# Patient Record
Sex: Female | Born: 1940
Health system: Southern US, Community
[De-identification: ages and names within clinical notes are randomized; demographics above are authoritative.]

## PROBLEM LIST (undated history)

## (undated) DIAGNOSIS — Z9114 Patient's other noncompliance with medication regimen: Secondary | ICD-10-CM

## (undated) DIAGNOSIS — I10 Essential (primary) hypertension: Secondary | ICD-10-CM

## (undated) DIAGNOSIS — F419 Anxiety disorder, unspecified: Secondary | ICD-10-CM

## (undated) DIAGNOSIS — R110 Nausea: Secondary | ICD-10-CM

## (undated) DIAGNOSIS — J189 Pneumonia, unspecified organism: Secondary | ICD-10-CM

## (undated) DIAGNOSIS — Z789 Other specified health status: Secondary | ICD-10-CM

## (undated) DIAGNOSIS — F32A Depression, unspecified: Secondary | ICD-10-CM

## (undated) DIAGNOSIS — M1990 Unspecified osteoarthritis, unspecified site: Secondary | ICD-10-CM

## (undated) DIAGNOSIS — Z8701 Personal history of pneumonia (recurrent): Secondary | ICD-10-CM

## (undated) DIAGNOSIS — K5909 Other constipation: Secondary | ICD-10-CM

## (undated) DIAGNOSIS — R0602 Shortness of breath: Secondary | ICD-10-CM

## (undated) DIAGNOSIS — G8929 Other chronic pain: Secondary | ICD-10-CM

## (undated) DIAGNOSIS — K219 Gastro-esophageal reflux disease without esophagitis: Secondary | ICD-10-CM

## (undated) DIAGNOSIS — W3400XA Accidental discharge from unspecified firearms or gun, initial encounter: Secondary | ICD-10-CM

## (undated) DIAGNOSIS — R109 Unspecified abdominal pain: Secondary | ICD-10-CM

## (undated) DIAGNOSIS — E785 Hyperlipidemia, unspecified: Secondary | ICD-10-CM

## (undated) DIAGNOSIS — Z91148 Patient's other noncompliance with medication regimen for other reason: Secondary | ICD-10-CM

## (undated) DIAGNOSIS — E119 Type 2 diabetes mellitus without complications: Secondary | ICD-10-CM

## (undated) DIAGNOSIS — R7303 Prediabetes: Secondary | ICD-10-CM

## (undated) DIAGNOSIS — F329 Major depressive disorder, single episode, unspecified: Secondary | ICD-10-CM

## (undated) DIAGNOSIS — M549 Dorsalgia, unspecified: Secondary | ICD-10-CM

## (undated) DIAGNOSIS — E7800 Pure hypercholesterolemia, unspecified: Secondary | ICD-10-CM

## (undated) HISTORY — PX: BREAST SURGERY: SHX581

## (undated) HISTORY — DX: Essential (primary) hypertension: I10

## (undated) HISTORY — PX: TOE SURGERY: SHX1073

## (undated) HISTORY — DX: Personal history of pneumonia (recurrent): Z87.01

## (undated) HISTORY — PX: CARDIAC CATHETERIZATION: SHX172

## (undated) HISTORY — DX: Hyperlipidemia, unspecified: E78.5

## (undated) HISTORY — DX: Prediabetes: R73.03

## (undated) HISTORY — DX: Type 2 diabetes mellitus without complications: E11.9

## (undated) HISTORY — PX: ABDOMINAL HYSTERECTOMY: SHX81

## (undated) HISTORY — PX: DILATION AND CURETTAGE OF UTERUS: SHX78

---

## 2000-10-29 ENCOUNTER — Ambulatory Visit (HOSPITAL_COMMUNITY): Admission: RE | Admit: 2000-10-29 | Discharge: 2000-10-29 | Payer: Self-pay | Admitting: Family Medicine

## 2000-10-29 ENCOUNTER — Encounter: Payer: Self-pay | Admitting: Family Medicine

## 2001-02-09 ENCOUNTER — Emergency Department (HOSPITAL_COMMUNITY): Admission: EM | Admit: 2001-02-09 | Discharge: 2001-02-09 | Payer: Self-pay | Admitting: *Deleted

## 2001-06-06 ENCOUNTER — Encounter: Payer: Self-pay | Admitting: Family Medicine

## 2001-06-06 ENCOUNTER — Ambulatory Visit (HOSPITAL_COMMUNITY): Admission: RE | Admit: 2001-06-06 | Discharge: 2001-06-06 | Payer: Self-pay | Admitting: Family Medicine

## 2001-08-11 ENCOUNTER — Ambulatory Visit (HOSPITAL_COMMUNITY): Admission: RE | Admit: 2001-08-11 | Discharge: 2001-08-11 | Payer: Self-pay | Admitting: Family Medicine

## 2001-08-11 ENCOUNTER — Encounter: Payer: Self-pay | Admitting: Family Medicine

## 2002-08-13 ENCOUNTER — Ambulatory Visit (HOSPITAL_COMMUNITY): Admission: RE | Admit: 2002-08-13 | Discharge: 2002-08-13 | Payer: Self-pay | Admitting: Family Medicine

## 2002-08-13 ENCOUNTER — Encounter: Payer: Self-pay | Admitting: Family Medicine

## 2003-08-16 ENCOUNTER — Ambulatory Visit (HOSPITAL_COMMUNITY): Admission: RE | Admit: 2003-08-16 | Discharge: 2003-08-16 | Payer: Self-pay | Admitting: Family Medicine

## 2004-04-28 ENCOUNTER — Emergency Department (HOSPITAL_COMMUNITY): Admission: EM | Admit: 2004-04-28 | Discharge: 2004-04-28 | Payer: Self-pay | Admitting: Emergency Medicine

## 2004-05-21 ENCOUNTER — Emergency Department (HOSPITAL_COMMUNITY): Admission: EM | Admit: 2004-05-21 | Discharge: 2004-05-22 | Payer: Self-pay | Admitting: Emergency Medicine

## 2004-05-22 ENCOUNTER — Emergency Department (HOSPITAL_COMMUNITY): Admission: EM | Admit: 2004-05-22 | Discharge: 2004-05-22 | Payer: Self-pay | Admitting: Emergency Medicine

## 2004-10-16 ENCOUNTER — Ambulatory Visit (HOSPITAL_COMMUNITY): Admission: RE | Admit: 2004-10-16 | Discharge: 2004-10-16 | Payer: Self-pay | Admitting: Family Medicine

## 2005-03-27 ENCOUNTER — Emergency Department (HOSPITAL_COMMUNITY): Admission: EM | Admit: 2005-03-27 | Discharge: 2005-03-27 | Payer: Self-pay | Admitting: Emergency Medicine

## 2005-06-05 ENCOUNTER — Emergency Department (HOSPITAL_COMMUNITY): Admission: EM | Admit: 2005-06-05 | Discharge: 2005-06-05 | Payer: Self-pay | Admitting: Emergency Medicine

## 2005-06-21 ENCOUNTER — Encounter (HOSPITAL_COMMUNITY): Admission: RE | Admit: 2005-06-21 | Discharge: 2005-06-29 | Payer: Self-pay | Admitting: Family Medicine

## 2005-07-03 ENCOUNTER — Encounter (HOSPITAL_COMMUNITY): Admission: RE | Admit: 2005-07-03 | Discharge: 2005-07-23 | Payer: Self-pay | Admitting: Family Medicine

## 2005-10-22 ENCOUNTER — Ambulatory Visit (HOSPITAL_COMMUNITY): Admission: RE | Admit: 2005-10-22 | Discharge: 2005-10-22 | Payer: Self-pay | Admitting: Family Medicine

## 2006-02-24 ENCOUNTER — Emergency Department (HOSPITAL_COMMUNITY): Admission: EM | Admit: 2006-02-24 | Discharge: 2006-02-24 | Payer: Self-pay | Admitting: Emergency Medicine

## 2006-09-11 ENCOUNTER — Emergency Department (HOSPITAL_COMMUNITY): Admission: EM | Admit: 2006-09-11 | Discharge: 2006-09-11 | Payer: Self-pay | Admitting: Emergency Medicine

## 2006-10-25 ENCOUNTER — Ambulatory Visit (HOSPITAL_COMMUNITY): Admission: RE | Admit: 2006-10-25 | Discharge: 2006-10-25 | Payer: Self-pay | Admitting: Family Medicine

## 2007-04-16 ENCOUNTER — Ambulatory Visit (HOSPITAL_COMMUNITY): Admission: RE | Admit: 2007-04-16 | Discharge: 2007-04-16 | Payer: Self-pay | Admitting: Internal Medicine

## 2007-04-16 ENCOUNTER — Ambulatory Visit: Payer: Self-pay | Admitting: Internal Medicine

## 2007-04-16 HISTORY — PX: COLONOSCOPY: SHX174

## 2007-05-10 ENCOUNTER — Ambulatory Visit (HOSPITAL_COMMUNITY): Admission: RE | Admit: 2007-05-10 | Discharge: 2007-05-10 | Payer: Self-pay | Admitting: Family Medicine

## 2007-05-16 ENCOUNTER — Emergency Department (HOSPITAL_COMMUNITY): Admission: EM | Admit: 2007-05-16 | Discharge: 2007-05-16 | Payer: Self-pay | Admitting: Emergency Medicine

## 2007-05-30 ENCOUNTER — Emergency Department (HOSPITAL_COMMUNITY): Admission: EM | Admit: 2007-05-30 | Discharge: 2007-05-30 | Payer: Self-pay | Admitting: Emergency Medicine

## 2007-07-26 ENCOUNTER — Emergency Department (HOSPITAL_COMMUNITY): Admission: EM | Admit: 2007-07-26 | Discharge: 2007-07-26 | Payer: Self-pay | Admitting: Emergency Medicine

## 2007-11-11 ENCOUNTER — Ambulatory Visit (HOSPITAL_COMMUNITY): Admission: RE | Admit: 2007-11-11 | Discharge: 2007-11-11 | Payer: Self-pay | Admitting: Family Medicine

## 2008-04-02 ENCOUNTER — Emergency Department (HOSPITAL_COMMUNITY): Admission: EM | Admit: 2008-04-02 | Discharge: 2008-04-02 | Payer: Self-pay | Admitting: Emergency Medicine

## 2008-04-10 ENCOUNTER — Emergency Department (HOSPITAL_COMMUNITY): Admission: EM | Admit: 2008-04-10 | Discharge: 2008-04-10 | Payer: Self-pay | Admitting: Emergency Medicine

## 2008-10-14 ENCOUNTER — Emergency Department (HOSPITAL_COMMUNITY): Admission: EM | Admit: 2008-10-14 | Discharge: 2008-10-14 | Payer: Self-pay | Admitting: Emergency Medicine

## 2008-12-08 ENCOUNTER — Ambulatory Visit (HOSPITAL_COMMUNITY): Admission: RE | Admit: 2008-12-08 | Discharge: 2008-12-08 | Payer: Self-pay | Admitting: Family Medicine

## 2009-06-09 ENCOUNTER — Emergency Department (HOSPITAL_COMMUNITY): Admission: EM | Admit: 2009-06-09 | Discharge: 2009-06-09 | Payer: Self-pay | Admitting: Emergency Medicine

## 2009-10-25 ENCOUNTER — Emergency Department (HOSPITAL_COMMUNITY): Admission: EM | Admit: 2009-10-25 | Discharge: 2009-10-25 | Payer: Self-pay | Admitting: Emergency Medicine

## 2009-12-13 ENCOUNTER — Ambulatory Visit (HOSPITAL_COMMUNITY): Admission: RE | Admit: 2009-12-13 | Discharge: 2009-12-13 | Payer: Self-pay | Admitting: Family Medicine

## 2010-04-20 ENCOUNTER — Ambulatory Visit (HOSPITAL_COMMUNITY): Admission: RE | Admit: 2010-04-20 | Discharge: 2010-04-20 | Payer: Self-pay | Admitting: Podiatry

## 2010-07-16 ENCOUNTER — Inpatient Hospital Stay (HOSPITAL_COMMUNITY): Admission: EM | Admit: 2010-07-16 | Discharge: 2010-07-18 | Payer: Self-pay | Source: Home / Self Care

## 2010-07-17 ENCOUNTER — Encounter (INDEPENDENT_AMBULATORY_CARE_PROVIDER_SITE_OTHER): Payer: Self-pay | Admitting: Internal Medicine

## 2010-07-17 LAB — DIFFERENTIAL
Basophils Absolute: 0 10*3/uL (ref 0.0–0.1)
Basophils Relative: 1 % (ref 0–1)
Eosinophils Absolute: 0.1 10*3/uL (ref 0.0–0.7)
Eosinophils Relative: 2 % (ref 0–5)
Lymphocytes Relative: 28 % (ref 12–46)
Lymphs Abs: 1.3 10*3/uL (ref 0.7–4.0)
Monocytes Absolute: 0.3 10*3/uL (ref 0.1–1.0)
Monocytes Relative: 6 % (ref 3–12)
Neutro Abs: 2.9 10*3/uL (ref 1.7–7.7)
Neutrophils Relative %: 63 % (ref 43–77)

## 2010-07-17 LAB — CBC
HCT: 35.4 % — ABNORMAL LOW (ref 36.0–46.0)
Hemoglobin: 12.1 g/dL (ref 12.0–15.0)
MCH: 30.5 pg (ref 26.0–34.0)
MCHC: 34.2 g/dL (ref 30.0–36.0)
MCV: 89.2 fL (ref 78.0–100.0)
Platelets: 477 10*3/uL — ABNORMAL HIGH (ref 150–400)
RBC: 3.97 MIL/uL (ref 3.87–5.11)
RDW: 13.1 % (ref 11.5–15.5)
WBC: 4.6 10*3/uL (ref 4.0–10.5)

## 2010-07-17 LAB — URINALYSIS, ROUTINE W REFLEX MICROSCOPIC
Bilirubin Urine: NEGATIVE
Ketones, ur: NEGATIVE mg/dL
Leukocytes, UA: NEGATIVE
Nitrite: NEGATIVE
Protein, ur: NEGATIVE mg/dL
Specific Gravity, Urine: 1.015 (ref 1.005–1.030)
Urine Glucose, Fasting: NEGATIVE mg/dL
Urobilinogen, UA: 0.2 mg/dL (ref 0.0–1.0)
pH: 7 (ref 5.0–8.0)

## 2010-07-17 LAB — BASIC METABOLIC PANEL
BUN: 16 mg/dL (ref 6–23)
CO2: 20 mEq/L (ref 19–32)
Calcium: 9 mg/dL (ref 8.4–10.5)
Chloride: 105 mEq/L (ref 96–112)
Creatinine, Ser: 1.12 mg/dL (ref 0.4–1.2)
GFR calc Af Amer: 59 mL/min — ABNORMAL LOW (ref >60)
GFR calc non Af Amer: 49 mL/min — ABNORMAL LOW (ref >60)
Glucose, Bld: 134 mg/dL — ABNORMAL HIGH (ref 70–99)
Potassium: 3.1 mEq/L — ABNORMAL LOW (ref 3.5–5.1)
Sodium: 139 mEq/L (ref 135–145)

## 2010-07-17 LAB — URINE MICROSCOPIC-ADD ON

## 2010-07-17 LAB — POCT CARDIAC MARKERS
CKMB, poc: <1 ng/mL — ABNORMAL LOW (ref 1.0–8.0)
Myoglobin, poc: 120 ng/mL (ref 12–200)
Troponin i, poc: <0.05 ng/mL (ref 0.00–0.09)

## 2010-07-17 LAB — D-DIMER, QUANTITATIVE: D-Dimer, Quant: 1.31 ug/mL-FEU — ABNORMAL HIGH (ref 0.00–0.48)

## 2010-07-19 LAB — VITAMIN B12: Vitamin B-12: 791 pg/mL (ref 211–911)

## 2010-07-19 LAB — MRSA PCR SCREENING: MRSA by PCR: POSITIVE — AB

## 2010-07-19 LAB — IRON AND TIBC
Iron: 72 ug/dL (ref 42–135)
Saturation Ratios: 27 % (ref 20–55)
TIBC: 271 ug/dL (ref 250–470)
UIBC: 199 ug/dL

## 2010-07-19 LAB — DIFFERENTIAL
Basophils Absolute: 0.1 10*3/uL (ref 0.0–0.1)
Basophils Relative: 1 % (ref 0–1)
Eosinophils Absolute: 0.2 10*3/uL (ref 0.0–0.7)
Eosinophils Relative: 3 % (ref 0–5)
Lymphocytes Relative: 45 % (ref 12–46)
Lymphs Abs: 2.5 10*3/uL (ref 0.7–4.0)
Monocytes Absolute: 0.6 10*3/uL (ref 0.1–1.0)
Monocytes Relative: 11 % (ref 3–12)
Neutro Abs: 2.2 10*3/uL (ref 1.7–7.7)
Neutrophils Relative %: 40 % — ABNORMAL LOW (ref 43–77)

## 2010-07-19 LAB — URINE CULTURE
Colony Count: NO GROWTH
Culture  Setup Time: 201201151949
Culture: NO GROWTH

## 2010-07-19 LAB — CBC
HCT: 32.2 % — ABNORMAL LOW (ref 36.0–46.0)
Hemoglobin: 11 g/dL — ABNORMAL LOW (ref 12.0–15.0)
MCH: 30.4 pg (ref 26.0–34.0)
MCHC: 34.2 g/dL (ref 30.0–36.0)
MCV: 89 fL (ref 78.0–100.0)
Platelets: 449 10*3/uL — ABNORMAL HIGH (ref 150–400)
RBC: 3.62 MIL/uL — ABNORMAL LOW (ref 3.87–5.11)
RDW: 13 % (ref 11.5–15.5)
WBC: 5.6 10*3/uL (ref 4.0–10.5)

## 2010-07-19 LAB — FERRITIN: Ferritin: 146 ng/mL (ref 10–291)

## 2010-07-19 LAB — FOLATE: Folate: 17.2 ng/mL

## 2010-07-19 LAB — TSH: TSH: 2.821 u[IU]/mL (ref 0.350–4.500)

## 2010-07-24 ENCOUNTER — Encounter: Payer: Self-pay | Admitting: Family Medicine

## 2010-09-13 LAB — BASIC METABOLIC PANEL
BUN: 13 mg/dL (ref 6–23)
CO2: 24 mEq/L (ref 19–32)
Calcium: 9.7 mg/dL (ref 8.4–10.5)
Chloride: 109 mEq/L (ref 96–112)
Creatinine, Ser: 1.42 mg/dL — ABNORMAL HIGH (ref 0.4–1.2)
GFR calc Af Amer: 45 mL/min — ABNORMAL LOW (ref >60)
GFR calc non Af Amer: 37 mL/min — ABNORMAL LOW (ref >60)
Glucose, Bld: 136 mg/dL — ABNORMAL HIGH (ref 70–99)
Potassium: 3.8 mEq/L (ref 3.5–5.1)
Sodium: 140 mEq/L (ref 135–145)

## 2010-09-13 LAB — HEMOGLOBIN AND HEMATOCRIT, BLOOD
HCT: 36.9 % (ref 36.0–46.0)
Hemoglobin: 12.7 g/dL (ref 12.0–15.0)

## 2010-09-13 LAB — SURGICAL PCR SCREEN: Staphylococcus aureus: POSITIVE — AB

## 2010-09-13 LAB — GLUCOSE, CAPILLARY: Glucose-Capillary: 93 mg/dL (ref 70–99)

## 2010-09-19 LAB — DIFFERENTIAL
Basophils Absolute: 0 10*3/uL (ref 0.0–0.1)
Basophils Relative: 1 % (ref 0–1)
Eosinophils Absolute: 0.1 10*3/uL (ref 0.0–0.7)
Eosinophils Relative: 1 % (ref 0–5)
Lymphocytes Relative: 34 % (ref 12–46)
Lymphs Abs: 2 10*3/uL (ref 0.7–4.0)
Monocytes Absolute: 0.6 10*3/uL (ref 0.1–1.0)
Monocytes Relative: 10 % (ref 3–12)
Neutro Abs: 3.2 10*3/uL (ref 1.7–7.7)
Neutrophils Relative %: 54 % (ref 43–77)

## 2010-09-19 LAB — POCT CARDIAC MARKERS
CKMB, poc: <1 ng/mL — ABNORMAL LOW (ref 1.0–8.0)
CKMB, poc: <1 ng/mL — ABNORMAL LOW (ref 1.0–8.0)
Myoglobin, poc: 62.9 ng/mL (ref 12–200)
Myoglobin, poc: 68.2 ng/mL (ref 12–200)
Troponin i, poc: <0.05 ng/mL (ref 0.00–0.09)
Troponin i, poc: <0.05 ng/mL (ref 0.00–0.09)

## 2010-09-19 LAB — CBC
HCT: 36.7 % (ref 36.0–46.0)
Hemoglobin: 13 g/dL (ref 12.0–15.0)
MCHC: 35.5 g/dL (ref 30.0–36.0)
MCV: 89.7 fL (ref 78.0–100.0)
Platelets: 414 10*3/uL — ABNORMAL HIGH (ref 150–400)
RBC: 4.09 MIL/uL (ref 3.87–5.11)
RDW: 13.1 % (ref 11.5–15.5)
WBC: 5.9 10*3/uL (ref 4.0–10.5)

## 2010-09-19 LAB — URINALYSIS, ROUTINE W REFLEX MICROSCOPIC
Bilirubin Urine: NEGATIVE
Glucose, UA: NEGATIVE mg/dL
Hgb urine dipstick: NEGATIVE
Ketones, ur: NEGATIVE mg/dL
Nitrite: NEGATIVE
Protein, ur: NEGATIVE mg/dL
Specific Gravity, Urine: 1.02 (ref 1.005–1.030)
Urobilinogen, UA: 1 mg/dL (ref 0.0–1.0)
pH: 7.5 (ref 5.0–8.0)

## 2010-09-19 LAB — BASIC METABOLIC PANEL
BUN: 14 mg/dL (ref 6–23)
CO2: 25 mEq/L (ref 19–32)
Calcium: 10.3 mg/dL (ref 8.4–10.5)
Chloride: 109 mEq/L (ref 96–112)
Creatinine, Ser: 1.32 mg/dL — ABNORMAL HIGH (ref 0.4–1.2)
GFR calc Af Amer: 49 mL/min — ABNORMAL LOW (ref >60)
GFR calc non Af Amer: 40 mL/min — ABNORMAL LOW (ref >60)
Glucose, Bld: 91 mg/dL (ref 70–99)
Potassium: 4.3 mEq/L (ref 3.5–5.1)
Sodium: 141 mEq/L (ref 135–145)

## 2010-09-19 LAB — D-DIMER, QUANTITATIVE (NOT AT ARMC): D-Dimer, Quant: 0.68 ug/mL-FEU — ABNORMAL HIGH (ref 0.00–0.48)

## 2010-10-03 LAB — URINALYSIS, ROUTINE W REFLEX MICROSCOPIC
Glucose, UA: NEGATIVE mg/dL
Hgb urine dipstick: NEGATIVE
Leukocytes, UA: NEGATIVE
Nitrite: NEGATIVE
Specific Gravity, Urine: 1.025 (ref 1.005–1.030)
Urobilinogen, UA: 1 mg/dL (ref 0.0–1.0)
pH: 5.5 (ref 5.0–8.0)

## 2010-10-03 LAB — URINE MICROSCOPIC-ADD ON

## 2010-10-11 LAB — COMPREHENSIVE METABOLIC PANEL
ALT: 25 U/L (ref 0–35)
AST: 28 U/L (ref 0–37)
Albumin: 3.6 g/dL (ref 3.5–5.2)
Alkaline Phosphatase: 73 U/L (ref 39–117)
BUN: 16 mg/dL (ref 6–23)
CO2: 26 mEq/L (ref 19–32)
Calcium: 10 mg/dL (ref 8.4–10.5)
Chloride: 107 mEq/L (ref 96–112)
Creatinine, Ser: 1.27 mg/dL — ABNORMAL HIGH (ref 0.4–1.2)
GFR calc Af Amer: 51 mL/min — ABNORMAL LOW (ref >60)
GFR calc non Af Amer: 42 mL/min — ABNORMAL LOW (ref >60)
Glucose, Bld: 99 mg/dL (ref 70–99)
Potassium: 4.4 mEq/L (ref 3.5–5.1)
Sodium: 143 mEq/L (ref 135–145)
Total Bilirubin: 0.7 mg/dL (ref 0.3–1.2)
Total Protein: 6.7 g/dL (ref 6.0–8.3)

## 2010-10-11 LAB — DIFFERENTIAL
Basophils Absolute: 0.2 10*3/uL — ABNORMAL HIGH (ref 0.0–0.1)
Basophils Relative: 3 % — ABNORMAL HIGH (ref 0–1)
Eosinophils Absolute: 0.1 10*3/uL (ref 0.0–0.7)
Eosinophils Relative: 2 % (ref 0–5)
Lymphocytes Relative: 35 % (ref 12–46)
Lymphs Abs: 1.8 10*3/uL (ref 0.7–4.0)
Monocytes Absolute: 0.5 10*3/uL (ref 0.1–1.0)
Monocytes Relative: 10 % (ref 3–12)
Neutro Abs: 2.6 10*3/uL (ref 1.7–7.7)
Neutrophils Relative %: 50 % (ref 43–77)

## 2010-10-11 LAB — CBC
HCT: 36.3 % (ref 36.0–46.0)
Hemoglobin: 12.7 g/dL (ref 12.0–15.0)
MCHC: 35 g/dL (ref 30.0–36.0)
MCV: 89.7 fL (ref 78.0–100.0)
Platelets: 373 10*3/uL (ref 150–400)
RBC: 4.05 MIL/uL (ref 3.87–5.11)
RDW: 13.6 % (ref 11.5–15.5)
WBC: 5.3 10*3/uL (ref 4.0–10.5)

## 2010-10-11 LAB — D-DIMER, QUANTITATIVE: D-Dimer, Quant: 0.5 ug/mL-FEU — ABNORMAL HIGH (ref 0.00–0.48)

## 2010-10-11 LAB — POCT CARDIAC MARKERS
CKMB, poc: <1 ng/mL — ABNORMAL LOW (ref 1.0–8.0)
Myoglobin, poc: 62.8 ng/mL (ref 12–200)
Troponin i, poc: <0.05 ng/mL (ref 0.00–0.09)

## 2010-11-14 NOTE — Op Note (Signed)
NAMEYETZALI, WELD           ACCOUNT NO.:  [id number]   MEDICAL RECORD NO.:  [id number]          PATIENT TYPE:  AMB   LOCATION:  DAY                           FACILITY:  [facility]   PHYSICIAN:  R. Roetta Sessions, M.D. DATE OF BIRTH:  Feb 16, 1941   DATE OF PROCEDURE:  04/16/2007  DATE OF DISCHARGE:                               OPERATIVE REPORT   PROCEDURE:  Screening colonoscopy.   INDICATIONS FOR PROCEDURE:  A 70 year old African-American female with  no lower GI tract symptoms who comes to see me for screening colonoscopy  at the insistence of her granddaughter.  She, again, has no lower GI  tract symptoms.  There is no family history of colorectal hyperplasia  and she has never had her lower GI tract imaged.  Colonoscopy is now  being done as standard screening maneuver.  This approach has been  discussed the patient at length.  Potential risks, benefits and  alternatives have been reviewed.  The patient's questions were answered.  She is agreeable.   PROCEDURE NOTE:  O2 saturation, blood pressure, pulse and respirations  were monitored throughout the entire procedure.  Conscious sedation:  Versed 3 mg IV, Demerol 75 mg IV in divided doses.  Instrument:  Pentax  video chip system.   FINDINGS:  Digital rectal exam revealed no abnormalities.  The prep was  good.  Colon:  Colonic mucosa was surveyed from rectosigmoid junction  through the left transverse right colon to the appendiceal orifice,  ileocecal valve and cecum.  These structures were well seen,  photographed for the record.  From this level, scope was slowly and  cautiously withdrawn.  All previously mentioned mucosal surfaces were  again seen.  Utilizing tip deflection and fold flattening, all mucosal  surfaces of the colon were seen and appeared normal.  Scope was pulled  down into the rectum where thorough examination of the rectal mucosa,  retroflexed view of the anal verge demonstrated no abnormalities.  The  patient tolerated the procedure well and was reactive after endoscopy.   IMPRESSION:  1. Normal rectum.  2. Normal colon.   RECOMMENDATIONS:  Repeat screening colonoscopy 10 years.      Jonathon Bellows, M.D.  Electronically Signed     RMR/MEDQ  D:  04/16/2007  T:  04/16/2007  Job:  [id number]   cc:   Melvyn Novas, MD  Fax: [phone]

## 2010-12-11 ENCOUNTER — Other Ambulatory Visit (HOSPITAL_COMMUNITY): Payer: Self-pay | Admitting: Family Medicine

## 2010-12-11 DIAGNOSIS — Z139 Encounter for screening, unspecified: Secondary | ICD-10-CM

## 2011-01-29 ENCOUNTER — Ambulatory Visit (HOSPITAL_COMMUNITY)
Admission: RE | Admit: 2011-01-29 | Discharge: 2011-01-29 | Disposition: A | Discharge status: Home / Self Care | Payer: Medicare Other | Source: Ambulatory Visit | Attending: Family Medicine | Admitting: Family Medicine

## 2011-01-29 DIAGNOSIS — Z1231 Encounter for screening mammogram for malignant neoplasm of breast: Secondary | ICD-10-CM | POA: Insufficient documentation

## 2011-01-29 DIAGNOSIS — Z139 Encounter for screening, unspecified: Secondary | ICD-10-CM

## 2011-03-09 ENCOUNTER — Emergency Department (HOSPITAL_COMMUNITY)
Admission: EM | Admit: 2011-03-09 | Discharge: 2011-03-09 | Disposition: A | Discharge status: Home / Self Care | Payer: Medicare Other | Attending: Emergency Medicine | Admitting: Emergency Medicine

## 2011-03-09 ENCOUNTER — Encounter: Payer: Self-pay | Admitting: *Deleted

## 2011-03-09 ENCOUNTER — Emergency Department (HOSPITAL_COMMUNITY): Payer: Medicare Other

## 2011-03-09 DIAGNOSIS — I10 Essential (primary) hypertension: Secondary | ICD-10-CM | POA: Insufficient documentation

## 2011-03-09 DIAGNOSIS — M545 Low back pain, unspecified: Secondary | ICD-10-CM | POA: Insufficient documentation

## 2011-03-09 DIAGNOSIS — E7800 Pure hypercholesterolemia, unspecified: Secondary | ICD-10-CM | POA: Insufficient documentation

## 2011-03-09 DIAGNOSIS — F411 Generalized anxiety disorder: Secondary | ICD-10-CM | POA: Insufficient documentation

## 2011-03-09 HISTORY — DX: Other chronic pain: G89.29

## 2011-03-09 HISTORY — DX: Anxiety disorder, unspecified: F41.9

## 2011-03-09 HISTORY — DX: Essential (primary) hypertension: I10

## 2011-03-09 HISTORY — DX: Pure hypercholesterolemia, unspecified: E78.00

## 2011-03-09 HISTORY — DX: Dorsalgia, unspecified: M54.9

## 2011-03-09 MED ORDER — HYDROCODONE-ACETAMINOPHEN 5-325 MG PO TABS: ORAL_TABLET | ORAL | Status: AC

## 2011-03-09 MED ORDER — METHOCARBAMOL 500 MG PO TABS: 1000.0000 mg | ORAL_TABLET | Freq: Four times a day (QID) | ORAL | Status: AC | PRN

## 2011-03-09 NOTE — ED Provider Notes (Signed)
Scribed for Laray Anger, DO, the patient was seen in room APA17. . This chart was scribed by Ellie Lunch. This patient's care was started at 8:47 AM.   CSN: [id number] Arrival date & time: 03/09/2011  8:45 AM  Chief Complaint  Patient presents with  . Back Pain   HPI Patient was seen at 8:50. Audrey Johnson is a 70 y.o. female with a history of "back problems" presents to the Emergency Department complaining of gradual onset and persistence of constant left sided lower back "pain" starting 2 weeks ago. Pain described as a constant, aching pain that is aggravated by movement. Pt denies falling or any recent injury to her back. Pt went to her PCP Dr. Loleta Chance one week ago and reports he gave her a prescription  that has not helped. She is not sure what the prescribed medicine is. Pt has a follow up appointment in 4 weeks. There are no other associated symptoms and no other alleviating or aggravating factors.  Denies fevers, no abd pain, no N/V/D, no rash, no CP/SOB, no focal motor weakness, no tingling/numbness in extremities, no flank pain, no saddle anesthesia, no incont/retention of bowel or bladder, no dysuria/hematuria.     Past Medical History  Diagnosis Date  . Hypertension   . High cholesterol   . Chronic back pain   . Anxiety     Past Surgical History  Procedure Date  . Abdominal hysterectomy   . Toe surgery    MEDICATIONS:  Previous Medications   OLMESARTAN-HYDROCHLOROTHIAZIDE (BENICAR HCT) 40-25 MG PER TABLET    Take 1 tablet by mouth daily.     PRAVASTATIN (PRAVACHOL) 20 MG TABLET    Take 20 mg by mouth daily.       ALLERGIES:  Allergies as of 03/09/2011  . (No Known Allergies)    History  Substance Use Topics  . Smoking status: Never Smoker   . Smokeless tobacco: Not on file  . Alcohol Use: No    Review of Systems ROS: Statement: All systems negative except as marked or noted in the HPI; Constitutional: Negative for fever and chills. ; ; Eyes: Negative  for eye pain, redness and discharge. ; ; ENMT: Negative for ear pain, hoarseness, nasal congestion, sinus pressure and sore throat. ; ; Cardiovascular: Negative for chest pain, palpitations, diaphoresis, dyspnea and peripheral edema. ; ; Respiratory: Negative for cough, wheezing and stridor. ; ; Gastrointestinal: Negative for nausea, vomiting, diarrhea and abdominal pain, blood in stool, hematemesis, jaundice and rectal bleeding. . ; ; Genitourinary: Negative for dysuria, flank pain and hematuria. ; ; Musculoskeletal: +LBP.  Negative for neck pain. Negative for swelling and trauma.; ; Skin: Negative for pruritus, rash, abrasions, blisters, bruising and skin lesion.; ; Neuro: Negative for headache, lightheadedness and neck stiffness. Negative for weakness, altered level of consciousness , altered mental status, extremity weakness, paresthesias, involuntary movement, seizure and syncope.    Physical Exam  BP 119/61  Pulse 83  Temp(Src) 98.6 F (37 C) (Oral)  Resp 20  Ht 5\' 5"  (1.651 m)  Wt 160 lb (72.576 kg)  BMI 26.63 kg/m2  SpO2 99%  Physical Exam 0855: Physical examination:  Nursing notes reviewed; Vital signs and O2 SAT reviewed;  Constitutional: Well developed, Well nourished, Well hydrated, In no acute distress; Head:  Normocephalic, atraumatic; Eyes: EOMI, PERRL, No scleral icterus; ENMT: Mouth and pharynx normal, Mucous membranes moist; Neck: Supple, Full range of motion, No lymphadenopathy; Cardiovascular: Regular rate and rhythm, No murmur, rub, or gallop;  Respiratory: Breath sounds clear & equal bilaterally, No rales, rhonchi, wheezes, or rub, Normal respiratory effort/excursion; Chest: Nontender, Movement normal; Abdomen: Soft, Nontender, Nondistended, Normal bowel sounds; Genitourinary: No CVA tenderness; Extremities: Pulses normal, No tenderness, No edema, No calf edema or asymmetry.; Neuro: AA&Ox3, Major CN grossly intact.  Strength 5/5 equal bilat UE's and LE's, including great toe  dorsiflexion.  DTR 2/4 equal bilat UE's and LE's.  No gross sensory deficits.  Neg straight leg raises bilat.  Gait steady.  Skin: Color normal, Warm, Dry.  Spine:  No midline CS, TS, LS tenderness.  +TTP left lower lumbar paraspinal muscles.    ED Course  Procedures  MDM MDM Reviewed: nursing note and vitals Interpretation: x-ray   Dg Lumbar Spine Complete  03/09/2011  *RADIOLOGY REPORT*  Clinical Data: 70 year old female with low back pain times 2 weeks.  LUMBAR SPINE - COMPLETE 4+ VIEW  Comparison: Lumbar MRI 05/10/2007.  Lumbar radiographs 06/05/2005.  Findings: Normal lumbar segmentation.  Stable vertebral height and alignment.  Chronic right gonadal vein phlebolith is prominent and projects over the right L5 transverse process.  No pars fracture. Severe disc degeneration at L1-L2, now with vacuum disc phenomena. Stable and relatively preserved disc spaces elsewhere.  Moderate L5- S1 facet hypertrophy greater on the right.  Sacrum and SI joints within normal limits.  IMPRESSION: 1. No acute osseous abnormality in the lumbar spine. 2.  Advanced L1-L2 disc degeneration, progressed since 2008. 3.  Incidental chronic large right gonadal vein phlebolith at the L5 level.  Original Report Authenticated By: Harley Hallmark, M.D.    10:40 AM:  Dx testing d/w pt.  Questions answered.  Verb understanding, agreeable to d/c home with outpt f/u.   [facility]   I personally performed the services described in this documentation, which was scribed in my presence. The recorded information has been reviewed and considered. Agueda Houpt Allison Quarry, DO 03/10/11 1530

## 2011-03-09 NOTE — ED Notes (Signed)
Reports hx of "back problems"; c/o increased pain x 2 weeks to lower back; was seen by PCP 1 week ago and Rx medicine, which pt states is not helping

## 2011-04-02 LAB — URINALYSIS, ROUTINE W REFLEX MICROSCOPIC
Glucose, UA: NEGATIVE
Ketones, ur: NEGATIVE
Leukocytes, UA: NEGATIVE
Nitrite: NEGATIVE
Protein, ur: NEGATIVE
Specific Gravity, Urine: 1.02
Urobilinogen, UA: 0.2
pH: 6

## 2011-04-02 LAB — BASIC METABOLIC PANEL
BUN: 19
CO2: 22
Calcium: 9.7
Chloride: 108
Creatinine, Ser: 1.29 — ABNORMAL HIGH
GFR calc Af Amer: 50 — ABNORMAL LOW
GFR calc non Af Amer: 41 — ABNORMAL LOW
Glucose, Bld: 108 — ABNORMAL HIGH
Potassium: 3.3 — ABNORMAL LOW
Sodium: 137

## 2011-04-02 LAB — CBC
HCT: 37.1
Hemoglobin: 12.8
MCHC: 34.4
MCV: 90.3
Platelets: 384
RBC: 4.11
RDW: 13.8
WBC: 5

## 2011-04-02 LAB — DIFFERENTIAL
Basophils Absolute: 0.1
Basophils Relative: 1
Eosinophils Absolute: 0.1
Eosinophils Relative: 1
Lymphocytes Relative: 37
Lymphs Abs: 1.8
Monocytes Absolute: 0.7
Monocytes Relative: 13 — ABNORMAL HIGH
Neutro Abs: 2.4
Neutrophils Relative %: 48

## 2011-04-02 LAB — URINE MICROSCOPIC-ADD ON

## 2011-07-26 DIAGNOSIS — N390 Urinary tract infection, site not specified: Secondary | ICD-10-CM | POA: Diagnosis not present

## 2011-07-26 DIAGNOSIS — R319 Hematuria, unspecified: Secondary | ICD-10-CM | POA: Diagnosis not present

## 2011-07-28 ENCOUNTER — Ambulatory Visit (HOSPITAL_COMMUNITY)
Admission: RE | Admit: 2011-07-28 | Discharge: 2011-07-28 | Disposition: A | Discharge status: Home / Self Care | Payer: Medicare Other | Source: Ambulatory Visit | Attending: Family Medicine | Admitting: Family Medicine

## 2011-07-28 ENCOUNTER — Other Ambulatory Visit (HOSPITAL_COMMUNITY): Payer: Self-pay | Admitting: Family Medicine

## 2011-07-28 ENCOUNTER — Other Ambulatory Visit: Payer: Self-pay | Admitting: Family Medicine

## 2011-07-28 DIAGNOSIS — M545 Low back pain, unspecified: Secondary | ICD-10-CM | POA: Diagnosis not present

## 2011-07-28 DIAGNOSIS — R1032 Left lower quadrant pain: Secondary | ICD-10-CM | POA: Insufficient documentation

## 2011-07-28 DIAGNOSIS — R109 Unspecified abdominal pain: Secondary | ICD-10-CM

## 2011-10-26 DIAGNOSIS — R799 Abnormal finding of blood chemistry, unspecified: Secondary | ICD-10-CM | POA: Diagnosis not present

## 2011-10-26 DIAGNOSIS — Z0000 Encounter for general adult medical examination without abnormal findings: Secondary | ICD-10-CM | POA: Diagnosis not present

## 2011-10-26 DIAGNOSIS — R5381 Other malaise: Secondary | ICD-10-CM | POA: Diagnosis not present

## 2011-10-26 DIAGNOSIS — E7800 Pure hypercholesterolemia, unspecified: Secondary | ICD-10-CM | POA: Diagnosis not present

## 2011-10-26 DIAGNOSIS — I10 Essential (primary) hypertension: Secondary | ICD-10-CM | POA: Diagnosis not present

## 2011-10-26 DIAGNOSIS — R5383 Other fatigue: Secondary | ICD-10-CM | POA: Diagnosis not present

## 2011-10-26 DIAGNOSIS — R7309 Other abnormal glucose: Secondary | ICD-10-CM | POA: Diagnosis not present

## 2011-11-02 DIAGNOSIS — I10 Essential (primary) hypertension: Secondary | ICD-10-CM | POA: Diagnosis not present

## 2011-11-02 DIAGNOSIS — E7800 Pure hypercholesterolemia, unspecified: Secondary | ICD-10-CM | POA: Diagnosis not present

## 2012-02-08 DIAGNOSIS — I10 Essential (primary) hypertension: Secondary | ICD-10-CM | POA: Diagnosis not present

## 2012-02-08 DIAGNOSIS — M549 Dorsalgia, unspecified: Secondary | ICD-10-CM | POA: Diagnosis not present

## 2012-02-08 DIAGNOSIS — E7800 Pure hypercholesterolemia, unspecified: Secondary | ICD-10-CM | POA: Diagnosis not present

## 2012-02-13 ENCOUNTER — Other Ambulatory Visit (HOSPITAL_COMMUNITY): Payer: Self-pay | Admitting: Internal Medicine

## 2012-02-13 DIAGNOSIS — Z139 Encounter for screening, unspecified: Secondary | ICD-10-CM

## 2012-02-18 ENCOUNTER — Ambulatory Visit (HOSPITAL_COMMUNITY)
Admission: RE | Admit: 2012-02-18 | Discharge: 2012-02-18 | Disposition: A | Discharge status: Home / Self Care | Payer: Medicare Other | Source: Ambulatory Visit | Attending: Internal Medicine | Admitting: Internal Medicine

## 2012-02-18 DIAGNOSIS — Z1231 Encounter for screening mammogram for malignant neoplasm of breast: Secondary | ICD-10-CM | POA: Diagnosis not present

## 2012-02-18 DIAGNOSIS — Z139 Encounter for screening, unspecified: Secondary | ICD-10-CM

## 2012-03-18 DIAGNOSIS — H04129 Dry eye syndrome of unspecified lacrimal gland: Secondary | ICD-10-CM | POA: Diagnosis not present

## 2012-03-18 DIAGNOSIS — H2510 Age-related nuclear cataract, unspecified eye: Secondary | ICD-10-CM | POA: Diagnosis not present

## 2012-03-18 DIAGNOSIS — H5200 Hypermetropia, unspecified eye: Secondary | ICD-10-CM | POA: Diagnosis not present

## 2012-03-18 DIAGNOSIS — H40019 Open angle with borderline findings, low risk, unspecified eye: Secondary | ICD-10-CM | POA: Diagnosis not present

## 2012-03-21 ENCOUNTER — Emergency Department (HOSPITAL_COMMUNITY)
Admission: EM | Admit: 2012-03-21 | Discharge: 2012-03-21 | Disposition: A | Discharge status: Home / Self Care | Payer: Medicare Other | Attending: Emergency Medicine | Admitting: Emergency Medicine

## 2012-03-21 ENCOUNTER — Encounter (HOSPITAL_COMMUNITY): Payer: Self-pay | Admitting: *Deleted

## 2012-03-21 ENCOUNTER — Emergency Department (HOSPITAL_COMMUNITY): Payer: Medicare Other

## 2012-03-21 DIAGNOSIS — G8929 Other chronic pain: Secondary | ICD-10-CM | POA: Insufficient documentation

## 2012-03-21 DIAGNOSIS — I10 Essential (primary) hypertension: Secondary | ICD-10-CM | POA: Insufficient documentation

## 2012-03-21 DIAGNOSIS — M47817 Spondylosis without myelopathy or radiculopathy, lumbosacral region: Secondary | ICD-10-CM | POA: Diagnosis not present

## 2012-03-21 DIAGNOSIS — M5137 Other intervertebral disc degeneration, lumbosacral region: Secondary | ICD-10-CM | POA: Diagnosis not present

## 2012-03-21 DIAGNOSIS — M48061 Spinal stenosis, lumbar region without neurogenic claudication: Secondary | ICD-10-CM | POA: Diagnosis not present

## 2012-03-21 DIAGNOSIS — E7800 Pure hypercholesterolemia, unspecified: Secondary | ICD-10-CM | POA: Insufficient documentation

## 2012-03-21 DIAGNOSIS — M545 Low back pain, unspecified: Secondary | ICD-10-CM | POA: Diagnosis not present

## 2012-03-21 DIAGNOSIS — F411 Generalized anxiety disorder: Secondary | ICD-10-CM | POA: Diagnosis not present

## 2012-03-21 DIAGNOSIS — M51379 Other intervertebral disc degeneration, lumbosacral region without mention of lumbar back pain or lower extremity pain: Secondary | ICD-10-CM | POA: Insufficient documentation

## 2012-03-21 DIAGNOSIS — M5410 Radiculopathy, site unspecified: Secondary | ICD-10-CM

## 2012-03-21 MED ORDER — KETOROLAC TROMETHAMINE 60 MG/2ML IM SOLN
60.0000 mg | Freq: Once | INTRAMUSCULAR | Status: AC
Start: 2012-03-21 — End: 2012-03-21
  Administered 2012-03-21: 60 mg via INTRAMUSCULAR
  Filled 2012-03-21: qty 2

## 2012-03-21 MED ORDER — PREDNISONE 10 MG PO TABS: 20.0000 mg | ORAL_TABLET | Freq: Two times a day (BID) | ORAL | Status: DC

## 2012-03-21 MED ORDER — HYDROCODONE-ACETAMINOPHEN 5-500 MG PO TABS: 1.0000 | ORAL_TABLET | Freq: Four times a day (QID) | ORAL | Status: DC | PRN

## 2012-03-21 MED ORDER — HYDROCODONE-ACETAMINOPHEN 5-325 MG PO TABS
2.0000 | ORAL_TABLET | Freq: Once | ORAL | Status: AC
  Administered 2012-03-21: 2 via ORAL
  Filled 2012-03-21: qty 2

## 2012-03-21 MED ORDER — CYCLOBENZAPRINE HCL 10 MG PO TABS: 10.0000 mg | ORAL_TABLET | Freq: Three times a day (TID) | ORAL | Status: DC | PRN

## 2012-03-21 NOTE — ED Notes (Signed)
Pt states has chronic back pain. Pt states pain radiates into rt leg. Pt states when back hurts "I get a shot here and it goes away". Pt denies injury and past surgery of back. Denies bladder bowel incontinence.

## 2012-03-21 NOTE — Discharge Instructions (Signed)

## 2012-03-21 NOTE — ED Notes (Signed)
Pain rt lower back and down rt leg, onset yesterday , No injury.

## 2012-03-21 NOTE — ED Notes (Signed)
MD at bedside. Dr Judd Lien examined pt and discussed plan of care.

## 2012-03-21 NOTE — ED Provider Notes (Signed)
History   This chart was scribed for Audrey Lyons, MD by Gerlean Ren. This patient was seen in room APA08/[facility]08 and the patient's care was started at 3:20PM.   CSN: [id number]  Arrival date & time 03/21/12  1434   First MD Initiated Contact with Patient 03/21/12 1510      Chief Complaint  Patient presents with  . Back Pain    (Consider location/radiation/quality/duration/timing/severity/associated sxs/prior treatment) Patient is a 71 y.o. female presenting with back pain. The history is provided by the patient. No language interpreter was used.  Back Pain    Audrey Johnson is a 71 y.o. female with a h/o chronic back pain who presents to the Emergency Department complaining of sudden onset, constant, gradually worsening lower right back pain radiating to right knee that pt denies was cause by any fall or trauma.  Pt has no h/o back surgery.  Pt reports normal strength in legs, but that pain prevents normal ambulation.  Pt denies any associated urinary or bowel symptoms.  Pt has h/o HTN and anxiety.  Pt denies tobacco and alcohol use.   Past Medical History  Diagnosis Date  . Hypertension   . High cholesterol   . Chronic back pain   . Anxiety     Past Surgical History  Procedure Date  . Abdominal hysterectomy   . Toe surgery     History reviewed. No pertinent family history.  History  Substance Use Topics  . Smoking status: Never Smoker   . Smokeless tobacco: Not on file  . Alcohol Use: No    No OB history provided.  Review of Systems  Musculoskeletal: Positive for back pain.  All other systems reviewed and are negative.    Allergies  Review of patient's allergies indicates no known allergies.  Home Medications   Current Outpatient Rx  Name Route Sig Dispense Refill  . IBUPROFEN 200 MG PO TABS Oral Take 400 mg by mouth every 8 (eight) hours as needed. Back pain     . OLMESARTAN MEDOXOMIL-HCTZ 40-25 MG PO TABS Oral Take 1 tablet by mouth daily.      Marland Kitchen  PRAVASTATIN SODIUM 20 MG PO TABS Oral Take 20 mg by mouth at bedtime.       BP 108/53  Pulse 76  Temp 99.1 F (37.3 C) (Oral)  Resp 18  Ht 5\' 4"  (1.626 m)  Wt 165 lb (74.844 kg)  BMI 28.32 kg/m2  SpO2 100%  Physical Exam  Nursing note and vitals reviewed. Constitutional: She is oriented to person, place, and time. She appears well-developed and well-nourished.  HENT:  Head: Normocephalic and atraumatic.  Eyes: Conjunctivae normal and EOM are normal.  Neck: Normal range of motion. No tracheal deviation present.  Cardiovascular: Normal rate, regular rhythm and normal heart sounds.   No murmur heard. Pulmonary/Chest: Effort normal and breath sounds normal. She has no wheezes.  Abdominal: Soft. Bowel sounds are normal. There is no tenderness.  Musculoskeletal:       Tender to palpation in right lumbar region.  Neurological: She is alert and oriented to person, place, and time.       DTRs are 1+ and equal in bilateral lower extremities.   Normal strength in bilateral lower extremities.    Skin: Skin is warm and dry.  Psychiatric: She has a normal mood and affect.    ED Course  Procedures (including critical care time) DIAGNOSTIC STUDIES: Oxygen Saturation is 100% on room air, normal by my interpretation.  COORDINATION OF CARE: 3:27PM- Ordered Toradol, Vicodin, lumbar spine CT.    Labs Reviewed - No data to display No results found.  Dg Lumbar Spine Complete  03/21/2012  *RADIOLOGY REPORT*  Clinical Data: Low back pain radiating into the right leg over the past 2-3 days.  No recent injuries.  LUMBAR SPINE - COMPLETE 4+ VIEW  Comparison: Lumbar spine x-rays 03/09/2011.  Lumbar spine MRI 05/10/2007.  Findings: Five non-rib bearing lumbar vertebrae with anatomic alignment.  No fractures.  Progressive disc space narrowing endplate hypertrophic changes at L1-2, now severe.  Moderate disc space narrowing at L2-3 and L4-5.  No pars defects.  Mild facet degenerative changes at L4-5  and L5-S1.  Visualized sacroiliac joints intact.  IMPRESSION: No acute osseous abnormality.  Progressive degenerative disc disease and spondylosis at L1-2 since the examination 1 year ago, now severe.  Moderate disc space narrowing at L2-3 and L4-5.   Original Report Authenticated By: Arnell Sieving, M.D.     No diagnosis found.    MDM  The patient presents here with low back pain radiating into the right leg.  She has a history of degenerative disk disease and has occasional flareups of this.  She was seen here last one year ago for a similar complaint.  There is nothing in today's presentation or exam to suggest an emergently surgical cause to her symptoms.  There are no bowel or bladder complaints and reflexes and strength are intact and symmetrical.  She was given toradol and hydrocodone and is feeling better.  At this point, I believe she is stable for discharge with prednisone and hydrocodone.  She is to follow up with her doctor next week if not improving.      I personally performed the services described in this documentation, which was scribed in my presence. The recorded information has been reviewed and considered.           Audrey Lyons, MD 03/21/12 1626

## 2012-03-28 ENCOUNTER — Other Ambulatory Visit (HOSPITAL_COMMUNITY): Payer: Self-pay | Admitting: Internal Medicine

## 2012-03-28 DIAGNOSIS — IMO0002 Reserved for concepts with insufficient information to code with codable children: Secondary | ICD-10-CM

## 2012-03-28 DIAGNOSIS — M549 Dorsalgia, unspecified: Secondary | ICD-10-CM | POA: Diagnosis not present

## 2012-03-28 DIAGNOSIS — M519 Unspecified thoracic, thoracolumbar and lumbosacral intervertebral disc disorder: Secondary | ICD-10-CM | POA: Diagnosis not present

## 2012-03-28 DIAGNOSIS — Z23 Encounter for immunization: Secondary | ICD-10-CM | POA: Diagnosis not present

## 2012-04-01 ENCOUNTER — Ambulatory Visit (HOSPITAL_COMMUNITY)
Admission: RE | Admit: 2012-04-01 | Discharge: 2012-04-01 | Disposition: A | Discharge status: Home / Self Care | Payer: Medicare Other | Source: Ambulatory Visit | Attending: Internal Medicine | Admitting: Internal Medicine

## 2012-04-01 DIAGNOSIS — M79609 Pain in unspecified limb: Secondary | ICD-10-CM | POA: Insufficient documentation

## 2012-04-01 DIAGNOSIS — M5126 Other intervertebral disc displacement, lumbar region: Secondary | ICD-10-CM | POA: Insufficient documentation

## 2012-04-01 DIAGNOSIS — M545 Low back pain, unspecified: Secondary | ICD-10-CM | POA: Diagnosis not present

## 2012-04-01 DIAGNOSIS — IMO0002 Reserved for concepts with insufficient information to code with codable children: Secondary | ICD-10-CM

## 2012-04-25 ENCOUNTER — Encounter (HOSPITAL_COMMUNITY): Payer: Self-pay | Admitting: Pharmacy Technician

## 2012-04-25 ENCOUNTER — Other Ambulatory Visit: Payer: Self-pay | Admitting: Neurosurgery

## 2012-04-25 DIAGNOSIS — M5137 Other intervertebral disc degeneration, lumbosacral region: Secondary | ICD-10-CM | POA: Diagnosis not present

## 2012-04-25 DIAGNOSIS — M47817 Spondylosis without myelopathy or radiculopathy, lumbosacral region: Secondary | ICD-10-CM | POA: Diagnosis not present

## 2012-04-25 DIAGNOSIS — M5126 Other intervertebral disc displacement, lumbar region: Secondary | ICD-10-CM | POA: Diagnosis not present

## 2012-04-25 DIAGNOSIS — M48061 Spinal stenosis, lumbar region without neurogenic claudication: Secondary | ICD-10-CM | POA: Diagnosis not present

## 2012-05-03 NOTE — Pre-Procedure Instructions (Signed)
22 Audrey Johnson   05/03/2012   Your procedure is scheduled on: Wednesday, November 6th  Report to [facility] at 10:15 AM. (per the surgeon's request)   Call this number if you have problems the morning of surgery: [phone]   Remember:   Do not eat food or drink any liquids:After Midnight Tuesday.   Take these medicines the morning of surgery with A SIP OF WATER: none   Do not wear jewelry, make-up or nail polish.  Do not wear lotions, powders, or perfumes. You may NOT wear deodorant.  LADIES---Do not shave 48 hours prior to surgery.   Do not bring valuables to the hospital.  Contacts, dentures or bridgework may not be worn into surgery.   Leave suitcase in the car. After surgery it may be brought to your room.  For patients admitted to the hospital, checkout time is 11:00 AM the day of discharge.   Patients discharged the day of surgery will not be allowed to drive home.   Name and phone number of your driver:    Special Instructions: Shower using CHG 2 nights before surgery and the night before surgery.  If you shower the day of surgery use CHG.  Use special wash - you have one bottle of CHG for all showers.  You should use approximately 1/3 of the bottle for each shower.   Please read over the following fact sheets that you were given: Pain Booklet, Coughing and Deep Breathing, MRSA Information and Surgical Site Infection Prevention

## 2012-05-05 ENCOUNTER — Encounter (HOSPITAL_COMMUNITY)
Admission: RE | Admit: 2012-05-05 | Discharge: 2012-05-05 | Disposition: A | Discharge status: Home / Self Care | Payer: Medicare Other | Source: Ambulatory Visit | Attending: Neurosurgery | Admitting: Neurosurgery

## 2012-05-05 ENCOUNTER — Encounter (HOSPITAL_COMMUNITY): Payer: Self-pay

## 2012-05-05 ENCOUNTER — Encounter (HOSPITAL_COMMUNITY): Payer: Self-pay | Admitting: Vascular Surgery

## 2012-05-05 ENCOUNTER — Ambulatory Visit (HOSPITAL_COMMUNITY)
Admission: RE | Admit: 2012-05-05 | Discharge: 2012-05-05 | Disposition: A | Discharge status: Home / Self Care | Payer: Medicare Other | Source: Ambulatory Visit | Attending: Neurosurgery | Admitting: Neurosurgery

## 2012-05-05 DIAGNOSIS — Z01818 Encounter for other preprocedural examination: Secondary | ICD-10-CM | POA: Insufficient documentation

## 2012-05-05 HISTORY — DX: Unspecified osteoarthritis, unspecified site: M19.90

## 2012-05-05 HISTORY — DX: Shortness of breath: R06.02

## 2012-05-05 LAB — BASIC METABOLIC PANEL
BUN: 15 mg/dL (ref 6–23)
CO2: 23 mEq/L (ref 19–32)
Calcium: 9.5 mg/dL (ref 8.4–10.5)
Chloride: 105 mEq/L (ref 96–112)
Creatinine, Ser: 1.23 mg/dL — ABNORMAL HIGH (ref 0.50–1.10)
GFR calc Af Amer: 51 mL/min — ABNORMAL LOW (ref >90)
GFR calc non Af Amer: 44 mL/min — ABNORMAL LOW (ref >90)
Glucose, Bld: 101 mg/dL — ABNORMAL HIGH (ref 70–99)
Potassium: 4.2 mEq/L (ref 3.5–5.1)
Sodium: 139 mEq/L (ref 135–145)

## 2012-05-05 LAB — SURGICAL PCR SCREEN
MRSA, PCR: NEGATIVE
Staphylococcus aureus: POSITIVE — AB

## 2012-05-05 LAB — CBC
HCT: 40.8 % (ref 36.0–46.0)
Hemoglobin: 13.4 g/dL (ref 12.0–15.0)
MCH: 30.3 pg (ref 26.0–34.0)
MCHC: 32.8 g/dL (ref 30.0–36.0)
MCV: 92.3 fL (ref 78.0–100.0)
Platelets: 457 10*3/uL — ABNORMAL HIGH (ref 150–400)
RBC: 4.42 MIL/uL (ref 3.87–5.11)
RDW: 13.8 % (ref 11.5–15.5)
WBC: 8.7 10*3/uL (ref 4.0–10.5)

## 2012-05-05 NOTE — Progress Notes (Signed)
Audrey Johnson admits to sob even with simple walking .  She had studies done at [facility] in 2012 ... Jill Side advised .Marland Kitchen And will look at studies.  Also note that pt is not able to read very well ..and has difficulty with instructions .Marland Kitchen She is with her grand daughter .  The g-daughter was  With [place] during the pt interview.

## 2012-05-05 NOTE — Consult Note (Signed)
Anesthesia Consult:  Audrey Johnson is a 71 year old female scheduled for a right L4 laminotomy and microdiskectomy on 05/07/12 by Dr. Newell Coral.  Her PAT visit was on 05/05/12.    History includes non-smoker, HTN, hypercholesterolemia, anxiety, arthritis, chronic back pain, dyspnea on exertion, hysterectomy, prior breast and toe surgeries.  Audrey Johnson was evaluated for acute shortness of breath at [facility] in January 2012 and ruled out for DVT/PE.  CT of the chest suggested atelectasis or subtle pneumonia involving the RLL at that time.  PAT RN reports Audrey Johnson cannot read or write. Audrey Johnson is brought in by her granddaughter.  PCP is Dr. Felecia Shelling in [place] (recently established).    I was asked to see Audrey Johnson today due to reports of increased shortness of breath. Audrey Johnson reports this began approximately 2 months ago when Audrey Johnson began having lower back and right lower extremity pain. Prior to this, Audrey Johnson was walking approximately half a mile on a regular basis without significant shortness of breath or chest pain.  Now Audrey Johnson reports that Audrey Johnson cannot walk from room to room or make a bed without having to stop and rest to catch her breath.  This is also associated with mild chest "tightness."  There are no symptoms at rest and no associated symptoms of pre-syncope/syncope, palpitations, diaphoresis, nausea, or jaw or arm pain.  Audrey Johnson has not had any lower extremity edema and no pleuritic chest pain.  Audrey Johnson has not had any recent URI.  Audrey Johnson denies prior history of DVT/PE or myocardial infarction.    Exam findings: Vitals 112/66, HR 97, R 20, O2 sat on RA 99%.  BMI 29.49.  Audrey Johnson is alert, cooperative.  Flat affect.  Her heart has a RRR.  No murmur was appreciated.  Lungs sounds were clear, without wheezes or rhonchi.  No carotid bruits noted.  No lower extremity edema.  Negative Homan's sign bilaterally.  No calf tenderness bilaterally.  Her right ankle is tender posteriorly, but not swollen.       Meds includes megace,  Benicar HCT, Pravachol.  EKG on 05/05/12 showed NSR.  Echo on 07/17/10 showed: - Left ventricle: The cavity size was normal. Wall thickness was increased in a pattern of mild LVH. Systolic function was normal. The estimated ejection fraction was in the range of 55% to 60%.  Wall motion was normal; there were no regional wall motion abnormalities. Doppler parameters are consistent with abnormal left ventricular relaxation (grade 1 diastolic dysfunction). - Mitral valve: Mildly thickened leaflets . Mild regurgitation. - Right ventricle: Systolic function was normal. - Tricuspid valve: Mild regurgitation. - Pulmonary arteries: PA peak pressure: 31mm Hg (S). - Pericardium, extracardiac: There was no pericardial effusion.  CXR from 05/05/12 showed no active cardiopulmonary disease.  MRI of the lumbar spine on 04/01/12 showed: 1. New large broad-based soft disc protrusion at L4-5 with an extruded fragment extending superiorly behind the body of L4 on the right compressing the right L4 nerve.  2. New broad-based disc protrusions at L1-2, L2-3, and L3-4 without focal neural impingement.   Labs from 05/05/12 showed Cr 1.23, glucose 101.  CBC WNL.  Assessment/Plan: 1) Lumbar HNP. Currently scheduled for  right L4 laminotomy and microdiskectomy on 05/07/12 by Dr. Newell Coral.  2) New dyspnea with minimal exertion over the past two months.  Audrey Johnson is not a smoker.  Lung sounds and CXR findings were WNL today.  Physical exam findings were not typical for DVT.  Audrey Johnson did not appear to be in acute CHF.  Although deconditioning could be playing a part, I think that with the associated chest tightness that it would be best to have her seen by Cardiology pre-operatively for additional recommendations.  I reviewed with Anesthesiologist Dr. Katrinka Blazing who agreed with this plan.  Dr. Shawnie Pons at [facility] will see her as a work-in on 05/06/12 @ 1030.  Audrey Johnson and granddaughter given appointment date/time/location  information.  Darl Pikes at Dr. Earl Gala office updated.    Shonna Chock, PA-C 05/05/12 1600

## 2012-05-06 ENCOUNTER — Ambulatory Visit (INDEPENDENT_AMBULATORY_CARE_PROVIDER_SITE_OTHER): Payer: Medicare Other | Admitting: Cardiology

## 2012-05-06 ENCOUNTER — Encounter: Payer: Self-pay | Admitting: Cardiology

## 2012-05-06 VITALS — BP 122/64 | HR 89 | Ht 64.0 in | Wt 174.0 lb

## 2012-05-06 DIAGNOSIS — I10 Essential (primary) hypertension: Secondary | ICD-10-CM

## 2012-05-06 DIAGNOSIS — R0989 Other specified symptoms and signs involving the circulatory and respiratory systems: Secondary | ICD-10-CM | POA: Diagnosis not present

## 2012-05-06 DIAGNOSIS — R0600 Dyspnea, unspecified: Secondary | ICD-10-CM

## 2012-05-06 DIAGNOSIS — E785 Hyperlipidemia, unspecified: Secondary | ICD-10-CM | POA: Diagnosis not present

## 2012-05-06 DIAGNOSIS — R0609 Other forms of dyspnea: Secondary | ICD-10-CM | POA: Diagnosis not present

## 2012-05-06 NOTE — Patient Instructions (Addendum)
Your physician has requested that you have an echocardiogram. Echocardiography is a painless test that uses sound waves to create images of your heart. It provides your doctor with information about the size and shape of your heart and how well your heart's chambers and valves are working. This procedure takes approximately one hour. There are no restrictions for this procedure.  Your physician has requested that you have a lexiscan myoview. For further information please visit [email or web address] Please follow instruction sheet, as given.  Your physician recommends that you continue on your current medications as directed. Please refer to the Current Medication list given to you today.  Your physician recommends that you schedule a follow-up appointment in: 6 WEEKS

## 2012-05-06 NOTE — Progress Notes (Signed)
   HPI:  The patient is being sent over as an add on by anesthesia.  Patient is scheduled for tomorrow for surgery.  For the past two months the patient has had back issues.   She saw Dr. Newell Coral and has bothered her all the way down her leg.   The back problem needs surgery which is scheduled for tomorrow. Unfortunately, she also has had two months of shortness of breath with exertion.  She may get mild tightness.  This has been more of a problem over the past month.  She is not a smoker.  Prior echo revealed preserved LV function in 2012.  If she tries to sweep the floor or make the bed she has to stop in mid stream.   Prior to all of this, she could walk and do most anything.  She got along fine before all of this.    Current Outpatient Prescriptions  Medication Sig Dispense Refill  . megestrol (MEGACE) 40 MG/ML suspension Take 200 mg by mouth daily.      Marland Kitchen olmesartan-hydrochlorothiazide (BENICAR HCT) 40-25 MG per tablet Take 1 tablet by mouth daily.        . pravastatin (PRAVACHOL) 20 MG tablet Take 20 mg by mouth daily.        No Known Allergies  Past Medical History  Diagnosis Date  . Hypertension   . High cholesterol   . Chronic back pain   . Anxiety   . Shortness of breath      even walking causes sob...   . Arthritis     in back .     Past Surgical History  Procedure Date  . Abdominal hysterectomy   . Toe surgery   . Breast surgery unknown (many yrs)    excision  of lump right breast  was per pt "nothing"   . Dilation and curettage of uterus     No family history on file.  History   Social History  . Marital Status: Widowed    Spouse Name: N/A    Number of Children: N/A  . Years of Education: N/A   Occupational History  . Not on file.   Social History Main Topics  . Smoking status: Never Smoker   . Smokeless tobacco: Not on file  . Alcohol Use: No  . Drug Use: No  . Sexually Active: Yes    Birth Control/ Protection: Surgical   Other Topics Concern  .  Not on file   Social History Narrative  . No narrative on file    ROS: Please see the HPI.  All other systems reviewed and negative.  No rest pain.    PHYSICAL EXAM:  BP 122/64  Pulse 89  Ht 5\' 4"  (1.626 m)  Wt 174 lb (78.926 kg)  BMI 29.87 kg/m2  SpO2 98%  General: Well developed, well nourished, in no acute distress. Head:  Normocephalic and atraumatic. Neck: no JVD Lungs: Clear to auscultation and percussion. Heart: Normal S1 and S2.  No murmur, rubs or gallops.  Abdomen:  Normal bowel sounds; soft; non tender; no organomegaly Pulses: Pulses normal in all 4 extremities. Extremities: No clubbing or cyanosis. No edema. Neurologic: Alert and oriented x 3.  EKG:  NSR.  WNL.    ASSESSMENT AND PLAN:

## 2012-05-07 ENCOUNTER — Inpatient Hospital Stay (HOSPITAL_COMMUNITY): Admission: RE | Admit: 2012-05-07 | Payer: Medicare Other | Source: Ambulatory Visit | Admitting: Neurosurgery

## 2012-05-07 SURGERY — LUMBAR LAMINECTOMY/DECOMPRESSION MICRODISCECTOMY 1 LEVEL
Anesthesia: General | Laterality: Right

## 2012-05-15 ENCOUNTER — Ambulatory Visit (HOSPITAL_COMMUNITY): Payer: Medicare Other | Attending: Cardiovascular Disease | Admitting: Radiology

## 2012-05-15 VITALS — BP 127/70 | Ht 64.0 in | Wt 170.0 lb

## 2012-05-15 DIAGNOSIS — I10 Essential (primary) hypertension: Secondary | ICD-10-CM | POA: Insufficient documentation

## 2012-05-15 DIAGNOSIS — I059 Rheumatic mitral valve disease, unspecified: Secondary | ICD-10-CM | POA: Insufficient documentation

## 2012-05-15 DIAGNOSIS — R079 Chest pain, unspecified: Secondary | ICD-10-CM | POA: Diagnosis not present

## 2012-05-15 DIAGNOSIS — R0600 Dyspnea, unspecified: Secondary | ICD-10-CM

## 2012-05-15 DIAGNOSIS — R0989 Other specified symptoms and signs involving the circulatory and respiratory systems: Secondary | ICD-10-CM | POA: Insufficient documentation

## 2012-05-15 DIAGNOSIS — I369 Nonrheumatic tricuspid valve disorder, unspecified: Secondary | ICD-10-CM | POA: Diagnosis not present

## 2012-05-15 DIAGNOSIS — R0609 Other forms of dyspnea: Secondary | ICD-10-CM | POA: Insufficient documentation

## 2012-05-15 DIAGNOSIS — R42 Dizziness and giddiness: Secondary | ICD-10-CM | POA: Insufficient documentation

## 2012-05-15 DIAGNOSIS — J45909 Unspecified asthma, uncomplicated: Secondary | ICD-10-CM | POA: Diagnosis not present

## 2012-05-15 DIAGNOSIS — R0602 Shortness of breath: Secondary | ICD-10-CM | POA: Diagnosis not present

## 2012-05-15 DIAGNOSIS — R0789 Other chest pain: Secondary | ICD-10-CM | POA: Insufficient documentation

## 2012-05-15 MED ORDER — REGADENOSON 0.4 MG/5ML IV SOLN
0.4000 mg | Freq: Once | INTRAVENOUS | Status: AC
  Administered 2012-05-15: 0.4 mg via INTRAVENOUS

## 2012-05-15 MED ORDER — TECHNETIUM TC 99M SESTAMIBI GENERIC - CARDIOLITE
11.0000 | Freq: Once | INTRAVENOUS | Status: AC | PRN
  Administered 2012-05-15: 11 via INTRAVENOUS

## 2012-05-15 MED ORDER — TECHNETIUM TC 99M SESTAMIBI GENERIC - CARDIOLITE
33.0000 | Freq: Once | INTRAVENOUS | Status: AC | PRN
  Administered 2012-05-15: 33 via INTRAVENOUS

## 2012-05-15 NOTE — Progress Notes (Signed)
Echocardiogram performed.  

## 2012-05-15 NOTE — Progress Notes (Signed)
[facility] SITE 3 NUCLEAR MED [id number] [place] [address] [phone]  Cardiology Nuclear Med Study  Audrey Johnson is a 71 y.o. female     MRN : [id number]     DOB: 10/05/1940  Procedure Date: 05/15/2012  Nuclear Med Background Indication for Stress Test:  Evaluation for Ischemia and Surgical Clearance History:  Asthma and 07/2010 PE 2012 [facility] EF: 55-60% mild LVH Cardiac Risk Factors: Hypertension and Lipids  Symptoms:  Chest Pain, Chest Tightness with Exertion (last date of chest discomfort q day when sob), Dizziness, DOE, Fatigue with Exertion and SOB   Nuclear Pre-Procedure Caffeine/Decaff Intake:  None NPO After: 4:30 pm   Lungs:  clear O2 Sat: 97% on room air. IV 0.9% NS with Angio Cath:  22g  IV Site: R Forearm  IV Started by:  Milana Na, EMT-P  Chest Size (in):  36 Cup Size: C  Height: 5\' 4"  (1.626 m)  Weight:  170 lb (77.111 kg)  BMI:  Body mass index is 29.18 kg/(m^2). Tech Comments:  Rx with water this am    Nuclear Med Study 1 or 2 day study: 1 day  Stress Test Type:  Eugenie Birks  Reading MD: Kristeen Miss, MD  Order Authorizing Provider: T.Stuckey MD  Resting Radionuclide: Technetium 56m Sestamibi  Resting Radionuclide Dose: 11.0 mCi   Stress Radionuclide:  Technetium 1m Sestamibi  Stress Radionuclide Dose: 33.0 mCi           Stress Protocol Rest HR: 78 Stress HR: 134  Rest BP: 127/70 Stress BP: 150/66  Exercise Time (min): n/a METS: n/a          Dose of Adenosine (mg):  n/a Dose of Lexiscan: 0.4 mg  Dose of Atropine (mg): n/a Dose of Dobutamine: n/a mcg/kg/min (at max HR)  Stress Test Technologist: Frederick Peers, EMT-P  Nuclear Technologist:  Domenic Polite, CNMT     Rest Procedure:  Myocardial perfusion imaging was performed at rest 45 minutes following the intravenous administration of Technetium 24m Sestamibi. Rest ECG: NSR - Normal EKG  Stress Procedure:  The patient received IV  Lexiscan 0.4 mg over 15-seconds.  Technetium 41m Sestamibi injected at 30-seconds.  There were no significant changes, headache and fatigue with Lexiscan.  Quantitative spect images were obtained after a 45 minute delay. Stress ECG: No significant change from baseline ECG  QPS Raw Data Images:  Normal; no motion artifact; normal heart/lung ratio. Stress Images:  Normal homogeneous uptake in all areas of the myocardium. Rest Images:  Normal homogeneous uptake in all areas of the myocardium. Subtraction (SDS):  No evidence of ischemia. Transient Ischemic Dilatation (Normal <1.22):  1.07 Lung/Heart Ratio (Normal <0.45):  0.25  Quantitative Gated Spect Images QGS EDV:  37 ml QGS ESV:  8 ml  Impression Exercise Capacity:  Lexiscan with no exercise. BP Response:  Normal blood pressure response. Clinical Symptoms:  No significant symptoms noted. ECG Impression:  No significant ST segment change suggestive of ischemia. Comparison with Prior Nuclear Study: No images to compare  Overall Impression:  Normal stress nuclear study.  No evidence of ischemia.    LV Ejection Fraction: 79%.  LV Wall Motion:  NL LV Function; NL Wall Motion.    Vesta Mixer, Montez Hageman., MD, [facility] 05/15/2012, 3:05 PM Office - [phone] Pager [phone]

## 2012-05-19 DIAGNOSIS — R0600 Dyspnea, unspecified: Secondary | ICD-10-CM | POA: Insufficient documentation

## 2012-05-19 DIAGNOSIS — I10 Essential (primary) hypertension: Secondary | ICD-10-CM | POA: Insufficient documentation

## 2012-05-19 DIAGNOSIS — E785 Hyperlipidemia, unspecified: Secondary | ICD-10-CM | POA: Insufficient documentation

## 2012-05-19 NOTE — Assessment & Plan Note (Signed)
The patient has moderate dyspnea of effort, but also has not been able to do to much as well.  This is relatively recent, and its relationship to less active versus underlying issues is uncertain.  She has some mild chest tightness on occasion.  We will need to begin with a noninvasive evaluation first and then make decisions regarding the appropriate approach to this.  Unfortunately, she cannot exercise well.  Will get nuclear imaging study and repeat her echocardiogram.

## 2012-05-19 NOTE — Assessment & Plan Note (Signed)
Currently on treatment for this.

## 2012-05-19 NOTE — Assessment & Plan Note (Addendum)
Well controlled at this point in time.  On present medications.

## 2012-05-21 ENCOUNTER — Telehealth: Payer: Self-pay | Admitting: Cardiology

## 2012-05-21 NOTE — Telephone Encounter (Signed)
Pt was discussing surgery with Dr. Riley Kill and wanted the family to come in so the daughter was wondering could Dr. Riley Kill call her and talk to her

## 2012-05-23 NOTE — Telephone Encounter (Signed)
Per Dr Riley Kill he would like to see the pt and daughter on 05/27/12 at 5 to discuss plan.

## 2012-05-23 NOTE — Telephone Encounter (Signed)
Dr Riley Kill spoke with the pt and her daughter and they will come into the office on 05/27/12 at 5 to discuss plan of care.

## 2012-05-23 NOTE — Telephone Encounter (Signed)
I tried to call the daughter but the number is not correct.  I spoke with the pt and instructed her to have her daughter call our office to discuss appointment.

## 2012-05-28 ENCOUNTER — Encounter (HOSPITAL_COMMUNITY): Payer: Self-pay | Admitting: Pharmacy Technician

## 2012-05-28 ENCOUNTER — Other Ambulatory Visit: Payer: Self-pay

## 2012-05-28 DIAGNOSIS — R931 Abnormal findings on diagnostic imaging of heart and coronary circulation: Secondary | ICD-10-CM

## 2012-05-28 DIAGNOSIS — R0602 Shortness of breath: Secondary | ICD-10-CM

## 2012-06-02 ENCOUNTER — Other Ambulatory Visit (INDEPENDENT_AMBULATORY_CARE_PROVIDER_SITE_OTHER): Payer: Medicare Other

## 2012-06-02 DIAGNOSIS — R931 Abnormal findings on diagnostic imaging of heart and coronary circulation: Secondary | ICD-10-CM

## 2012-06-02 DIAGNOSIS — R0602 Shortness of breath: Secondary | ICD-10-CM | POA: Diagnosis not present

## 2012-06-02 DIAGNOSIS — R9439 Abnormal result of other cardiovascular function study: Secondary | ICD-10-CM

## 2012-06-02 LAB — CBC WITH DIFFERENTIAL/PLATELET
Basophils Absolute: 0.1 10*3/uL (ref 0.0–0.1)
Basophils Relative: 1 % (ref 0.0–3.0)
Eosinophils Absolute: 0 10*3/uL (ref 0.0–0.7)
Eosinophils Relative: 0.5 % (ref 0.0–5.0)
HCT: 37.5 % (ref 36.0–46.0)
Hemoglobin: 12.3 g/dL (ref 12.0–15.0)
Lymphocytes Relative: 33.8 % (ref 12.0–46.0)
Lymphs Abs: 2.4 10*3/uL (ref 0.7–4.0)
MCHC: 32.7 g/dL (ref 30.0–36.0)
MCV: 92.3 fl (ref 78.0–100.0)
Monocytes Absolute: 0.5 10*3/uL (ref 0.1–1.0)
Monocytes Relative: 7.5 % (ref 3.0–12.0)
Neutro Abs: 4 10*3/uL (ref 1.4–7.7)
Neutrophils Relative %: 57.2 % (ref 43.0–77.0)
Platelets: 410 10*3/uL — ABNORMAL HIGH (ref 150.0–400.0)
RBC: 4.06 Mil/uL (ref 3.87–5.11)
RDW: 14.6 % (ref 11.5–14.6)
WBC: 7.1 10*3/uL (ref 4.5–10.5)

## 2012-06-02 LAB — BASIC METABOLIC PANEL
BUN: 19 mg/dL (ref 6–23)
CO2: 20 mEq/L (ref 19–32)
Calcium: 9.2 mg/dL (ref 8.4–10.5)
Chloride: 107 mEq/L (ref 96–112)
Creatinine, Ser: 1.3 mg/dL — ABNORMAL HIGH (ref 0.4–1.2)
GFR: 50.33 mL/min — ABNORMAL LOW (ref >60.00)
Glucose, Bld: 99 mg/dL (ref 70–99)
Potassium: 3.2 mEq/L — ABNORMAL LOW (ref 3.5–5.1)
Sodium: 137 mEq/L (ref 135–145)

## 2012-06-02 LAB — PROTIME-INR
INR: 1.1 ratio — ABNORMAL HIGH (ref 0.8–1.0)
Prothrombin Time: 11.2 s (ref 10.2–12.4)

## 2012-06-04 MED ORDER — CEFAZOLIN SODIUM-DEXTROSE 2-3 GM-% IV SOLR
2.0000 g | INTRAVENOUS | Status: DC
  Filled 2012-06-04: qty 50

## 2012-06-05 ENCOUNTER — Encounter (HOSPITAL_COMMUNITY)
Admission: RE | Disposition: A | Discharge status: Home / Self Care | Payer: Self-pay | Source: Ambulatory Visit | Attending: Cardiology

## 2012-06-05 ENCOUNTER — Ambulatory Visit (HOSPITAL_COMMUNITY)
Admission: RE | Admit: 2012-06-05 | Discharge: 2012-06-05 | Disposition: A | Discharge status: Home / Self Care | Payer: Medicare Other | Source: Ambulatory Visit | Attending: Cardiology | Admitting: Cardiology

## 2012-06-05 DIAGNOSIS — E7800 Pure hypercholesterolemia, unspecified: Secondary | ICD-10-CM | POA: Insufficient documentation

## 2012-06-05 DIAGNOSIS — Z7902 Long term (current) use of antithrombotics/antiplatelets: Secondary | ICD-10-CM | POA: Insufficient documentation

## 2012-06-05 DIAGNOSIS — I10 Essential (primary) hypertension: Secondary | ICD-10-CM | POA: Diagnosis not present

## 2012-06-05 DIAGNOSIS — G8929 Other chronic pain: Secondary | ICD-10-CM | POA: Insufficient documentation

## 2012-06-05 DIAGNOSIS — R079 Chest pain, unspecified: Secondary | ICD-10-CM | POA: Insufficient documentation

## 2012-06-05 DIAGNOSIS — R0989 Other specified symptoms and signs involving the circulatory and respiratory systems: Secondary | ICD-10-CM | POA: Insufficient documentation

## 2012-06-05 DIAGNOSIS — Z79899 Other long term (current) drug therapy: Secondary | ICD-10-CM | POA: Diagnosis not present

## 2012-06-05 DIAGNOSIS — F411 Generalized anxiety disorder: Secondary | ICD-10-CM | POA: Diagnosis not present

## 2012-06-05 DIAGNOSIS — R0609 Other forms of dyspnea: Secondary | ICD-10-CM | POA: Insufficient documentation

## 2012-06-05 DIAGNOSIS — M549 Dorsalgia, unspecified: Secondary | ICD-10-CM | POA: Diagnosis not present

## 2012-06-05 HISTORY — PX: LEFT HEART CATHETERIZATION WITH CORONARY ANGIOGRAM: SHX5451

## 2012-06-05 LAB — BASIC METABOLIC PANEL
BUN: 16 mg/dL (ref 6–23)
CO2: 19 mEq/L (ref 19–32)
Calcium: 9.3 mg/dL (ref 8.4–10.5)
Chloride: 107 mEq/L (ref 96–112)
Creatinine, Ser: 1.3 mg/dL — ABNORMAL HIGH (ref 0.50–1.10)
GFR calc Af Amer: 47 mL/min — ABNORMAL LOW (ref >90)
GFR calc non Af Amer: 41 mL/min — ABNORMAL LOW (ref >90)
Glucose, Bld: 98 mg/dL (ref 70–99)
Potassium: 3.7 mEq/L (ref 3.5–5.1)
Sodium: 139 mEq/L (ref 135–145)

## 2012-06-05 LAB — D-DIMER, QUANTITATIVE: D-Dimer, Quant: 0.55 ug/mL-FEU — ABNORMAL HIGH (ref 0.00–0.48)

## 2012-06-05 SURGERY — LEFT HEART CATHETERIZATION WITH CORONARY ANGIOGRAM
Anesthesia: LOCAL

## 2012-06-05 MED ORDER — ACETAMINOPHEN 325 MG PO TABS: 650.0000 mg | ORAL_TABLET | ORAL | Status: DC | PRN

## 2012-06-05 MED ORDER — SODIUM CHLORIDE 0.9 % IJ SOLN: 3.0000 mL | Freq: Two times a day (BID) | INTRAMUSCULAR | Status: DC

## 2012-06-05 MED ORDER — SODIUM CHLORIDE 0.9 % IJ SOLN: 3.0000 mL | INTRAMUSCULAR | Status: DC | PRN

## 2012-06-05 MED ORDER — SODIUM CHLORIDE 0.9 % IV SOLN: INTRAVENOUS | Status: DC

## 2012-06-05 MED ORDER — POTASSIUM CHLORIDE CRYS ER 20 MEQ PO TBCR
40.0000 meq | EXTENDED_RELEASE_TABLET | Freq: Once | ORAL | Status: AC
  Administered 2012-06-05: 40 meq via ORAL
  Filled 2012-06-05 (×2): qty 2

## 2012-06-05 MED ORDER — ASPIRIN 81 MG PO CHEW: 324.0000 mg | CHEWABLE_TABLET | ORAL | Status: DC

## 2012-06-05 MED ORDER — FENTANYL CITRATE 0.05 MG/ML IJ SOLN
INTRAMUSCULAR | Status: AC
  Filled 2012-06-05: qty 2

## 2012-06-05 MED ORDER — SODIUM CHLORIDE 0.9 % IV SOLN: 250.0000 mL | INTRAVENOUS | Status: DC | PRN

## 2012-06-05 MED ORDER — HEPARIN (PORCINE) IN NACL 2-0.9 UNIT/ML-% IJ SOLN
INTRAMUSCULAR | Status: AC
  Filled 2012-06-05: qty 1000

## 2012-06-05 MED ORDER — OXYCODONE-ACETAMINOPHEN 5-325 MG PO TABS
1.0000 | ORAL_TABLET | Freq: Once | ORAL | Status: AC
  Administered 2012-06-05: 1 via ORAL

## 2012-06-05 MED ORDER — ASPIRIN 81 MG PO CHEW
CHEWABLE_TABLET | ORAL | Status: AC
  Administered 2012-06-05: 324 mg
  Filled 2012-06-05: qty 4

## 2012-06-05 MED ORDER — CEFAZOLIN SODIUM-DEXTROSE 2-3 GM-% IV SOLR
INTRAVENOUS | Status: AC
  Filled 2012-06-05: qty 50

## 2012-06-05 MED ORDER — NITROGLYCERIN 0.2 MG/ML ON CALL CATH LAB
INTRAVENOUS | Status: AC
  Filled 2012-06-05: qty 1

## 2012-06-05 MED ORDER — MIDAZOLAM HCL 2 MG/2ML IJ SOLN
INTRAMUSCULAR | Status: AC
  Filled 2012-06-05: qty 2

## 2012-06-05 MED ORDER — LIDOCAINE HCL (PF) 1 % IJ SOLN
INTRAMUSCULAR | Status: AC
  Filled 2012-06-05: qty 30

## 2012-06-05 MED ORDER — OXYCODONE-ACETAMINOPHEN 5-325 MG PO TABS
ORAL_TABLET | ORAL | Status: AC
  Filled 2012-06-05: qty 1

## 2012-06-05 MED ORDER — SODIUM CHLORIDE 0.9 % IV SOLN
INTRAVENOUS | Status: DC
Start: 2012-06-05 — End: 2012-06-05
  Administered 2012-06-05: 08:00:00 via INTRAVENOUS

## 2012-06-05 MED ORDER — ONDANSETRON HCL 4 MG/2ML IJ SOLN: 4.0000 mg | Freq: Four times a day (QID) | INTRAMUSCULAR | Status: DC | PRN

## 2012-06-05 MED ORDER — OXYCODONE-ACETAMINOPHEN 5-325 MG PO TABS
1.0000 | ORAL_TABLET | Freq: Once | ORAL | Status: AC
Start: 2012-06-05 — End: 2012-06-05
  Administered 2012-06-05: 1 via ORAL

## 2012-06-05 NOTE — CV Procedure (Signed)
   Cardiac Catheterization Procedure Note  Name: Audrey Johnson MRN: [id number] DOB: 1940-12-04  Procedure: Left Heart Cath, Selective Coronary Angiography, LV angiography  Indication: chest pain and dyspnea preop   Procedural details: The right groin was prepped, draped, and anesthetized with 1% lidocaine. Using a Smart Needle, a 4 French sheath was introduced into the right femoral artery. Standard Judkins catheters were used for coronary angiography and left ventriculography. Catheter exchanges were performed over a guidewire. There were no immediate procedural complications. The patient was transferred to the post catheterization recovery area for further monitoring.    Procedural Findings: Hemodynamics:  AO 129/69 (95) LV 121/11   Coronary angiography: Coronary dominance: right  Left mainstem: Large caliber vessel with no calcification and no stenosis  Left anterior descending (LAD): Large caliber vessel that courses to the apex and wraps the apical vessel.  There are multiple areas of tortuosity but no obvious critical blockage.  There is one major diagonal obstruction.   Left circumflex (LCx): Provides two large marginal and a PL system.  No critical obstruction is noted  Right coronary artery (RCA): Large in caliber and supplies a PDA and PLA.  No significant obstruction  Left ventriculography: Left ventricular systolic function is normal, LVEF is estimated at 65%, there is no significant mitral regurgitation   Final Conclusions:   1.  No significant obstruction 2.  Vigorous systolic function.  Recommendations:  1.  Check d-dimer.  Consider rehab post op.    Shawnie Pons 06/05/2012, 12:54 PM

## 2012-06-05 NOTE — H&P (Signed)
Audrey Pons, MD 05/19/2012 1:53 PM Signed  HPI: Audrey Johnson is a very delightful lady who presented as a preop consult.  Prior to her surgery, she complained of fullness and chest discomfort.  She ultimately underwent nuclear imaging, and I have subsequently met with the patient, her daughter, and grandaughter.  She continues to have symptoms, and we are uncertain as to the etiology.  Here is my original note:  The patient is being sent over as an add on by anesthesia. Patient is scheduled for tomorrow for surgery. For the past two months the patient has had back issues. She saw Dr. Newell Coral and has bothered her all the way down her leg. The back problem needs surgery which is scheduled for tomorrow. Unfortunately, she also has had two months of shortness of breath with exertion. She may get mild tightness. This has been more of a problem over the past month. She is not a smoker. Prior echo revealed preserved LV function in 2012. If she tries to sweep the floor or make the bed she has to stop in mid stream. Prior to all of this, she could walk and do most anything. She got along fine before all of this.  Nuclear imaging is as follows:   Rest Procedure: Myocardial perfusion imaging was performed at rest 45 minutes following the intravenous administration of Technetium 60m Sestamibi.  Rest ECG: NSR - Normal EKG  Stress Procedure: The patient received IV Lexiscan 0.4 mg over 15-seconds. Technetium 58m Sestamibi injected at 30-seconds. There were no significant changes, headache and fatigue with Lexiscan. Quantitative spect images were obtained after a 45 minute delay.  Stress ECG: No significant change from baseline ECG  QPS  Raw Data Images: Normal; no motion artifact; normal heart/lung ratio.  Stress Images: Normal homogeneous uptake in all areas of the myocardium.  Rest Images: Normal homogeneous uptake in all areas of the myocardium.  Subtraction (SDS): No evidence of ischemia.  Transient  Ischemic Dilatation (Normal <1.22): 1.07  Lung/Heart Ratio (Normal <0.45): 0.25  Quantitative Gated Spect Images  QGS EDV: 37 ml  QGS ESV: 8 ml  Impression  Exercise Capacity: Lexiscan with no exercise.  BP Response: Normal blood pressure response.  Clinical Symptoms: No significant symptoms noted.  ECG Impression: No significant ST segment change suggestive of ischemia.  Comparison with Prior Nuclear Study: No images to compare  Overall Impression: Normal stress nuclear study. No evidence of ischemia.  LV Ejection Fraction: 79%. LV Wall Motion: NL LV Function; NL Wall Motion.  Audrey Johnson, Montez Hageman., MD, [facility]  05/15/2012, 3:05 PM  Office - [phone]  Pager [phone]  She continues to have symptoms at the time of this writing.  As such, we have suggested she undergoing cardiac catheterization for definitive information prior to anesthesia for surgery.  She needs the surgery, and given her symptoms we felt we needed more information.  I have explained to her the risks, benefits, and alternatives to management.  We have reviewed all of the options and she is agreeable to proceed.      Current Outpatient Prescriptions   Medication  Sig  Dispense  Refill   .  megestrol (MEGACE) 40 MG/ML suspension  Take 200 mg by mouth daily.     Marland Kitchen  olmesartan-hydrochlorothiazide (BENICAR HCT) 40-25 MG per tablet  Take 1 tablet by mouth daily.     .  pravastatin (PRAVACHOL) 20 MG tablet  Take 20 mg by mouth daily.      No Known Allergies  Past Medical History   Diagnosis  Date   .  Hypertension    .  High cholesterol    .  Chronic back pain    .  Anxiety    .  Shortness of breath      even walking causes sob...   .  Arthritis      in back .    Past Surgical History   Procedure  Date   .  Abdominal hysterectomy    .  Toe surgery    .  Breast surgery  unknown (many yrs)     excision of lump right breast was per pt "nothing"   .  Dilation and curettage of uterus     No family history on file.   History    Social History   .  Marital Status:  Widowed     Spouse Name:  N/A     Number of Children:  N/A   .  Years of Education:  N/A    Occupational History   .  Not on file.    Social History Main Topics   .  Smoking status:  Never Smoker   .  Smokeless tobacco:  Not on file   .  Alcohol Use:  No   .  Drug Use:  No   .  Sexually Active:  Yes     Birth Control/ Protection:  Surgical    Other Topics  Concern   .  Not on file    Social History Narrative   .  No narrative on file    ROS:  Please see the HPI. All other systems reviewed and negative. No rest pain.  PHYSICAL EXAM:  BP 122/64  Pulse 89  Ht 5\' 4"  (1.626 m)  Wt 174 lb (78.926 kg)  BMI 29.87 kg/m2  SpO2 98%  General: Well developed, well nourished, in no acute distress.  Head: Normocephalic and atraumatic.  Neck: no JVD  Lungs: Clear to auscultation and percussion.  Heart: Normal S1 and S2. No murmur, rubs or gallops.  Abdomen: Normal bowel sounds; soft; non tender; no organomegaly  Pulses: Pulses normal in all 4 extremities.  Extremities: No clubbing or cyanosis. No edema.  Neurologic: Alert and oriented x 3.  EKG: NSR. WNL.  ASSESSMENT AND PLAN: 1. Dyspnea - The etiology of this remains unclear. She did not demonstrate ischemia by nuc imaging, but was unable to exercise due to her back.  Her exercise tolerance is limited.  Risks and alternatives were discussed and she consents to proceed.     2. Hyperlipidemia - Audrey Pons, MD 05/19/2012 1:53 PM Signed  Currently on treatment for this.  3.  Hypertension  -- controlled.

## 2012-06-05 NOTE — Interval H&P Note (Signed)
History and Physical Interval Note:  06/05/2012 12:16 PM  Audrey Johnson  has presented today for surgery, with the diagnosis of Chest pain  The various methods of treatment have been discussed with the patient and family. After consideration of risks, benefits and other options for treatment, the patient has consented to  Procedure(s) (LRB) with comments: LEFT HEART CATHETERIZATION WITH CORONARY ANGIOGRAM (N/A) as a surgical intervention .  The patient's history has been reviewed, patient examined, no change in status, stable for surgery.  I have reviewed the patient's chart and labs.  Questions were answered to the patient's satisfaction.   We went over this in detail for nearly an hour in the office.  Patient wanted to proceed, as the information we had still did not explain her increased dyspnea.  She is agreeable after understanding the risks involved.      Shawnie Pons

## 2012-06-12 ENCOUNTER — Telehealth: Payer: Self-pay | Admitting: Cardiology

## 2012-06-12 NOTE — Telephone Encounter (Signed)
Spoke with pt, aware dr Riley Kill is not here. She has a follow up appt with him next week to discuss clearance, she will keep that appt. Okay given for pt to remove bandage from right groin, cath stick site.

## 2012-06-12 NOTE — Telephone Encounter (Signed)
New Problem:    Patient called in because her legs and back are hurting, she was taken to [facility] where they ran some test on 06/05/12, and would like to know how to precede.  Please call back.

## 2012-06-18 ENCOUNTER — Ambulatory Visit (INDEPENDENT_AMBULATORY_CARE_PROVIDER_SITE_OTHER): Payer: Medicare Other | Admitting: Cardiology

## 2012-06-18 ENCOUNTER — Encounter: Payer: Self-pay | Admitting: Cardiology

## 2012-06-18 VITALS — BP 114/60 | HR 102 | Ht 64.0 in | Wt 170.2 lb

## 2012-06-18 DIAGNOSIS — R0600 Dyspnea, unspecified: Secondary | ICD-10-CM

## 2012-06-18 DIAGNOSIS — R0609 Other forms of dyspnea: Secondary | ICD-10-CM | POA: Diagnosis not present

## 2012-06-18 DIAGNOSIS — I10 Essential (primary) hypertension: Secondary | ICD-10-CM | POA: Diagnosis not present

## 2012-06-18 DIAGNOSIS — R0989 Other specified symptoms and signs involving the circulatory and respiratory systems: Secondary | ICD-10-CM

## 2012-06-18 NOTE — Progress Notes (Signed)
HPI:  Patient seen back today in followup. Cardiac-wise she is stable. However her back continues to bother her. I've encouraged her to set up appointment to readdress this with Dr. Newell Coral. . She underwent cardiac catheterization which did not demonstrate high grade narrowing. Her echocardiogram has been completed, and it does not and show an enlarged right ventricle. She had borderline d-dimer, but very few other criteria to suggest pulmonary emboli. She has no evidence of DVT by exam. I think at this point she could be cleared for surgery, and I strongly encouraged her to give Dr. Earl Gala office a call.  In addition, she actually does feel better and denies shortness of breath that is worse, or progression of any chest pain.   Current Outpatient Prescriptions  Medication Sig Dispense Refill  . HYDROcodone-acetaminophen (LORTAB) 7.5-500 MG per tablet Take 1 tablet by mouth every 6 (six) hours as needed.      . megestrol (MEGACE) 40 MG/ML suspension Take 200 mg by mouth daily.      Marland Kitchen olmesartan-hydrochlorothiazide (BENICAR HCT) 40-25 MG per tablet Take 1 tablet by mouth daily.        . pravastatin (PRAVACHOL) 20 MG tablet Take 20 mg by mouth daily.        No Known Allergies  Past Medical History  Diagnosis Date  . Hypertension   . High cholesterol   . Chronic back pain   . Anxiety   . Shortness of breath      even walking causes sob...   . Arthritis     in back .     Past Surgical History  Procedure Date  . Abdominal hysterectomy   . Toe surgery   . Breast surgery unknown (many yrs)    excision  of lump right breast  was per pt "nothing"   . Dilation and curettage of uterus     No family history on file.  History   Social History  . Marital Status: Widowed    Spouse Name: N/A    Number of Children: N/A  . Years of Education: N/A   Occupational History  . Not on file.   Social History Main Topics  . Smoking status: Never Smoker   . Smokeless tobacco: Not on  file  . Alcohol Use: No  . Drug Use: No  . Sexually Active: Yes    Birth Control/ Protection: Surgical   Other Topics Concern  . Not on file   Social History Narrative  . No narrative on file    ROS: Please see the HPI.  All other systems reviewed and negative.  PHYSICAL EXAM:  BP 114/60  Pulse 102  Ht 5\' 4"  (1.626 m)  Wt 170 lb 3.2 oz (77.202 kg)  BMI 29.21 kg/m2  SpO2 97%  General: Well developed, well nourished, in no acute distress. Head:  Normocephalic and atraumatic. Neck: no JVD Lungs: Clear to auscultation and percussion. Heart: Normal S1 and S2.  No murmur, rubs or gallops.  Abdomen:  Normal bowel sounds; soft; non tender; no organomegaly Pulses: Pulses normal in all 4 extremities.  Groin site healed.   Extremities: No clubbing or cyanosis. No edema. Neurologic: Alert and oriented x 3.  EKG:  Not done  CATH: AO 129/69 (95)  LV 121/11  Coronary angiography:  Coronary dominance: right  Left mainstem: Large caliber vessel with no calcification and no stenosis  Left anterior descending (LAD): Large caliber vessel that courses to the apex and wraps the apical vessel. There  are multiple areas of tortuosity but no obvious critical blockage. There is one major diagonal obstruction.  Left circumflex (LCx): Provides two large marginal and a PL system. No critical obstruction is noted  Right coronary artery (RCA): Large in caliber and supplies a PDA and PLA. No significant obstruction  Left ventriculography: Left ventricular systolic function is normal, LVEF is estimated at 65%, there is no significant mitral regurgitation  Final Conclusions:  1. No significant obstruction  2. Vigorous systolic function.  Recommendations:  1. Check d-dimer. Consider rehab post op.  Shawnie Pons  06/05/2012, 12:54 PM  ECHO Study Conclusions  - Left ventricle: The cavity size was normal. Wall thickness was normal. Systolic function was normal. The estimated ejection fraction  was in the range of 55% to 60%. Wall motion was normal; there were no regional wall motion abnormalities. Doppler parameters are consistent with abnormal left ventricular relaxation (grade 1 diastolic dysfunction). - Aortic valve: There was no stenosis. - Mitral valve: Trivial regurgitation. - Left atrium: The atrium was at the upper limits of normal in size. - Right ventricle: The cavity size was normal. Systolic function was normal. - Tricuspid valve: Peak RV-RA gradient: 24mm Hg (S). - Pulmonary arteries: PA peak pressure: 29mm Hg (S). - Inferior vena cava: The vessel was normal in size; the respirophasic diameter changes were in the normal range (= 50%); findings are consistent with normal central venous pressure. Impressions:  - Normal LV size and systolic function with EF 55-60%. Normal RV size and systolic function. No significant valvular abnormalities.       ASSESSMENT AND PLAN:

## 2012-06-18 NOTE — Patient Instructions (Addendum)
Your physician recommends that you continue on your current medications as directed. Please refer to the Current Medication list given to you today.  Your physician recommends that you schedule a follow-up appointment in: March 2014 with Dr. Fredrik Cove have been cleared for your surgery with Dr. Newell Coral

## 2012-06-19 ENCOUNTER — Other Ambulatory Visit: Payer: Self-pay | Admitting: Neurosurgery

## 2012-06-26 ENCOUNTER — Encounter (HOSPITAL_COMMUNITY): Payer: Self-pay | Admitting: Pharmacy Technician

## 2012-06-26 DIAGNOSIS — I10 Essential (primary) hypertension: Secondary | ICD-10-CM | POA: Diagnosis not present

## 2012-06-26 DIAGNOSIS — E7800 Pure hypercholesterolemia, unspecified: Secondary | ICD-10-CM | POA: Diagnosis not present

## 2012-06-26 DIAGNOSIS — M549 Dorsalgia, unspecified: Secondary | ICD-10-CM | POA: Diagnosis not present

## 2012-06-30 ENCOUNTER — Encounter (HOSPITAL_COMMUNITY): Payer: Self-pay

## 2012-06-30 ENCOUNTER — Inpatient Hospital Stay (HOSPITAL_COMMUNITY): Payer: Medicare Other | Admitting: Vascular Surgery

## 2012-06-30 ENCOUNTER — Encounter (HOSPITAL_COMMUNITY)
Admission: RE | Admit: 2012-06-30 | Discharge: 2012-06-30 | Disposition: A | Discharge status: Home / Self Care | Payer: Medicare Other | Source: Ambulatory Visit | Attending: Neurosurgery | Admitting: Neurosurgery

## 2012-06-30 ENCOUNTER — Encounter (HOSPITAL_COMMUNITY): Payer: Self-pay | Admitting: Vascular Surgery

## 2012-06-30 DIAGNOSIS — Z538 Procedure and treatment not carried out for other reasons: Secondary | ICD-10-CM | POA: Insufficient documentation

## 2012-06-30 DIAGNOSIS — Z01812 Encounter for preprocedural laboratory examination: Secondary | ICD-10-CM | POA: Insufficient documentation

## 2012-06-30 DIAGNOSIS — M48061 Spinal stenosis, lumbar region without neurogenic claudication: Secondary | ICD-10-CM | POA: Insufficient documentation

## 2012-06-30 DIAGNOSIS — Z01818 Encounter for other preprocedural examination: Secondary | ICD-10-CM | POA: Diagnosis not present

## 2012-06-30 HISTORY — DX: Pneumonia, unspecified organism: J18.9

## 2012-06-30 LAB — CBC
HCT: 40.1 % (ref 36.0–46.0)
Hemoglobin: 13.1 g/dL (ref 12.0–15.0)
MCH: 30.3 pg (ref 26.0–34.0)
MCHC: 32.7 g/dL (ref 30.0–36.0)
MCV: 92.8 fL (ref 78.0–100.0)
Platelets: 434 10*3/uL — ABNORMAL HIGH (ref 150–400)
RBC: 4.32 MIL/uL (ref 3.87–5.11)
RDW: 13.9 % (ref 11.5–15.5)
WBC: 6.3 10*3/uL (ref 4.0–10.5)

## 2012-06-30 LAB — SURGICAL PCR SCREEN
MRSA, PCR: NEGATIVE
Staphylococcus aureus: POSITIVE — AB

## 2012-06-30 LAB — BASIC METABOLIC PANEL
BUN: 20 mg/dL (ref 6–23)
CO2: 22 mEq/L (ref 19–32)
Calcium: 10.1 mg/dL (ref 8.4–10.5)
Chloride: 103 mEq/L (ref 96–112)
Creatinine, Ser: 1.34 mg/dL — ABNORMAL HIGH (ref 0.50–1.10)
GFR calc Af Amer: 46 mL/min — ABNORMAL LOW (ref >90)
GFR calc non Af Amer: 39 mL/min — ABNORMAL LOW (ref >90)
Glucose, Bld: 137 mg/dL — ABNORMAL HIGH (ref 70–99)
Potassium: 3.5 mEq/L (ref 3.5–5.1)
Sodium: 140 mEq/L (ref 135–145)

## 2012-06-30 NOTE — Progress Notes (Signed)
Patient had a stress test on 05/15/12, cardiac cath on 06/05/12, but patient denied having a sleep study. Revonda Standard, PA placed cardiac clearance note on chart from Dr. Rosalyn Charters office. Patient denied having any cardiac or pulmonary issues at this time.

## 2012-06-30 NOTE — Consult Note (Signed)
Anesthesia Chart Review:  Patient is a 71 year old female scheduled for a right L4 laminotomy and microdiskectomy on 07/09/12 by Dr. Newell Coral.  This procedure was initially scheduled for 05/07/12, but was canceled until she was cleared by cardiology as she was experiencing new dyspnea with minimal exertion and mild chest tightness.  (See my note from 05/05/12.)  She has since had a stress, echo, and cardiac cath and has been cleared by Dr. Riley Kill (see his note from 06/18/12).  History includes non-smoker, HTN, hypercholesterolemia, anxiety, arthritis, chronic back pain, dyspnea on exertion, hysterectomy, prior breast and toe surgeries. She was evaluated for acute shortness of breath at [facility] in January 2012 and ruled out for DVT/PE. CT of the chest suggested atelectasis or subtle pneumonia involving the RLL at that time.  Dr. Riley Kill re-checked a d-dimer on 06/05/12.  It was slightly elevated @ 0.55, but lower than when she ruled out for PE in 2012.  He did not recommend any additional testing at that time as he felt there was low probability for PE/DVT by other findings.  As above, he ultimately cleared her for this procedure.  PCP is Dr. Felecia Shelling in [place].  Her PAT RN in November reported that patient cannot read or write.   EKG on 05/06/12 showed NSR.   Cardiac cath on 06/05/12 showed no significant obstruction, normal LV systolic function , LVEF 65%, no significant MR.  Echo on 05/15/12 showed:  - Left ventricle: The cavity size was normal. Wall thickness was normal. Systolic function was normal. The estimated ejection fraction was in the range of 55% to 60%. Wall motion was normal; there were no regional wall motion abnormalities. Doppler parameters are consistent with abnormal left ventricular relaxation (grade 1 diastolic dysfunction). - Aortic valve: There was no stenosis. - Mitral valve: Trivial regurgitation. - Left atrium: The atrium was at the upper limits of normal in  size. - Right ventricle: The cavity size was normal. Systolic function was normal. - Tricuspid valve: Peak RV-RA gradient: 24mm Hg (S). - Pulmonary arteries: PA peak pressure: 29mm Hg (S). - Inferior vena cava: The vessel was normal in size; the respirophasic diameter changes were in the normal range (= 50%); findings are consistent with normal central venous pressure. Impressions: Normal LV size and systolic function with EF 55-60%. Normal RV size and systolic function. No significant valvular abnormalities.  Nuclear stress test on 05/15/12 showed: Normal stress nuclear study. No evidence of ischemia. LV Ejection Fraction: 79%. LV Wall Motion: NL LV Function; NL Wall Motion.  CXR from 05/05/12 showed no active cardiopulmonary disease.   MRI of the lumbar spine on 04/01/12 showed:  1. New large broad-based soft disc protrusion at L4-5 with an extruded fragment extending superiorly behind the body of L4 on the right compressing the right L4 nerve.  2. New broad-based disc protrusions at L1-2, L2-3, and L3-4 without focal neural impingement.   Pre-operative labs noted.  Anticipate she can proceed as planned.  Shonna Chock, PA-C 06/30/12 [phone]

## 2012-06-30 NOTE — Pre-Procedure Instructions (Signed)
[address]  06/30/2012   Your procedure is scheduled on:  Wednesday July 09, 2012.  Report to [facility] at 1030 AM.  Call this number if you have problems the morning of surgery: [phone]   Remember:   Do not eat food or drink:After Midnight.    Take these medicines the morning of surgery with A SIP OF WATER: Hydrocodone (Lortab) if needed for pain. Discontinue aspirin, Coumadin, Plavix, Effient, and herbal medications 7 days prior to surgery     Do not wear jewelry, make-up or nail polish.  Do not wear lotions, powders, or perfumes. You may wear deodorant.  Do not shave 48 hours prior to surgery.   Do not bring valuables to the hospital.  Contacts, dentures or bridgework may not be worn into surgery.  Leave suitcase in the car. After surgery it may be brought to your room.  For patients admitted to the hospital, checkout time is 11:00 AM the day of discharge.   Patients discharged the day of surgery will not be allowed to drive home.  Name and phone number of your driver:   Special Instructions: Shower using CHG 2 nights before surgery and the night before surgery.  If you shower the day of surgery use CHG.  Use special wash - you have one bottle of CHG for all showers.  You should use approximately 1/3 of the bottle for each shower.   Please read over the following fact sheets that you were given: Pain Booklet, Coughing and Deep Breathing, MRSA Information and Surgical Site Infection Prevention

## 2012-06-30 NOTE — Assessment & Plan Note (Signed)
This seems improved and her cath study is as noted.  LV function is preserved.  She should be a candidate for surgery if Dr. Newell Coral needs to go forward.

## 2012-06-30 NOTE — Assessment & Plan Note (Addendum)
This is currently controlled. Encouraged to use K source.  May need to add.

## 2012-07-01 ENCOUNTER — Telehealth: Payer: Self-pay | Admitting: Cardiology

## 2012-07-01 MED ORDER — POTASSIUM CHLORIDE ER 10 MEQ PO TBCR: 10.0000 meq | EXTENDED_RELEASE_TABLET | Freq: Every day | ORAL | Status: DC

## 2012-07-01 NOTE — Telephone Encounter (Signed)
New Problem: ° ° ° °Patient called in returning a call.  Please call back. °

## 2012-07-01 NOTE — Telephone Encounter (Signed)
Per Dr Riley Kill: Leotis Shames I think she should probably take a K supplement. Can you have her come in late week to recheck, and if it is low, start K DUR prior to her surgery. Thanks.   The pt had a pre-op BMP drawn on 06/30/12--K was 3.5  I spoke with the pt and made her aware that she does need to start a potassium supplement.  Rx sent to pharmacy. I will forward this message to Dr Riley Kill to determine when the pt should have follow-up labs (surgery scheduled 07/09/12).

## 2012-07-08 MED ORDER — CEFAZOLIN SODIUM-DEXTROSE 2-3 GM-% IV SOLR
2.0000 g | INTRAVENOUS | Status: AC
  Filled 2012-07-08: qty 50

## 2012-07-09 ENCOUNTER — Encounter (HOSPITAL_COMMUNITY): Payer: Self-pay | Admitting: *Deleted

## 2012-07-09 ENCOUNTER — Encounter (HOSPITAL_COMMUNITY)
Admission: RE | Disposition: A | Discharge status: Home / Self Care | Payer: Self-pay | Source: Ambulatory Visit | Attending: Neurosurgery

## 2012-07-09 ENCOUNTER — Encounter (HOSPITAL_COMMUNITY): Payer: Self-pay | Admitting: Vascular Surgery

## 2012-07-09 ENCOUNTER — Ambulatory Visit (HOSPITAL_COMMUNITY)
Admission: RE | Admit: 2012-07-09 | Discharge: 2012-07-10 | Disposition: A | Discharge status: Home / Self Care | Payer: Medicare Other | Source: Ambulatory Visit | Attending: Neurosurgery | Admitting: Neurosurgery

## 2012-07-09 DIAGNOSIS — Z5309 Procedure and treatment not carried out because of other contraindication: Secondary | ICD-10-CM | POA: Diagnosis not present

## 2012-07-09 DIAGNOSIS — M545 Low back pain, unspecified: Secondary | ICD-10-CM | POA: Insufficient documentation

## 2012-07-09 SURGERY — CANCELLED PROCEDURE
Anesthesia: General

## 2012-07-09 MED ORDER — ONDANSETRON HCL 4 MG/2ML IJ SOLN: 4.0000 mg | Freq: Once | INTRAMUSCULAR | Status: AC | PRN

## 2012-07-09 MED ORDER — HYDROMORPHONE HCL PF 1 MG/ML IJ SOLN: 0.2500 mg | INTRAMUSCULAR | Status: DC | PRN

## 2012-07-09 MED ORDER — LACTATED RINGERS IV SOLN
INTRAVENOUS | Status: DC | PRN
  Administered 2012-07-09: 11:00:00 via INTRAVENOUS

## 2012-07-09 SURGICAL SUPPLY — 46 items
APL SKNCLS STERI-STRIP NONHPOA (GAUZE/BANDAGES/DRESSINGS)
BAG DECANTER FOR FLEXI CONT (MISCELLANEOUS) ×3 IMPLANT
BENZOIN TINCTURE PRP APPL 2/3 (GAUZE/BANDAGES/DRESSINGS) IMPLANT
BLADE SURG ROTATE 9660 (MISCELLANEOUS) IMPLANT
BRUSH SCRUB EZ PLAIN DRY (MISCELLANEOUS) ×3 IMPLANT
CANISTER SUCTION 2500CC (MISCELLANEOUS) ×3 IMPLANT
CLOTH BEACON ORANGE TIMEOUT ST (SAFETY) ×3 IMPLANT
CONT SPEC 4OZ CLIKSEAL STRL BL (MISCELLANEOUS) IMPLANT
DRAPE LAPAROTOMY 100X72X124 (DRAPES) ×3 IMPLANT
DRAPE MICROSCOPE LEICA (MISCELLANEOUS) ×3 IMPLANT
DRAPE POUCH INSTRU U-SHP 10X18 (DRAPES) ×3 IMPLANT
DRSG EMULSION OIL 3X3 NADH (GAUZE/BANDAGES/DRESSINGS) IMPLANT
ELECT REM PT RETURN 9FT ADLT (ELECTROSURGICAL) ×2
ELECTRODE REM PT RTRN 9FT ADLT (ELECTROSURGICAL) ×2 IMPLANT
GAUZE SPONGE 4X4 16PLY XRAY LF (GAUZE/BANDAGES/DRESSINGS) IMPLANT
GLOVE BIOGEL PI IND STRL 8 (GLOVE) ×2 IMPLANT
GLOVE BIOGEL PI INDICATOR 8 (GLOVE) ×1
GLOVE ECLIPSE 7.5 STRL STRAW (GLOVE) ×3 IMPLANT
GLOVE EXAM NITRILE LRG STRL (GLOVE) IMPLANT
GLOVE EXAM NITRILE MD LF STRL (GLOVE) IMPLANT
GLOVE EXAM NITRILE XL STR (GLOVE) IMPLANT
GLOVE EXAM NITRILE XS STR PU (GLOVE) IMPLANT
GOWN BRE IMP SLV AUR LG STRL (GOWN DISPOSABLE) ×3 IMPLANT
GOWN BRE IMP SLV AUR XL STRL (GOWN DISPOSABLE) IMPLANT
GOWN STRL REIN 2XL LVL4 (GOWN DISPOSABLE) IMPLANT
KIT BASIN OR (CUSTOM PROCEDURE TRAY) ×3 IMPLANT
KIT ROOM TURNOVER OR (KITS) ×3 IMPLANT
NDL HYPO 18GX1.5 BLUNT FILL (NEEDLE) IMPLANT
NDL SPNL 18GX3.5 QUINCKE PK (NEEDLE) ×1 IMPLANT
NDL SPNL 22GX3.5 QUINCKE BK (NEEDLE) ×1 IMPLANT
NEEDLE HYPO 18GX1.5 BLUNT FILL (NEEDLE) IMPLANT
NEEDLE SPNL 18GX3.5 QUINCKE PK (NEEDLE) ×2 IMPLANT
NEEDLE SPNL 22GX3.5 QUINCKE BK (NEEDLE) ×2 IMPLANT
NS IRRIG 1000ML POUR BTL (IV SOLUTION) ×3 IMPLANT
PAD ARMBOARD 7.5X6 YLW CONV (MISCELLANEOUS) ×9 IMPLANT
PATTIES SURGICAL .5 X1 (DISPOSABLE) ×3 IMPLANT
RUBBERBAND STERILE (MISCELLANEOUS) ×6 IMPLANT
SPONGE GAUZE 4X4 12PLY (GAUZE/BANDAGES/DRESSINGS) IMPLANT
SPONGE LAP 4X18 X RAY DECT (DISPOSABLE) IMPLANT
SPONGE SURGIFOAM ABS GEL SZ50 (HEMOSTASIS) ×3 IMPLANT
STRIP CLOSURE SKIN 1/2X4 (GAUZE/BANDAGES/DRESSINGS) IMPLANT
SYR 20CC LL (SYRINGE) ×3 IMPLANT
SYR 5ML LL (SYRINGE) IMPLANT
TOWEL OR 17X24 6PK STRL BLUE (TOWEL DISPOSABLE) ×3 IMPLANT
TOWEL OR 17X26 10 PK STRL BLUE (TOWEL DISPOSABLE) ×3 IMPLANT
WATER STERILE IRR 1000ML POUR (IV SOLUTION) ×3 IMPLANT

## 2012-07-09 NOTE — Progress Notes (Signed)
Filed Vitals:   07/09/12 0918  BP: 115/72  Pulse: 81  Temp: 98.8 F (37.1 C)  TempSrc: Oral  Resp: 18  SpO2: 100%     Patient hospital for scheduled right L4-5 lumbar laminotomy and microdiscectomy. In the preoperative area the patient was reassessed. She describes it at this point she's having pain in the left side of her low back. She no longer describes right lumbar radicular pain. I reviewed her MRI again, it at this point I've recommended canceling her surgery, and having her return to the office in a week or 2 for reassessment.  Plan: Surgery and admission canceled.  Hewitt Shorts, MD 07/09/2012, 10:56 AM

## 2012-07-09 NOTE — Anesthesia Preprocedure Evaluation (Addendum)
Anesthesia Evaluation  Patient identified by MRN, date of birth, ID band Patient awake    Reviewed: Allergy & Precautions, H&P , NPO status , Patient's Chart, lab work & pertinent test results  History of Anesthesia Complications (+) AWARENESS UNDER ANESTHESIA  Airway Mallampati: I TM Distance: >3 FB Neck ROM: full    Dental   Pulmonary          Cardiovascular hypertension, Rhythm:regular Rate:Normal     Neuro/Psych Anxiety    GI/Hepatic   Endo/Other    Renal/GU      Musculoskeletal   Abdominal   Peds  Hematology   Anesthesia Other Findings   Reproductive/Obstetrics                          Anesthesia Physical Anesthesia Plan  ASA: II  Anesthesia Plan: General   Post-op Pain Management:    Induction: Intravenous  Airway Management Planned: Oral ETT  Additional Equipment:   Intra-op Plan:   Post-operative Plan: Extubation in OR  Informed Consent: I have reviewed the patients History and Physical, chart, labs and discussed the procedure including the risks, benefits and alternatives for the proposed anesthesia with the patient or authorized representative who has indicated his/her understanding and acceptance.     Plan Discussed with: CRNA, Anesthesiologist and Surgeon  Anesthesia Plan Comments:         Anesthesia Quick Evaluation

## 2012-07-09 NOTE — Preoperative (Signed)
Beta Blockers   Reason not to administer Beta Blockers:Not Applicable 

## 2012-09-19 ENCOUNTER — Ambulatory Visit: Payer: Medicare Other | Admitting: Cardiology

## 2012-10-06 DIAGNOSIS — M549 Dorsalgia, unspecified: Secondary | ICD-10-CM | POA: Diagnosis not present

## 2012-10-06 DIAGNOSIS — E785 Hyperlipidemia, unspecified: Secondary | ICD-10-CM | POA: Diagnosis not present

## 2012-10-06 DIAGNOSIS — E162 Hypoglycemia, unspecified: Secondary | ICD-10-CM | POA: Diagnosis not present

## 2012-10-06 DIAGNOSIS — I10 Essential (primary) hypertension: Secondary | ICD-10-CM | POA: Diagnosis not present

## 2012-10-06 DIAGNOSIS — R1032 Left lower quadrant pain: Secondary | ICD-10-CM | POA: Diagnosis not present

## 2012-10-20 DIAGNOSIS — E7800 Pure hypercholesterolemia, unspecified: Secondary | ICD-10-CM | POA: Diagnosis not present

## 2012-10-20 DIAGNOSIS — E119 Type 2 diabetes mellitus without complications: Secondary | ICD-10-CM | POA: Diagnosis not present

## 2012-10-20 DIAGNOSIS — I10 Essential (primary) hypertension: Secondary | ICD-10-CM | POA: Diagnosis not present

## 2012-10-20 DIAGNOSIS — A048 Other specified bacterial intestinal infections: Secondary | ICD-10-CM | POA: Diagnosis not present

## 2012-11-19 DIAGNOSIS — K219 Gastro-esophageal reflux disease without esophagitis: Secondary | ICD-10-CM | POA: Diagnosis not present

## 2012-11-19 DIAGNOSIS — M549 Dorsalgia, unspecified: Secondary | ICD-10-CM | POA: Diagnosis not present

## 2012-11-21 DIAGNOSIS — M5126 Other intervertebral disc displacement, lumbar region: Secondary | ICD-10-CM | POA: Diagnosis not present

## 2012-11-21 DIAGNOSIS — M5137 Other intervertebral disc degeneration, lumbosacral region: Secondary | ICD-10-CM | POA: Diagnosis not present

## 2012-11-27 DIAGNOSIS — Q762 Congenital spondylolisthesis: Secondary | ICD-10-CM | POA: Diagnosis not present

## 2012-11-27 DIAGNOSIS — M545 Low back pain, unspecified: Secondary | ICD-10-CM | POA: Diagnosis not present

## 2012-12-02 ENCOUNTER — Encounter: Payer: Self-pay | Admitting: Internal Medicine

## 2012-12-02 DIAGNOSIS — M545 Low back pain, unspecified: Secondary | ICD-10-CM | POA: Diagnosis not present

## 2012-12-02 DIAGNOSIS — M47817 Spondylosis without myelopathy or radiculopathy, lumbosacral region: Secondary | ICD-10-CM | POA: Diagnosis not present

## 2012-12-04 ENCOUNTER — Encounter: Payer: Self-pay | Admitting: Gastroenterology

## 2012-12-04 ENCOUNTER — Ambulatory Visit (INDEPENDENT_AMBULATORY_CARE_PROVIDER_SITE_OTHER): Payer: Medicare Other | Admitting: Gastroenterology

## 2012-12-04 ENCOUNTER — Ambulatory Visit (HOSPITAL_COMMUNITY)
Admission: RE | Admit: 2012-12-04 | Discharge: 2012-12-04 | Disposition: A | Discharge status: Home / Self Care | Payer: Medicare Other | Source: Ambulatory Visit | Attending: Gastroenterology | Admitting: Gastroenterology

## 2012-12-04 VITALS — BP 119/71 | HR 78 | Temp 97.6°F | Ht 64.0 in | Wt 170.6 lb

## 2012-12-04 DIAGNOSIS — Q619 Cystic kidney disease, unspecified: Secondary | ICD-10-CM | POA: Insufficient documentation

## 2012-12-04 DIAGNOSIS — R634 Abnormal weight loss: Secondary | ICD-10-CM | POA: Diagnosis not present

## 2012-12-04 DIAGNOSIS — R109 Unspecified abdominal pain: Secondary | ICD-10-CM

## 2012-12-04 DIAGNOSIS — G8929 Other chronic pain: Secondary | ICD-10-CM | POA: Insufficient documentation

## 2012-12-04 DIAGNOSIS — N281 Cyst of kidney, acquired: Secondary | ICD-10-CM | POA: Diagnosis not present

## 2012-12-04 LAB — BASIC METABOLIC PANEL
BUN: 25 mg/dL — ABNORMAL HIGH (ref 6–23)
CO2: 26 mEq/L (ref 19–32)
Calcium: 10.1 mg/dL (ref 8.4–10.5)
Chloride: 103 mEq/L (ref 96–112)
Creat: 1.36 mg/dL — ABNORMAL HIGH (ref 0.50–1.10)
Glucose, Bld: 102 mg/dL — ABNORMAL HIGH (ref 70–99)
Potassium: 4.5 mEq/L (ref 3.5–5.3)
Sodium: 142 mEq/L (ref 135–145)

## 2012-12-04 MED ORDER — IOHEXOL 300 MG/ML  SOLN
100.0000 mL | Freq: Once | INTRAMUSCULAR | Status: AC | PRN
  Administered 2012-12-04: 100 mL via INTRAVENOUS

## 2012-12-04 NOTE — Assessment & Plan Note (Signed)
72 year old female referred by Dr. Margo Aye due to abdominal pain, last colonoscopy normal in 2008. At presentation, she is holding her lower abdomen and obviously uncomfortable. Denies any other associated symptoms such as N/V, rectal bleeding, change in bowel habits. Interestingly, she notes loss of appetite recently with self-reported weight loss of around 10 lbs. I do not have documentation to verify this. Abdominal pain is intermittent, but this also wakes her in the early morning hours. I noted H.pylori serology was strongly positive. She has no upper GI symptoms other than loss of appetite. Her presentation is not quite typical for ulcer disease, but this is unable to be ruled out. Definitely will need treatment for H.pylori in the future, and I recommend an EGD in near future as well due to loss of appetite and weight loss. However, I'd like to obtain a CT abd/pelvis today due to her obvious discomfort. May need repeat lower GI evaluation at time of EGD. Patient is well aware of the risks and benefits accompanying both procedures. Further recommendations after CT today.

## 2012-12-04 NOTE — Progress Notes (Signed)
Cc PCP 

## 2012-12-04 NOTE — Patient Instructions (Addendum)
Please have blood work and CT scan done today. We will call with the results.  Continue to take your reflux medication twice a day. Call your doctor about what dosage of Neurontin he wants you to take.  I definitely want you to have an upper endoscopy in the near future; you may or may not need a colonoscopy as well. We will decide this after review of the CT.

## 2012-12-04 NOTE — Progress Notes (Signed)
Quick Note:  CT abd/pelvis reviewed and without acute findings. I need to discuss further with radiology; I'd like comment on the vasculature. Symptoms do not seem to point towards mesenteric ischemia, but with her age and comorbidities, we need to rule this out. ______

## 2012-12-04 NOTE — Progress Notes (Signed)
Primary Care Physician:  Catalina Pizza, MD Primary Gastroenterologist:  Dr. Jena Gauss   Chief Complaint  Patient presents with  . Abdominal Pain    burn    HPI:   Ms. Dunsmore is a pleasant 72 year old female who presents today at the request of Dr. Catalina Pizza secondary to abdominal pain. Upon entering room, she is in obvious discomfort, holding her lower abdomen. States pain is chronic, intermittent, onset about 2 years ago. Located lower abdomen. No epigastric pain. Sometimes radiates around to back.  Intermittent, wakes her up about 3am. No pain after eating. Hurting now. Noted as burning. No N/V. No constipation or diarrhea. BM early every morning. Thnks she has abdominal bloating every once in awhile. No rectal bleeding. +weight loss, decreased appetite. Thinks she has lost about 10 lbs in the past few months. No GERD. No dysphagia.   Review of recent labs from April 2014 reveal H.pylori serology strongly positive at > 8.00. LFTs normal at that time. Hgb normal at 12.8, Plts elevated at 647.   Past Medical History  Diagnosis Date  . Hypertension   . High cholesterol   . Chronic back pain   . Anxiety   . Shortness of breath      even walking causes sob...   . Arthritis     in back .   Marland Kitchen Pneumonia   . Borderline diabetes     Past Surgical History  Procedure Laterality Date  . Abdominal hysterectomy    . Toe surgery    . Dilation and curettage of uterus    . Breast surgery  unknown (many yrs)    excision  of lump right breast  was per pt "nothing"   . Cardiac catheterization    . Colonoscopy  04/16/2007    RMR: Normal rectum and colon    Current Outpatient Prescriptions  Medication Sig Dispense Refill  . CARAFATE 1 GM/10ML suspension Take 500 mg by mouth 2 (two) times daily.       Marland Kitchen gabapentin (NEURONTIN) 100 MG capsule Take 300 mg by mouth 3 (three) times daily.       Marland Kitchen HYDROcodone-acetaminophen (LORTAB) 7.5-500 MG per tablet Take 1 tablet by mouth every 6 (six) hours as  needed. For pain      . megestrol (MEGACE) 40 MG/ML suspension Take 800 mg by mouth daily. 800mg =4 teaspoons      . olmesartan-hydrochlorothiazide (BENICAR HCT) 40-25 MG per tablet Take 1 tablet by mouth daily.        Marland Kitchen omeprazole (PRILOSEC) 40 MG capsule Take 40 mg by mouth 2 (two) times daily.       . potassium chloride (K-DUR) 10 MEQ tablet Take 1 tablet (10 mEq total) by mouth daily.  30 tablet  6  . pravastatin (PRAVACHOL) 20 MG tablet Take 20 mg by mouth daily.       No current facility-administered medications for this visit.   Facility-Administered Medications Ordered in Other Visits  Medication Dose Route Frequency Provider Last Rate Last Dose  . lactated ringers infusion    Continuous PRN Turner Daniels, CRNA        Allergies as of 12/04/2012  . (No Known Allergies)    No family history on file.  History   Social History  . Marital Status: Widowed    Spouse Name: N/A    Number of Children: N/A  . Years of Education: N/A   Occupational History  . Not on file.   Social History Main Topics  .  Smoking status: Never Smoker   . Smokeless tobacco: Not on file  . Alcohol Use: No  . Drug Use: No  . Sexually Active: Yes    Birth Control/ Protection: Surgical   Other Topics Concern  . Not on file   Social History Narrative  . No narrative on file    Review of Systems: Gen: SEE HPI CV: Denies chest pain, heart palpitations, peripheral edema, syncope.  Resp: Denies shortness of breath at rest or with exertion. Denies wheezing or cough.  GI: SEE HPI GU : Denies urinary burning, urinary frequency, urinary hesitancy MS: back pain Derm: Denies rash, itching, dry skin Psych: Denies depression, anxiety, memory loss, and confusion Heme: Denies bruising, bleeding, and enlarged lymph nodes.  Physical Exam: BP 119/71  Pulse 78  Temp(Src) 97.6 F (36.4 C) (Oral)  Ht 5\' 4"  (1.626 m)  Wt 170 lb 9.6 oz (77.384 kg)  BMI 29.27 kg/m2 General:   Alert and oriented.  Pleasant and cooperative. Appears in discomfort.  Head:  Normocephalic and atraumatic. Eyes:  Without icterus, sclera clear and conjunctiva pink.  Ears:  Normal auditory acuity. Nose:  No deformity, discharge,  or lesions. Mouth:  No deformity or lesions, oral mucosa pink.  Neck:  Supple, without mass or thyromegaly. Lungs:  Clear to auscultation bilaterally. No wheezes, rales, or rhonchi. No distress.  Heart:  S1, S2 present without murmurs appreciated.  Abdomen:  +BS, soft, TTP lower abdomen and non-distended. No HSM noted. No guarding or rebound. No masses appreciated.  Rectal:  Deferred  Msk:  Symmetrical without gross deformities. Normal posture. Extremities:  Without clubbing or edema. Neurologic:  Alert and  oriented x4;  grossly normal neurologically. Skin:  Intact without significant lesions or rashes. Cervical Nodes:  No significant cervical adenopathy. Psych:  Alert and cooperative. Normal mood and affect.

## 2012-12-05 LAB — CBC
HCT: 39 %
HGB: 12.8 g/dL
MCH: 30
MCHC: 32.7
MCV: 91.8 fL
RDW: 13.9
platelet count: 674

## 2012-12-05 LAB — COMPREHENSIVE METABOLIC PANEL
ALT: 19 U/L (ref 7–35)
AST: 15 U/L
Alkaline Phosphatase: 55 U/L
BUN: 22 mg/dL — AB (ref 4–21)
Creat: 1.26
HELICOBACTER PYLORI AB, IGA: NORMAL
Total Bilirubin: 0.4 mg/dL

## 2012-12-08 NOTE — Progress Notes (Signed)
Quick Note:  Pt is aware. ______ 

## 2012-12-10 NOTE — Progress Notes (Signed)
Quick Note:  Spoke with Dr. Tyron Russell:   Celiac, SMA, IMA patent. No concern for mesenteric ischemia.  Appendix was upper limits of normal (7mm, with a "cut-off" of 6), but NO inflammatory changes  Last colonoscopy was in 2008. Her symptoms are lower abdominal but no other features such as rectal bleeding, changes in bowel habits. She does note abdominal bloating intermittently. She also had a strongly positive H.plyori serology. I am not sure if this was treated. Due to weight loss, lack of appetite, dyspepsia, abdominal pain, I recommend the following:  EGD/TCS with Dr. Jena Gauss Obtain UA with culture reflex  ______

## 2012-12-10 NOTE — Progress Notes (Signed)
REVIEWED CT, nothing to explain abdominal pain.   Spoke with Dr. Tyron Russell:  Celiac, SMA, IMA patent. No concern for mesenteric ischemia.  Appendix was upper limits of normal (7mm, with a "cut-off" of 6), but NO inflammatory changes  Last colonoscopy was in 2008. Her symptoms are lower abdominal but no other features such as rectal bleeding, changes in bowel habits. She does note abdominal bloating intermittently. She also had a strongly positive H.plyori serology. I am not sure if this was treated. Due to weight loss, lack of appetite, dyspepsia, abdominal pain, I recommend the following:  EGD/TCS with Dr. Jena Gauss Obtain UA with culture reflex

## 2012-12-16 ENCOUNTER — Other Ambulatory Visit: Payer: Self-pay

## 2012-12-16 ENCOUNTER — Other Ambulatory Visit: Payer: Self-pay | Admitting: Gastroenterology

## 2012-12-16 DIAGNOSIS — R109 Unspecified abdominal pain: Secondary | ICD-10-CM

## 2012-12-16 NOTE — Progress Notes (Signed)
Quick Note:  Pt is aware. Leighann, please schedule egd/tcs. Lab order faxed to the lab. ______

## 2012-12-16 NOTE — Progress Notes (Signed)
Patient will need to be retriaged due to Dr.Rourk being gone for 2 wks and her H&P is only good until 07/04

## 2012-12-17 LAB — URINALYSIS W MICROSCOPIC + REFLEX CULTURE
Bilirubin Urine: NEGATIVE
Casts: NONE SEEN
Crystals: NONE SEEN
Glucose, UA: NEGATIVE mg/dL
Hgb urine dipstick: NEGATIVE
Ketones, ur: NEGATIVE mg/dL
Nitrite: NEGATIVE
Protein, ur: NEGATIVE mg/dL
Specific Gravity, Urine: 1.028 (ref 1.005–1.030)
Urobilinogen, UA: 1 mg/dL (ref 0.0–1.0)
pH: 7 (ref 5.0–8.0)

## 2012-12-17 NOTE — Progress Notes (Signed)
Pt called and said for [place] to call [phone] this is her grand-daughter's number because her number is not working

## 2012-12-18 ENCOUNTER — Other Ambulatory Visit: Payer: Self-pay | Admitting: Internal Medicine

## 2012-12-18 DIAGNOSIS — R109 Unspecified abdominal pain: Secondary | ICD-10-CM

## 2012-12-18 MED ORDER — PEG 3350-KCL-NA BICARB-NACL 420 G PO SOLR: 4000.0000 mL | ORAL | Status: DC

## 2012-12-18 NOTE — Progress Notes (Signed)
Patient is scheduled with RMR on 07/10 and I have mailed her the instructions

## 2012-12-19 LAB — URINE CULTURE: Colony Count: 50000

## 2012-12-23 DIAGNOSIS — M47817 Spondylosis without myelopathy or radiculopathy, lumbosacral region: Secondary | ICD-10-CM | POA: Diagnosis not present

## 2012-12-23 DIAGNOSIS — M545 Low back pain, unspecified: Secondary | ICD-10-CM | POA: Diagnosis not present

## 2012-12-23 DIAGNOSIS — M5126 Other intervertebral disc displacement, lumbar region: Secondary | ICD-10-CM | POA: Diagnosis not present

## 2012-12-25 MED ORDER — CIPROFLOXACIN HCL 250 MG PO TABS: 250.0000 mg | ORAL_TABLET | Freq: Two times a day (BID) | ORAL | Status: DC

## 2012-12-25 NOTE — Addendum Note (Signed)
Addended by: Nira Retort on: 12/25/2012 01:21 PM   Modules accepted: Orders

## 2012-12-25 NOTE — Progress Notes (Signed)
+  UTI, E.coli. I have sent in Cipro BID X 3 days  Please let patient know.

## 2012-12-26 NOTE — Progress Notes (Signed)
Quick Note:  Tried to call pt- LMOM ______ 

## 2012-12-29 NOTE — Progress Notes (Signed)
Quick Note:  Tried to call pt- call would not go through ______ 

## 2012-12-31 ENCOUNTER — Other Ambulatory Visit: Payer: Self-pay | Admitting: Internal Medicine

## 2012-12-31 MED ORDER — PEG 3350-KCL-NA BICARB-NACL 420 G PO SOLR: 4000.0000 mL | ORAL | Status: DC

## 2013-01-06 DIAGNOSIS — R109 Unspecified abdominal pain: Secondary | ICD-10-CM | POA: Diagnosis not present

## 2013-01-06 DIAGNOSIS — E119 Type 2 diabetes mellitus without complications: Secondary | ICD-10-CM | POA: Diagnosis not present

## 2013-01-06 DIAGNOSIS — M549 Dorsalgia, unspecified: Secondary | ICD-10-CM | POA: Diagnosis not present

## 2013-01-08 ENCOUNTER — Encounter (HOSPITAL_COMMUNITY): Payer: Self-pay | Admitting: *Deleted

## 2013-01-08 ENCOUNTER — Ambulatory Visit (HOSPITAL_COMMUNITY)
Admission: RE | Admit: 2013-01-08 | Discharge: 2013-01-08 | Disposition: A | Discharge status: Home / Self Care | Payer: Medicare Other | Source: Ambulatory Visit | Attending: Internal Medicine | Admitting: Internal Medicine

## 2013-01-08 ENCOUNTER — Encounter (HOSPITAL_COMMUNITY)
Admission: RE | Disposition: A | Discharge status: Home / Self Care | Payer: Self-pay | Source: Ambulatory Visit | Attending: Internal Medicine

## 2013-01-08 DIAGNOSIS — K319 Disease of stomach and duodenum, unspecified: Secondary | ICD-10-CM | POA: Insufficient documentation

## 2013-01-08 DIAGNOSIS — K2940 Chronic atrophic gastritis without bleeding: Secondary | ICD-10-CM | POA: Insufficient documentation

## 2013-01-08 DIAGNOSIS — R1032 Left lower quadrant pain: Secondary | ICD-10-CM | POA: Diagnosis not present

## 2013-01-08 DIAGNOSIS — K449 Diaphragmatic hernia without obstruction or gangrene: Secondary | ICD-10-CM | POA: Diagnosis not present

## 2013-01-08 DIAGNOSIS — K3189 Other diseases of stomach and duodenum: Secondary | ICD-10-CM | POA: Insufficient documentation

## 2013-01-08 DIAGNOSIS — K2100 Gastro-esophageal reflux disease with esophagitis, without bleeding: Secondary | ICD-10-CM | POA: Diagnosis not present

## 2013-01-08 DIAGNOSIS — Q438 Other specified congenital malformations of intestine: Secondary | ICD-10-CM | POA: Diagnosis not present

## 2013-01-08 DIAGNOSIS — R141 Gas pain: Secondary | ICD-10-CM | POA: Insufficient documentation

## 2013-01-08 DIAGNOSIS — R634 Abnormal weight loss: Secondary | ICD-10-CM | POA: Diagnosis not present

## 2013-01-08 DIAGNOSIS — D126 Benign neoplasm of colon, unspecified: Secondary | ICD-10-CM | POA: Diagnosis not present

## 2013-01-08 DIAGNOSIS — R109 Unspecified abdominal pain: Secondary | ICD-10-CM

## 2013-01-08 DIAGNOSIS — R142 Eructation: Secondary | ICD-10-CM | POA: Insufficient documentation

## 2013-01-08 HISTORY — PX: COLONOSCOPY WITH ESOPHAGOGASTRODUODENOSCOPY (EGD): SHX5779

## 2013-01-08 LAB — GLUCOSE, CAPILLARY: Glucose-Capillary: 85 mg/dL (ref 70–99)

## 2013-01-08 SURGERY — COLONOSCOPY WITH ESOPHAGOGASTRODUODENOSCOPY (EGD)
Anesthesia: Moderate Sedation

## 2013-01-08 MED ORDER — SODIUM CHLORIDE 0.9 % IV SOLN
INTRAVENOUS | Status: DC
  Administered 2013-01-08: 07:00:00 via INTRAVENOUS

## 2013-01-08 MED ORDER — MEPERIDINE HCL 100 MG/ML IJ SOLN
INTRAMUSCULAR | Status: DC | PRN
  Administered 2013-01-08: 50 mg via INTRAVENOUS
  Administered 2013-01-08: 25 mg via INTRAVENOUS

## 2013-01-08 MED ORDER — ONDANSETRON HCL 4 MG/2ML IJ SOLN
INTRAMUSCULAR | Status: AC
  Filled 2013-01-08: qty 2

## 2013-01-08 MED ORDER — MIDAZOLAM HCL 5 MG/5ML IJ SOLN
INTRAMUSCULAR | Status: AC
  Filled 2013-01-08: qty 10

## 2013-01-08 MED ORDER — STERILE WATER FOR IRRIGATION IR SOLN
Status: DC | PRN
  Administered 2013-01-08: 08:00:00

## 2013-01-08 MED ORDER — ONDANSETRON HCL 4 MG/2ML IJ SOLN
INTRAMUSCULAR | Status: DC | PRN
  Administered 2013-01-08: 4 mg via INTRAVENOUS

## 2013-01-08 MED ORDER — MIDAZOLAM HCL 5 MG/5ML IJ SOLN
INTRAMUSCULAR | Status: DC | PRN
  Administered 2013-01-08 (×3): 1 mg via INTRAVENOUS
  Administered 2013-01-08: 2 mg via INTRAVENOUS

## 2013-01-08 MED ORDER — MEPERIDINE HCL 100 MG/ML IJ SOLN
INTRAMUSCULAR | Status: AC
  Filled 2013-01-08: qty 2

## 2013-01-08 MED ORDER — MEPERIDINE HCL 50 MG/ML IJ SOLN
INTRAMUSCULAR | Status: AC
  Filled 2013-01-08: qty 1

## 2013-01-08 NOTE — H&P (View-Only) (Signed)
Quick Note:  Spoke with Dr. Tyron Russell:   Celiac, SMA, IMA patent. No concern for mesenteric ischemia.  Appendix was upper limits of normal (7mm, with a "cut-off" of 6), but NO inflammatory changes  Last colonoscopy was in 2008. Her symptoms are lower abdominal but no other features such as rectal bleeding, changes in bowel habits. She does note abdominal bloating intermittently. She also had a strongly positive H.plyori serology. I am not sure if this was treated. Due to weight loss, lack of appetite, dyspepsia, abdominal pain, I recommend the following:  EGD/TCS with Dr. Jena Gauss Obtain UA with culture reflex  ______

## 2013-01-08 NOTE — Op Note (Addendum)
[facility] [address] [place], [address]   ENDOSCOPY PROCEDURE REPORT  PATIENT: Audrey Johnson, Audrey Johnson  MR#: [id number] BIRTHDATE: 12-22-1940 , 72  yrs. old GENDER: Female ENDOSCOPIST: R.  Roetta Sessions, MD FACP FACG REFERRED BY:  Catalina Pizza, M.D. PROCEDURE DATE:  01/08/2013 PROCEDURE:    EGD with gastric biopsy Indications:     Dyspepsia/abdominal pain. History of positive H. pylori serologies (eradication uncertain). Recent treatment for Escherichia coli UTI have not helped symptoms.  INFORMED CONSENT:   The risks, benefits, limitations, alternatives and imponderables have been discussed.  The potential for biopsy, esophogeal dilation, etc. have also been reviewed.  Questions have been answered.  All parties agreeable.  Please see the history and physical in the medical record for more information.  MEDICATIONS: Versed 3 mg IV and Demerol 75 mg IV in divided doses. Zofran 4 mg IV. Cetacaine spray.  DESCRIPTION OF PROCEDURE:   The EG-2990i ([id number])  endoscope was introduced through the mouth and advanced to the second portion of the duodenum without difficulty or limitations.  The mucosal surfaces were surveyed very carefully during advancement of the scope and upon withdrawal.  Retroflexion view of the proximal stomach and esophagogastric junction was performed.      FINDINGS: Small mucosal breaks just above the GE junction consistent with mild erosive reflux esophagitis. No Barrett's esophagus. Stomach empty. Small hiatal hernia. Mildly reticulated appearance of the gastric mucosa. No ulcer or infiltrating process. Patent pylorus. Normal first and second portion of the duodenum.  THERAPEUTIC / DIAGNOSTIC MANEUVERS PERFORMED:  biopsies the gastric antrum and body taken for histologic study   COMPLICATIONS:  None  IMPRESSION:  Mild erosive reflux esophagitis. Small hiatal hernia. Abnormal gastric mucosa of uncertain significance-status  post biopsy.  RECOMMENDATIONS:   Continue omeprazole for GERD. Followup on pathology. See colonoscopy report.    _______________________________ R. Roetta Sessions, MD FACP [facility] eSigned:  R. Roetta Sessions, MD FACP [facility] 01/08/2013 8:58 AM Revised: 01/08/2013 8:58 AM    CC:

## 2013-01-08 NOTE — Interval H&P Note (Signed)
History and Physical Interval Note:  01/08/2013 7:54 AM  Audrey Johnson  has presented today for surgery, with the diagnosis of Abdominal Pain  The various methods of treatment have been discussed with the patient and family. After consideration of risks, benefits and other options for treatment, the patient has consented to  Procedure(s) with comments: COLONOSCOPY WITH ESOPHAGOGASTRODUODENOSCOPY (EGD) (N/A) - 7:30 as a surgical intervention .  The patient's history has been reviewed, patient examined, no change in status, stable for surgery.  I have reviewed the patient's chart and labs.  Questions were answered to the patient's satisfaction.     Eula Listen  Patient treated for Escherichia coli UTI. Has not helped left-sided abdominal/flank pain. No history of shingles. No rash. No recent GYN evaluation.  EGD colonoscopy per plan.

## 2013-01-08 NOTE — Interval H&P Note (Signed)
History and Physical Interval Note:  01/08/2013 7:35 AM  Audrey Johnson  has presented today for surgery, with the diagnosis of Abdominal Pain  The various methods of treatment have been discussed with the patient and family. After consideration of risks, benefits and other options for treatment, the patient has consented to  Procedure(s) with comments: COLONOSCOPY WITH ESOPHAGOGASTRODUODENOSCOPY (EGD) (N/A) - 7:30 as a surgical intervention .  The patient's history has been reviewed, patient examined, no change in status, stable for surgery.  I have reviewed the patient's chart and labs.  Questions were answered to the patient's satisfaction.     Eula Listen

## 2013-01-08 NOTE — Op Note (Signed)
[facility] [address] [place], [address]   COLONOSCOPY PROCEDURE REPORT  PATIENT: [name], [name]  MR#:         [id number] BIRTHDATE: Oct 23, 1940 , 72  yrs. old GENDER: Female ENDOSCOPIST: R.  Roetta Sessions, MD FACP FACG REFERRED BY:  Catalina Pizza, M.D. PROCEDURE DATE:  01/08/2013 PROCEDURE:     ileocolonoscopy with biopsy  INDICATIONS: Screening / left lower quadrant abdominal pain  INFORMED CONSENT:  The risks, benefits, alternatives and imponderables including but not limited to bleeding, perforation as well as the possibility of a missed lesion have been reviewed.  The potential for biopsy, lesion removal, etc. have also been discussed.  Questions have been answered.  All parties agreeable. Please see the history and physical in the medical record for more information.  MEDICATIONS: Versed 5 mg IV and Demerol 75 mg IV in divided doses. Zofran 4 mg IV  DESCRIPTION OF PROCEDURE:  After a digital rectal exam was performed, the EG-2990i ([id number]) and EC-3890Li ([id number]) colonoscope was advanced from the anus through the rectum and colon to the area of the cecum, ileocecal valve and appendiceal orifice. The cecum was deeply intubated.  These structures were well-seen and photographed for the record.  From the level of the cecum and ileocecal valve, the scope was slowly and cautiously withdrawn. The mucosal surfaces were carefully surveyed utilizing scope tip deflection to facilitate fold flattening as needed.  The scope was pulled down into the rectum where a thorough examination including retroflexion was performed.    FINDINGS:  Adequate preparation. Normal rectum. (1) polyp in the mid sigmoid segment; otherwise the remainder of colonic mucosa appeared normal. Tortuous colon. Distal 5 cm of terminal ileum mucosa appeared normal.  THERAPEUTIC / DIAGNOSTIC MANEUVERS PERFORMED:  The above-mentioned polyps cold  biopsied/removed  COMPLICATIONS: None  CECAL WITHDRAWAL TIME:  7 minutes  IMPRESSION:  Colonic polyp-removed as described above  RECOMMENDATIONS: Followup on pathology. See EGD report.   _______________________________ eSigned:  R. Roetta Sessions, MD FACP [facility] 01/08/2013 8:43 AM   CC:    PATIENT NAME:  Audrey Johnson, Audrey Johnson MR#: [id number]

## 2013-01-08 NOTE — H&P (View-Only) (Signed)
REVIEWED CT, nothing to explain abdominal pain.   Spoke with Dr. Tyron Russell:  Celiac, SMA, IMA patent. No concern for mesenteric ischemia.  Appendix was upper limits of normal (7mm, with a "cut-off" of 6), but NO inflammatory changes  Last colonoscopy was in 2008. Her symptoms are lower abdominal but no other features such as rectal bleeding, changes in bowel habits. She does note abdominal bloating intermittently. She also had a strongly positive H.plyori serology. I am not sure if this was treated. Due to weight loss, lack of appetite, dyspepsia, abdominal pain, I recommend the following:  EGD/TCS with Dr. Jena Gauss Obtain UA with culture reflex

## 2013-01-12 ENCOUNTER — Encounter (HOSPITAL_COMMUNITY): Payer: Self-pay | Admitting: Internal Medicine

## 2013-01-19 ENCOUNTER — Other Ambulatory Visit (HOSPITAL_COMMUNITY): Payer: Self-pay | Admitting: Internal Medicine

## 2013-01-19 DIAGNOSIS — Z139 Encounter for screening, unspecified: Secondary | ICD-10-CM

## 2013-01-23 ENCOUNTER — Encounter: Payer: Self-pay | Admitting: Internal Medicine

## 2013-01-23 NOTE — Progress Notes (Unsigned)
Letter mailed to pt.  Darl Pikes, please schedule.

## 2013-01-23 NOTE — Progress Notes (Unsigned)
Letter from: Corbin Ade  Reason for Letter: Results Review Send letter to patient.  Send copy of letter with path to referring provider and PCP.  Patient needs a followup appointment with an extender in the next 3-4 weeks to reassess symptoms

## 2013-01-26 ENCOUNTER — Encounter: Payer: Self-pay | Admitting: Internal Medicine

## 2013-01-26 NOTE — Progress Notes (Signed)
Pt is aware of OV on 8/25 at 11 with AS and appt card was mailed

## 2013-01-29 ENCOUNTER — Telehealth: Payer: Self-pay | Admitting: *Deleted

## 2013-01-29 NOTE — Telephone Encounter (Signed)
Pt is scheduled with [facility] on 8/25 having abd. Pain and wants to be seen sooner. No available appt. Please advise.

## 2013-01-29 NOTE — Telephone Encounter (Signed)
Spoke with pt- she stated the pain has not gone away, no vomiting, some nausea at times. Last BM this morning and it was loose. Pt has had episodes of feeling cold and having chills to feeling hot, she is not sure if she had a fever or not. She stated she finished her abx for the UTI.   Pt wants to know if she can be seen sooner or if we can give her something for the pain. Please advise.

## 2013-01-29 NOTE — Telephone Encounter (Signed)
She has had a thorough evaluation (TCS/EGD, CT scan, treated for UTI without improvement).  Has she ever received treatment for H.pylori? Serologies were positive. If she hasn't, I think this should be done at some point, although this does not explain current pain.  Appears she has had a hysterectomy as well. Ovaries remain? May benefit from GYN consultation and seeing PCP again prior to appt with [place]. No evidence of mesenteric ischemia on recent CT. I may end up referring her to Gen Surg for laparoscopy with interval appendectomy if this seems to be the problem.

## 2013-01-30 MED ORDER — BIS SUBCIT-METRONID-TETRACYC 140-125-125 MG PO CAPS: 3.0000 | ORAL_CAPSULE | Freq: Three times a day (TID) | ORAL | Status: DC

## 2013-01-30 MED ORDER — AMOXICILL-CLARITHRO-LANSOPRAZ PO MISC: Freq: Two times a day (BID) | ORAL | Status: DC

## 2013-01-30 NOTE — Telephone Encounter (Signed)
Pt stated she has not been treated for hpylori and she would like to go ahead and be treated. She is not allergic to anything and would like medication sent to [facility].  Pt stated she had a total hysterectomy.

## 2013-01-30 NOTE — Telephone Encounter (Signed)
Hold Pravachol while taking Prevpac, then resume. I have attempted to contact patient. I have not been able to reach her. I will hold off on sending this to the pharmacy until we can notify her of this.   Once we reach patient and can give her the instructions, I will send in then. I contacted pharmacy and asked them to cancel both prescriptions until we can talk to patient herself regarding administration.

## 2013-01-30 NOTE — Telephone Encounter (Signed)
I sent in prescription for Pylera. Take Prilosec BID while on Pylera (I think she is taking this anyway).

## 2013-01-30 NOTE — Telephone Encounter (Signed)
Spoke with pt- her insurance will not cover pylera.

## 2013-02-02 ENCOUNTER — Telehealth: Payer: Self-pay | Admitting: Internal Medicine

## 2013-02-02 MED ORDER — AMOXICILL-CLARITHRO-LANSOPRAZ PO MISC: Freq: Two times a day (BID) | ORAL | Status: DC

## 2013-02-02 NOTE — Telephone Encounter (Signed)
Pt called this afternoon to see if we had seen a prescription in for her at [facility] in [place]. She had spoke with nurse earlier today. Please call [phone]

## 2013-02-02 NOTE — Telephone Encounter (Signed)
Done

## 2013-02-02 NOTE — Telephone Encounter (Signed)
Prevpac sent to pharmacy.  

## 2013-02-02 NOTE — Telephone Encounter (Signed)
Pt needs prevpac sent to pharmacy please.

## 2013-02-02 NOTE — Telephone Encounter (Signed)
Pt came by the office this morning and I informed her that she should not take her cholesterol medicine with the hpylori abx and she verbalized understanding. She is aware that we will be sending the rx to the pharmacy today.

## 2013-02-03 ENCOUNTER — Telehealth: Payer: Self-pay | Admitting: *Deleted

## 2013-02-03 NOTE — Telephone Encounter (Signed)
Agree 

## 2013-02-03 NOTE — Telephone Encounter (Signed)
Pt states pharmacy hasn't received RX, [facility] pharmacy in [place].

## 2013-02-03 NOTE — Telephone Encounter (Signed)
Received PA request from pharmacy, insurance doesn't want to pay for prevpac either.Called [facility] pharmacy- LM with rx for amox 1g bid x14days and biaxin 500mg  bid x14 days. Per AS ok for pt to take omeprazole bid with this instead of prevacid. Pt is already taking omeprazole bid.

## 2013-02-05 DIAGNOSIS — M5126 Other intervertebral disc displacement, lumbar region: Secondary | ICD-10-CM | POA: Diagnosis not present

## 2013-02-05 DIAGNOSIS — M545 Low back pain, unspecified: Secondary | ICD-10-CM | POA: Diagnosis not present

## 2013-02-05 DIAGNOSIS — M47817 Spondylosis without myelopathy or radiculopathy, lumbosacral region: Secondary | ICD-10-CM | POA: Diagnosis not present

## 2013-02-07 NOTE — H&P (Signed)
Primary Care Physician:  Catalina Pizza, MD Primary Gastroenterologist:  Dr. Jena Gauss     Chief Complaint   Patient presents with   .  Abdominal Pain       burn        HPI:    Audrey Johnson is a pleasant 72 year old female who presents today at the request of Dr. Catalina Pizza secondary to abdominal pain. Upon entering room, she is in obvious discomfort, holding her lower abdomen. States pain is chronic, intermittent, onset about 2 years ago. Located lower abdomen. No epigastric pain. Sometimes radiates around to back.  Intermittent, wakes her up about 3am. No pain after eating. Hurting now. Noted as burning. No N/V. No constipation or diarrhea. BM early every morning. Thnks she has abdominal bloating every once in awhile. No rectal bleeding. +weight loss, decreased appetite. Thinks she has lost about 10 lbs in the past few months. No GERD. No dysphagia.    Review of recent labs from April 2014 reveal H.pylori serology strongly positive at > 8.00. LFTs normal at that time. Hgb normal at 12.8, Plts elevated at 647.     Past Medical History   Diagnosis  Date   .  Hypertension     .  High cholesterol     .  Chronic back pain     .  Anxiety     .  Shortness of breath          even walking causes sob...    .  Arthritis         in back .    Marland Kitchen  Pneumonia     .  Borderline diabetes           Past Surgical History   Procedure  Laterality  Date   .  Abdominal hysterectomy       .  Toe surgery       .  Dilation and curettage of uterus       .  Breast surgery    unknown (many yrs)       excision  of lump right breast  was per pt "nothing"    .  Cardiac catheterization       .  Colonoscopy    04/16/2007       RMR: Normal rectum and colon         Current Outpatient Prescriptions   Medication  Sig  Dispense  Refill   .  CARAFATE 1 GM/10ML suspension  Take 500 mg by mouth 2 (two) times daily.          Marland Kitchen  gabapentin (NEURONTIN) 100 MG capsule  Take 300 mg by mouth 3 (three) times  daily.          Marland Kitchen  HYDROcodone-acetaminophen (LORTAB) 7.5-500 MG per tablet  Take 1 tablet by mouth every 6 (six) hours as needed. For pain         .  megestrol (MEGACE) 40 MG/ML suspension  Take 800 mg by mouth daily. 800mg =4 teaspoons         .  olmesartan-hydrochlorothiazide (BENICAR HCT) 40-25 MG per tablet  Take 1 tablet by mouth daily.           Marland Kitchen  omeprazole (PRILOSEC) 40 MG capsule  Take 40 mg by mouth 2 (two) times daily.          .  potassium chloride (K-DUR) 10 MEQ tablet  Take 1 tablet (10 mEq total) by mouth daily.  30 tablet   6   .  pravastatin (PRAVACHOL) 20 MG tablet  Take 20 mg by mouth daily.               Allergies as of 12/04/2012   .  (No Known Allergies)        No family history on file.    History       Social History   .  Marital Status:  Widowed       Spouse Name:  N/A       Number of Children:  N/A   .  Years of Education:  N/A       Occupational History   .  Not on file.       Social History Main Topics   .  Smoking status:  Never Smoker    .  Smokeless tobacco:  Not on file   .  Alcohol Use:  No   .  Drug Use:  No   .  Sexually Active:  Yes       Birth Control/ Protection:  Surgical       Other Topics  Concern   .  Not on file       Social History Narrative   .  No narrative on file        Review of Systems: Gen: SEE HPI CV: Denies chest pain, heart palpitations, peripheral edema, syncope.   Resp: Denies shortness of breath at rest or with exertion. Denies wheezing or cough.   GI: SEE HPI GU : Denies urinary burning, urinary frequency, urinary hesitancy MS: back pain Derm: Denies rash, itching, dry skin Psych: Denies depression, anxiety, memory loss, and confusion Heme: Denies bruising, bleeding, and enlarged lymph nodes.   Physical Exam:  General:   Alert and oriented. Pleasant and cooperative. Appears in discomfort.   Head:  Normocephalic and atraumatic. Eyes:  Without icterus, sclera clear and conjunctiva pink.    Ears:  Normal auditory acuity. Nose:  No deformity, discharge,  or lesions. Mouth:  No deformity or lesions, oral mucosa pink.   Neck:  Supple, without mass or thyromegaly. Lungs:  Clear to auscultation bilaterally. No wheezes, rales, or rhonchi. No distress.   Heart:  S1, S2 present without murmurs appreciated.   Abdomen:  +BS, soft, TTP lower abdomen and non-distended. No HSM noted. No guarding or rebound. No masses appreciated.   Rectal:  Deferred   Msk:  Symmetrical without gross deformities. Normal posture. Extremities:  Without clubbing or edema. Neurologic:  Alert and  oriented x4;  grossly normal neurologically. Skin:  Intact without significant lesions or rashes. Cervical Nodes:  No significant cervical adenopathy. Psych:  Alert and cooperative. Normal mood and affect.       Impression:       72 year old female referred by Dr. Margo Aye due to abdominal pain, last colonoscopy normal in 2008. At presentation, she is holding her lower abdomen and obviously uncomfortable. Denies any other associated symptoms such as N/V, rectal bleeding, change in bowel habits. Interestingly, she notes loss of appetite recently with self-reported weight loss of around 10 lbs. I do not have documentation to verify this. Abdominal pain is intermittent, but this also wakes her in the early morning hours. I noted H.pylori serology was strongly positive. She has no upper GI symptoms other than loss of appetite. Her presentation is not quite typical for ulcer disease, but this is unable to be ruled out. Definitely will need treatment for H.pylori in the future,  EGD in near future as well due to loss of appetite and weight loss.   CT negatiive.  Recommendations:  EGD and TCS.  The risks, benefits, limitations, imponderables and alternatives regarding both EGD and colonoscopy have been reviewed with the patient. Questions have been answered. All parties agreeable.

## 2013-02-08 ENCOUNTER — Other Ambulatory Visit: Payer: Self-pay | Admitting: Cardiology

## 2013-02-09 NOTE — Telephone Encounter (Signed)
LMTCB; Patient needs an appointment - was supposed to f/u March 2014 per Dr. Riley Kill and patient has not been seen since 12/13.

## 2013-02-12 NOTE — Telephone Encounter (Signed)
Medication refilled for #30 with no refills. Note sent to pharmacy that the pt needs to contact our office for appointment prior to future refills.

## 2013-02-19 ENCOUNTER — Ambulatory Visit (HOSPITAL_COMMUNITY): Payer: Medicare Other

## 2013-02-19 ENCOUNTER — Ambulatory Visit (HOSPITAL_COMMUNITY)
Admission: RE | Admit: 2013-02-19 | Discharge: 2013-02-19 | Disposition: A | Discharge status: Home / Self Care | Payer: Medicare Other | Source: Ambulatory Visit | Attending: Internal Medicine | Admitting: Internal Medicine

## 2013-02-19 DIAGNOSIS — Z139 Encounter for screening, unspecified: Secondary | ICD-10-CM

## 2013-02-19 DIAGNOSIS — Z1231 Encounter for screening mammogram for malignant neoplasm of breast: Secondary | ICD-10-CM | POA: Insufficient documentation

## 2013-02-20 ENCOUNTER — Ambulatory Visit (HOSPITAL_COMMUNITY): Payer: Medicare Other

## 2013-02-20 ENCOUNTER — Encounter: Payer: Self-pay | Admitting: Internal Medicine

## 2013-02-23 ENCOUNTER — Telehealth: Payer: Self-pay | Admitting: *Deleted

## 2013-02-23 ENCOUNTER — Encounter: Payer: Self-pay | Admitting: Gastroenterology

## 2013-02-23 ENCOUNTER — Ambulatory Visit (INDEPENDENT_AMBULATORY_CARE_PROVIDER_SITE_OTHER): Payer: Medicare Other | Admitting: Gastroenterology

## 2013-02-23 VITALS — BP 114/55 | HR 92 | Temp 97.2°F | Ht 68.0 in | Wt 172.0 lb

## 2013-02-23 DIAGNOSIS — R109 Unspecified abdominal pain: Secondary | ICD-10-CM

## 2013-02-23 MED ORDER — LINACLOTIDE 290 MCG PO CAPS: 1.0000 | ORAL_CAPSULE | Freq: Every day | ORAL | Status: DC

## 2013-02-23 NOTE — Assessment & Plan Note (Addendum)
With thorough work-up to include CTA negative for mesenteric ischemia, updated colonoscopy and EGD overall unrevealing as noted in HPI, and even treatment for positive H.pylori serologies have been completed. It appears her abdominal discomfort and bloating is due to constipation, which needs to be more aggressively treated. Will proceed with adding Linzess 290 mcg daily. Return in 4-6 weeks; follow a high fiber diet.

## 2013-02-23 NOTE — Progress Notes (Signed)
Referring Provider: Catalina Pizza, MD Primary Care Physician:  Catalina Pizza, MD Primary GI: Dr. Jena Gauss    Chief Complaint  Patient presents with  . Follow-up  . Abdominal Pain    swollen    HPI:   72 year old female presents today in follow-up with history of chronic abdominal pain, located lower abdomen. Occasional bloating. CTA negative for mesenteric ischemia. EGD/TCS performed with hyperplastic polyp, negative for H.pylori, + erosive reflux esophagitis. H.pylori serologies were positive from PCP: she was treated with generic Prevpac. UTI with E.coli. Treated with Cipro.   She is up 2 lbs from her last visit. States abdominal discomfort has returned. Feels like when she has to go to bathroom but "can't go", it starts. BM three times per week. Had BM this morning but still feels like she needs to go some more. Not taking anything. Feels constipated. No rectal bleeding or other concerning signs.    Past Medical History  Diagnosis Date  . Hypertension   . High cholesterol   . Chronic back pain   . Anxiety   . Shortness of breath      even walking causes sob...   . Arthritis     in back .   Marland Kitchen Pneumonia   . Borderline diabetes     Past Surgical History  Procedure Laterality Date  . Abdominal hysterectomy    . Toe surgery    . Dilation and curettage of uterus    . Breast surgery  unknown (many yrs)    excision  of lump right breast  was per pt "nothing"   . Cardiac catheterization    . Colonoscopy  04/16/2007    RMR: Normal rectum and colon  . Colonoscopy with esophagogastroduodenoscopy (egd) N/A 01/08/2013    WUJ:WJXB erosive reflux esophagitis. Negative H.pylori. Small hiatal hernia-s/p bx-TCS/Colonic polyp-hyperplastic    Current Outpatient Prescriptions  Medication Sig Dispense Refill  . CARAFATE 1 GM/10ML suspension Take 500 mg by mouth 2 (two) times daily.       Marland Kitchen gabapentin (NEURONTIN) 100 MG capsule Take 300 mg by mouth 3 (three) times daily.       Marland Kitchen  HYDROcodone-acetaminophen (LORTAB) 7.5-500 MG per tablet Take 1 tablet by mouth every 6 (six) hours as needed. For pain      . megestrol (MEGACE) 40 MG/ML suspension Take 800 mg by mouth daily. 800mg =4 teaspoons      . olmesartan-hydrochlorothiazide (BENICAR HCT) 40-25 MG per tablet Take 1 tablet by mouth daily.        Marland Kitchen omeprazole (PRILOSEC) 40 MG capsule Take 40 mg by mouth daily.       . potassium chloride (K-DUR,KLOR-CON) 10 MEQ tablet TAKE 1 TABLET (10 MEQ TOTAL)  BY MOUTH DAILY.  30 tablet  0  . pravastatin (PRAVACHOL) 20 MG tablet Take 20 mg by mouth daily.      Marland Kitchen amoxicillin-clarithromycin-lansoprazole (PREVPAC) combo pack Take by mouth 2 (two) times daily. Follow package directions.  1 kit  0  . ciprofloxacin (CIPRO) 250 MG tablet Take 1 tablet (250 mg total) by mouth 2 (two) times daily.  6 tablet  0  . polyethylene glycol-electrolytes (TRILYTE) 420 G solution Take 4,000 mLs by mouth as directed.  4000 mL  0  . polyethylene glycol-electrolytes (TRILYTE) 420 G solution Take 4,000 mLs by mouth as directed.  4000 mL  0  . [DISCONTINUED] potassium chloride (K-DUR) 10 MEQ tablet Take 1 tablet (10 mEq total) by mouth daily.  30 tablet  6   No  current facility-administered medications for this visit.   Facility-Administered Medications Ordered in Other Visits  Medication Dose Route Frequency Provider Last Rate Last Dose  . lactated ringers infusion    Continuous PRN Turner Daniels, CRNA        Allergies as of 02/23/2013  . (No Known Allergies)    Family History  Problem Relation Age of Onset  . Colon cancer Neg Hx     History   Social History  . Marital Status: Widowed    Spouse Name: N/A    Number of Children: N/A  . Years of Education: N/A   Social History Main Topics  . Smoking status: Never Smoker   . Smokeless tobacco: None  . Alcohol Use: No  . Drug Use: No  . Sexual Activity: Yes    Birth Control/ Protection: Surgical   Other Topics Concern  . None   Social  History Narrative  . None    Review of Systems: Negative unless mentioned in HPI.  Physical Exam: BP 114/55  Pulse 92  Temp(Src) 97.2 F (36.2 C) (Oral)  Ht 5\' 8"  (1.727 m)  Wt 172 lb (78.019 kg)  BMI 26.16 kg/m2 General:   Alert and oriented. No distress noted. Pleasant and cooperative.  Head:  Normocephalic and atraumatic. Eyes:  Conjuctiva clear without scleral icterus. Mouth:  Oral mucosa pink and moist. Good dentition. No lesions. Heart:  S1, S2 present without murmurs, rubs, or gallops. Regular rate and rhythm. Abdomen:  +BS, soft, non-tender and non-distended. No rebound or guarding. No HSM or masses noted. Msk:  Symmetrical without gross deformities. Normal posture. Extremities:  Without edema. Neurologic:  Alert and  oriented x4;  grossly normal neurologically. Skin:  Intact without significant lesions or rashes. Cervical Nodes:  No significant cervical adenopathy. Psych:  Alert and cooperative. Normal mood and affect.

## 2013-02-23 NOTE — Telephone Encounter (Signed)
Pt called and states the RX given with a coupon, pharmacy is stating they cannot take the coupon for the 30 day supply. Please advise [phone]

## 2013-02-23 NOTE — Telephone Encounter (Signed)
Spoke with Elijah Birk, Linz[employer] drug rep. He stated the vouchers are not illegal and [facility] should know that. He advised she take it to another pharmacy. Spoke with AS- she will send #30 days worth to [facility]. Pt is aware and agreeable to plan.

## 2013-02-23 NOTE — Patient Instructions (Addendum)
Start taking Linzess 1 capsule each morning on an empty stomach. This is for constipation. Take this every day, and avoid taking this with food as it can cause diarrhea.  Review the high fiber diet. It is important you follow this to help with your bowel habits.   We will see you back in 4-6 weeks  High-Fiber Diet Fiber is found in fruits, vegetables, and grains. A high-fiber diet encourages the addition of more whole grains, legumes, fruits, and vegetables in your diet. The recommended amount of fiber for adult males is 38 g per day. For adult females, it is 25 g per day. Pregnant and lactating women should get 28 g of fiber per day. If you have a digestive or bowel problem, ask your caregiver for advice before adding high-fiber foods to your diet. Eat a variety of high-fiber foods instead of only a select few type of foods.  PURPOSE  To increase stool bulk.  To make bowel movements more regular to prevent constipation.  To lower cholesterol.  To prevent overeating. WHEN IS THIS DIET USED?  It may be used if you have constipation and hemorrhoids.  It may be used if you have uncomplicated diverticulosis (intestine condition) and irritable bowel syndrome.  It may be used if you need help with weight management.  It may be used if you want to add it to your diet as a protective measure against atherosclerosis, diabetes, and cancer. SOURCES OF FIBER  Whole-grain breads and cereals.  Fruits, such as apples, oranges, bananas, berries, prunes, and pears.  Vegetables, such as green peas, carrots, sweet potatoes, beets, broccoli, cabbage, spinach, and artichokes.  Legumes, such split peas, soy, lentils.  Almonds. FIBER CONTENT IN FOODS Starches and Grains / Dietary Fiber (g)  Cheerios, 1 cup / 3 g  Corn Flakes cereal, 1 cup / 0.7 g  Rice crispy treat cereal, 1 cup / 0.3 g  Instant oatmeal (cooked),  cup / 2 g  Frosted wheat cereal, 1 cup / 5.1 g  Brown, long-grain rice  (cooked), 1 cup / 3.5 g  White, long-grain rice (cooked), 1 cup / 0.6 g  Enriched macaroni (cooked), 1 cup / 2.5 g Legumes / Dietary Fiber (g)  Baked beans (canned, plain, or vegetarian),  cup / 5.2 g  Kidney beans (canned),  cup / 6.8 g  Pinto beans (cooked),  cup / 5.5 g Breads and Crackers / Dietary Fiber (g)  Plain or honey graham crackers, 2 squares / 0.7 g  Saltine crackers, 3 squares / 0.3 g  Plain, salted pretzels, 10 pieces / 1.8 g  Whole-wheat bread, 1 slice / 1.9 g  White bread, 1 slice / 0.7 g  Raisin bread, 1 slice / 1.2 g  Plain bagel, 3 oz / 2 g  Flour tortilla, 1 oz / 0.9 g  Corn tortilla, 1 small / 1.5 g  Hamburger or hotdog bun, 1 small / 0.9 g Fruits / Dietary Fiber (g)  Apple with skin, 1 medium / 4.4 g  Sweetened applesauce,  cup / 1.5 g  Banana,  medium / 1.5 g  Grapes, 10 grapes / 0.4 g  Orange, 1 small / 2.3 g  Raisin, 1.5 oz / 1.6 g  Melon, 1 cup / 1.4 g Vegetables / Dietary Fiber (g)  Green beans (canned),  cup / 1.3 g  Carrots (cooked),  cup / 2.3 g  Broccoli (cooked),  cup / 2.8 g  Peas (cooked),  cup / 4.4 g  Mashed  potatoes,  cup / 1.6 g  Lettuce, 1 cup / 0.5 g  Corn (canned),  cup / 1.6 g  Tomato,  cup / 1.1 g Document Released: 06/18/2005 Document Revised: 12/18/2011 Document Reviewed: 09/20/2011 [facility] Patient Information 2014 [place], [place].

## 2013-02-23 NOTE — Telephone Encounter (Signed)
Done

## 2013-02-24 NOTE — Progress Notes (Signed)
CC'd to PCP 

## 2013-03-04 DIAGNOSIS — R109 Unspecified abdominal pain: Secondary | ICD-10-CM | POA: Diagnosis not present

## 2013-03-04 DIAGNOSIS — N390 Urinary tract infection, site not specified: Secondary | ICD-10-CM | POA: Diagnosis not present

## 2013-03-04 DIAGNOSIS — R42 Dizziness and giddiness: Secondary | ICD-10-CM | POA: Diagnosis not present

## 2013-03-12 ENCOUNTER — Other Ambulatory Visit (HOSPITAL_COMMUNITY): Payer: Self-pay | Admitting: Internal Medicine

## 2013-03-12 ENCOUNTER — Telehealth: Payer: Self-pay | Admitting: *Deleted

## 2013-03-12 DIAGNOSIS — K829 Disease of gallbladder, unspecified: Secondary | ICD-10-CM

## 2013-03-12 DIAGNOSIS — R109 Unspecified abdominal pain: Secondary | ICD-10-CM | POA: Diagnosis not present

## 2013-03-12 DIAGNOSIS — R110 Nausea: Secondary | ICD-10-CM | POA: Diagnosis not present

## 2013-03-12 DIAGNOSIS — R319 Hematuria, unspecified: Secondary | ICD-10-CM | POA: Diagnosis not present

## 2013-03-12 DIAGNOSIS — K219 Gastro-esophageal reflux disease without esophagitis: Secondary | ICD-10-CM | POA: Diagnosis not present

## 2013-03-12 NOTE — Telephone Encounter (Signed)
Spoke with pt- pcp gave her 2 different medications but she hasnt picked them up yet and she doesn't know what the names of them are. She asked that I call her back in the morning.

## 2013-03-12 NOTE — Telephone Encounter (Signed)
Since she is seeing PCP, they can address and see if she needs a UA or something of that nature. She has had a thorough GI work-up to include CTA, EGD and TCS. Let's have her call us back after she sees her PCP. Would consider Levsin or something of that nature if persistent.

## 2013-03-12 NOTE — Telephone Encounter (Signed)
Spoke with pt- she is c/o abd pain x 1 week., had some nausea/ vomiting yesterday. Last bm was today and it was normal ( just a little more runny than normal) pt is taking linzess. She has a follow up appt on 03/30/13 with AS. Pt also has an appt today with her pcp at 3pm. Pt wants to know if there is anything else she can do.

## 2013-03-12 NOTE — Telephone Encounter (Signed)
Pt called stating she was here 2 weeks ago, she got pills to help her bowls move, but her stomach is still hurting. Please advise [phone]

## 2013-03-13 ENCOUNTER — Ambulatory Visit (HOSPITAL_COMMUNITY): Admission: RE | Admit: 2013-03-13 | Payer: Medicare Other | Source: Ambulatory Visit

## 2013-03-13 NOTE — Telephone Encounter (Signed)
Spoke with pt, she was given dexilant and carafate. She is feeling ok right now and will call if it doesn't help.

## 2013-03-16 ENCOUNTER — Telehealth: Payer: Self-pay | Admitting: *Deleted

## 2013-03-16 NOTE — Telephone Encounter (Signed)
Pt states she was supposed to call Raynelle Fanning back if her stomach was still bothering her,pt states her stomach is still bothering her.  please advise [phone]

## 2013-03-16 NOTE — Telephone Encounter (Signed)
Spoke with pt- she is taking the carafate and dexilant that her pcp gave her but her stomach still hurts and she still has the same pain. She is still waiting for her results from her pcp about her UA. She is going to call them and see if her results are available.

## 2013-03-16 NOTE — Telephone Encounter (Signed)
Noted  

## 2013-03-18 ENCOUNTER — Ambulatory Visit (HOSPITAL_COMMUNITY)
Admission: RE | Admit: 2013-03-18 | Discharge: 2013-03-18 | Disposition: A | Discharge status: Home / Self Care | Payer: Medicare Other | Source: Ambulatory Visit | Attending: Internal Medicine | Admitting: Internal Medicine

## 2013-03-18 DIAGNOSIS — R109 Unspecified abdominal pain: Secondary | ICD-10-CM | POA: Diagnosis not present

## 2013-03-18 DIAGNOSIS — K829 Disease of gallbladder, unspecified: Secondary | ICD-10-CM

## 2013-03-20 ENCOUNTER — Other Ambulatory Visit (HOSPITAL_COMMUNITY): Payer: Medicare Other

## 2013-03-26 ENCOUNTER — Other Ambulatory Visit (HOSPITAL_COMMUNITY): Payer: Self-pay | Admitting: Internal Medicine

## 2013-03-26 ENCOUNTER — Ambulatory Visit (HOSPITAL_COMMUNITY)
Admission: RE | Admit: 2013-03-26 | Discharge: 2013-03-26 | Disposition: A | Discharge status: Home / Self Care | Payer: Medicare Other | Source: Ambulatory Visit | Attending: Internal Medicine | Admitting: Internal Medicine

## 2013-03-26 DIAGNOSIS — R112 Nausea with vomiting, unspecified: Secondary | ICD-10-CM | POA: Insufficient documentation

## 2013-03-26 DIAGNOSIS — N281 Cyst of kidney, acquired: Secondary | ICD-10-CM | POA: Diagnosis not present

## 2013-03-26 DIAGNOSIS — R109 Unspecified abdominal pain: Secondary | ICD-10-CM | POA: Insufficient documentation

## 2013-03-26 DIAGNOSIS — Q619 Cystic kidney disease, unspecified: Secondary | ICD-10-CM | POA: Diagnosis not present

## 2013-03-26 MED ORDER — IOHEXOL 300 MG/ML  SOLN
100.0000 mL | Freq: Once | INTRAMUSCULAR | Status: AC | PRN
  Administered 2013-03-26: 100 mL via INTRAVENOUS

## 2013-03-30 ENCOUNTER — Ambulatory Visit (HOSPITAL_COMMUNITY)
Admission: RE | Admit: 2013-03-30 | Discharge: 2013-03-30 | Disposition: A | Discharge status: Home / Self Care | Payer: Medicare Other | Source: Ambulatory Visit | Attending: Gastroenterology | Admitting: Gastroenterology

## 2013-03-30 ENCOUNTER — Encounter: Payer: Self-pay | Admitting: Gastroenterology

## 2013-03-30 ENCOUNTER — Ambulatory Visit (INDEPENDENT_AMBULATORY_CARE_PROVIDER_SITE_OTHER): Payer: Medicare Other | Admitting: Gastroenterology

## 2013-03-30 VITALS — BP 132/70 | HR 102 | Temp 97.9°F | Ht 64.0 in | Wt 164.8 lb

## 2013-03-30 DIAGNOSIS — R109 Unspecified abdominal pain: Secondary | ICD-10-CM

## 2013-03-30 NOTE — Progress Notes (Signed)
Referring Provider: Catalina Pizza, MD Primary Care Physician:  Catalina Pizza, MD Primary GI: Dr. Jena Gauss   Chief Complaint  Patient presents with  . Follow-up  . Abdominal Pain    HPI:   Audrey Johnson presents today in follow-up due to chronic abdominal pain. Started on Linzess at last appt due to question of constipation causing symptoms. Prior work-up includes: CT negative for mesenteric ischemia June 2014; this was reviewed with Dr. Tyron Russell with celiac, SMA, and IMA patent. EGD/TCS performed with hyperplastic polyp, negative for H.pylori, + erosive reflux esophagitis. H.pylori serologies were positive from PCP: she was treated with generic Prevpac. UTI with E.coli in the past and treated with Cipro. Underwent US of abdomen on Sept 17 which was normal. CT 9/25 without acute process, evidence of hydronephrosis, pericecal inflammation, colitis, or diverticulitis.   Abdominal pain somewhat vague, with reports of suprapubic pain radiation around LLQ to left flank; however, she also states pain emanates from left flank/lower back and radiates towards her front. Notes severe back pain, leg pain. Some paresthesias lower extremities. No bowel or bladder incontinence. When she starts hurting, gets "real hot", feels like she is getting sick on her stomach. Had diarrhea last week while taking Linzess. BM every few days. No real improvement after BM. Feels like pain is constant. States pain occurs with eating as well.   As of note, she was scheduled for L4-5 lumbar laminotomy and microdiscectomy Jan 2014 with Dr. Newell Coral; however, this was cancelled day of surgery due to pain in the left side of lower back instead of the previously described right lumbar radicular pain. Surgery cancelled, and she was advised to return as an outpatient to see Dr. Newell Coral.   Past Medical History  Diagnosis Date  . Hypertension   . High cholesterol   . Chronic back pain   . Anxiety   . Shortness of breath      even walking  causes sob...   . Arthritis     in back .   Marland Kitchen Pneumonia   . Borderline diabetes     Past Surgical History  Procedure Laterality Date  . Abdominal hysterectomy    . Toe surgery    . Dilation and curettage of uterus    . Breast surgery  unknown (many yrs)    excision  of lump right breast  was per pt "nothing"   . Cardiac catheterization    . Colonoscopy  04/16/2007    RMR: Normal rectum and colon  . Colonoscopy with esophagogastroduodenoscopy (egd) N/A 01/08/2013    ZOX:WRUE erosive reflux esophagitis. Negative H.pylori. Small hiatal hernia-s/p bx-TCS/Colonic polyp-hyperplastic    Current Outpatient Prescriptions  Medication Sig Dispense Refill  . ALPRAZolam (XANAX) 0.25 MG tablet Take 0.25 mg by mouth at bedtime as needed.       Marland Kitchen dexlansoprazole (DEXILANT) 60 MG capsule Take 60 mg by mouth daily.      Marland Kitchen HYDROcodone-acetaminophen (NORCO/VICODIN) 5-325 MG per tablet Take 1 tablet by mouth every 6 (six) hours as needed.       Marland Kitchen olmesartan (BENICAR) 40 MG tablet Take 40 mg by mouth daily.      . ondansetron (ZOFRAN) 8 MG tablet Take 8 mg by mouth every 8 (eight) hours as needed.       . potassium chloride (K-DUR,KLOR-CON) 10 MEQ tablet TAKE 1 TABLET (10 MEQ TOTAL)  BY MOUTH DAILY.  30 tablet  0  . amoxicillin-clarithromycin-lansoprazole (PREVPAC) combo pack Take by mouth 2 (two) times daily. Follow  package directions.  1 kit  0  . CARAFATE 1 GM/10ML suspension Take 500 mg by mouth 2 (two) times daily.       . ciprofloxacin (CIPRO) 250 MG tablet Take 1 tablet (250 mg total) by mouth 2 (two) times daily.  6 tablet  0  . gabapentin (NEURONTIN) 100 MG capsule Take 300 mg by mouth 3 (three) times daily.       Marland Kitchen HYDROcodone-acetaminophen (LORTAB) 7.5-500 MG per tablet Take 1 tablet by mouth every 6 (six) hours as needed. For pain      . Linaclotide (LINZESS) 290 MCG CAPS Take 1 capsule by mouth daily. 30 minutes before breakfast  30 capsule  11  . megestrol (MEGACE) 40 MG/ML suspension Take  800 mg by mouth daily. 800mg =4 teaspoons      . olmesartan-hydrochlorothiazide (BENICAR HCT) 40-25 MG per tablet Take 1 tablet by mouth daily.        Marland Kitchen omeprazole (PRILOSEC) 40 MG capsule Take 40 mg by mouth daily.       . polyethylene glycol-electrolytes (TRILYTE) 420 G solution Take 4,000 mLs by mouth as directed.  4000 mL  0  . polyethylene glycol-electrolytes (TRILYTE) 420 G solution Take 4,000 mLs by mouth as directed.  4000 mL  0  . pravastatin (PRAVACHOL) 20 MG tablet Take 20 mg by mouth daily.      . [DISCONTINUED] potassium chloride (K-DUR) 10 MEQ tablet Take 1 tablet (10 mEq total) by mouth daily.  30 tablet  6   No current facility-administered medications for this visit.   Facility-Administered Medications Ordered in Other Visits  Medication Dose Route Frequency Provider Last Rate Last Dose  . lactated ringers infusion    Continuous PRN Turner Daniels, CRNA        Allergies as of 03/30/2013  . (No Known Allergies)    Family History  Problem Relation Age of Onset  . Colon cancer Neg Hx     History   Social History  . Marital Status: Widowed    Spouse Name: N/A    Number of Children: N/A  . Years of Education: N/A   Social History Main Topics  . Smoking status: Never Smoker   . Smokeless tobacco: None  . Alcohol Use: No  . Drug Use: No  . Sexual Activity: Yes    Birth Control/ Protection: Surgical   Other Topics Concern  . None   Social History Narrative  . None    Review of Systems: Negative unless mentioned in HPI.   Physical Exam: BP 132/70  Pulse 102  Temp(Src) 97.9 F (36.6 C) (Oral)  Ht 5\' 4"  (1.626 m)  Wt 164 lb 12.8 oz (74.753 kg)  BMI 28.27 kg/m2 General:   Alert and oriented. Appears frustrated from discomfort.  Head:  Normocephalic and atraumatic. Eyes:  Conjuctiva clear without scleral icterus. Heart:  S1, S2 present without murmurs, rubs, or gallops. Regular rate and rhythm. Abdomen:  +BS, soft, mild TTP lower abdomen and  non-distended. No rebound or guarding. No HSM or masses noted. Msk:  Symmetrical without gross deformities. Stiff upon standing, slight kyphosis Extremities:  Without edema. Neurologic:  Alert and  oriented x4;  grossly normal neurologically. Skin:  Intact without significant lesions or rashes. Psych:  Alert and cooperative. Normal mood and affect.  Lab Results  Component Value Date   ALT 19 10/06/2012   AST 15 10/06/2012   ALKPHOS 55.0 10/06/2012   BILITOT 0.4 10/06/2012   Lab Results  Component Value  Date   WBC 6.3 06/30/2012   HGB 12.8 10/06/2012   HCT 39 10/06/2012   MCV 91.8 10/06/2012   PLT 434* 06/30/2012    MRI lumbar spine Oct 2013:  1. New large broad-based soft disc protrusion at L4-5 with an  extruded fragment extending superiorly behind the body of L4 on the  right compressing the right L4 nerve.  2. New broad-based disc protrusions at L1-2, L2-3, and L3-4  without focal neural impingement.  [place] of abdomen Sept 2014: normal CT Sept 2014: no acute findings other than as mentioned, see results in epic

## 2013-03-30 NOTE — Patient Instructions (Addendum)
I would like to get further imaging of your spine.   I will be talking with Dr. Jena Gauss about the next step and get back with you as soon as possible.

## 2013-03-31 ENCOUNTER — Ambulatory Visit (HOSPITAL_COMMUNITY)
Admission: RE | Admit: 2013-03-31 | Discharge: 2013-03-31 | Disposition: A | Discharge status: Home / Self Care | Payer: Medicare Other | Source: Ambulatory Visit | Attending: Gastroenterology | Admitting: Gastroenterology

## 2013-03-31 DIAGNOSIS — M47817 Spondylosis without myelopathy or radiculopathy, lumbosacral region: Secondary | ICD-10-CM | POA: Diagnosis not present

## 2013-03-31 DIAGNOSIS — M48061 Spinal stenosis, lumbar region without neurogenic claudication: Secondary | ICD-10-CM | POA: Diagnosis not present

## 2013-03-31 DIAGNOSIS — M5126 Other intervertebral disc displacement, lumbar region: Secondary | ICD-10-CM | POA: Diagnosis not present

## 2013-03-31 DIAGNOSIS — M5137 Other intervertebral disc degeneration, lumbosacral region: Secondary | ICD-10-CM | POA: Insufficient documentation

## 2013-03-31 DIAGNOSIS — M51379 Other intervertebral disc degeneration, lumbosacral region without mention of lumbar back pain or lower extremity pain: Secondary | ICD-10-CM | POA: Insufficient documentation

## 2013-03-31 NOTE — Assessment & Plan Note (Signed)
72 year old pleasant female with persistent abdominal pain that is described as radiating from both her lower abdomen to left flank and from her lower back to lower abdomen. Overall, her reports are vague without relieving factors. Pain seems to worsen with eating, but thus far several CTs have been completed with patent celiac, SMA, and IMA without concern for mesenteric ischemia. TCS/EGD on file, [place] of abdomen normal. I had originally questioned underlying constipation compounding symptoms, but she does not seem to note any improvement after defecation. Question of adhesive disease, musculoskeletal component as well. I am concerned about her prior history of L4 nerve compression and MRI findings from Oct 2013; as she had been scheduled for L4-5 lumbar laminotomy and microdiscectomy with Dr. Newell Coral in Jan 2014, I feel an updated MRI should be obtained due to her current presentation. Question of radicular pain.  Obtain MRI as soon as possible. Refer back to Dr. Newell Coral for recommendations Discuss with Dr. Jena Gauss: laparoscopy vs pain management

## 2013-04-01 ENCOUNTER — Encounter (HOSPITAL_COMMUNITY): Payer: Self-pay

## 2013-04-01 ENCOUNTER — Emergency Department (HOSPITAL_COMMUNITY): Payer: Medicare Other

## 2013-04-01 ENCOUNTER — Telehealth: Payer: Self-pay

## 2013-04-01 ENCOUNTER — Emergency Department (HOSPITAL_COMMUNITY)
Admission: EM | Admit: 2013-04-01 | Discharge: 2013-04-01 | Disposition: A | Discharge status: Home / Self Care | Payer: Medicare Other | Attending: Emergency Medicine | Admitting: Emergency Medicine

## 2013-04-01 DIAGNOSIS — Z90710 Acquired absence of both cervix and uterus: Secondary | ICD-10-CM | POA: Diagnosis not present

## 2013-04-01 DIAGNOSIS — R1013 Epigastric pain: Secondary | ICD-10-CM | POA: Diagnosis not present

## 2013-04-01 DIAGNOSIS — F411 Generalized anxiety disorder: Secondary | ICD-10-CM | POA: Diagnosis not present

## 2013-04-01 DIAGNOSIS — R112 Nausea with vomiting, unspecified: Secondary | ICD-10-CM | POA: Insufficient documentation

## 2013-04-01 DIAGNOSIS — M479 Spondylosis, unspecified: Secondary | ICD-10-CM | POA: Diagnosis not present

## 2013-04-01 DIAGNOSIS — R109 Unspecified abdominal pain: Secondary | ICD-10-CM | POA: Diagnosis not present

## 2013-04-01 DIAGNOSIS — E860 Dehydration: Secondary | ICD-10-CM | POA: Diagnosis not present

## 2013-04-01 DIAGNOSIS — I10 Essential (primary) hypertension: Secondary | ICD-10-CM | POA: Insufficient documentation

## 2013-04-01 DIAGNOSIS — E7800 Pure hypercholesterolemia, unspecified: Secondary | ICD-10-CM | POA: Diagnosis not present

## 2013-04-01 DIAGNOSIS — Z79899 Other long term (current) drug therapy: Secondary | ICD-10-CM | POA: Diagnosis not present

## 2013-04-01 DIAGNOSIS — E119 Type 2 diabetes mellitus without complications: Secondary | ICD-10-CM | POA: Insufficient documentation

## 2013-04-01 DIAGNOSIS — Z792 Long term (current) use of antibiotics: Secondary | ICD-10-CM | POA: Diagnosis not present

## 2013-04-01 DIAGNOSIS — Z9861 Coronary angioplasty status: Secondary | ICD-10-CM | POA: Diagnosis not present

## 2013-04-01 DIAGNOSIS — K5900 Constipation, unspecified: Secondary | ICD-10-CM | POA: Diagnosis not present

## 2013-04-01 DIAGNOSIS — R197 Diarrhea, unspecified: Secondary | ICD-10-CM | POA: Insufficient documentation

## 2013-04-01 DIAGNOSIS — G8929 Other chronic pain: Secondary | ICD-10-CM | POA: Diagnosis not present

## 2013-04-01 DIAGNOSIS — Z8701 Personal history of pneumonia (recurrent): Secondary | ICD-10-CM | POA: Insufficient documentation

## 2013-04-01 LAB — URINALYSIS, ROUTINE W REFLEX MICROSCOPIC
Glucose, UA: NEGATIVE mg/dL
Ketones, ur: 15 mg/dL — AB
Leukocytes, UA: NEGATIVE
Nitrite: NEGATIVE
Protein, ur: NEGATIVE mg/dL
Specific Gravity, Urine: >1.03 — ABNORMAL HIGH (ref 1.005–1.030)
Urobilinogen, UA: 1 mg/dL (ref 0.0–1.0)
pH: 6 (ref 5.0–8.0)

## 2013-04-01 LAB — CBC WITH DIFFERENTIAL/PLATELET
Basophils Absolute: 0.1 10*3/uL (ref 0.0–0.1)
Basophils Relative: 1 % (ref 0–1)
Eosinophils Absolute: 0.1 10*3/uL (ref 0.0–0.7)
Eosinophils Relative: 1 % (ref 0–5)
HCT: 40.1 % (ref 36.0–46.0)
Hemoglobin: 13.5 g/dL (ref 12.0–15.0)
Lymphocytes Relative: 36 % (ref 12–46)
Lymphs Abs: 1.9 10*3/uL (ref 0.7–4.0)
MCH: 31 pg (ref 26.0–34.0)
MCHC: 33.7 g/dL (ref 30.0–36.0)
MCV: 92 fL (ref 78.0–100.0)
Monocytes Absolute: 0.4 10*3/uL (ref 0.1–1.0)
Monocytes Relative: 8 % (ref 3–12)
Neutro Abs: 2.8 10*3/uL (ref 1.7–7.7)
Neutrophils Relative %: 53 % (ref 43–77)
Platelets: 486 10*3/uL — ABNORMAL HIGH (ref 150–400)
RBC: 4.36 MIL/uL (ref 3.87–5.11)
RDW: 13.2 % (ref 11.5–15.5)
WBC: 5.2 10*3/uL (ref 4.0–10.5)

## 2013-04-01 LAB — URINE MICROSCOPIC-ADD ON

## 2013-04-01 LAB — COMPREHENSIVE METABOLIC PANEL
ALT: 49 U/L — ABNORMAL HIGH (ref 0–35)
AST: 40 U/L — ABNORMAL HIGH (ref 0–37)
Albumin: 4.3 g/dL (ref 3.5–5.2)
Alkaline Phosphatase: 76 U/L (ref 39–117)
BUN: 10 mg/dL (ref 6–23)
CO2: 21 mEq/L (ref 19–32)
Calcium: 10.6 mg/dL — ABNORMAL HIGH (ref 8.4–10.5)
Chloride: 102 mEq/L (ref 96–112)
Creatinine, Ser: 0.94 mg/dL (ref 0.50–1.10)
GFR calc Af Amer: 70 mL/min — ABNORMAL LOW (ref >90)
GFR calc non Af Amer: 60 mL/min — ABNORMAL LOW (ref >90)
Glucose, Bld: 95 mg/dL (ref 70–99)
Potassium: 3.5 mEq/L (ref 3.5–5.1)
Sodium: 138 mEq/L (ref 135–145)
Total Bilirubin: 0.7 mg/dL (ref 0.3–1.2)
Total Protein: 7.8 g/dL (ref 6.0–8.3)

## 2013-04-01 MED ORDER — FENTANYL CITRATE 0.05 MG/ML IJ SOLN
50.0000 ug | Freq: Once | INTRAMUSCULAR | Status: AC
  Administered 2013-04-01: 50 ug via INTRAVENOUS
  Filled 2013-04-01: qty 2

## 2013-04-01 MED ORDER — ONDANSETRON 4 MG PO TBDP: 4.0000 mg | ORAL_TABLET | Freq: Three times a day (TID) | ORAL | Status: DC | PRN

## 2013-04-01 MED ORDER — SODIUM CHLORIDE 0.9 % IV SOLN
1000.0000 mL | INTRAVENOUS | Status: DC
  Administered 2013-04-01: 1000 mL via INTRAVENOUS

## 2013-04-01 MED ORDER — ONDANSETRON HCL 4 MG/2ML IJ SOLN
4.0000 mg | Freq: Once | INTRAMUSCULAR | Status: AC
  Administered 2013-04-01: 4 mg via INTRAVENOUS
  Filled 2013-04-01: qty 2

## 2013-04-01 MED ORDER — SODIUM CHLORIDE 0.9 % IV SOLN
1000.0000 mL | Freq: Once | INTRAVENOUS | Status: AC
  Administered 2013-04-01: 1000 mL via INTRAVENOUS

## 2013-04-01 NOTE — Telephone Encounter (Signed)
Pt would like the results from her x-ray that was done on 03/26/13. Please advise

## 2013-04-01 NOTE — Telephone Encounter (Signed)
Pt is aware. She is going to go to the ED now.  Benedetto Goad, please refer pt.

## 2013-04-01 NOTE — Telephone Encounter (Signed)
Patient called this afternoon wanting to know if the nurse got her messages. She is wanting to be admitted. I told her the nurse is aware of previous message and is waiting to see what RMR advises before calling her back. She asked if she can admit herself, but I told her that her PCP or ED doctor could admit her if they felt the need to do so. Please advise and call patient back.

## 2013-04-01 NOTE — ED Notes (Signed)
Pt drove self to ed today, reports having nausea, vomiting and diarrhea for 1 week, stated she has not eaten for 8 days. Denies fever. Was told by gi that she needed surgery on her back.

## 2013-04-01 NOTE — Progress Notes (Signed)
Quick Note:  MRI reviewed without change from 04/2012.  I have referred patient back to Dr. Newell Coral for further assessment.  Discussing with Dr. Jena Gauss next step from a GI perspective. ______

## 2013-04-01 NOTE — Telephone Encounter (Signed)
Pt called stating she feels terrible, pt states he has been sick for a long time, pt states all she is doing is throwing up. Pt states she wants to be admitted into the hospital so they can find out what's wrong with her, pt states she wants her doctor to admit her. Please advise [phone]

## 2013-04-01 NOTE — Telephone Encounter (Signed)
MRI of lumbar spine was performed 9/30, not 9/25.  It showed similar results to previous MRI last year, noting severe multilevel lumbar spondylosis, pronounced stenosis at L4-L5 with unchanged disc extrusion.   She needs an appt to see Dr. Newell Coral as soon as possible; she had actually been scheduled for surgery earlier this year, but this was cancelled the day of the procedure (my office note details this further).   I question if her back issues are an underlying issue here; if she is unable to keep down liquids, she needs to go to the emergency room for evaluation.   I was waiting on MRI reports before discussing with Dr. Jena Gauss, and this was just done yesterday. I still need to review all of this with him regarding further work-up for abdominal pain. For now, needs referral back to Dr. Newell Coral and present to the ED if she is unable to tolerate liquids/stay hydrated.

## 2013-04-01 NOTE — Progress Notes (Signed)
CcD to PCP

## 2013-04-01 NOTE — ED Provider Notes (Signed)
CSN: [id number]     Arrival date & time 04/01/13  1442 History  This chart was scribed for Ward Givens, MD by Dorothey Baseman, ED Scribe. This patient was seen in room APA03/APA03 and the patient's care was started at 3:52 PM.    Chief Complaint  Patient presents with  . Abdominal Pain  . Nausea  . Emesis  . Diarrhea   The history is provided by the patient. No language interpreter was used.   HPI Comments: Audrey Johnson is a 72 y.o. female who presents to the Emergency Department complaining of constant, aching abdominal pain that began as epigastric nos is in the lower abdomen that is exacerbated with sitting up with associated nausea onset 2 weeks ago. She reports nausea and she makes herself vomit at least once a day after she takes her medications but waits until the pills dissolve.over the past 2 weeks and she reports that she forces herself to vomit after she takes her medications. She reports that her appetite has been normal and that she has felt hungry, but that she has not been eating due to the nausea. She reports associated constipation, but that her last BM was one week ago and before that was having watery diarrhea the week before. She states she has the urge to have a BM but nothing happens. Her BM's last week were hard and balls. She denies fever, but feels hot and cold when she gets nauseated. She states she feels weak and dizzy. Patient states that she has had constipation in the past following a colonoscopy. She reports subjective fever with associated chills and diaphoresis.She has been evaluated for abdominal pain by GI for the past several months without etiology found.  Patient reports a history of HTN, DM, and chronic back pain that is well-controlled with medications. Patient was seen here last week for similar complaints and received a CT of the abdomen. She states her GI doctors want her to see her neurosurgeon for her back and they think that is making her abdomen hurt.    PCP- Dr. Margo Aye.  GI Dr Darrick Penna, has been seeing Gerrit Halls PA  Past Medical History  Diagnosis Date  . Hypertension   . High cholesterol   . Chronic back pain   . Anxiety   . Shortness of breath      even walking causes sob...   . Arthritis     in back .   Marland Kitchen Pneumonia   . Borderline diabetes    Past Surgical History  Procedure Laterality Date  . Abdominal hysterectomy    . Toe surgery    . Dilation and curettage of uterus    . Breast surgery  unknown (many yrs)    excision  of lump right breast  was per pt "nothing"   . Cardiac catheterization    . Colonoscopy  04/16/2007    RMR: Normal rectum and colon  . Colonoscopy with esophagogastroduodenoscopy (egd) N/A 01/08/2013    WUJ:WJXB erosive reflux esophagitis. Negative H.pylori. Small hiatal hernia-s/p bx-TCS/Colonic polyp-hyperplastic   Family History  Problem Relation Age of Onset  . Colon cancer Neg Hx    History  Substance Use Topics  . Smoking status: Never Smoker   . Smokeless tobacco: Not on file  . Alcohol Use: No   Live at home  Lives with granddaughter  OB History   Grav Para Term Preterm Abortions TAB SAB Ect Mult Living  Review of Systems  A complete 10 system review of systems was obtained and all systems are negative except as noted in the HPI and PMH.   Allergies  Review of patient's allergies indicates no known allergies.  Home Medications   Current Outpatient Rx  Name  Route  Sig  Dispense  Refill  . ALPRAZolam (XANAX) 0.25 MG tablet   Oral   Take 0.25 mg by mouth at bedtime as needed.          Marland Kitchen amoxicillin-clarithromycin-lansoprazole (PREVPAC) combo pack   Oral   Take by mouth 2 (two) times daily. Follow package directions.   1 kit   0   . CARAFATE 1 GM/10ML suspension   Oral   Take 500 mg by mouth 2 (two) times daily.          . ciprofloxacin (CIPRO) 250 MG tablet   Oral   Take 1 tablet (250 mg total) by mouth 2 (two) times daily.   6 tablet   0   .  dexlansoprazole (DEXILANT) 60 MG capsule   Oral   Take 60 mg by mouth daily.         Marland Kitchen gabapentin (NEURONTIN) 100 MG capsule   Oral   Take 300 mg by mouth 3 (three) times daily.          Marland Kitchen HYDROcodone-acetaminophen (LORTAB) 7.5-500 MG per tablet   Oral   Take 1 tablet by mouth every 6 (six) hours as needed. For pain         . HYDROcodone-acetaminophen (NORCO/VICODIN) 5-325 MG per tablet   Oral   Take 1 tablet by mouth every 6 (six) hours as needed.          Marland Kitchen Linaclotide (LINZESS) 290 MCG CAPS   Oral   Take 1 capsule by mouth daily. 30 minutes before breakfast   30 capsule   11   . megestrol (MEGACE) 40 MG/ML suspension   Oral   Take 800 mg by mouth daily. 800mg =4 teaspoons         . olmesartan (BENICAR) 40 MG tablet   Oral   Take 40 mg by mouth daily.         Marland Kitchen olmesartan-hydrochlorothiazide (BENICAR HCT) 40-25 MG per tablet   Oral   Take 1 tablet by mouth daily.           Marland Kitchen omeprazole (PRILOSEC) 40 MG capsule   Oral   Take 40 mg by mouth daily.          . ondansetron (ZOFRAN) 8 MG tablet   Oral   Take 8 mg by mouth every 8 (eight) hours as needed.          . polyethylene glycol-electrolytes (TRILYTE) 420 G solution   Oral   Take 4,000 mLs by mouth as directed.   4000 mL   0   . polyethylene glycol-electrolytes (TRILYTE) 420 G solution   Oral   Take 4,000 mLs by mouth as directed.   4000 mL   0   . potassium chloride (K-DUR,KLOR-CON) 10 MEQ tablet      TAKE 1 TABLET (10 MEQ TOTAL)  BY MOUTH DAILY.   30 tablet   0     Pt needs to call [phone] for appointment prior to ...   . pravastatin (PRAVACHOL) 20 MG tablet   Oral   Take 20 mg by mouth daily.          Triage Vitals: BP 126/96  Pulse 115  Temp(Src) 98.2  F (36.8 C) (Oral)  Resp 22  Ht 5\' 4"  (1.626 m)  Wt 164 lb (74.39 kg)  BMI 28.14 kg/m2  SpO2 99%  Vital signs normal except tachycardia   Physical Exam  Nursing note and vitals reviewed. Constitutional: She is  oriented to person, place, and time. She appears well-developed and well-nourished.  Non-toxic appearance. She does not appear ill. No distress.  HENT:  Head: Normocephalic and atraumatic.  Nose: No mucosal edema or rhinorrhea.  Mouth/Throat: Oropharynx is clear and moist and mucous membranes are normal. No dental abscesses or edematous.  Tongue is mildly dry.   Eyes: Conjunctivae and EOM are normal. Pupils are equal, round, and reactive to light.  Neck: Normal range of motion and full passive range of motion without pain. Neck supple.  Cardiovascular: Normal rate, regular rhythm and normal heart sounds.  Exam reveals no gallop and no friction rub.   No murmur heard. Pulmonary/Chest: Effort normal and breath sounds normal. No respiratory distress. She has no wheezes. She has no rhonchi. She has no rales. She exhibits no tenderness and no crepitus.  Abdominal: Soft. Normal appearance and bowel sounds are normal. She exhibits no distension. There is tenderness. There is no rebound and no guarding.  Diffuse tenderness to palpation to the suprapubic left abdomen, upper and lower.   Musculoskeletal: Normal range of motion. She exhibits no edema and no tenderness.  Moves all extremities well.   Neurological: She is alert and oriented to person, place, and time. She has normal strength. No cranial nerve deficit.  Skin: Skin is warm, dry and intact. No rash noted. No erythema. No pallor.  Psychiatric: She has a normal mood and affect. Her speech is normal and behavior is normal. Her mood appears not anxious.    ED Course  Procedures (including critical care time)  Medications  0.9 %  sodium chloride infusion (0 mLs Intravenous Stopped 04/01/13 1832)    Followed by  0.9 %  sodium chloride infusion (0 mLs Intravenous Stopped 04/01/13 2027)  ondansetron (ZOFRAN) injection 4 mg (4 mg Intravenous Given 04/01/13 1724)  fentaNYL (SUBLIMAZE) injection 50 mcg (50 mcg Intravenous Given 04/01/13 1724)     DIAGNOSTIC STUDIES: Oxygen Saturation is 99% on room air, normal by my interpretation.    COORDINATION OF CARE: 4:03PM- Patient received an ultrasound of the abdomen on 03/12/2013 that was normal with no gallstones. 03/25/2013 patient received a CT of the abdomen and pelvis that showed no acute inflammatory process in the pelvis or abdomen with no evidence of colitis or diverticulitis. MRI of the lumbar spine on 03/30/2013 that showed multi-level lumbar spondylosis, unchanged from prior exam of October, 2013. Will order a chest x-ray, IV fluids, Zofran, and Sublimaze. Advised patient to follow up with her GI specialist, especially if there are any new or worsening symptoms. Discussed treatment plan with patient at bedside and patient verbalized agreement.   6:49PM- Discussed that blood results were normal. Patient reports that she is feeling slightly better. Will discharge patient with anti-nausea medication. Advised patient to follow up with her PCP and she reports that she has an appointment to follow up with her PCP in November. Discussed treatment plan with patient at bedside and patient verbalized agreement. States her pain was better and her nausea was better.   9:14PM- Discussed that UA does not indicate infection, but that it is slightly concentrated and advised patient to stay hydrated. Patient will be discharged. Discussed treatment plan with patient at bedside and patient verbalized agreement.  Labs Review Results for orders placed during the hospital encounter of 04/01/13  CBC WITH DIFFERENTIAL      Result Value Range   WBC 5.2  4.0 - 10.5 K/uL   RBC 4.36  3.87 - 5.11 MIL/uL   Hemoglobin 13.5  12.0 - 15.0 g/dL   HCT 40.9  81.1 - 91.4 %   MCV 92.0  78.0 - 100.0 fL   MCH 31.0  26.0 - 34.0 pg   MCHC 33.7  30.0 - 36.0 g/dL   RDW 78.2  95.6 - 21.3 %   Platelets 486 (*) 150 - 400 K/uL   Neutrophils Relative % 53  43 - 77 %   Neutro Abs 2.8  1.7 - 7.7 K/uL   Lymphocytes  Relative 36  12 - 46 %   Lymphs Abs 1.9  0.7 - 4.0 K/uL   Monocytes Relative 8  3 - 12 %   Monocytes Absolute 0.4  0.1 - 1.0 K/uL   Eosinophils Relative 1  0 - 5 %   Eosinophils Absolute 0.1  0.0 - 0.7 K/uL   Basophils Relative 1  0 - 1 %   Basophils Absolute 0.1  0.0 - 0.1 K/uL  COMPREHENSIVE METABOLIC PANEL      Result Value Range   Sodium 138  135 - 145 mEq/L   Potassium 3.5  3.5 - 5.1 mEq/L   Chloride 102  96 - 112 mEq/L   CO2 21  19 - 32 mEq/L   Glucose, Bld 95  70 - 99 mg/dL   BUN 10  6 - 23 mg/dL   Creatinine, Ser 0.86  0.50 - 1.10 mg/dL   Calcium 57.8 (*) 8.4 - 10.5 mg/dL   Total Protein 7.8  6.0 - 8.3 g/dL   Albumin 4.3  3.5 - 5.2 g/dL   AST 40 (*) 0 - 37 U/L   ALT 49 (*) 0 - 35 U/L   Alkaline Phosphatase 76  39 - 117 U/L   Total Bilirubin 0.7  0.3 - 1.2 mg/dL   GFR calc non Af Amer 60 (*) >90 mL/min   GFR calc Af Amer 70 (*) >90 mL/min  URINALYSIS, ROUTINE W REFLEX MICROSCOPIC      Result Value Range   Color, Urine AMBER (*) YELLOW   APPearance CLEAR  CLEAR   Specific Gravity, Urine >1.030 (*) 1.005 - 1.030   pH 6.0  5.0 - 8.0   Glucose, UA NEGATIVE  NEGATIVE mg/dL   Hgb urine dipstick TRACE (*) NEGATIVE   Bilirubin Urine SMALL (*) NEGATIVE   Ketones, ur 15 (*) NEGATIVE mg/dL   Protein, ur NEGATIVE  NEGATIVE mg/dL   Urobilinogen, UA 1.0  0.0 - 1.0 mg/dL   Nitrite NEGATIVE  NEGATIVE   Leukocytes, UA NEGATIVE  NEGATIVE  URINE MICROSCOPIC-ADD ON      Result Value Range   Squamous Epithelial / LPF FEW (*) RARE   WBC, UA 0-2  <3 WBC/hpf   RBC / HPF 0-2  <3 RBC/hpf   Bacteria, UA RARE  RARE   Urine-Other MUCOUS PRESENT      Laboratory interpretation all normal except concentrated urine c/w dehydration   Imaging Review    Dg Abd Acute W/chest  04/01/2013   CLINICAL DATA:  Abdominal pain, nausea and vomiting.  EXAM: ACUTE ABDOMEN SERIES (ABDOMEN 2 VIEW & CHEST 1 VIEW)  COMPARISON:  Chest x-ray 05/05/2012.  FINDINGS: The upright chest x-ray is normal.  Two  views of the abdomen demonstrate an  unremarkable bowel gas pattern. No findings for obstruction or perforation. The soft tissue shadows are maintained. Stable calcification overlying the right L5 transverse process appears to be located in the ovarian vein on a prior CT scan. The bony structures are intact.  IMPRESSION: Negative abdominal radiographs.  No acute cardiopulmonary disease.   Electronically Signed   By: Loralie Champagne M.D.   On: 2013/04/07 17:07   Mr Lumbar Spine Wo Contrast  03/31/2013  IMPRESSION: Severe multilevel lumbar spondylosis detailed above. No interval change compared to the prior exam of 2012-04-07. Stenosis is most pronounced at L4-L5 with unchanged disc extrusion.   Electronically Signed   By: Andreas Newport M.D.   On: 03/31/2013 10:32   US Abdomen Limited Ruq  03/18/2013 IMPRESSION: Study within normal limits.   Electronically Signed   By: Bretta Bang   On: 03/18/2013 15:07   Ct Abdomen Pelvis W Contrast  03/26/2013  .  IMPRESSION: 1. No acute inflammatory process within abdomen or pelvis. 2. No hydronephrosis or hydroureter. Stable small right renal cysts. 3. No pericecal inflammation. No evidence of colitis or diverticulitis. 4. Status post hysterectomy. 5. Degenerative changes lumbar spine.   Electronically Signed   By: Natasha Mead   On: 03/26/2013 15:28     MDM   1. Constipation   2. Dehydration   3. Abdominal pain    Discharge Medication List as of 04-07-13  9:31 PM    START taking these medications   Details  ondansetron (ZOFRAN ODT) 4 MG disintegrating tablet Take 1 tablet (4 mg total) by mouth every 8 (eight) hours as needed for nausea., Starting 04/07/13, Until Discontinued, Print        Plan discharge   Devoria Albe, MD, FACEP   I personally performed the services described in this documentation, which was scribed in my presence. The recorded information has been reviewed and considered.  Devoria Albe, MD, Armando Gang    Ward Givens,  MD 04/02/13 [phone]

## 2013-04-02 ENCOUNTER — Other Ambulatory Visit: Payer: Self-pay | Admitting: Gastroenterology

## 2013-04-02 DIAGNOSIS — M4716 Other spondylosis with myelopathy, lumbar region: Secondary | ICD-10-CM

## 2013-04-02 NOTE — Telephone Encounter (Signed)
Appears that patient presented to the ED. From reading the notes, she is dealing with significant constipation.  I had started her on Linzess I believe at last appt. Can we see if she has started this? She has chronic constipation, and this needs to be aggressively addressed.

## 2013-04-02 NOTE — Telephone Encounter (Signed)
Referral has been made to Dr. Mauri Pole

## 2013-04-06 ENCOUNTER — Encounter (HOSPITAL_COMMUNITY): Payer: Self-pay | Admitting: Emergency Medicine

## 2013-04-06 ENCOUNTER — Emergency Department (HOSPITAL_COMMUNITY)
Admission: EM | Admit: 2013-04-06 | Discharge: 2013-04-06 | Disposition: A | Discharge status: Home / Self Care | Payer: Medicare Other | Attending: Emergency Medicine | Admitting: Emergency Medicine

## 2013-04-06 DIAGNOSIS — R1032 Left lower quadrant pain: Secondary | ICD-10-CM | POA: Insufficient documentation

## 2013-04-06 DIAGNOSIS — M545 Low back pain, unspecified: Secondary | ICD-10-CM | POA: Insufficient documentation

## 2013-04-06 DIAGNOSIS — Z8639 Personal history of other endocrine, nutritional and metabolic disease: Secondary | ICD-10-CM | POA: Insufficient documentation

## 2013-04-06 DIAGNOSIS — Z8659 Personal history of other mental and behavioral disorders: Secondary | ICD-10-CM | POA: Diagnosis not present

## 2013-04-06 DIAGNOSIS — Z9889 Other specified postprocedural states: Secondary | ICD-10-CM | POA: Diagnosis not present

## 2013-04-06 DIAGNOSIS — R110 Nausea: Secondary | ICD-10-CM

## 2013-04-06 DIAGNOSIS — Z90710 Acquired absence of both cervix and uterus: Secondary | ICD-10-CM | POA: Diagnosis not present

## 2013-04-06 DIAGNOSIS — I10 Essential (primary) hypertension: Secondary | ICD-10-CM | POA: Insufficient documentation

## 2013-04-06 DIAGNOSIS — R1031 Right lower quadrant pain: Secondary | ICD-10-CM | POA: Diagnosis not present

## 2013-04-06 DIAGNOSIS — Z8701 Personal history of pneumonia (recurrent): Secondary | ICD-10-CM | POA: Diagnosis not present

## 2013-04-06 DIAGNOSIS — Z8739 Personal history of other diseases of the musculoskeletal system and connective tissue: Secondary | ICD-10-CM | POA: Insufficient documentation

## 2013-04-06 DIAGNOSIS — Z862 Personal history of diseases of the blood and blood-forming organs and certain disorders involving the immune mechanism: Secondary | ICD-10-CM | POA: Insufficient documentation

## 2013-04-06 DIAGNOSIS — R109 Unspecified abdominal pain: Secondary | ICD-10-CM

## 2013-04-06 DIAGNOSIS — Z79899 Other long term (current) drug therapy: Secondary | ICD-10-CM | POA: Diagnosis not present

## 2013-04-06 DIAGNOSIS — G8929 Other chronic pain: Secondary | ICD-10-CM | POA: Diagnosis not present

## 2013-04-06 LAB — COMPREHENSIVE METABOLIC PANEL
ALT: 47 U/L — ABNORMAL HIGH (ref 0–35)
AST: 39 U/L — ABNORMAL HIGH (ref 0–37)
Albumin: 3.8 g/dL (ref 3.5–5.2)
Alkaline Phosphatase: 74 U/L (ref 39–117)
BUN: 8 mg/dL (ref 6–23)
CO2: 20 mEq/L (ref 19–32)
Calcium: 9.7 mg/dL (ref 8.4–10.5)
Chloride: 105 mEq/L (ref 96–112)
Creatinine, Ser: 0.88 mg/dL (ref 0.50–1.10)
GFR calc Af Amer: 75 mL/min — ABNORMAL LOW (ref >90)
GFR calc non Af Amer: 65 mL/min — ABNORMAL LOW (ref >90)
Glucose, Bld: 92 mg/dL (ref 70–99)
Potassium: 3.6 mEq/L (ref 3.5–5.1)
Sodium: 139 mEq/L (ref 135–145)
Total Bilirubin: 0.5 mg/dL (ref 0.3–1.2)
Total Protein: 7 g/dL (ref 6.0–8.3)

## 2013-04-06 LAB — CBC WITH DIFFERENTIAL/PLATELET
Basophils Absolute: 0.1 10*3/uL (ref 0.0–0.1)
Basophils Relative: 2 % — ABNORMAL HIGH (ref 0–1)
Eosinophils Absolute: 0.1 10*3/uL (ref 0.0–0.7)
Eosinophils Relative: 1 % (ref 0–5)
HCT: 38.4 % (ref 36.0–46.0)
Hemoglobin: 13.1 g/dL (ref 12.0–15.0)
Lymphocytes Relative: 28 % (ref 12–46)
Lymphs Abs: 1.4 10*3/uL (ref 0.7–4.0)
MCH: 30.8 pg (ref 26.0–34.0)
MCHC: 34.1 g/dL (ref 30.0–36.0)
MCV: 90.4 fL (ref 78.0–100.0)
Monocytes Absolute: 0.6 10*3/uL (ref 0.1–1.0)
Monocytes Relative: 12 % (ref 3–12)
Neutro Abs: 2.9 10*3/uL (ref 1.7–7.7)
Neutrophils Relative %: 57 % (ref 43–77)
Platelets: 445 10*3/uL — ABNORMAL HIGH (ref 150–400)
RBC: 4.25 MIL/uL (ref 3.87–5.11)
RDW: 13.4 % (ref 11.5–15.5)
WBC: 5.1 10*3/uL (ref 4.0–10.5)

## 2013-04-06 LAB — URINALYSIS, ROUTINE W REFLEX MICROSCOPIC
Glucose, UA: NEGATIVE mg/dL
Ketones, ur: 15 mg/dL — AB
Nitrite: NEGATIVE
Protein, ur: 30 mg/dL — AB
Specific Gravity, Urine: 1.042 — ABNORMAL HIGH (ref 1.005–1.030)
Urobilinogen, UA: 1 mg/dL (ref 0.0–1.0)
pH: 6 (ref 5.0–8.0)

## 2013-04-06 LAB — URINE MICROSCOPIC-ADD ON

## 2013-04-06 LAB — LIPASE, BLOOD: Lipase: 17 U/L (ref 11–59)

## 2013-04-06 MED ORDER — ONDANSETRON 8 MG PO TBDP: 8.0000 mg | ORAL_TABLET | Freq: Three times a day (TID) | ORAL | Status: DC | PRN

## 2013-04-06 MED ORDER — SODIUM CHLORIDE 0.9 % IV SOLN
INTRAVENOUS | Status: DC
  Administered 2013-04-06: 14:00:00 via INTRAVENOUS

## 2013-04-06 MED ORDER — FENTANYL CITRATE 0.05 MG/ML IJ SOLN
50.0000 ug | Freq: Once | INTRAMUSCULAR | Status: AC
  Administered 2013-04-06: 50 ug via INTRAVENOUS
  Filled 2013-04-06: qty 2

## 2013-04-06 MED ORDER — HYDROMORPHONE HCL PF 1 MG/ML IJ SOLN
1.0000 mg | Freq: Once | INTRAMUSCULAR | Status: AC
  Administered 2013-04-06: 1 mg via INTRAVENOUS
  Filled 2013-04-06: qty 1

## 2013-04-06 MED ORDER — ONDANSETRON 4 MG PO TBDP
8.0000 mg | ORAL_TABLET | Freq: Once | ORAL | Status: AC
  Administered 2013-04-06: 8 mg via ORAL
  Filled 2013-04-06: qty 2

## 2013-04-06 NOTE — ED Notes (Signed)
Pt c/o lower abd pain with nausea and back pain x 3-4 weeks; pt sts poor PO intake

## 2013-04-06 NOTE — Telephone Encounter (Signed)
Pt is taking linzess and having daily bm's but she stated she wasn't feeling any better. She stated her pain was in her back, side and stomach, her entire abd, she couldn't pinpoint any one spot. No fever, some nausea and vomiting. She has not heard from her back surgeon yet (referral has been sent) pt stated she was tired of hurting all the time and was going to [facility] hospital ED.

## 2013-04-06 NOTE — Telephone Encounter (Signed)
Continue Linzess.  Discussed with Dr. Jena Gauss, and we will continue with recommendations for referral to Dr. Newell Coral. Let's see if we can get this expedited?   Pain Management referral in future likely.

## 2013-04-06 NOTE — ED Provider Notes (Signed)
Medical screening examination/treatment/procedure(s) were conducted as a shared visit with non-physician practitioner(s) and myself.  I personally evaluated the patient during the encounter  Patient with chronic pain x1 month and today her symptoms are no different. Called her doctor who told her to come in here. Will check labs and give patient analgesics and likely discharged home. She does not have any signs of a surgical abdomen at this time  Toy Baker, MD 04/06/13 1311

## 2013-04-06 NOTE — Progress Notes (Signed)
Quick Note:  Discussed with Dr. Jena Gauss.  Needs to follow-up with Dr. Newell Coral. Sounds radicular in origin, which is what I was questioning. Consider Pain Management after she sees Dr. Newell Coral. ______

## 2013-04-06 NOTE — ED Provider Notes (Signed)
CSN: [id number]     Arrival date & time 04/06/13  7829 History   First MD Initiated Contact with Patient 04/06/13 1107     Chief Complaint  Patient presents with  . Abdominal Pain  . Back Pain  . Nausea   (Consider location/radiation/quality/duration/timing/severity/associated sxs/prior Treatment) HPI Comments: Patient is a 72 year old female past medical history significant for hypertension, chronic back pain, and presented to emergency department for one month of continued lower abdominal pain with radiation to low back with associated nausea and decreased by mouth intake. Patient denies any changes in her abdominal pain her symptoms. Patient rates her pain 8/10 with no alleviating factors, even the medications provided by her GI doctor, Dr. Darrick Penna. Patient denies any aggravating factors. Patient states she called her doctor this morning and was advised to come to the emergency department for further evaluation. She states she was seen one week ago in the emergency department with a negative MR of her back. She states using 2 weeks ago around [facility] emergency department with a negative CT scan of her abdomen. Patient states she was advised to followup with the neurosurgeon and told that her abdominal pain may be related to chronic lower back pain. Denies fevers, vomiting, diarrhea, urinary symptoms, CP, SOB. Surgical history includes abdominal hysterectomy.  Patient is a 72 y.o. female presenting with abdominal pain and back pain.  Abdominal Pain Associated symptoms: nausea   Associated symptoms: no chest pain, no diarrhea, no dysuria, no fever, no shortness of breath and no vomiting   Back Pain Associated symptoms: abdominal pain   Associated symptoms: no chest pain, no dysuria, no fever and no headaches     Past Medical History  Diagnosis Date  . Hypertension   . High cholesterol   . Chronic back pain   . Anxiety   . Shortness of breath      even walking causes sob...   . Arthritis      in back .   Marland Kitchen Pneumonia   . Borderline diabetes    Past Surgical History  Procedure Laterality Date  . Abdominal hysterectomy    . Toe surgery    . Dilation and curettage of uterus    . Breast surgery  unknown (many yrs)    excision  of lump right breast  was per pt "nothing"   . Cardiac catheterization    . Colonoscopy  04/16/2007    RMR: Normal rectum and colon  . Colonoscopy with esophagogastroduodenoscopy (egd) N/A 01/08/2013    FAO:ZHYQ erosive reflux esophagitis. Negative H.pylori. Small hiatal hernia-s/p bx-TCS/Colonic polyp-hyperplastic   Family History  Problem Relation Age of Onset  . Colon cancer Neg Hx    History  Substance Use Topics  . Smoking status: Never Smoker   . Smokeless tobacco: Not on file  . Alcohol Use: No   OB History   Grav Para Term Preterm Abortions TAB SAB Ect Mult Living                 Review of Systems  Constitutional: Negative for fever.  HENT: Negative.   Eyes: Negative.   Respiratory: Negative for shortness of breath.   Cardiovascular: Negative for chest pain.  Gastrointestinal: Positive for nausea and abdominal pain. Negative for vomiting and diarrhea.  Genitourinary: Negative for dysuria, urgency and decreased urine volume.  Musculoskeletal: Positive for back pain.  Skin: Negative.   Neurological: Negative for syncope and headaches.    Allergies  Review of patient's allergies indicates no known allergies.  Home Medications   Current Outpatient Rx  Name  Route  Sig  Dispense  Refill  . HYDROcodone-acetaminophen (NORCO/VICODIN) 5-325 MG per tablet   Oral   Take 1 tablet by mouth every 6 (six) hours as needed.          Marland Kitchen olmesartan (BENICAR) 40 MG tablet   Oral   Take 40 mg by mouth daily.         . ondansetron (ZOFRAN ODT) 4 MG disintegrating tablet   Oral   Take 1 tablet (4 mg total) by mouth every 8 (eight) hours as needed for nausea.   20 tablet   0   . polyethylene glycol (MIRALAX / GLYCOLAX) packet    Oral   Take 17 g by mouth daily.         . potassium chloride (K-DUR) 10 MEQ tablet   Oral   Take 10 mEq by mouth daily.         . ondansetron (ZOFRAN-ODT) 8 MG disintegrating tablet   Oral   Take 1 tablet (8 mg total) by mouth every 8 (eight) hours as needed for nausea.   20 tablet   0    BP 135/52  Pulse 94  Temp(Src) 99 F (37.2 C) (Oral)  Resp 16  Ht 5\' 4"  (1.626 m)  Wt 162 lb 8 oz (73.71 kg)  BMI 27.88 kg/m2  SpO2 98% Physical Exam  Constitutional: She is oriented to person, place, and time. She appears well-developed and well-nourished.  HENT:  Head: Normocephalic and atraumatic.  Right Ear: External ear normal.  Left Ear: External ear normal.  Nose: Nose normal.  Eyes: Conjunctivae are normal.  Neck: Normal range of motion. Neck supple.  Cardiovascular: Normal rate, regular rhythm, normal heart sounds and intact distal pulses.   Pulmonary/Chest: Effort normal and breath sounds normal. No respiratory distress. She exhibits no tenderness.  Abdominal: Soft. Bowel sounds are normal. She exhibits no distension and no mass. There is no hepatosplenomegaly. There is tenderness in the right lower quadrant, suprapubic area and left lower quadrant. There is no rigidity, no rebound, no guarding and no CVA tenderness.  Musculoskeletal: She exhibits no edema.  Lymphadenopathy:    She has no cervical adenopathy.  Neurological: She is alert and oriented to person, place, and time.  Skin: Skin is warm and dry. She is not diaphoretic.    ED Course  Procedures (including critical care time) Medications  ondansetron (ZOFRAN-ODT) disintegrating tablet 8 mg (8 mg Oral Given 04/06/13 1332)  fentaNYL (SUBLIMAZE) injection 50 mcg (50 mcg Intravenous Given 04/06/13 1332)  HYDROmorphone (DILAUDID) injection 1 mg (1 mg Intravenous Given 04/06/13 1414)    Labs Review Labs Reviewed  CBC WITH DIFFERENTIAL - Abnormal; Notable for the following:    Platelets 445 (*)    Basophils Relative  2 (*)    All other components within normal limits  COMPREHENSIVE METABOLIC PANEL - Abnormal; Notable for the following:    AST 39 (*)    ALT 47 (*)    GFR calc non Af Amer 65 (*)    GFR calc Af Amer 75 (*)    All other components within normal limits  URINALYSIS, ROUTINE W REFLEX MICROSCOPIC - Abnormal; Notable for the following:    Color, Urine AMBER (*)    APPearance CLOUDY (*)    Specific Gravity, Urine 1.042 (*)    Hgb urine dipstick TRACE (*)    Bilirubin Urine SMALL (*)    Ketones, ur 15 (*)  Protein, ur 30 (*)    Leukocytes, UA TRACE (*)    All other components within normal limits  URINE MICROSCOPIC-ADD ON - Abnormal; Notable for the following:    Squamous Epithelial / LPF MANY (*)    Bacteria, UA FEW (*)    All other components within normal limits  LIPASE, BLOOD   Imaging Review No results found.  MDM   1. Abdominal pain   2. Nausea    Afebrile, NAD, non-toxic appearing, AAOx4. Patient with chronic abdominal pain without change in symptoms x1 month with previous ED visits with negative workup, including negative MRI of lumbar spine last week. Abdomen soft minimally tender without rebound, guarding, rigidity, or other peritoneal signs. Patient has been advised to followup with neurosurgeon to discuss lumbar pain per her GI doctor, daily his abdominal pain may be related to low back pain, patient has not had her appointment yet. I have personally reviewed all laboratory findings, vitals, and nursing notes. Pain and other symptoms managed in ED. On repeat abdominal exam abdomen nonsurgical. Advised PCP, GI, and neurosurgeon follow up. Return precautions dicussed. Patient is agreeable to plan. Patient is stable at time of discharge. Patient d/w with Dr. Freida Busman, agrees with plan.         Jeannetta Ellis, PA-C 04/06/13 1931

## 2013-04-06 NOTE — Telephone Encounter (Signed)
I spoke with Lawanna Kobus, referral coordinator at [phone] ext. [phone].  She stated she is behind on their referral, however if i could send that referral to fax number [phone], she would make sure the patient receives an urgent appt.

## 2013-04-06 NOTE — Telephone Encounter (Signed)
Routing to [employer]

## 2013-04-06 NOTE — ED Notes (Signed)
Pt d/c'd from IV, continuous pulse oximetry and blood pressure cuff; pt being discharged home, pt getting dressed at this time

## 2013-04-07 NOTE — ED Provider Notes (Signed)
Medical screening examination/treatment/procedure(s) were conducted as a shared visit with non-physician practitioner(s) and myself.  I personally evaluated the patient during the encounter  Toy Baker, MD 04/07/13 [phone]

## 2013-04-07 NOTE — Telephone Encounter (Signed)
Pt has an appt on 04/10/13 and pt was made aware of appt by Dr. Antony Contras office

## 2013-04-11 ENCOUNTER — Emergency Department (HOSPITAL_COMMUNITY)
Admission: EM | Admit: 2013-04-11 | Discharge: 2013-04-11 | Disposition: A | Discharge status: Home / Self Care | Payer: Medicare Other | Attending: Emergency Medicine | Admitting: Emergency Medicine

## 2013-04-11 ENCOUNTER — Emergency Department (HOSPITAL_COMMUNITY): Payer: Medicare Other

## 2013-04-11 ENCOUNTER — Encounter (HOSPITAL_COMMUNITY): Payer: Self-pay | Admitting: Emergency Medicine

## 2013-04-11 DIAGNOSIS — Z8701 Personal history of pneumonia (recurrent): Secondary | ICD-10-CM | POA: Diagnosis not present

## 2013-04-11 DIAGNOSIS — I10 Essential (primary) hypertension: Secondary | ICD-10-CM | POA: Diagnosis not present

## 2013-04-11 DIAGNOSIS — Z79899 Other long term (current) drug therapy: Secondary | ICD-10-CM | POA: Insufficient documentation

## 2013-04-11 DIAGNOSIS — F411 Generalized anxiety disorder: Secondary | ICD-10-CM | POA: Insufficient documentation

## 2013-04-11 DIAGNOSIS — Z862 Personal history of diseases of the blood and blood-forming organs and certain disorders involving the immune mechanism: Secondary | ICD-10-CM | POA: Insufficient documentation

## 2013-04-11 DIAGNOSIS — M129 Arthropathy, unspecified: Secondary | ICD-10-CM | POA: Insufficient documentation

## 2013-04-11 DIAGNOSIS — G8929 Other chronic pain: Secondary | ICD-10-CM | POA: Diagnosis not present

## 2013-04-11 DIAGNOSIS — Z90710 Acquired absence of both cervix and uterus: Secondary | ICD-10-CM | POA: Diagnosis not present

## 2013-04-11 DIAGNOSIS — Z8639 Personal history of other endocrine, nutritional and metabolic disease: Secondary | ICD-10-CM | POA: Insufficient documentation

## 2013-04-11 DIAGNOSIS — Z8659 Personal history of other mental and behavioral disorders: Secondary | ICD-10-CM | POA: Diagnosis not present

## 2013-04-11 DIAGNOSIS — M545 Low back pain, unspecified: Secondary | ICD-10-CM | POA: Insufficient documentation

## 2013-04-11 DIAGNOSIS — R5381 Other malaise: Secondary | ICD-10-CM | POA: Insufficient documentation

## 2013-04-11 DIAGNOSIS — Z9889 Other specified postprocedural states: Secondary | ICD-10-CM | POA: Insufficient documentation

## 2013-04-11 DIAGNOSIS — R630 Anorexia: Secondary | ICD-10-CM | POA: Diagnosis not present

## 2013-04-11 DIAGNOSIS — N281 Cyst of kidney, acquired: Secondary | ICD-10-CM | POA: Diagnosis not present

## 2013-04-11 DIAGNOSIS — K5900 Constipation, unspecified: Secondary | ICD-10-CM | POA: Insufficient documentation

## 2013-04-11 DIAGNOSIS — R109 Unspecified abdominal pain: Secondary | ICD-10-CM

## 2013-04-11 DIAGNOSIS — R1084 Generalized abdominal pain: Secondary | ICD-10-CM | POA: Diagnosis not present

## 2013-04-11 LAB — COMPREHENSIVE METABOLIC PANEL
ALT: 48 U/L — ABNORMAL HIGH (ref 0–35)
AST: 35 U/L (ref 0–37)
Albumin: 3.5 g/dL (ref 3.5–5.2)
Alkaline Phosphatase: 74 U/L (ref 39–117)
BUN: 10 mg/dL (ref 6–23)
CO2: 18 mEq/L — ABNORMAL LOW (ref 19–32)
Calcium: 10 mg/dL (ref 8.4–10.5)
Chloride: 104 mEq/L (ref 96–112)
Creatinine, Ser: 0.86 mg/dL (ref 0.50–1.10)
GFR calc Af Amer: 78 mL/min — ABNORMAL LOW (ref >90)
GFR calc non Af Amer: 67 mL/min — ABNORMAL LOW (ref >90)
Glucose, Bld: 120 mg/dL — ABNORMAL HIGH (ref 70–99)
Potassium: 3.3 mEq/L — ABNORMAL LOW (ref 3.5–5.1)
Sodium: 137 mEq/L (ref 135–145)
Total Bilirubin: 0.6 mg/dL (ref 0.3–1.2)
Total Protein: 6.5 g/dL (ref 6.0–8.3)

## 2013-04-11 LAB — URINALYSIS, ROUTINE W REFLEX MICROSCOPIC
Bilirubin Urine: NEGATIVE
Glucose, UA: NEGATIVE mg/dL
Ketones, ur: NEGATIVE mg/dL
Leukocytes, UA: NEGATIVE
Nitrite: NEGATIVE
Protein, ur: NEGATIVE mg/dL
Specific Gravity, Urine: 1.025 (ref 1.005–1.030)
Urobilinogen, UA: 1 mg/dL (ref 0.0–1.0)
pH: 7 (ref 5.0–8.0)

## 2013-04-11 LAB — CBC WITH DIFFERENTIAL/PLATELET
Basophils Absolute: 0.1 10*3/uL (ref 0.0–0.1)
Basophils Relative: 1 % (ref 0–1)
Eosinophils Absolute: 0 10*3/uL (ref 0.0–0.7)
Eosinophils Relative: 1 % (ref 0–5)
HCT: 38.2 % (ref 36.0–46.0)
Hemoglobin: 12.9 g/dL (ref 12.0–15.0)
Lymphocytes Relative: 36 % (ref 12–46)
Lymphs Abs: 2.3 10*3/uL (ref 0.7–4.0)
MCH: 30.8 pg (ref 26.0–34.0)
MCHC: 33.8 g/dL (ref 30.0–36.0)
MCV: 91.2 fL (ref 78.0–100.0)
Monocytes Absolute: 0.5 10*3/uL (ref 0.1–1.0)
Monocytes Relative: 8 % (ref 3–12)
Neutro Abs: 3.5 10*3/uL (ref 1.7–7.7)
Neutrophils Relative %: 55 % (ref 43–77)
Platelets: 413 10*3/uL — ABNORMAL HIGH (ref 150–400)
RBC: 4.19 MIL/uL (ref 3.87–5.11)
RDW: 13.1 % (ref 11.5–15.5)
WBC: 6.4 10*3/uL (ref 4.0–10.5)

## 2013-04-11 LAB — BASIC METABOLIC PANEL
BUN: 8 mg/dL (ref 6–23)
CO2: 19 mEq/L (ref 19–32)
Calcium: 9.3 mg/dL (ref 8.4–10.5)
Chloride: 102 mEq/L (ref 96–112)
Creatinine, Ser: 0.82 mg/dL (ref 0.50–1.10)
GFR calc Af Amer: 82 mL/min — ABNORMAL LOW (ref >90)
GFR calc non Af Amer: 71 mL/min — ABNORMAL LOW (ref >90)
Glucose, Bld: 97 mg/dL (ref 70–99)
Potassium: 3.3 mEq/L — ABNORMAL LOW (ref 3.5–5.1)
Sodium: 135 mEq/L (ref 135–145)

## 2013-04-11 LAB — URINE MICROSCOPIC-ADD ON

## 2013-04-11 LAB — TROPONIN I: Troponin I: <0.3 ng/mL (ref <0.30)

## 2013-04-11 LAB — LACTIC ACID, PLASMA
Lactic Acid, Venous: 2.7 mmol/L — ABNORMAL HIGH (ref 0.5–2.2)
Lactic Acid, Venous: 2.8 mmol/L — ABNORMAL HIGH (ref 0.5–2.2)
Lactic Acid, Venous: 2.1 mmol/L (ref 0.5–2.2)

## 2013-04-11 LAB — LIPASE, BLOOD: Lipase: 20 U/L (ref 11–59)

## 2013-04-11 MED ORDER — SODIUM CHLORIDE 0.9 % IV BOLUS (SEPSIS)
1000.0000 mL | Freq: Once | INTRAVENOUS | Status: AC
  Administered 2013-04-11: 1000 mL via INTRAVENOUS

## 2013-04-11 MED ORDER — HYDROCODONE-ACETAMINOPHEN 5-325 MG PO TABS
2.0000 | ORAL_TABLET | Freq: Once | ORAL | Status: AC
  Administered 2013-04-11: 2 via ORAL
  Filled 2013-04-11: qty 2

## 2013-04-11 MED ORDER — ONDANSETRON 4 MG PO TBDP
4.0000 mg | ORAL_TABLET | Freq: Once | ORAL | Status: AC
  Administered 2013-04-11: 4 mg via ORAL
  Filled 2013-04-11: qty 1

## 2013-04-11 MED ORDER — ONDANSETRON HCL 4 MG/2ML IJ SOLN
4.0000 mg | Freq: Once | INTRAMUSCULAR | Status: AC
  Administered 2013-04-11: 4 mg via INTRAVENOUS
  Filled 2013-04-11: qty 2

## 2013-04-11 MED ORDER — MORPHINE SULFATE 4 MG/ML IJ SOLN
4.0000 mg | Freq: Once | INTRAMUSCULAR | Status: AC
  Administered 2013-04-11: 4 mg via INTRAVENOUS
  Filled 2013-04-11: qty 1

## 2013-04-11 MED ORDER — POTASSIUM CHLORIDE CRYS ER 20 MEQ PO TBCR
40.0000 meq | EXTENDED_RELEASE_TABLET | Freq: Once | ORAL | Status: AC
  Administered 2013-04-11: 40 meq via ORAL
  Filled 2013-04-11: qty 2

## 2013-04-11 MED ORDER — IOHEXOL 300 MG/ML  SOLN
100.0000 mL | Freq: Once | INTRAMUSCULAR | Status: AC | PRN
  Administered 2013-04-11: 100 mL via INTRAVENOUS

## 2013-04-11 MED ORDER — IOHEXOL 300 MG/ML  SOLN
50.0000 mL | Freq: Once | INTRAMUSCULAR | Status: AC | PRN
  Administered 2013-04-11: 50 mL via ORAL

## 2013-04-11 MED ORDER — PROMETHAZINE HCL 25 MG/ML IJ SOLN
12.5000 mg | Freq: Once | INTRAMUSCULAR | Status: AC
  Administered 2013-04-11: 12.5 mg via INTRAVENOUS
  Filled 2013-04-11: qty 1

## 2013-04-11 NOTE — ED Notes (Signed)
Patient tolerating fluids well, no vomiting.

## 2013-04-11 NOTE — ED Provider Notes (Signed)
Repeat lactate 2.1. Patient relaxing comfortably, repeat abdominal exam benign. Stable for discharge.  1. Abdominal pain      Dagmar Hait, MD 04/11/13 [phone]

## 2013-04-11 NOTE — ED Provider Notes (Signed)
CSN: [id number]     Arrival date & time 04/11/13  0256 History   First MD Initiated Contact with Patient 04/11/13 0316     Chief Complaint  Patient presents with  . Abdominal Pain  . Emesis   (Consider location/radiation/quality/duration/timing/severity/associated sxs/prior Treatment) HPI Comments: Patient complains of one month history of gradually worsening diffuse abdominal pain with bloating and nausea. She's had previous ED visits for the same and seeing GI without diagnosis. She also complains of ongoing low back pain which is a chronic problem for years and is unchanged. Denies any focal weakness, numbness or tingling. Denies any fevers. Denies any dysuria or hematuria. Denies any bowel or bladder incontinence. She has had intermittent episodes of constipation Stacia small bowel movement yesterday. No vomiting today. She is a decreased appetite. Her GI specialist is referred her back to neurosurgery at they think her back may be causing her abdominal pain.  The history is provided by the patient.    Past Medical History  Diagnosis Date  . Hypertension   . High cholesterol   . Chronic back pain   . Anxiety   . Shortness of breath      even walking causes sob...   . Arthritis     in back .   Marland Kitchen Pneumonia   . Borderline diabetes    Past Surgical History  Procedure Laterality Date  . Abdominal hysterectomy    . Toe surgery    . Dilation and curettage of uterus    . Breast surgery  unknown (many yrs)    excision  of lump right breast  was per pt "nothing"   . Cardiac catheterization    . Colonoscopy  04/16/2007    RMR: Normal rectum and colon  . Colonoscopy with esophagogastroduodenoscopy (egd) N/A 01/08/2013    WUJ:WJXB erosive reflux esophagitis. Negative H.pylori. Small hiatal hernia-s/p bx-TCS/Colonic polyp-hyperplastic   Family History  Problem Relation Age of Onset  . Colon cancer Neg Hx    History  Substance Use Topics  . Smoking status: Never Smoker   .  Smokeless tobacco: Not on file  . Alcohol Use: No   OB History   Grav Para Term Preterm Abortions TAB SAB Ect Mult Living                 Review of Systems  Constitutional: Positive for activity change, appetite change and fatigue. Negative for fever.  HENT: Negative for rhinorrhea.   Eyes: Negative for visual disturbance.  Respiratory: Negative for cough, chest tightness and shortness of breath.   Cardiovascular: Negative for chest pain.  Gastrointestinal: Positive for nausea, abdominal pain and constipation. Negative for vomiting and diarrhea.  Genitourinary: Negative for dysuria, hematuria, vaginal bleeding and vaginal discharge.  Musculoskeletal: Positive for back pain.  Skin: Negative for rash.  Neurological: Negative for dizziness, weakness and headaches.  A complete 10 system review of systems was obtained and all systems are negative except as noted in the HPI and PMH.    Allergies  Review of patient's allergies indicates no known allergies.  Home Medications   Current Outpatient Rx  Name  Route  Sig  Dispense  Refill  . HYDROcodone-acetaminophen (NORCO/VICODIN) 5-325 MG per tablet   Oral   Take 1 tablet by mouth every 6 (six) hours as needed.          Marland Kitchen olmesartan (BENICAR) 40 MG tablet   Oral   Take 40 mg by mouth daily.         Marland Kitchen  ondansetron (ZOFRAN ODT) 4 MG disintegrating tablet   Oral   Take 1 tablet (4 mg total) by mouth every 8 (eight) hours as needed for nausea.   20 tablet   0   . ondansetron (ZOFRAN-ODT) 8 MG disintegrating tablet   Oral   Take 1 tablet (8 mg total) by mouth every 8 (eight) hours as needed for nausea.   20 tablet   0   . polyethylene glycol (MIRALAX / GLYCOLAX) packet   Oral   Take 17 g by mouth daily.         . potassium chloride (K-DUR) 10 MEQ tablet   Oral   Take 10 mEq by mouth daily.          BP 134/68  Pulse 94  Temp(Src) 98.7 F (37.1 C) (Oral)  Resp 18  Ht 5\' 4"  (1.626 m)  Wt 160 lb (72.576 kg)  BMI  27.45 kg/m2  SpO2 98% Physical Exam  Constitutional: She is oriented to person, place, and time. She appears well-developed and well-nourished. No distress.  HENT:  Head: Normocephalic and atraumatic.  Mouth/Throat: Oropharynx is clear and moist. No oropharyngeal exudate.  Eyes: Conjunctivae and EOM are normal. Pupils are equal, round, and reactive to light.  Neck: Normal range of motion. Neck supple.  Cardiovascular: Normal rate, regular rhythm and normal heart sounds.   No murmur heard. Pulmonary/Chest: Effort normal and breath sounds normal. No respiratory distress.  Abdominal: Soft. There is no tenderness. There is no rebound and no guarding.  Musculoskeletal: Normal range of motion. She exhibits tenderness. She exhibits no edema.  5/5 strength in bilateral lower extremities. Ankle plantar and dorsiflexion intact. Great toe extension intact bilaterally. +2 DP and PT pulses. +2 patellar reflexes bilaterally. Normal gait. Paraspinal lumbar tenderness. No midline tenderness   Neurological: She is alert and oriented to person, place, and time. No cranial nerve deficit. She exhibits normal muscle tone. Coordination normal.  Skin: Skin is warm.    ED Course  Procedures (including critical care time) Labs Review Labs Reviewed  CBC WITH DIFFERENTIAL - Abnormal; Notable for the following:    Platelets 413 (*)    All other components within normal limits  COMPREHENSIVE METABOLIC PANEL - Abnormal; Notable for the following:    Potassium 3.3 (*)    CO2 18 (*)    Glucose, Bld 120 (*)    ALT 48 (*)    GFR calc non Af Amer 67 (*)    GFR calc Af Amer 78 (*)    All other components within normal limits  LACTIC ACID, PLASMA - Abnormal; Notable for the following:    Lactic Acid, Venous 2.7 (*)    All other components within normal limits  URINALYSIS, ROUTINE W REFLEX MICROSCOPIC - Abnormal; Notable for the following:    Hgb urine dipstick TRACE (*)    All other components within normal  limits  LACTIC ACID, PLASMA - Abnormal; Notable for the following:    Lactic Acid, Venous 2.8 (*)    All other components within normal limits  BASIC METABOLIC PANEL - Abnormal; Notable for the following:    Potassium 3.3 (*)    GFR calc non Af Amer 71 (*)    GFR calc Af Amer 82 (*)    All other components within normal limits  URINE MICROSCOPIC-ADD ON - Abnormal; Notable for the following:    Squamous Epithelial / LPF FEW (*)    Bacteria, UA FEW (*)    All other components within  normal limits  LIPASE, BLOOD  TROPONIN I   Imaging Review Ct Abdomen Pelvis W Contrast  04/11/2013   *RADIOLOGY REPORT*  Clinical Data: Abdominal pain and vomiting.  CT ABDOMEN AND PELVIS WITH CONTRAST  Technique:  Multidetector CT imaging of the abdomen and pelvis was performed following the standard protocol during bolus administration of intravenous contrast.  Contrast: 100 mL of Omnipaque 300 IV contrast  Comparison: CT of the abdomen and pelvis performed 03/26/2013  Findings: The visualized lung bases are clear.  The liver and spleen are unremarkable in appearance.  The gallbladder is within normal limits.  The pancreas and adrenal glands are unremarkable.  There is minimal right-sided hydronephrosis, of uncertain significance; this appears similar to the prior study.  There is no evidence of distal obstruction.  The left kidney is unremarkable in appearance.  No perinephric stranding is appreciated.  A 1.2 cm cyst is noted at the upper pole of the right kidney.  No free fluid is identified.  The small bowel is unremarkable in appearance.  The stomach is within normal limits.  No acute vascular abnormalities are seen.  Minimal calcification is noted along the abdominal aorta.  The appendix is normal in caliber and contains air, without evidence for appendicitis.  Contrast progresses to the left of the transverse colon.  The colon is unremarkable in appearance.  The bladder is mildly distended and grossly  unremarkable in appearance.  The patient is status post hysterectomy.  No suspicious adnexal masses are seen.  No inguinal lymphadenopathy is seen.  No acute osseous abnormalities are identified.  Intervertebral disc space narrowing is noted at L1-L2, with associated vacuum phenomenon and sclerotic change.  IMPRESSION:  1.  No acute abnormality seen within the abdomen or pelvis. 2.  Minimal chronic right-sided hydronephrosis, without evidence of distal obstruction. 3.  Small right renal cyst seen.   Original Report Authenticated By: Tonia Ghent, M.D.    EKG Interpretation   None       MDM   1. Abdominal pain    One month history of diffuse abdominal pain with bloating and nausea. Ongoing back pain for several years it is unchanged today. Previous ER evaluations for same noted. No bowel bladder incontinence. No fevers or vomiting. No weakness, numbness or tingling. Equal strength lower extremities. No evidence of cauda equina or cord compression.  Patient has been on linzess per GI and referred to neurosurgery as it was thought her back may be contributing to her abdominal pain.  Patient received an ultrasound of the abdomen on 03/12/2013 that was normal with no gallstones. 03/25/2013 patient received a CT of the abdomen and pelvis that showed no acute inflammatory process in the pelvis or abdomen with no evidence of colitis or diverticulitis. MRI of the lumbar spine on 03/30/2013 that showed multi-level lumbar spondylosis, unchanged from prior exam of October, 2013.  Labs show mild elevation of lactate 2.7.  Bicarb 18. Anion gap 15.  Suspect dehydration, doubt mesenteric ischemia as abdomen is completely soft but will check CT. Vessels appeared patent on recent imaging.   CT is negative for acute pathology. Patient is tolerating fluids in the ED and has not had any vomiting. Will recheck lactate after hydration.   Date: 04/11/2013  Rate: 80  Rhythm: normal sinus rhythm  QRS Axis:  normal  Intervals: normal  ST/T Wave abnormalities: normal  Conduction Disutrbances:none  Narrative Interpretation:   Old EKG Reviewed: unchanged    Glynn Octave, MD 04/11/13 [phone]

## 2013-04-11 NOTE — ED Notes (Signed)
Abdominal pain and bloating, vomiting for a couple of days, also c/o back pain

## 2013-04-12 DIAGNOSIS — M545 Low back pain, unspecified: Secondary | ICD-10-CM | POA: Diagnosis not present

## 2013-04-12 DIAGNOSIS — R109 Unspecified abdominal pain: Secondary | ICD-10-CM | POA: Diagnosis not present

## 2013-04-12 DIAGNOSIS — R110 Nausea: Secondary | ICD-10-CM | POA: Diagnosis not present

## 2013-04-12 DIAGNOSIS — N281 Cyst of kidney, acquired: Secondary | ICD-10-CM | POA: Diagnosis not present

## 2013-04-12 DIAGNOSIS — R197 Diarrhea, unspecified: Secondary | ICD-10-CM | POA: Diagnosis not present

## 2013-04-12 DIAGNOSIS — I10 Essential (primary) hypertension: Secondary | ICD-10-CM | POA: Diagnosis not present

## 2013-04-12 DIAGNOSIS — R1084 Generalized abdominal pain: Secondary | ICD-10-CM | POA: Diagnosis not present

## 2013-04-12 DIAGNOSIS — R5381 Other malaise: Secondary | ICD-10-CM | POA: Diagnosis not present

## 2013-04-12 DIAGNOSIS — R630 Anorexia: Secondary | ICD-10-CM | POA: Diagnosis not present

## 2013-04-12 DIAGNOSIS — G8929 Other chronic pain: Secondary | ICD-10-CM | POA: Diagnosis not present

## 2013-04-13 ENCOUNTER — Encounter (HOSPITAL_COMMUNITY): Payer: Self-pay | Admitting: Emergency Medicine

## 2013-04-13 ENCOUNTER — Telehealth: Payer: Self-pay | Admitting: Internal Medicine

## 2013-04-13 ENCOUNTER — Emergency Department (HOSPITAL_COMMUNITY)
Admission: EM | Admit: 2013-04-13 | Discharge: 2013-04-13 | Disposition: A | Discharge status: Home / Self Care | Payer: Medicare Other | Attending: Emergency Medicine | Admitting: Emergency Medicine

## 2013-04-13 ENCOUNTER — Emergency Department (HOSPITAL_COMMUNITY): Payer: Medicare Other

## 2013-04-13 DIAGNOSIS — E7800 Pure hypercholesterolemia, unspecified: Secondary | ICD-10-CM | POA: Insufficient documentation

## 2013-04-13 DIAGNOSIS — R110 Nausea: Secondary | ICD-10-CM | POA: Diagnosis not present

## 2013-04-13 DIAGNOSIS — M129 Arthropathy, unspecified: Secondary | ICD-10-CM | POA: Insufficient documentation

## 2013-04-13 DIAGNOSIS — M545 Low back pain, unspecified: Secondary | ICD-10-CM | POA: Diagnosis not present

## 2013-04-13 DIAGNOSIS — Z79899 Other long term (current) drug therapy: Secondary | ICD-10-CM | POA: Insufficient documentation

## 2013-04-13 DIAGNOSIS — I10 Essential (primary) hypertension: Secondary | ICD-10-CM | POA: Diagnosis not present

## 2013-04-13 DIAGNOSIS — Z8701 Personal history of pneumonia (recurrent): Secondary | ICD-10-CM | POA: Diagnosis not present

## 2013-04-13 DIAGNOSIS — R1084 Generalized abdominal pain: Secondary | ICD-10-CM | POA: Insufficient documentation

## 2013-04-13 DIAGNOSIS — K5900 Constipation, unspecified: Secondary | ICD-10-CM | POA: Diagnosis not present

## 2013-04-13 DIAGNOSIS — G8929 Other chronic pain: Secondary | ICD-10-CM | POA: Diagnosis not present

## 2013-04-13 DIAGNOSIS — R109 Unspecified abdominal pain: Secondary | ICD-10-CM | POA: Insufficient documentation

## 2013-04-13 DIAGNOSIS — Z8659 Personal history of other mental and behavioral disorders: Secondary | ICD-10-CM | POA: Insufficient documentation

## 2013-04-13 HISTORY — DX: Other constipation: K59.09

## 2013-04-13 HISTORY — DX: Other chronic pain: G89.29

## 2013-04-13 HISTORY — DX: Unspecified abdominal pain: R10.9

## 2013-04-13 HISTORY — DX: Nausea: R11.0

## 2013-04-13 LAB — CBC WITH DIFFERENTIAL/PLATELET
Basophils Absolute: 0 10*3/uL (ref 0.0–0.1)
Basophils Relative: 1 % (ref 0–1)
Eosinophils Absolute: 0.1 10*3/uL (ref 0.0–0.7)
Eosinophils Relative: 1 % (ref 0–5)
HCT: 39 % (ref 36.0–46.0)
Hemoglobin: 13.2 g/dL (ref 12.0–15.0)
Lymphocytes Relative: 32 % (ref 12–46)
Lymphs Abs: 1.5 10*3/uL (ref 0.7–4.0)
MCH: 30.6 pg (ref 26.0–34.0)
MCHC: 33.8 g/dL (ref 30.0–36.0)
MCV: 90.3 fL (ref 78.0–100.0)
Monocytes Absolute: 0.3 10*3/uL (ref 0.1–1.0)
Monocytes Relative: 7 % (ref 3–12)
Neutro Abs: 2.7 10*3/uL (ref 1.7–7.7)
Neutrophils Relative %: 59 % (ref 43–77)
Platelets: 453 10*3/uL — ABNORMAL HIGH (ref 150–400)
RBC: 4.32 MIL/uL (ref 3.87–5.11)
RDW: 13 % (ref 11.5–15.5)
WBC: 4.6 10*3/uL (ref 4.0–10.5)

## 2013-04-13 LAB — COMPREHENSIVE METABOLIC PANEL
ALT: 45 U/L — ABNORMAL HIGH (ref 0–35)
AST: 36 U/L (ref 0–37)
Albumin: 4 g/dL (ref 3.5–5.2)
Alkaline Phosphatase: 82 U/L (ref 39–117)
BUN: 5 mg/dL — ABNORMAL LOW (ref 6–23)
CO2: 18 mEq/L — ABNORMAL LOW (ref 19–32)
Calcium: 10.3 mg/dL (ref 8.4–10.5)
Chloride: 105 mEq/L (ref 96–112)
Creatinine, Ser: 0.98 mg/dL (ref 0.50–1.10)
GFR calc Af Amer: 66 mL/min — ABNORMAL LOW (ref >90)
GFR calc non Af Amer: 57 mL/min — ABNORMAL LOW (ref >90)
Glucose, Bld: 101 mg/dL — ABNORMAL HIGH (ref 70–99)
Potassium: 3.3 mEq/L — ABNORMAL LOW (ref 3.5–5.1)
Sodium: 141 mEq/L (ref 135–145)
Total Bilirubin: 0.8 mg/dL (ref 0.3–1.2)
Total Protein: 7.3 g/dL (ref 6.0–8.3)

## 2013-04-13 LAB — URINALYSIS W MICROSCOPIC + REFLEX CULTURE
Bilirubin Urine: NEGATIVE
Glucose, UA: NEGATIVE mg/dL
Leukocytes, UA: NEGATIVE
Nitrite: NEGATIVE
Protein, ur: NEGATIVE mg/dL
Specific Gravity, Urine: 1.025 (ref 1.005–1.030)
Urobilinogen, UA: 0.2 mg/dL (ref 0.0–1.0)
pH: 6.5 (ref 5.0–8.0)

## 2013-04-13 LAB — LIPASE, BLOOD: Lipase: 18 U/L (ref 11–59)

## 2013-04-13 LAB — OCCULT BLOOD, POC DEVICE: Fecal Occult Bld: NEGATIVE

## 2013-04-13 LAB — TROPONIN I: Troponin I: <0.3 ng/mL (ref <0.30)

## 2013-04-13 MED ORDER — GI COCKTAIL ~~LOC~~
30.0000 mL | Freq: Once | ORAL | Status: AC
  Administered 2013-04-13: 30 mL via ORAL
  Filled 2013-04-13: qty 30

## 2013-04-13 MED ORDER — FAMOTIDINE IN NACL 20-0.9 MG/50ML-% IV SOLN
20.0000 mg | Freq: Once | INTRAVENOUS | Status: AC
  Administered 2013-04-13: 20 mg via INTRAVENOUS
  Filled 2013-04-13: qty 50

## 2013-04-13 MED ORDER — ONDANSETRON HCL 4 MG PO TABS: 4.0000 mg | ORAL_TABLET | Freq: Three times a day (TID) | ORAL | Status: DC | PRN

## 2013-04-13 MED ORDER — ONDANSETRON HCL 4 MG/2ML IJ SOLN
4.0000 mg | INTRAMUSCULAR | Status: DC | PRN
  Administered 2013-04-13: 4 mg via INTRAVENOUS
  Filled 2013-04-13: qty 2

## 2013-04-13 MED ORDER — MORPHINE SULFATE 4 MG/ML IJ SOLN
4.0000 mg | INTRAMUSCULAR | Status: DC | PRN
  Administered 2013-04-13: 4 mg via INTRAVENOUS
  Filled 2013-04-13: qty 1

## 2013-04-13 MED ORDER — SODIUM CHLORIDE 0.9 % IV SOLN
INTRAVENOUS | Status: DC
  Administered 2013-04-13: 09:00:00 via INTRAVENOUS

## 2013-04-13 NOTE — ED Notes (Signed)
Pt reports being sick w/ nausea, left flank pain, and generalized ab pain for 1 month, pain comes and goes. Has been to her pmd and ed, unable to find the cause. Denies any vomiting, diarrhea or fever. No urinary s/s

## 2013-04-13 NOTE — Telephone Encounter (Signed)
Pt and her family member stopped by the office window earlier saying that the ED told her to come here and we would refer her to a pain clinic. After waiting they decided to leave because she was doubled over in pain and asked if we could call her at [phone]. Please advise if I need to make her OV or if this is something we can refer her to without OV.
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
## 2013-04-13 NOTE — ED Notes (Signed)
Sprite given to pt

## 2013-04-13 NOTE — ED Notes (Signed)
Tolerated sprite without difficulty. 

## 2013-04-13 NOTE — ED Notes (Signed)
Pt requesting pain medication.  

## 2013-04-13 NOTE — Telephone Encounter (Signed)
Patient was supposed to have gone to F/U with Dr. Mauri Pole

## 2013-04-14 ENCOUNTER — Other Ambulatory Visit: Payer: Self-pay | Admitting: Gastroenterology

## 2013-04-14 ENCOUNTER — Telehealth: Payer: Self-pay | Admitting: *Deleted

## 2013-04-14 DIAGNOSIS — R52 Pain, unspecified: Secondary | ICD-10-CM

## 2013-04-14 NOTE — Telephone Encounter (Signed)
Benedetto Goad, please refer to pain management. Darl Pikes, please schedule ov with RMR

## 2013-04-14 NOTE — Telephone Encounter (Signed)
Thanks for checking.  Yes, let's refer to Pain Management. Needs appt with RMR ONLY in the next available.

## 2013-04-14 NOTE — Telephone Encounter (Signed)
I reviewed the most recent CT with Dr. Tyron Russell again. I had previously reviewed an earlier CT. The mesenteric vasculature is patent. SMA, celiac, and IMA patent. He notes a few scattered plaques but nothing of significance. In his opinion, a CTA is not necessary.   I am so sorry she is dealing with this. Is she on any pain medications? I want to see what Dr. Newell Coral says on Thursday. We may need to refer to Pain Management.

## 2013-04-14 NOTE — Telephone Encounter (Signed)
Referral has been made to Dr. Doonquah for Pain Management  

## 2013-04-14 NOTE — Telephone Encounter (Signed)
Pt did not see Dr. Farrel Conners the 10th pt's daughter states the doctor's office cancelled the appt. And rescheduled it for this coming Thursday. Pt's daughter states pt has been going back and forth to the ER pt's daughter states her understanding was pt was to be seen here within 3 days. Please advise [phone]

## 2013-04-14 NOTE — Telephone Encounter (Signed)
Pt is aware of OV on 10/31 at 0830 with RMR and appt card was mailed

## 2013-04-14 NOTE — Telephone Encounter (Signed)
Spoke with pts daugterJasmine December [phone]) pt does have rx for hydrocodone. Advised her to make sure she doesn't take any additional tylenol with that. They are aware of her appt with back doctor this week. They do want a referral to pain management. Is it ok to send referral.

## 2013-04-14 NOTE — Telephone Encounter (Signed)
Routing to AS 

## 2013-04-15 ENCOUNTER — Encounter (HOSPITAL_COMMUNITY): Payer: Self-pay | Admitting: Emergency Medicine

## 2013-04-15 ENCOUNTER — Emergency Department (HOSPITAL_COMMUNITY)
Admission: EM | Admit: 2013-04-15 | Discharge: 2013-04-15 | Disposition: A | Discharge status: Home / Self Care | Payer: Medicare Other | Attending: Emergency Medicine | Admitting: Emergency Medicine

## 2013-04-15 DIAGNOSIS — R0602 Shortness of breath: Secondary | ICD-10-CM | POA: Diagnosis not present

## 2013-04-15 DIAGNOSIS — I10 Essential (primary) hypertension: Secondary | ICD-10-CM | POA: Diagnosis not present

## 2013-04-15 DIAGNOSIS — Z862 Personal history of diseases of the blood and blood-forming organs and certain disorders involving the immune mechanism: Secondary | ICD-10-CM | POA: Diagnosis not present

## 2013-04-15 DIAGNOSIS — Z8639 Personal history of other endocrine, nutritional and metabolic disease: Secondary | ICD-10-CM | POA: Insufficient documentation

## 2013-04-15 DIAGNOSIS — M549 Dorsalgia, unspecified: Secondary | ICD-10-CM | POA: Diagnosis not present

## 2013-04-15 DIAGNOSIS — M479 Spondylosis, unspecified: Secondary | ICD-10-CM | POA: Diagnosis not present

## 2013-04-15 DIAGNOSIS — Z8719 Personal history of other diseases of the digestive system: Secondary | ICD-10-CM | POA: Insufficient documentation

## 2013-04-15 DIAGNOSIS — R509 Fever, unspecified: Secondary | ICD-10-CM | POA: Insufficient documentation

## 2013-04-15 DIAGNOSIS — R109 Unspecified abdominal pain: Secondary | ICD-10-CM | POA: Diagnosis not present

## 2013-04-15 DIAGNOSIS — Z8701 Personal history of pneumonia (recurrent): Secondary | ICD-10-CM | POA: Diagnosis not present

## 2013-04-15 DIAGNOSIS — Z79899 Other long term (current) drug therapy: Secondary | ICD-10-CM | POA: Insufficient documentation

## 2013-04-15 DIAGNOSIS — R1013 Epigastric pain: Secondary | ICD-10-CM | POA: Diagnosis not present

## 2013-04-15 DIAGNOSIS — Z8659 Personal history of other mental and behavioral disorders: Secondary | ICD-10-CM | POA: Insufficient documentation

## 2013-04-15 DIAGNOSIS — G8929 Other chronic pain: Secondary | ICD-10-CM | POA: Insufficient documentation

## 2013-04-15 DIAGNOSIS — R300 Dysuria: Secondary | ICD-10-CM | POA: Insufficient documentation

## 2013-04-15 DIAGNOSIS — R110 Nausea: Secondary | ICD-10-CM | POA: Insufficient documentation

## 2013-04-15 LAB — URINE MICROSCOPIC-ADD ON

## 2013-04-15 LAB — COMPREHENSIVE METABOLIC PANEL
ALT: 87 U/L — ABNORMAL HIGH (ref 0–35)
AST: 63 U/L — ABNORMAL HIGH (ref 0–37)
Albumin: 3.9 g/dL (ref 3.5–5.2)
Alkaline Phosphatase: 79 U/L (ref 39–117)
BUN: 9 mg/dL (ref 6–23)
CO2: 23 mEq/L (ref 19–32)
Calcium: 10.1 mg/dL (ref 8.4–10.5)
Chloride: 101 mEq/L (ref 96–112)
Creatinine, Ser: 0.83 mg/dL (ref 0.50–1.10)
GFR calc Af Amer: 81 mL/min — ABNORMAL LOW (ref >90)
GFR calc non Af Amer: 70 mL/min — ABNORMAL LOW (ref >90)
Glucose, Bld: 90 mg/dL (ref 70–99)
Potassium: 3.6 mEq/L (ref 3.5–5.1)
Sodium: 137 mEq/L (ref 135–145)
Total Bilirubin: 0.6 mg/dL (ref 0.3–1.2)
Total Protein: 7.1 g/dL (ref 6.0–8.3)

## 2013-04-15 LAB — CBC WITH DIFFERENTIAL/PLATELET
Basophils Absolute: 0.1 10*3/uL (ref 0.0–0.1)
Basophils Relative: 1 % (ref 0–1)
Eosinophils Absolute: 0.1 10*3/uL (ref 0.0–0.7)
Eosinophils Relative: 1 % (ref 0–5)
HCT: 37.5 % (ref 36.0–46.0)
Hemoglobin: 12.8 g/dL (ref 12.0–15.0)
Lymphocytes Relative: 30 % (ref 12–46)
Lymphs Abs: 1.8 10*3/uL (ref 0.7–4.0)
MCH: 30.8 pg (ref 26.0–34.0)
MCHC: 34.1 g/dL (ref 30.0–36.0)
MCV: 90.4 fL (ref 78.0–100.0)
Monocytes Absolute: 0.5 10*3/uL (ref 0.1–1.0)
Monocytes Relative: 8 % (ref 3–12)
Neutro Abs: 3.5 10*3/uL (ref 1.7–7.7)
Neutrophils Relative %: 60 % (ref 43–77)
Platelets: 444 10*3/uL — ABNORMAL HIGH (ref 150–400)
RBC: 4.15 MIL/uL (ref 3.87–5.11)
RDW: 13.2 % (ref 11.5–15.5)
WBC: 5.9 10*3/uL (ref 4.0–10.5)

## 2013-04-15 LAB — URINALYSIS, ROUTINE W REFLEX MICROSCOPIC
Glucose, UA: NEGATIVE mg/dL
Ketones, ur: 15 mg/dL — AB
Leukocytes, UA: NEGATIVE
Nitrite: NEGATIVE
Protein, ur: NEGATIVE mg/dL
Specific Gravity, Urine: >1.03 — ABNORMAL HIGH (ref 1.005–1.030)
Urobilinogen, UA: 0.2 mg/dL (ref 0.0–1.0)
pH: 6 (ref 5.0–8.0)

## 2013-04-15 LAB — LIPASE, BLOOD: Lipase: 18 U/L (ref 11–59)

## 2013-04-15 MED ORDER — MORPHINE SULFATE 2 MG/ML IJ SOLN
2.0000 mg | Freq: Once | INTRAMUSCULAR | Status: AC
  Administered 2013-04-15: 2 mg via INTRAVENOUS
  Filled 2013-04-15: qty 1

## 2013-04-15 MED ORDER — ONDANSETRON 4 MG PO TBDP: 4.0000 mg | ORAL_TABLET | Freq: Three times a day (TID) | ORAL | Status: DC | PRN

## 2013-04-15 MED ORDER — SODIUM CHLORIDE 0.9 % IV SOLN
INTRAVENOUS | Status: DC
  Administered 2013-04-15: 19:00:00 via INTRAVENOUS

## 2013-04-15 MED ORDER — ONDANSETRON HCL 4 MG/2ML IJ SOLN
4.0000 mg | Freq: Once | INTRAMUSCULAR | Status: AC
  Administered 2013-04-15: 4 mg via INTRAVENOUS
  Filled 2013-04-15: qty 2

## 2013-04-15 MED ORDER — SODIUM CHLORIDE 0.9 % IV BOLUS (SEPSIS)
500.0000 mL | Freq: Once | INTRAVENOUS | Status: AC
  Administered 2013-04-15: 19:00:00 via INTRAVENOUS

## 2013-04-15 NOTE — ED Notes (Signed)
[employer]S students attempting to place IV in pt without success. Pt tolerated well.

## 2013-04-15 NOTE — ED Notes (Signed)
Pt c/o pain in abd and back with n/v x 1 month.  Also reports fever, nausea, and vomiting.  Says has been seen here multiple times for same.  Denies diarrhea. LBM was today.

## 2013-04-15 NOTE — ED Provider Notes (Addendum)
CSN: [id number]     Arrival date & time 04/15/13  1430 History  This chart was scribed for Audrey Jakes, MD by Leone Payor, ED Scribe. This patient was seen in room APA12/[facility]12 and the patient's care was started 4:58 PM.    Chief Complaint  Patient presents with  . Abdominal Pain  . Back Pain  . Emesis    The history is provided by the patient. No language interpreter was used.    HPI Comments: Audrey Johnson is a 72 y.o. female who presents to the Emergency Department complaining of ongoing, constant, unchanged abdominal pain for about 1 month. She states the pain starts in the epigastric region and radiates to the rest of the abdomen. She rates this pain as 8/10. She has associated nausea, fevers, and questionable dysuria. She reports decreased PO recently. Pt has been seen about 4 times for the same symptoms. Pt reports she was seen by GI specialists Dr. Darrick Penna on 03/26/13. Pt was last seen here 04/13/13 by Dr. Clarene Duke. She denies emesis, diarrhea.   PCP Dr. Margo Aye.  Past Medical History  Diagnosis Date  . Hypertension   . High cholesterol   . Chronic back pain   . Anxiety   . Shortness of breath      even walking causes sob...   . Arthritis     in back .   Marland Kitchen Pneumonia   . Borderline diabetes   . Chronic abdominal pain   . Chronic constipation   . Chronic nausea    Past Surgical History  Procedure Laterality Date  . Abdominal hysterectomy    . Toe surgery    . Dilation and curettage of uterus    . Breast surgery  unknown (many yrs)    excision  of lump right breast  was per pt "nothing"   . Cardiac catheterization    . Colonoscopy  04/16/2007    RMR: Normal rectum and colon  . Colonoscopy with esophagogastroduodenoscopy (egd) N/A 01/08/2013    WUJ:WJXB erosive reflux esophagitis. Negative H.pylori. Small hiatal hernia-s/p bx-TCS/Colonic polyp-hyperplastic   Family History  Problem Relation Age of Onset  . Colon cancer Neg Hx    History  Substance Use  Topics  . Smoking status: Never Smoker   . Smokeless tobacco: Not on file  . Alcohol Use: No   OB History   Grav Para Term Preterm Abortions TAB SAB Ect Mult Living                 Review of Systems  Constitutional: Positive for fever.  HENT: Negative for rhinorrhea and sore throat.   Respiratory: Positive for shortness of breath. Negative for cough.   Cardiovascular: Negative for chest pain and leg swelling.  Gastrointestinal: Positive for nausea and abdominal pain. Negative for vomiting and diarrhea.  Genitourinary: Positive for dysuria and flank pain.  Musculoskeletal: Positive for back pain.  Skin: Negative for rash.  Neurological: Negative for headaches.  Hematological: Does not bruise/bleed easily.  Psychiatric/Behavioral: Negative for confusion.  All other systems reviewed and are negative.    Allergies  Review of patient's allergies indicates no known allergies.  Home Medications   Current Outpatient Rx  Name  Route  Sig  Dispense  Refill  . HYDROcodone-acetaminophen (NORCO/VICODIN) 5-325 MG per tablet   Oral   Take 1 tablet by mouth every 8 (eight) hours as needed for pain.         Marland Kitchen olmesartan-hydrochlorothiazide (BENICAR HCT) 40-12.5 MG per tablet  Oral   Take 1 tablet by mouth daily.         . ondansetron (ZOFRAN ODT) 4 MG disintegrating tablet   Oral   Take 1 tablet (4 mg total) by mouth every 8 (eight) hours as needed.   10 tablet   0    BP 142/68  Pulse 92  Temp(Src) 99.1 F (37.3 C) (Oral)  Resp 20  Wt 160 lb (72.576 kg)  BMI 26.63 kg/m2  SpO2 99% Physical Exam  Nursing note and vitals reviewed. Constitutional: She is oriented to person, place, and time. She appears well-developed and well-nourished.  HENT:  Head: Normocephalic and atraumatic.  Mouth/Throat: Oropharynx is clear and moist.  Eyes: Conjunctivae and EOM are normal. Pupils are equal, round, and reactive to light.  Neck: Normal range of motion. Neck supple.   Cardiovascular: Normal rate, regular rhythm and normal heart sounds.  Exam reveals no gallop and no friction rub.   No murmur heard. Pulmonary/Chest: Effort normal and breath sounds normal. No respiratory distress. She has no wheezes. She has no rales. She exhibits no tenderness.  Abdominal: Soft. Bowel sounds are normal. She exhibits no distension and no mass. There is tenderness (mild tenderness to suprapubic area). There is no rebound and no guarding.  Musculoskeletal: Normal range of motion. She exhibits no edema.  Neurological: She is alert and oriented to person, place, and time.  Skin: Skin is warm and dry.  Psychiatric: She has a normal mood and affect.    ED Course  Procedures   DIAGNOSTIC STUDIES: Oxygen Saturation is 100% on RA, normal by my interpretation.    COORDINATION OF CARE: 5:04 PM Discussed treatment plan with pt at bedside and pt agreed to plan.    Labs Review Labs Reviewed  URINALYSIS, ROUTINE W REFLEX MICROSCOPIC - Abnormal; Notable for the following:    Specific Gravity, Urine >1.030 (*)    Hgb urine dipstick TRACE (*)    Bilirubin Urine SMALL (*)    Ketones, ur 15 (*)    All other components within normal limits  COMPREHENSIVE METABOLIC PANEL - Abnormal; Notable for the following:    AST 63 (*)    ALT 87 (*)    GFR calc non Af Amer 70 (*)    GFR calc Af Amer 81 (*)    All other components within normal limits  CBC WITH DIFFERENTIAL - Abnormal; Notable for the following:    Platelets 444 (*)    All other components within normal limits  URINE MICROSCOPIC-ADD ON - Abnormal; Notable for the following:    Squamous Epithelial / LPF MANY (*)    Bacteria, UA MANY (*)    All other components within normal limits  LIPASE, BLOOD   Results for orders placed during the hospital encounter of 04/15/13  URINALYSIS, ROUTINE W REFLEX MICROSCOPIC      Result Value Range   Color, Urine YELLOW  YELLOW   APPearance CLEAR  CLEAR   Specific Gravity, Urine >1.030  (*) 1.005 - 1.030   pH 6.0  5.0 - 8.0   Glucose, UA NEGATIVE  NEGATIVE mg/dL   Hgb urine dipstick TRACE (*) NEGATIVE   Bilirubin Urine SMALL (*) NEGATIVE   Ketones, ur 15 (*) NEGATIVE mg/dL   Protein, ur NEGATIVE  NEGATIVE mg/dL   Urobilinogen, UA 0.2  0.0 - 1.0 mg/dL   Nitrite NEGATIVE  NEGATIVE   Leukocytes, UA NEGATIVE  NEGATIVE  COMPREHENSIVE METABOLIC PANEL      Result Value Range  Sodium 137  135 - 145 mEq/L   Potassium 3.6  3.5 - 5.1 mEq/L   Chloride 101  96 - 112 mEq/L   CO2 23  19 - 32 mEq/L   Glucose, Bld 90  70 - 99 mg/dL   BUN 9  6 - 23 mg/dL   Creatinine, Ser 4.09  0.50 - 1.10 mg/dL   Calcium 81.1  8.4 - 91.4 mg/dL   Total Protein 7.1  6.0 - 8.3 g/dL   Albumin 3.9  3.5 - 5.2 g/dL   AST 63 (*) 0 - 37 U/L   ALT 87 (*) 0 - 35 U/L   Alkaline Phosphatase 79  39 - 117 U/L   Total Bilirubin 0.6  0.3 - 1.2 mg/dL   GFR calc non Af Amer 70 (*) >90 mL/min   GFR calc Af Amer 81 (*) >90 mL/min  LIPASE, BLOOD      Result Value Range   Lipase 18  11 - 59 U/L  CBC WITH DIFFERENTIAL      Result Value Range   WBC 5.9  4.0 - 10.5 K/uL   RBC 4.15  3.87 - 5.11 MIL/uL   Hemoglobin 12.8  12.0 - 15.0 g/dL   HCT 78.2  95.6 - 21.3 %   MCV 90.4  78.0 - 100.0 fL   MCH 30.8  26.0 - 34.0 pg   MCHC 34.1  30.0 - 36.0 g/dL   RDW 08.6  57.8 - 46.9 %   Platelets 444 (*) 150 - 400 K/uL   Neutrophils Relative % 60  43 - 77 %   Neutro Abs 3.5  1.7 - 7.7 K/uL   Lymphocytes Relative 30  12 - 46 %   Lymphs Abs 1.8  0.7 - 4.0 K/uL   Monocytes Relative 8  3 - 12 %   Monocytes Absolute 0.5  0.1 - 1.0 K/uL   Eosinophils Relative 1  0 - 5 %   Eosinophils Absolute 0.1  0.0 - 0.7 K/uL   Basophils Relative 1  0 - 1 %   Basophils Absolute 0.1  0.0 - 0.1 K/uL  URINE MICROSCOPIC-ADD ON      Result Value Range   Squamous Epithelial / LPF MANY (*) RARE   WBC, UA 0-2  <3 WBC/hpf   RBC / HPF 3-6  <3 RBC/hpf   Bacteria, UA MANY (*) RARE   Urine-Other MUCOUS PRESENT       Imaging Review No results  found.  EKG Interpretation   None       MDM   1. Chronic abdominal pain    Patient with history of chronic abdominal pain. Today's labs actually improved from before. No acute surgical abdominal process based on exam. Patient can be discharged home followup with her primary care Dr. Patient has been seen by GI in the past. Patient most likely may require referral to additional GI specialist or chronic pain management.   I personally performed the services described in this documentation, which was scribed in my presence. The recorded information has been reviewed and is accurate.    Audrey Jakes, MD 04/15/13 2032  Audrey Jakes, MD 04/15/13 2034

## 2013-04-15 NOTE — ED Notes (Signed)
Pt returns to ED for worsening abdominal pain x 2 weeks with N/V/D. Pt was seen in ED 10/13 and at another local facility in past month for said pain.  Pt reports n/v has worsened over past month causing her to not eat and drink.  NAD noted at this time. No vomiting noted.

## 2013-04-16 ENCOUNTER — Other Ambulatory Visit: Payer: Self-pay | Admitting: Gastroenterology

## 2013-04-16 ENCOUNTER — Telehealth: Payer: Self-pay | Admitting: Gastroenterology

## 2013-04-16 DIAGNOSIS — M5126 Other intervertebral disc displacement, lumbar region: Secondary | ICD-10-CM | POA: Diagnosis not present

## 2013-04-16 DIAGNOSIS — M545 Low back pain, unspecified: Secondary | ICD-10-CM | POA: Diagnosis not present

## 2013-04-16 DIAGNOSIS — M546 Pain in thoracic spine: Secondary | ICD-10-CM | POA: Diagnosis not present

## 2013-04-16 DIAGNOSIS — M48061 Spinal stenosis, lumbar region without neurogenic claudication: Secondary | ICD-10-CM | POA: Diagnosis not present

## 2013-04-16 DIAGNOSIS — N1330 Unspecified hydronephrosis: Secondary | ICD-10-CM

## 2013-04-16 NOTE — Telephone Encounter (Signed)
I reviewed CT again. I wanted to make sure she had a nephrologist; if not, she needs to see one due to chronic right-sided  hydronephrosis, without evidence of  distal obstruction.

## 2013-04-16 NOTE — Telephone Encounter (Signed)
Referral has been made with Dr. Kristian Covey

## 2013-04-18 ENCOUNTER — Emergency Department (HOSPITAL_COMMUNITY)
Admission: EM | Admit: 2013-04-18 | Discharge: 2013-04-18 | Disposition: A | Discharge status: Home / Self Care | Payer: Medicare Other | Attending: Emergency Medicine | Admitting: Emergency Medicine

## 2013-04-18 ENCOUNTER — Encounter (HOSPITAL_COMMUNITY): Payer: Self-pay | Admitting: Emergency Medicine

## 2013-04-18 ENCOUNTER — Emergency Department (HOSPITAL_COMMUNITY)
Admission: EM | Admit: 2013-04-18 | Discharge: 2013-04-18 | Disposition: A | Discharge status: Home / Self Care | Payer: Medicare Other | Source: Home / Self Care | Attending: Emergency Medicine | Admitting: Emergency Medicine

## 2013-04-18 DIAGNOSIS — G8929 Other chronic pain: Secondary | ICD-10-CM

## 2013-04-18 DIAGNOSIS — Z862 Personal history of diseases of the blood and blood-forming organs and certain disorders involving the immune mechanism: Secondary | ICD-10-CM | POA: Insufficient documentation

## 2013-04-18 DIAGNOSIS — Z8701 Personal history of pneumonia (recurrent): Secondary | ICD-10-CM | POA: Insufficient documentation

## 2013-04-18 DIAGNOSIS — M129 Arthropathy, unspecified: Secondary | ICD-10-CM | POA: Insufficient documentation

## 2013-04-18 DIAGNOSIS — R112 Nausea with vomiting, unspecified: Secondary | ICD-10-CM | POA: Insufficient documentation

## 2013-04-18 DIAGNOSIS — I10 Essential (primary) hypertension: Secondary | ICD-10-CM | POA: Insufficient documentation

## 2013-04-18 DIAGNOSIS — Z79899 Other long term (current) drug therapy: Secondary | ICD-10-CM | POA: Insufficient documentation

## 2013-04-18 DIAGNOSIS — F411 Generalized anxiety disorder: Secondary | ICD-10-CM | POA: Insufficient documentation

## 2013-04-18 DIAGNOSIS — Z8659 Personal history of other mental and behavioral disorders: Secondary | ICD-10-CM | POA: Insufficient documentation

## 2013-04-18 DIAGNOSIS — R1084 Generalized abdominal pain: Secondary | ICD-10-CM | POA: Diagnosis not present

## 2013-04-18 DIAGNOSIS — K219 Gastro-esophageal reflux disease without esophagitis: Secondary | ICD-10-CM | POA: Insufficient documentation

## 2013-04-18 DIAGNOSIS — Z8719 Personal history of other diseases of the digestive system: Secondary | ICD-10-CM | POA: Insufficient documentation

## 2013-04-18 DIAGNOSIS — Z9114 Patient's other noncompliance with medication regimen: Secondary | ICD-10-CM

## 2013-04-18 DIAGNOSIS — Z8639 Personal history of other endocrine, nutritional and metabolic disease: Secondary | ICD-10-CM | POA: Insufficient documentation

## 2013-04-18 DIAGNOSIS — R197 Diarrhea, unspecified: Secondary | ICD-10-CM | POA: Diagnosis not present

## 2013-04-18 DIAGNOSIS — Z91199 Patient's noncompliance with other medical treatment and regimen due to unspecified reason: Secondary | ICD-10-CM | POA: Insufficient documentation

## 2013-04-18 DIAGNOSIS — R1012 Left upper quadrant pain: Secondary | ICD-10-CM | POA: Insufficient documentation

## 2013-04-18 DIAGNOSIS — Z9119 Patient's noncompliance with other medical treatment and regimen: Secondary | ICD-10-CM | POA: Diagnosis not present

## 2013-04-18 HISTORY — DX: Patient's other noncompliance with medication regimen for other reason: Z91.148

## 2013-04-18 HISTORY — DX: Patient's other noncompliance with medication regimen: Z91.14

## 2013-04-18 HISTORY — DX: Gastro-esophageal reflux disease without esophagitis: K21.9

## 2013-04-18 LAB — COMPREHENSIVE METABOLIC PANEL
ALT: 108 U/L — ABNORMAL HIGH (ref 0–35)
AST: 64 U/L — ABNORMAL HIGH (ref 0–37)
Albumin: 4.1 g/dL (ref 3.5–5.2)
Alkaline Phosphatase: 85 U/L (ref 39–117)
BUN: 10 mg/dL (ref 6–23)
CO2: 22 mEq/L (ref 19–32)
Calcium: 10.3 mg/dL (ref 8.4–10.5)
Chloride: 98 mEq/L (ref 96–112)
Creatinine, Ser: 0.8 mg/dL (ref 0.50–1.10)
GFR calc Af Amer: 85 mL/min — ABNORMAL LOW (ref >90)
GFR calc non Af Amer: 73 mL/min — ABNORMAL LOW (ref >90)
Glucose, Bld: 129 mg/dL — ABNORMAL HIGH (ref 70–99)
Potassium: 3.5 mEq/L (ref 3.5–5.1)
Sodium: 135 mEq/L (ref 135–145)
Total Bilirubin: 0.6 mg/dL (ref 0.3–1.2)
Total Protein: 7.4 g/dL (ref 6.0–8.3)

## 2013-04-18 LAB — CBC WITH DIFFERENTIAL/PLATELET
Basophils Absolute: 0 10*3/uL (ref 0.0–0.1)
Basophils Relative: 1 % (ref 0–1)
Eosinophils Absolute: 0 10*3/uL (ref 0.0–0.7)
Eosinophils Relative: 1 % (ref 0–5)
HCT: 37.4 % (ref 36.0–46.0)
Hemoglobin: 12.8 g/dL (ref 12.0–15.0)
Lymphocytes Relative: 21 % (ref 12–46)
Lymphs Abs: 1.3 10*3/uL (ref 0.7–4.0)
MCH: 30.8 pg (ref 26.0–34.0)
MCHC: 34.2 g/dL (ref 30.0–36.0)
MCV: 89.9 fL (ref 78.0–100.0)
Monocytes Absolute: 0.5 10*3/uL (ref 0.1–1.0)
Monocytes Relative: 8 % (ref 3–12)
Neutro Abs: 4.2 10*3/uL (ref 1.7–7.7)
Neutrophils Relative %: 69 % (ref 43–77)
Platelets: 448 10*3/uL — ABNORMAL HIGH (ref 150–400)
RBC: 4.16 MIL/uL (ref 3.87–5.11)
RDW: 13 % (ref 11.5–15.5)
WBC: 6.1 10*3/uL (ref 4.0–10.5)

## 2013-04-18 LAB — LIPASE, BLOOD: Lipase: 14 U/L (ref 11–59)

## 2013-04-18 MED ORDER — SODIUM CHLORIDE 0.9 % IV BOLUS (SEPSIS)
1000.0000 mL | Freq: Once | INTRAVENOUS | Status: AC
  Administered 2013-04-18: 1000 mL via INTRAVENOUS

## 2013-04-18 MED ORDER — HYDROCODONE-ACETAMINOPHEN 5-325 MG PO TABS
1.0000 | ORAL_TABLET | Freq: Once | ORAL | Status: AC
  Administered 2013-04-18: 1 via ORAL
  Filled 2013-04-18: qty 1

## 2013-04-18 MED ORDER — ONDANSETRON 8 MG PO TBDP
8.0000 mg | ORAL_TABLET | Freq: Once | ORAL | Status: AC
  Administered 2013-04-18: 8 mg via ORAL
  Filled 2013-04-18: qty 1

## 2013-04-18 MED ORDER — GI COCKTAIL ~~LOC~~
30.0000 mL | Freq: Once | ORAL | Status: AC
  Administered 2013-04-18: 30 mL via ORAL
  Filled 2013-04-18: qty 30

## 2013-04-18 MED ORDER — FAMOTIDINE 20 MG PO TABS
40.0000 mg | ORAL_TABLET | Freq: Once | ORAL | Status: AC
  Administered 2013-04-18: 40 mg via ORAL
  Filled 2013-04-18: qty 2

## 2013-04-18 MED ORDER — FENTANYL CITRATE 0.05 MG/ML IJ SOLN
100.0000 ug | Freq: Once | INTRAMUSCULAR | Status: AC
  Administered 2013-04-18: 100 ug via INTRAVENOUS
  Filled 2013-04-18: qty 2

## 2013-04-18 MED ORDER — METOCLOPRAMIDE HCL 5 MG/ML IJ SOLN
10.0000 mg | Freq: Once | INTRAMUSCULAR | Status: AC
  Administered 2013-04-18: 10 mg via INTRAVENOUS
  Filled 2013-04-18: qty 2

## 2013-04-18 NOTE — ED Notes (Signed)
Pt given some water to drink, states she is feeling better now but is scared to try & drink anything other than water now.

## 2013-04-18 NOTE — ED Notes (Signed)
EDP in room, Pt here for the second time today w/ chronic abdominal pain, EDP went through pt medications. Pt states the same pain she has been having.

## 2013-04-18 NOTE — ED Provider Notes (Addendum)
CSN: [id number]     Arrival date & time 04/18/13  0126 History   First MD Initiated Contact with Patient 04/18/13 0141     Chief Complaint  Patient presents with  . Abdominal Pain   (Consider location/radiation/quality/duration/timing/severity/associated sxs/prior Treatment) HPI This is a 72 year old female with a history of chronic abdominal pain for over a month. The pain is generalized but primarily located in the epigastrium radiating to the left flank. It is described as constant and severe. It is associated with nausea, vomiting and diarrhea. She has had multiple workups in the ED and as an outpatient in the past month including upper endoscopy, CT abdomen and pelvis and ultrasound without a definitive diagnosis. She is here with an exacerbation that occurred yesterday after she tried eating something. She states that eating usually causes the pain to worsen and for this reason she has not been eating regularly.  Past Medical History  Diagnosis Date  . Hypertension   . High cholesterol   . Chronic back pain   . Anxiety   . Shortness of breath      even walking causes sob...   . Arthritis     in back .   Marland Kitchen Pneumonia   . Borderline diabetes   . Chronic abdominal pain   . Chronic constipation   . Chronic nausea    Past Surgical History  Procedure Laterality Date  . Abdominal hysterectomy    . Toe surgery    . Dilation and curettage of uterus    . Breast surgery  unknown (many yrs)    excision  of lump right breast  was per pt "nothing"   . Cardiac catheterization    . Colonoscopy  04/16/2007    RMR: Normal rectum and colon  . Colonoscopy with esophagogastroduodenoscopy (egd) N/A 01/08/2013    JXB:JYNW erosive reflux esophagitis. Negative H.pylori. Small hiatal hernia-s/p bx-TCS/Colonic polyp-hyperplastic   Family History  Problem Relation Age of Onset  . Colon cancer Neg Hx    History  Substance Use Topics  . Smoking status: Never Smoker   . Smokeless tobacco: Not on  file  . Alcohol Use: No   OB History   Grav Para Term Preterm Abortions TAB SAB Ect Mult Living                 Review of Systems  All other systems reviewed and are negative.    Allergies  Review of patient's allergies indicates no known allergies.  Home Medications   Current Outpatient Rx  Name  Route  Sig  Dispense  Refill  . HYDROcodone-acetaminophen (NORCO/VICODIN) 5-325 MG per tablet   Oral   Take 1 tablet by mouth every 8 (eight) hours as needed for pain.         Marland Kitchen olmesartan-hydrochlorothiazide (BENICAR HCT) 40-12.5 MG per tablet   Oral   Take 1 tablet by mouth daily.         . ondansetron (ZOFRAN ODT) 4 MG disintegrating tablet   Oral   Take 1 tablet (4 mg total) by mouth every 8 (eight) hours as needed.   10 tablet   0    BP 156/83  Pulse 95  Temp(Src) 99.6 F (37.6 C) (Oral)  Resp 22  Ht 5\' 4"  (1.626 m)  Wt 157 lb (71.215 kg)  BMI 26.94 kg/m2  SpO2 99%  Physical Exam General: Well-developed, well-nourished female in no acute distress; appearance consistent with age of record HENT: normocephalic; atraumatic Eyes: pupils equal, round and  reactive to light; extraocular muscles intact; arcus senilis bilaterally Neck: supple Heart: regular rate and rhythm Lungs: clear to auscultation bilaterally Abdomen: soft; nondistended; mild epigastric and left upper quadrant tenderness; no masses or hepatosplenomegaly; bowel sounds present Extremities: No deformity; full range of motion; pulses normal; no edema Neurologic: Awake, alert and oriented; motor function intact in all extremities and symmetric; no facial droop Skin: Warm and dry Psychiatric: Mildly anxious    ED Course  Procedures (including critical care time)  MDM   Nursing notes and vitals signs, including pulse oximetry, reviewed.  Summary of this visit's results, reviewed by myself:  Labs:  Results for orders placed during the hospital encounter of 04/18/13 (from the past 24 hour(s))   COMPREHENSIVE METABOLIC PANEL     Status: Abnormal   Collection Time    04/18/13  2:02 AM      Result Value Range   Sodium 135  135 - 145 mEq/L   Potassium 3.5  3.5 - 5.1 mEq/L   Chloride 98  96 - 112 mEq/L   CO2 22  19 - 32 mEq/L   Glucose, Bld 129 (*) 70 - 99 mg/dL   BUN 10  6 - 23 mg/dL   Creatinine, Ser 1.61  0.50 - 1.10 mg/dL   Calcium 09.6  8.4 - 04.5 mg/dL   Total Protein 7.4  6.0 - 8.3 g/dL   Albumin 4.1  3.5 - 5.2 g/dL   AST 64 (*) 0 - 37 U/L   ALT 108 (*) 0 - 35 U/L   Alkaline Phosphatase 85  39 - 117 U/L   Total Bilirubin 0.6  0.3 - 1.2 mg/dL   GFR calc non Af Amer 73 (*) >90 mL/min   GFR calc Af Amer 85 (*) >90 mL/min  CBC WITH DIFFERENTIAL     Status: Abnormal   Collection Time    04/18/13  2:02 AM      Result Value Range   WBC 6.1  4.0 - 10.5 K/uL   RBC 4.16  3.87 - 5.11 MIL/uL   Hemoglobin 12.8  12.0 - 15.0 g/dL   HCT 40.9  81.1 - 91.4 %   MCV 89.9  78.0 - 100.0 fL   MCH 30.8  26.0 - 34.0 pg   MCHC 34.2  30.0 - 36.0 g/dL   RDW 78.2  95.6 - 21.3 %   Platelets 448 (*) 150 - 400 K/uL   Neutrophils Relative % 69  43 - 77 %   Neutro Abs 4.2  1.7 - 7.7 K/uL   Lymphocytes Relative 21  12 - 46 %   Lymphs Abs 1.3  0.7 - 4.0 K/uL   Monocytes Relative 8  3 - 12 %   Monocytes Absolute 0.5  0.1 - 1.0 K/uL   Eosinophils Relative 1  0 - 5 %   Eosinophils Absolute 0.0  0.0 - 0.7 K/uL   Basophils Relative 1  0 - 1 %   Basophils Absolute 0.0  0.0 - 0.1 K/uL  LIPASE, BLOOD     Status: None   Collection Time    04/18/13  2:02 AM      Result Value Range   Lipase 14  11 - 59 U/L   2:58 AM Pain control with IV fentanyl. Patient able to drink water without emesis however she states "drinking it hurts my stomach". From a review of her records it appears that this is characteristic of her chronic abdominal pain and chronic nausea. She has Zofran  and hydrocodone at home. She does not appear to have an acute abdomen on exam at this time and her laboratory studies are within  normal limits except for slightly elevated transaminases which are nonspecific. Her abdomen remains soft, nondistended with mild epigastric and LUQ tenderness.   Hanley Seamen, MD 04/18/13 7829  Hanley Seamen, MD 04/18/13 5621
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
## 2013-04-18 NOTE — ED Notes (Signed)
Patient complaining of diffuse abdominal pain radiating into back. Reports started approximately a month ago, recently seen in ED for same. States emesis today as well.

## 2013-04-18 NOTE — ED Notes (Signed)
Patient states that she is having abdominal pain, nausea, and vomiting per pt.

## 2013-04-18 NOTE — ED Notes (Signed)
Pt alert & oriented x4, stable gait. Patient given discharge instructions, paperwork & prescription(s). Patient  instructed to stop at the registration desk to finish any additional paperwork. Patient verbalized understanding. Pt left department w/ no further questions. 

## 2013-04-21 ENCOUNTER — Emergency Department (HOSPITAL_COMMUNITY)
Admission: EM | Admit: 2013-04-21 | Discharge: 2013-04-21 | Disposition: A | Discharge status: Home / Self Care | Payer: Medicare Other | Attending: Emergency Medicine | Admitting: Emergency Medicine

## 2013-04-21 ENCOUNTER — Encounter (HOSPITAL_COMMUNITY): Payer: Self-pay | Admitting: Emergency Medicine

## 2013-04-21 DIAGNOSIS — R1084 Generalized abdominal pain: Secondary | ICD-10-CM | POA: Diagnosis not present

## 2013-04-21 DIAGNOSIS — M129 Arthropathy, unspecified: Secondary | ICD-10-CM | POA: Diagnosis not present

## 2013-04-21 DIAGNOSIS — F411 Generalized anxiety disorder: Secondary | ICD-10-CM | POA: Insufficient documentation

## 2013-04-21 DIAGNOSIS — Z8701 Personal history of pneumonia (recurrent): Secondary | ICD-10-CM | POA: Diagnosis not present

## 2013-04-21 DIAGNOSIS — R112 Nausea with vomiting, unspecified: Secondary | ICD-10-CM | POA: Diagnosis not present

## 2013-04-21 DIAGNOSIS — Z79899 Other long term (current) drug therapy: Secondary | ICD-10-CM | POA: Insufficient documentation

## 2013-04-21 DIAGNOSIS — R197 Diarrhea, unspecified: Secondary | ICD-10-CM | POA: Insufficient documentation

## 2013-04-21 DIAGNOSIS — I10 Essential (primary) hypertension: Secondary | ICD-10-CM | POA: Insufficient documentation

## 2013-04-21 DIAGNOSIS — Z862 Personal history of diseases of the blood and blood-forming organs and certain disorders involving the immune mechanism: Secondary | ICD-10-CM | POA: Insufficient documentation

## 2013-04-21 DIAGNOSIS — K219 Gastro-esophageal reflux disease without esophagitis: Secondary | ICD-10-CM | POA: Diagnosis not present

## 2013-04-21 DIAGNOSIS — Z792 Long term (current) use of antibiotics: Secondary | ICD-10-CM | POA: Diagnosis not present

## 2013-04-21 DIAGNOSIS — G8929 Other chronic pain: Secondary | ICD-10-CM | POA: Diagnosis not present

## 2013-04-21 DIAGNOSIS — R109 Unspecified abdominal pain: Secondary | ICD-10-CM

## 2013-04-21 DIAGNOSIS — Z8639 Personal history of other endocrine, nutritional and metabolic disease: Secondary | ICD-10-CM | POA: Insufficient documentation

## 2013-04-21 LAB — COMPREHENSIVE METABOLIC PANEL
ALT: 103 U/L — ABNORMAL HIGH (ref 0–35)
AST: 56 U/L — ABNORMAL HIGH (ref 0–37)
Albumin: 3.9 g/dL (ref 3.5–5.2)
Alkaline Phosphatase: 84 U/L (ref 39–117)
BUN: 10 mg/dL (ref 6–23)
CO2: 23 mEq/L (ref 19–32)
Calcium: 10.3 mg/dL (ref 8.4–10.5)
Chloride: 97 mEq/L (ref 96–112)
Creatinine, Ser: 0.95 mg/dL (ref 0.50–1.10)
GFR calc Af Amer: 69 mL/min — ABNORMAL LOW (ref >90)
GFR calc non Af Amer: 59 mL/min — ABNORMAL LOW (ref >90)
Glucose, Bld: 108 mg/dL — ABNORMAL HIGH (ref 70–99)
Potassium: 3.4 mEq/L — ABNORMAL LOW (ref 3.5–5.1)
Sodium: 136 mEq/L (ref 135–145)
Total Bilirubin: 0.7 mg/dL (ref 0.3–1.2)
Total Protein: 7.1 g/dL (ref 6.0–8.3)

## 2013-04-21 LAB — CBC WITH DIFFERENTIAL/PLATELET
Basophils Absolute: 0.1 10*3/uL (ref 0.0–0.1)
Basophils Relative: 1 % (ref 0–1)
Eosinophils Absolute: 0 10*3/uL (ref 0.0–0.7)
Eosinophils Relative: 1 % (ref 0–5)
HCT: 37.9 % (ref 36.0–46.0)
Hemoglobin: 13 g/dL (ref 12.0–15.0)
Lymphocytes Relative: 25 % (ref 12–46)
Lymphs Abs: 1.3 10*3/uL (ref 0.7–4.0)
MCH: 30.6 pg (ref 26.0–34.0)
MCHC: 34.3 g/dL (ref 30.0–36.0)
MCV: 89.2 fL (ref 78.0–100.0)
Monocytes Absolute: 0.5 10*3/uL (ref 0.1–1.0)
Monocytes Relative: 9 % (ref 3–12)
Neutro Abs: 3.4 10*3/uL (ref 1.7–7.7)
Neutrophils Relative %: 64 % (ref 43–77)
Platelets: 470 10*3/uL — ABNORMAL HIGH (ref 150–400)
RBC: 4.25 MIL/uL (ref 3.87–5.11)
RDW: 13.1 % (ref 11.5–15.5)
WBC: 5.3 10*3/uL (ref 4.0–10.5)

## 2013-04-21 LAB — URINALYSIS, ROUTINE W REFLEX MICROSCOPIC
Glucose, UA: NEGATIVE mg/dL
Ketones, ur: 15 mg/dL — AB
Leukocytes, UA: NEGATIVE
Nitrite: NEGATIVE
Protein, ur: NEGATIVE mg/dL
Specific Gravity, Urine: 1.025 (ref 1.005–1.030)
Urobilinogen, UA: 0.2 mg/dL (ref 0.0–1.0)
pH: 6.5 (ref 5.0–8.0)

## 2013-04-21 LAB — URINE MICROSCOPIC-ADD ON

## 2013-04-21 LAB — LACTIC ACID, PLASMA: Lactic Acid, Venous: 2.1 mmol/L (ref 0.5–2.2)

## 2013-04-21 LAB — LIPASE, BLOOD: Lipase: 22 U/L (ref 11–59)

## 2013-04-21 MED ORDER — HYDROCODONE-ACETAMINOPHEN 5-325 MG PO TABS: 1.0000 | ORAL_TABLET | ORAL | Status: DC | PRN

## 2013-04-21 MED ORDER — HYDROCODONE-ACETAMINOPHEN 5-325 MG PO TABS
1.0000 | ORAL_TABLET | Freq: Once | ORAL | Status: AC
  Administered 2013-04-21: 1 via ORAL
  Filled 2013-04-21: qty 1

## 2013-04-21 MED ORDER — PANTOPRAZOLE SODIUM 40 MG IV SOLR
40.0000 mg | Freq: Once | INTRAVENOUS | Status: AC
  Administered 2013-04-21: 40 mg via INTRAVENOUS
  Filled 2013-04-21: qty 40

## 2013-04-21 MED ORDER — GI COCKTAIL ~~LOC~~
30.0000 mL | Freq: Once | ORAL | Status: AC
  Administered 2013-04-21: 30 mL via ORAL
  Filled 2013-04-21: qty 30

## 2013-04-21 NOTE — ED Notes (Signed)
Pt given ginger ale and crackers with peanut butter.  Tolerating well at this time.

## 2013-04-21 NOTE — ED Provider Notes (Addendum)
This chart was scribed for Audrey Maw Macey Wurtz, DO by Caryn Bee, ED Scribe. This patient was seen in room APA10/[facility]10.  TIME SEEN: 10:58 AM  CHIEF COMPLAINT: abdominal pain  HPI: Pt is 72 y.o. female with a history of hypertension, hyperlipidemia, diabetes presents with constant generalized abdominal pain that began about 1 month ago. She describes the pain as aching and burning. Pain is worsened by eating and drinking. She has been eating less because she states that eating makes her feel sick. Pt also reports associated nausea and has episodes of emesis when she tries to eat. She also has diarrhea and black stools that began last week.   This is pt's 8th visit in one month for the same.  She is followed by gastroenterology. She has had an unremarkable colonoscopy in July 2014. Endoscopy at the same time showed reflux esophagitis and small hiatal hernia. Patient is currently being empirically treated for H. pylori. She is also on PPI.  ROS: See HPI Constitutional: no fever  Eyes: no drainage  ENT: no runny nose   Cardiovascular:  no chest pain  Resp: no SOB  GI: abdominal pain, vomiting, diarrhea, black stools, no hematochezia GU: no dysuria Integumentary: no rash  Allergy: no hives  Musculoskeletal: no leg swelling  Neurological: no slurred speech ROS otherwise negative  PAST MEDICAL HISTORY/PAST SURGICAL HISTORY:  Past Medical History  Diagnosis Date  . Hypertension   . High cholesterol   . Chronic back pain   . Anxiety   . Shortness of breath      even walking causes sob...   . Arthritis     in back .   Marland Kitchen Pneumonia   . Borderline diabetes   . Chronic abdominal pain   . Chronic constipation   . Chronic nausea   . Noncompliance with medication regimen   . GERD (gastroesophageal reflux disease)     MEDICATIONS:  Prior to Admission medications   Medication Sig Start Date End Date Taking? Authorizing Provider  amoxicillin (AMOXIL) 500 MG capsule Take 1,000 mg by mouth 2  (two) times daily.   Yes Historical Provider, MD  clarithromycin (BIAXIN) 500 MG tablet Take 500 mg by mouth 2 (two) times daily.   Yes Historical Provider, MD  famotidine (PEPCID) 20 MG tablet Take 20 mg by mouth daily.   Yes Historical Provider, MD  gabapentin (NEURONTIN) 100 MG capsule Take 100 mg by mouth 3 (three) times daily as needed (Back Pain).   Yes Historical Provider, MD  HYDROcodone-acetaminophen (NORCO/VICODIN) 5-325 MG per tablet Take 1 tablet by mouth every 8 (eight) hours as needed for pain.   Yes Historical Provider, MD  omeprazole (PRILOSEC) 20 MG capsule Take 20 mg by mouth daily. For acid reflux 04/12/13  Yes Historical Provider, MD    ALLERGIES:  No Known Allergies  SOCIAL HISTORY:  History  Substance Use Topics  . Smoking status: Never Smoker   . Smokeless tobacco: Not on file  . Alcohol Use: No    FAMILY HISTORY: Family History  Problem Relation Age of Onset  . Colon cancer Neg Hx     EXAM: Triage Vitals: BP 144/55  Pulse 102  Temp(Src) 99.2 F (37.3 C) (Oral)  Resp 18  Ht 5\' 4"  (1.626 m)  Wt 153 lb (69.4 kg)  BMI 26.25 kg/m2  SpO2 100% CONSTITUTIONAL: Alert and oriented and responds appropriately to questions. Well-appearing; well-nourished HEAD: Normocephalic EYES: Conjunctivae clear, PERRL ENT: normal nose; no rhinorrhea; moist mucous membranes; pharynx without lesions  noted NECK: Supple, no meningismus, no LAD  CARD: RRR; S1 and S2 appreciated; no murmurs, no clicks, no rubs, no gallops RESP: Normal chest excursion without splinting or tachypnea; breath sounds clear and equal bilaterally; no wheezes, no rhonchi, no rales,  ABD/GI: Normal bowel sounds; non-distended; soft, mild, diffusely tender abdomen without rebound, guarding or peritoneal signs RECTAL:  No gross blood, no melena, normal tone, no hemorrhoids, nontender exam BACK:  The back appears normal and is non-tender to palpation, there is no CVA tenderness EXT: Normal ROM in all joints;  non-tender to palpation; no edema; normal capillary refill; no cyanosis    SKIN: Normal color for age and race; warm NEURO: Moves all extremities equally PSYCH: The patient's mood and manner are appropriate. Grooming and personal hygiene are appropriate.  MEDICAL DECISION MAKING: Patient here with her chronic abdominal pain. She has gastroenterology followup. Will obtain labs, urine. CT abdomen and pelvis on 04/11/13 shows no acute abnormality. Patient has been amount of chronic right-sided hydronephrosis. Given her benign exam, I do not feel she needs repeat imaging at this time.  ED PROGRESS: Labs, urine unremarkable. Patient able to tolerate by mouth without difficulty. Given strict return precautions. Have informed patient she will need to followup with her gastroenterologist for further management for her chronic pain. Patient and daughter verbalize understanding and are comfortable with this plan.   Results for orders placed during the hospital encounter of 04/21/13  CBC WITH DIFFERENTIAL      Result Value Range   WBC 5.3  4.0 - 10.5 K/uL   RBC 4.25  3.87 - 5.11 MIL/uL   Hemoglobin 13.0  12.0 - 15.0 g/dL   HCT 40.9  81.1 - 91.4 %   MCV 89.2  78.0 - 100.0 fL   MCH 30.6  26.0 - 34.0 pg   MCHC 34.3  30.0 - 36.0 g/dL   RDW 78.2  95.6 - 21.3 %   Platelets 470 (*) 150 - 400 K/uL   Neutrophils Relative % 64  43 - 77 %   Neutro Abs 3.4  1.7 - 7.7 K/uL   Lymphocytes Relative 25  12 - 46 %   Lymphs Abs 1.3  0.7 - 4.0 K/uL   Monocytes Relative 9  3 - 12 %   Monocytes Absolute 0.5  0.1 - 1.0 K/uL   Eosinophils Relative 1  0 - 5 %   Eosinophils Absolute 0.0  0.0 - 0.7 K/uL   Basophils Relative 1  0 - 1 %   Basophils Absolute 0.1  0.0 - 0.1 K/uL  COMPREHENSIVE METABOLIC PANEL      Result Value Range   Sodium 136  135 - 145 mEq/L   Potassium 3.4 (*) 3.5 - 5.1 mEq/L   Chloride 97  96 - 112 mEq/L   CO2 23  19 - 32 mEq/L   Glucose, Bld 108 (*) 70 - 99 mg/dL   BUN 10  6 - 23 mg/dL    Creatinine, Ser 0.86  0.50 - 1.10 mg/dL   Calcium 57.8  8.4 - 46.9 mg/dL   Total Protein 7.1  6.0 - 8.3 g/dL   Albumin 3.9  3.5 - 5.2 g/dL   AST 56 (*) 0 - 37 U/L   ALT 103 (*) 0 - 35 U/L   Alkaline Phosphatase 84  39 - 117 U/L   Total Bilirubin 0.7  0.3 - 1.2 mg/dL   GFR calc non Af Amer 59 (*) >90 mL/min   GFR calc Af  Amer 69 (*) >90 mL/min  LIPASE, BLOOD      Result Value Range   Lipase 22  11 - 59 U/L  URINALYSIS, ROUTINE W REFLEX MICROSCOPIC      Result Value Range   Color, Urine YELLOW  YELLOW   APPearance CLEAR  CLEAR   Specific Gravity, Urine 1.025  1.005 - 1.030   pH 6.5  5.0 - 8.0   Glucose, UA NEGATIVE  NEGATIVE mg/dL   Hgb urine dipstick TRACE (*) NEGATIVE   Bilirubin Urine SMALL (*) NEGATIVE   Ketones, ur 15 (*) NEGATIVE mg/dL   Protein, ur NEGATIVE  NEGATIVE mg/dL   Urobilinogen, UA 0.2  0.0 - 1.0 mg/dL   Nitrite NEGATIVE  NEGATIVE   Leukocytes, UA NEGATIVE  NEGATIVE  LACTIC ACID, PLASMA      Result Value Range   Lactic Acid, Venous 2.1  0.5 - 2.2 mmol/L  URINE MICROSCOPIC-ADD ON      Result Value Range   Squamous Epithelial / LPF MANY (*) RARE   WBC, UA 0-2  <3 WBC/hpf   RBC / HPF 3-6  <3 RBC/hpf   Bacteria, UA FEW (*) RARE   Casts GRANULAR CAST (*) NEGATIVE   Urine-Other MUCOUS PRESENT          Audrey Maw Deara Bober, DO 04/21/13 1521  Audrey Maw Avital Dancy, DO 04/21/13 1555

## 2013-04-21 NOTE — ED Notes (Addendum)
Patient to ED with c/o abdominal pain. Left sided abdominal pain. Burning pain 10/10 constant pain. States nausea, vomiting X2, diarrhea X2 in the last 24 hours. States that she has not been able to eat and drink. States she feels like she has a fever, has not taken temperature at home. Pepcid, hydrocodone , omeprazole taken at 0800 this morning with no relief of the pain. No abdominal pain like this before "until a month ago, when it started". States she has been to the ED approx 4 times for the same problem in the last month. Patient has 99.56F temperature orally. Patient moaning and states that she is cold. Transferred by wheelchair to room.

## 2013-04-23 ENCOUNTER — Emergency Department (HOSPITAL_COMMUNITY)
Admission: EM | Admit: 2013-04-23 | Discharge: 2013-04-23 | Disposition: A | Discharge status: Home / Self Care | Payer: Medicare Other | Attending: Emergency Medicine | Admitting: Emergency Medicine

## 2013-04-23 ENCOUNTER — Encounter (HOSPITAL_COMMUNITY): Payer: Self-pay | Admitting: Emergency Medicine

## 2013-04-23 DIAGNOSIS — Z9119 Patient's noncompliance with other medical treatment and regimen: Secondary | ICD-10-CM | POA: Diagnosis not present

## 2013-04-23 DIAGNOSIS — G8929 Other chronic pain: Secondary | ICD-10-CM

## 2013-04-23 DIAGNOSIS — R630 Anorexia: Secondary | ICD-10-CM | POA: Insufficient documentation

## 2013-04-23 DIAGNOSIS — Z862 Personal history of diseases of the blood and blood-forming organs and certain disorders involving the immune mechanism: Secondary | ICD-10-CM | POA: Diagnosis not present

## 2013-04-23 DIAGNOSIS — Z90710 Acquired absence of both cervix and uterus: Secondary | ICD-10-CM | POA: Insufficient documentation

## 2013-04-23 DIAGNOSIS — Z79899 Other long term (current) drug therapy: Secondary | ICD-10-CM | POA: Insufficient documentation

## 2013-04-23 DIAGNOSIS — Z8639 Personal history of other endocrine, nutritional and metabolic disease: Secondary | ICD-10-CM | POA: Insufficient documentation

## 2013-04-23 DIAGNOSIS — K219 Gastro-esophageal reflux disease without esophagitis: Secondary | ICD-10-CM | POA: Insufficient documentation

## 2013-04-23 DIAGNOSIS — M129 Arthropathy, unspecified: Secondary | ICD-10-CM | POA: Diagnosis not present

## 2013-04-23 DIAGNOSIS — I10 Essential (primary) hypertension: Secondary | ICD-10-CM | POA: Insufficient documentation

## 2013-04-23 DIAGNOSIS — R112 Nausea with vomiting, unspecified: Secondary | ICD-10-CM | POA: Insufficient documentation

## 2013-04-23 DIAGNOSIS — R1084 Generalized abdominal pain: Secondary | ICD-10-CM | POA: Insufficient documentation

## 2013-04-23 DIAGNOSIS — F411 Generalized anxiety disorder: Secondary | ICD-10-CM | POA: Insufficient documentation

## 2013-04-23 DIAGNOSIS — Z8701 Personal history of pneumonia (recurrent): Secondary | ICD-10-CM | POA: Diagnosis not present

## 2013-04-23 DIAGNOSIS — R4182 Altered mental status, unspecified: Secondary | ICD-10-CM | POA: Diagnosis not present

## 2013-04-23 DIAGNOSIS — Z9889 Other specified postprocedural states: Secondary | ICD-10-CM | POA: Insufficient documentation

## 2013-04-23 DIAGNOSIS — R1032 Left lower quadrant pain: Secondary | ICD-10-CM | POA: Diagnosis not present

## 2013-04-23 DIAGNOSIS — Z91199 Patient's noncompliance with other medical treatment and regimen due to unspecified reason: Secondary | ICD-10-CM | POA: Insufficient documentation

## 2013-04-23 DIAGNOSIS — F329 Major depressive disorder, single episode, unspecified: Secondary | ICD-10-CM | POA: Diagnosis not present

## 2013-04-23 LAB — BASIC METABOLIC PANEL
BUN: 11 mg/dL (ref 6–23)
CO2: 25 mEq/L (ref 19–32)
Calcium: 10.2 mg/dL (ref 8.4–10.5)
Chloride: 99 mEq/L (ref 96–112)
Creatinine, Ser: 0.86 mg/dL (ref 0.50–1.10)
GFR calc Af Amer: 78 mL/min — ABNORMAL LOW (ref >90)
GFR calc non Af Amer: 67 mL/min — ABNORMAL LOW (ref >90)
Glucose, Bld: 103 mg/dL — ABNORMAL HIGH (ref 70–99)
Potassium: 3.5 mEq/L (ref 3.5–5.1)
Sodium: 137 mEq/L (ref 135–145)

## 2013-04-23 NOTE — ED Notes (Signed)
Pt alert & oriented x4, stable gait. Patient given discharge instructions, paperwork & prescription(s). Patient  instructed to stop at the registration desk to finish any additional paperwork. Patient verbalized understanding. Pt left department w/ no further questions. 

## 2013-04-23 NOTE — ED Notes (Signed)
Nauseated, pain in left lower quadrant, family member states she has been evaluated several times for the same problem and nothing resolved, family member states she is not eating any food and frustrated because she is not improving

## 2013-04-23 NOTE — ED Provider Notes (Signed)
CSN: [id number]     Arrival date & time 04/23/13  1714 History   First MD Initiated Contact with Patient 04/23/13 1849     Chief Complaint  Patient presents with  . Abdominal Pain   (Consider location/radiation/quality/duration/timing/severity/associated sxs/prior Treatment) Patient is a 72 y.o. female presenting with abdominal pain. The history is provided by the patient.  Abdominal Pain Associated symptoms: nausea and vomiting   Associated symptoms: no chest pain, no diarrhea, no dysuria, no fever and no shortness of breath    patient primary care Dr. Dannette Barbara. Patient has been seen a times this month for the same complaint of abdominal pain. Last seen October 21. At the last visit lateral negative except for a borderline low potassium. I saw the patient for the same complaint on October 15. Patient states that she has abdominal pain associated with nausea and vomiting states that is normally left lower quadrant but today stating that it's kind of all over. This seems to be a chronic abdominal pain pattern. Patient's been referred multiple times back to her primary care doctor and to GI medicine but has not followed up. Patient has not seen a primary care doctor all this month. Patient states she's not eating well she doesn't feel like eating. Prior workups have been unable to determine the cause of the pain.  Past Medical History  Diagnosis Date  . Hypertension   . High cholesterol   . Chronic back pain   . Anxiety   . Shortness of breath      even walking causes sob...   . Arthritis     in back .   Marland Kitchen Pneumonia   . Borderline diabetes   . Chronic abdominal pain   . Chronic constipation   . Chronic nausea   . Noncompliance with medication regimen   . GERD (gastroesophageal reflux disease)    Past Surgical History  Procedure Laterality Date  . Abdominal hysterectomy    . Toe surgery    . Dilation and curettage of uterus    . Breast surgery  unknown (many yrs)    excision  of  lump right breast  was per pt "nothing"   . Cardiac catheterization    . Colonoscopy  04/16/2007    RMR: Normal rectum and colon  . Colonoscopy with esophagogastroduodenoscopy (egd) N/A 01/08/2013    FAO:ZHYQ erosive reflux esophagitis. Negative H.pylori. Small hiatal hernia-s/p bx-TCS/Colonic polyp-hyperplastic   Family History  Problem Relation Age of Onset  . Colon cancer Neg Hx    History  Substance Use Topics  . Smoking status: Never Smoker   . Smokeless tobacco: Not on file  . Alcohol Use: No   OB History   Grav Para Term Preterm Abortions TAB SAB Ect Mult Living                 Review of Systems  Constitutional: Positive for appetite change. Negative for fever.  HENT: Negative for congestion.   Eyes: Negative for redness.  Respiratory: Negative for shortness of breath.   Cardiovascular: Negative for chest pain.  Gastrointestinal: Positive for nausea, vomiting and abdominal pain. Negative for diarrhea.  Genitourinary: Negative for dysuria.  Musculoskeletal: Negative for back pain.  Skin: Negative for rash.  Hematological: Does not bruise/bleed easily.  Psychiatric/Behavioral: Negative for confusion.    Allergies  Review of patient's allergies indicates no known allergies.  Home Medications   Current Outpatient Rx  Name  Route  Sig  Dispense  Refill  . amoxicillin (  AMOXIL) 500 MG capsule   Oral   Take 1,000 mg by mouth 2 (two) times daily. 10 day course starting on 04/12/2013         . clarithromycin (BIAXIN) 500 MG tablet   Oral   Take 500 mg by mouth 2 (two) times daily. 10 day course starting on 04/12/2013         . famotidine (PEPCID) 20 MG tablet   Oral   Take 20 mg by mouth daily.         Marland Kitchen gabapentin (NEURONTIN) 100 MG capsule   Oral   Take 100 mg by mouth 3 (three) times daily as needed (Back Pain).         Marland Kitchen HYDROcodone-acetaminophen (NORCO/VICODIN) 5-325 MG per tablet   Oral   Take 1 tablet by mouth every 4 (four) hours as needed for  pain.   15 tablet   0   . ibuprofen (ADVIL,MOTRIN) 200 MG tablet   Oral   Take 200 mg by mouth every 6 (six) hours as needed for pain.         Marland Kitchen olmesartan (BENICAR) 40 MG tablet   Oral   Take 40 mg by mouth daily.         Marland Kitchen omeprazole (PRILOSEC) 20 MG capsule   Oral   Take 20 mg by mouth daily. For acid reflux         . ondansetron (ZOFRAN-ODT) 4 MG disintegrating tablet   Oral   Take 4 mg by mouth every 8 (eight) hours as needed for nausea.          BP 144/78  Pulse 100  Temp(Src) 99.1 F (37.3 C) (Oral)  Resp 18  SpO2 100% Physical Exam  Nursing note and vitals reviewed. Constitutional: She is oriented to person, place, and time. She appears well-developed and well-nourished. No distress.  HENT:  Head: Normocephalic and atraumatic.  Mouth/Throat: Oropharynx is clear and moist.  Eyes: Conjunctivae and EOM are normal. Pupils are equal, round, and reactive to light. No scleral icterus.  Neck: Normal range of motion.  Cardiovascular: Normal rate, regular rhythm and normal heart sounds.   No murmur heard. Pulmonary/Chest: Effort normal and breath sounds normal. No respiratory distress.  Abdominal: Soft. Bowel sounds are normal. There is no tenderness.  Neurological: She is alert and oriented to person, place, and time. No cranial nerve deficit. She exhibits normal muscle tone. Coordination normal.  Skin: Skin is warm. No rash noted.    ED Course  Procedures (including critical care time) Labs Review Labs Reviewed  BASIC METABOLIC PANEL - Abnormal; Notable for the following:    Glucose, Bld 103 (*)    GFR calc non Af Amer 67 (*)    GFR calc Af Amer 78 (*)    All other components within normal limits   Results for orders placed during the hospital encounter of 04/23/13  BASIC METABOLIC PANEL      Result Value Range   Sodium 137  135 - 145 mEq/L   Potassium 3.5  3.5 - 5.1 mEq/L   Chloride 99  96 - 112 mEq/L   CO2 25  19 - 32 mEq/L   Glucose, Bld 103 (*)  70 - 99 mg/dL   BUN 11  6 - 23 mg/dL   Creatinine, Ser 1.91  0.50 - 1.10 mg/dL   Calcium 47.8  8.4 - 29.5 mg/dL   GFR calc non Af Amer 67 (*) >90 mL/min   GFR calc Af Denyse Dago  78 (*) >90 mL/min    Imaging Review No results found.  EKG Interpretation   None       MDM   1. Chronic abdominal pain    This is the patient's eighth visit this month for the same pain. This is a chronic abdominal pain pattern. Patient's had multiple labs x-rays and CT scan over the past 2 months. Today check a basic metabolic panel because her potassium was slightly low 2 days ago last visit was 10/21. Patient's abdomen without any acute findings. Patient we discharged back to the care of her primary care doctor a consideration as stated before for GI medicine consultation or [facility] to evaluate. Patient is nontoxic no acute distress.    Shelda Jakes, MD 04/23/13 2100

## 2013-04-23 NOTE — ED Notes (Signed)
Pt states she is taking her medications & still hurting. Family thinks it might be something mental. Pt states she wants to stay to see what is wrong. Pt unable to say way she does not want to eat says her stomach hurts.

## 2013-04-24 ENCOUNTER — Emergency Department (HOSPITAL_COMMUNITY): Payer: Medicare Other

## 2013-04-24 ENCOUNTER — Telehealth: Payer: Self-pay | Admitting: Internal Medicine

## 2013-04-24 ENCOUNTER — Emergency Department (HOSPITAL_COMMUNITY)
Admission: EM | Admit: 2013-04-24 | Discharge: 2013-04-25 | Disposition: A | Discharge status: Other Acute Inpatient Hospital | Payer: Medicare Other | Attending: Emergency Medicine | Admitting: Emergency Medicine

## 2013-04-24 ENCOUNTER — Ambulatory Visit (INDEPENDENT_AMBULATORY_CARE_PROVIDER_SITE_OTHER): Payer: Medicare Other | Admitting: Psychiatry

## 2013-04-24 ENCOUNTER — Encounter (HOSPITAL_COMMUNITY): Payer: Self-pay | Admitting: Emergency Medicine

## 2013-04-24 ENCOUNTER — Encounter (HOSPITAL_COMMUNITY): Payer: Self-pay | Admitting: Psychiatry

## 2013-04-24 VITALS — BP 100/80 | Ht 64.0 in | Wt 150.0 lb

## 2013-04-24 DIAGNOSIS — Z90710 Acquired absence of both cervix and uterus: Secondary | ICD-10-CM | POA: Insufficient documentation

## 2013-04-24 DIAGNOSIS — R109 Unspecified abdominal pain: Secondary | ICD-10-CM

## 2013-04-24 DIAGNOSIS — Z91199 Patient's noncompliance with other medical treatment and regimen due to unspecified reason: Secondary | ICD-10-CM | POA: Insufficient documentation

## 2013-04-24 DIAGNOSIS — R509 Fever, unspecified: Secondary | ICD-10-CM | POA: Insufficient documentation

## 2013-04-24 DIAGNOSIS — F329 Major depressive disorder, single episode, unspecified: Secondary | ICD-10-CM | POA: Insufficient documentation

## 2013-04-24 DIAGNOSIS — F323 Major depressive disorder, single episode, severe with psychotic features: Secondary | ICD-10-CM

## 2013-04-24 DIAGNOSIS — Z9889 Other specified postprocedural states: Secondary | ICD-10-CM | POA: Insufficient documentation

## 2013-04-24 DIAGNOSIS — Z9119 Patient's noncompliance with other medical treatment and regimen: Secondary | ICD-10-CM | POA: Insufficient documentation

## 2013-04-24 DIAGNOSIS — F459 Somatoform disorder, unspecified: Secondary | ICD-10-CM

## 2013-04-24 DIAGNOSIS — F1310 Sedative, hypnotic or anxiolytic abuse, uncomplicated: Secondary | ICD-10-CM | POA: Insufficient documentation

## 2013-04-24 DIAGNOSIS — Z8639 Personal history of other endocrine, nutritional and metabolic disease: Secondary | ICD-10-CM | POA: Insufficient documentation

## 2013-04-24 DIAGNOSIS — Z8701 Personal history of pneumonia (recurrent): Secondary | ICD-10-CM | POA: Insufficient documentation

## 2013-04-24 DIAGNOSIS — R112 Nausea with vomiting, unspecified: Secondary | ICD-10-CM | POA: Insufficient documentation

## 2013-04-24 DIAGNOSIS — Z79899 Other long term (current) drug therapy: Secondary | ICD-10-CM | POA: Insufficient documentation

## 2013-04-24 DIAGNOSIS — R45851 Suicidal ideations: Secondary | ICD-10-CM | POA: Insufficient documentation

## 2013-04-24 DIAGNOSIS — E7800 Pure hypercholesterolemia, unspecified: Secondary | ICD-10-CM | POA: Insufficient documentation

## 2013-04-24 DIAGNOSIS — F32A Depression, unspecified: Secondary | ICD-10-CM

## 2013-04-24 DIAGNOSIS — Z862 Personal history of diseases of the blood and blood-forming organs and certain disorders involving the immune mechanism: Secondary | ICD-10-CM | POA: Insufficient documentation

## 2013-04-24 DIAGNOSIS — K219 Gastro-esophageal reflux disease without esophagitis: Secondary | ICD-10-CM | POA: Insufficient documentation

## 2013-04-24 DIAGNOSIS — R1084 Generalized abdominal pain: Secondary | ICD-10-CM | POA: Insufficient documentation

## 2013-04-24 DIAGNOSIS — G8929 Other chronic pain: Secondary | ICD-10-CM | POA: Insufficient documentation

## 2013-04-24 DIAGNOSIS — I10 Essential (primary) hypertension: Secondary | ICD-10-CM | POA: Insufficient documentation

## 2013-04-24 DIAGNOSIS — R4182 Altered mental status, unspecified: Secondary | ICD-10-CM | POA: Diagnosis not present

## 2013-04-24 DIAGNOSIS — F411 Generalized anxiety disorder: Secondary | ICD-10-CM | POA: Insufficient documentation

## 2013-04-24 DIAGNOSIS — F1110 Opioid abuse, uncomplicated: Secondary | ICD-10-CM | POA: Insufficient documentation

## 2013-04-24 DIAGNOSIS — F3289 Other specified depressive episodes: Secondary | ICD-10-CM | POA: Insufficient documentation

## 2013-04-24 LAB — URINALYSIS, ROUTINE W REFLEX MICROSCOPIC
Glucose, UA: NEGATIVE mg/dL
Ketones, ur: 40 mg/dL — AB
Leukocytes, UA: NEGATIVE
Nitrite: NEGATIVE
Specific Gravity, Urine: >1.03 — ABNORMAL HIGH (ref 1.005–1.030)
Urobilinogen, UA: 0.2 mg/dL (ref 0.0–1.0)
pH: 6 (ref 5.0–8.0)

## 2013-04-24 LAB — BASIC METABOLIC PANEL
BUN: 12 mg/dL (ref 6–23)
CO2: 22 mEq/L (ref 19–32)
Calcium: 10 mg/dL (ref 8.4–10.5)
Chloride: 102 mEq/L (ref 96–112)
Creatinine, Ser: 0.87 mg/dL (ref 0.50–1.10)
GFR calc Af Amer: 76 mL/min — ABNORMAL LOW (ref >90)
GFR calc non Af Amer: 66 mL/min — ABNORMAL LOW (ref >90)
Glucose, Bld: 115 mg/dL — ABNORMAL HIGH (ref 70–99)
Potassium: 3.1 mEq/L — ABNORMAL LOW (ref 3.5–5.1)
Sodium: 139 mEq/L (ref 135–145)

## 2013-04-24 LAB — CBC WITH DIFFERENTIAL/PLATELET
Basophils Absolute: 0 10*3/uL (ref 0.0–0.1)
Basophils Relative: 1 % (ref 0–1)
Eosinophils Absolute: 0 10*3/uL (ref 0.0–0.7)
Eosinophils Relative: 0 % (ref 0–5)
HCT: 37.7 % (ref 36.0–46.0)
Hemoglobin: 13 g/dL (ref 12.0–15.0)
Lymphocytes Relative: 22 % (ref 12–46)
Lymphs Abs: 1.3 10*3/uL (ref 0.7–4.0)
MCH: 30.6 pg (ref 26.0–34.0)
MCHC: 34.5 g/dL (ref 30.0–36.0)
MCV: 88.7 fL (ref 78.0–100.0)
Monocytes Absolute: 0.7 10*3/uL (ref 0.1–1.0)
Monocytes Relative: 12 % (ref 3–12)
Neutro Abs: 3.9 10*3/uL (ref 1.7–7.7)
Neutrophils Relative %: 66 % (ref 43–77)
Platelets: 516 10*3/uL — ABNORMAL HIGH (ref 150–400)
RBC: 4.25 MIL/uL (ref 3.87–5.11)
RDW: 12.8 % (ref 11.5–15.5)
WBC: 5.9 10*3/uL (ref 4.0–10.5)

## 2013-04-24 LAB — ETHANOL: Alcohol, Ethyl (B): <11 mg/dL (ref 0–11)

## 2013-04-24 LAB — RAPID URINE DRUG SCREEN, HOSP PERFORMED
Amphetamines: NOT DETECTED
Barbiturates: POSITIVE — AB
Benzodiazepines: NOT DETECTED
Cocaine: NOT DETECTED
Opiates: POSITIVE — AB
Tetrahydrocannabinol: NOT DETECTED

## 2013-04-24 LAB — URINE MICROSCOPIC-ADD ON

## 2013-04-24 MED ORDER — DICYCLOMINE HCL 10 MG PO CAPS
10.0000 mg | ORAL_CAPSULE | Freq: Once | ORAL | Status: AC
  Administered 2013-04-24: 10 mg via ORAL
  Filled 2013-04-24: qty 1

## 2013-04-24 MED ORDER — ONDANSETRON HCL 4 MG PO TABS
4.0000 mg | ORAL_TABLET | Freq: Three times a day (TID) | ORAL | Status: DC | PRN
  Administered 2013-04-24 (×2): 4 mg via ORAL
  Filled 2013-04-24 (×2): qty 1

## 2013-04-24 MED ORDER — IBUPROFEN 400 MG PO TABS: 600.0000 mg | ORAL_TABLET | Freq: Three times a day (TID) | ORAL | Status: DC | PRN

## 2013-04-24 MED ORDER — SIMVASTATIN 10 MG PO TABS
10.0000 mg | ORAL_TABLET | Freq: Every day | ORAL | Status: DC
  Administered 2013-04-24: 10 mg via ORAL
  Filled 2013-04-24: qty 1

## 2013-04-24 MED ORDER — ALUM & MAG HYDROXIDE-SIMETH 200-200-20 MG/5ML PO SUSP: 30.0000 mL | ORAL | Status: DC | PRN

## 2013-04-24 MED ORDER — GABAPENTIN 100 MG PO CAPS
100.0000 mg | ORAL_CAPSULE | Freq: Three times a day (TID) | ORAL | Status: DC | PRN
  Administered 2013-04-24: 100 mg via ORAL
  Filled 2013-04-24: qty 1

## 2013-04-24 MED ORDER — NICOTINE 21 MG/24HR TD PT24: 21.0000 mg | MEDICATED_PATCH | Freq: Every day | TRANSDERMAL | Status: DC | PRN

## 2013-04-24 MED ORDER — IRBESARTAN 300 MG PO TABS
300.0000 mg | ORAL_TABLET | Freq: Every day | ORAL | Status: DC
  Administered 2013-04-25: 300 mg via ORAL
  Filled 2013-04-24 (×2): qty 1

## 2013-04-24 MED ORDER — ZOLPIDEM TARTRATE 5 MG PO TABS
5.0000 mg | ORAL_TABLET | Freq: Every evening | ORAL | Status: DC | PRN
  Administered 2013-04-25: 5 mg via ORAL
  Filled 2013-04-24: qty 1

## 2013-04-24 MED ORDER — PANTOPRAZOLE SODIUM 40 MG PO TBEC
40.0000 mg | DELAYED_RELEASE_TABLET | Freq: Every day | ORAL | Status: DC
  Administered 2013-04-25: 40 mg via ORAL
  Filled 2013-04-24: qty 1

## 2013-04-24 MED ORDER — ACETAMINOPHEN 325 MG PO TABS
650.0000 mg | ORAL_TABLET | ORAL | Status: DC | PRN
  Administered 2013-04-24: 650 mg via ORAL
  Filled 2013-04-24: qty 2

## 2013-04-24 NOTE — ED Notes (Addendum)
Called  [facility] and nurse Shaquitha didn't know anything about this patient. Nurse called back after speaking with Maurine Minister who was working on pt earlier and Maurine Minister told Shaquithia that he called [facility] and told them that pt was denied. Charge nurse states we will wait until the next facility calls for placement or denial.

## 2013-04-24 NOTE — Progress Notes (Signed)
Received a phone call from [facility] [phone]), per Jill Alexanders, Pt. Has been declined due to medical problems. - Rodman Pickle, MHT

## 2013-04-24 NOTE — ED Notes (Signed)
Pt's family left contact numbers. Mateo Flow [phone], Jackelyn Hoehn [phone]. Pt speaking with [facility] PA on Telepsych at this time.

## 2013-04-24 NOTE — Progress Notes (Signed)
Psychiatric Assessment Adult  Patient Identification:  Audrey Johnson Date of Evaluation:  04/24/2013 Chief Complaint: "Her stomach hurts all the time and no one can find anything wrong" History of Chief Complaint:   Chief Complaint  Patient presents with  . Altered Mental Status  . Anxiety  . Depression  . Establish Care    Altered Mental Status Associated symptoms include abdominal pain and nausea.  Anxiety Symptoms include confusion, nausea, nervous/anxious behavior and suicidal ideas.     this patient is a 72 year old widowed black female who lives with her son in [place]. She's accompanied by her sister Sallye Ober and her son-in-law Duke Salvia. I also spoke on the phone to her daughter Jasmine December. The patient used to work in a [employer] but has been retired for several years.  The patient was referred by Dr. Margo Aye, her primary Dr. Apparently she's not had any history of mental health symptoms until about 2 months ago. She started to have severe stomach pain and has gone to several emergency room this in the local area. She's had 9 emergency room visits in the last couple of months. Her last one was yesterday. She's had 3 abdominal CTs in the last month, and ultrasound and blood work. Nothing significantly wrong has been found.  The patient is on numerous medications for her stomach but they don't seem to help. She was prescribed Lexapro by her primary doctor but she refused to take it. Chest she is taking a low dose of Xanax.  According to her family the patient has become more distraught and unreachable. She's constantly grabbing her stomach and complaining of pain. She claims that she can't eat and has lost 20 pounds in the last 2 months. She's not sleeping at night and she is crying all the time. She claims that she saw a vision of an animal coming out of the woods and it was going to get her family. Of most concern is the fact that she claims she is so miserable that she's going  to walk out in front of traffic and kill herself. Review of Systems  Constitutional: Positive for appetite change and unexpected weight change.  Gastrointestinal: Positive for nausea and abdominal pain.  Psychiatric/Behavioral: Positive for suicidal ideas, hallucinations, behavioral problems, confusion and agitation. The patient is nervous/anxious.    Physical Exam not done  Depressive Symptoms: depressed mood, anhedonia, insomnia, psychomotor agitation, difficulty concentrating, hopelessness, suicidal thoughts with specific plan, anxiety, loss of energy/fatigue, weight loss,  (Hypo) Manic Symptoms:   Elevated Mood:  No Irritable Mood:  Yes Grandiosity:  No Distractibility:  No Labiality of Mood:  Yes Delusions:  Yes Hallucinations:  Yes Impulsivity:  No Sexually Inappropriate Behavior:  No Financial Extravagance:  No Flight of Ideas:  No  Anxiety Symptoms: Excessive Worry:  Yes Panic Symptoms:  No Agoraphobia:  No Obsessive Compulsive: No  Symptoms: None, Specific Phobias:  No Social Anxiety:  No  Psychotic Symptoms:  Hallucinations: Yes Visual Delusions:  Yes Paranoia:  No   Ideas of Reference:  No  PTSD Symptoms: Ever had a traumatic exposure:  No Had a traumatic exposure in the last month:  No Re-experiencing: No None Hypervigilance:  No Hyperarousal: No None Avoidance: No None  Traumatic Brain Injury: No   Past Psychiatric History: Diagnosis: None   Hospitalizations: None   Outpatient Care: None   Substance Abuse Care: None   Self-Mutilation: None   Suicidal Attempts: None   Violent Behaviors: None    Past Medical History:  Past Medical History  Diagnosis Date  . Hypertension   . High cholesterol   . Chronic back pain   . Anxiety   . Shortness of breath      even walking causes sob...   . Arthritis     in back .   Marland Kitchen Pneumonia   . Borderline diabetes   . Chronic abdominal pain   . Chronic constipation   . Chronic nausea   .  Noncompliance with medication regimen   . GERD (gastroesophageal reflux disease)    History of Loss of Consciousness:  No Seizure History:  No Cardiac History:  No Allergies:  No Known Allergies Current Medications:  No current facility-administered medications for this visit.   Current Outpatient Prescriptions  Medication Sig Dispense Refill  . gabapentin (NEURONTIN) 100 MG capsule Take 100 mg by mouth 3 (three) times daily as needed (Back Pain).      Marland Kitchen HYDROcodone-acetaminophen (NORCO/VICODIN) 5-325 MG per tablet Take 1 tablet by mouth every 4 (four) hours as needed for pain.  15 tablet  0  . ibuprofen (ADVIL,MOTRIN) 200 MG tablet Take 200 mg by mouth every 6 (six) hours as needed for pain.      Marland Kitchen olmesartan (BENICAR) 40 MG tablet Take 40 mg by mouth daily.      Marland Kitchen omeprazole (PRILOSEC) 20 MG capsule Take 20 mg by mouth daily. For acid reflux      . ondansetron (ZOFRAN-ODT) 4 MG disintegrating tablet Take 4 mg by mouth every 8 (eight) hours as needed for nausea.      . ALPRAZolam (XANAX) 0.25 MG tablet       . amoxicillin (AMOXIL) 500 MG capsule Take 1,000 mg by mouth 2 (two) times daily. 10 day course starting on 04/12/2013      . clarithromycin (BIAXIN) 500 MG tablet Take 500 mg by mouth 2 (two) times daily. 10 day course starting on 04/12/2013      . famotidine (PEPCID) 20 MG tablet Take 20 mg by mouth daily.      . pravastatin (PRAVACHOL) 20 MG tablet        Facility-Administered Medications Ordered in Other Visits  Medication Dose Route Frequency Provider Last Rate Last Dose  . lactated ringers infusion    Continuous PRN Turner Daniels, CRNA        Previous Psychotropic Medications:  Medication Dose   Xanax   0.25 mg every 8 hours as needed                      Substance Abuse History in the last 12 months: Substance Age of 1st Use Last Use Amount Specific Type  Nicotine      Alcohol      Cannabis      Opiates      Cocaine      Methamphetamines      LSD       Ecstasy      Benzodiazepines      Caffeine      Inhalants      Others:                          Medical Consequences of Substance Abuse: n/a  Legal Consequences of Substance Abuse: n/a  Family Consequences of Substance Abuse: n/a  Blackouts:  No DT's:  No Withdrawal Symptoms:  No None  Social History: Current Place of Residence: [place] [address] of Birth: Unknown Family Members:  Lives with son Marital Status:  Widowed Children:   Sons: 1  Daughters: 1 Relationships:  Education:  [employer] Problems/Performance:  Religious Beliefs/Practices: Unknown History of Abuse: none Armed forces technical officer; Stage manager History:  None. Legal History: None Hobbies/Interests: Church  Family History:   Family History  Problem Relation Age of Onset  . Colon cancer Neg Hx     Mental Status Examination/Evaluation: Objective:  Appearance: Disheveled black female, slender, constantly moaning and holding her stomach. At one point she asked to lie on the floor   Eye Contact::  Poor  Speech:  Slow  Volume:  Decreased  Mood:  Extremely anxious tearful and claims to be in pain   Affect:  Depressed and Labile  Thought Process:  Disorganized  Orientation:  Full (Time, Place, and Person)  Thought Content:  Hallucinations: Visual and Obsessions  Suicidal Thoughts:  Yes.  with intent/plan  Homicidal Thoughts:  No  Judgement:  Impaired  Insight:  Lacking  Psychomotor Activity:  Increased  Akathisia:  No  Handed:  Right  AIMS (if indicated):    Assets:  Social Support    Laboratory/X-Ray Psychological Evaluation(s)        Assessment:  Maj. depression, single episode with psychotic features and somatization disorder  AXIS I Major Depression, single episode with psychotic features, and somatization disorder   AXIS II Deferred  AXIS III Past Medical History  Diagnosis Date  . Hypertension   . High cholesterol   . Chronic back pain   .  Anxiety   . Shortness of breath      even walking causes sob...   . Arthritis     in back .   Marland Kitchen Pneumonia   . Borderline diabetes   . Chronic abdominal pain   . Chronic constipation   . Chronic nausea   . Noncompliance with medication regimen   . GERD (gastroesophageal reflux disease)      AXIS IV other psychosocial or environmental problems  AXIS V 11-20 some danger of hurting self or others possible OR occasionally fails to maintain minimal personal hygiene OR gross impairment in communication   Treatment Plan/Recommendations:  Plan of Care: Referral for immediate inpatient hospitalization   Laboratory:    Psychotherapy:   Medications:   Routine PRN Medications:  No  Consultations: I have consulted the triage nurse for psychiatry. The plan is to send the patient to [facility] emergency room for evaluation by psychiatry for admission to behavior health hospital. After stabilization the hospital she can return here for followup   Safety Concerns:    Other:      Diannia Ruder, MD 10/24/201410:48 AM

## 2013-04-24 NOTE — Consult Note (Signed)
Telepsych Consultation   Reason for Consult:  Evaluation of depression with SI Referring Physician:   CATELYN Johnson is an 72 y.o. female.  Assessment: AXIS I:  Mood Disorder NOS AXIS II:  Deferred AXIS III:   Past Medical History  Diagnosis Date  . Hypertension   . High cholesterol   . Chronic back pain   . Anxiety   . Shortness of breath      even walking causes sob...   . Arthritis     in back .   Marland Kitchen Pneumonia   . Borderline diabetes   . Chronic abdominal pain   . Chronic constipation   . Chronic nausea   . Noncompliance with medication regimen   . GERD (gastroesophageal reflux disease)    AXIS IV:  other psychosocial or environmental problems AXIS V:  41-50 serious symptoms  Plan:  Recommend psychiatric Inpatient admission when medically cleared. Supportive therapy provided about ongoing stressors.  Subjective:   Audrey Johnson is a 72 y.o. female patient admitted with concerns about increased depression and anxiety. Patient is very tearful that a source of her abdominal pain has not been found that she describes as a "constant ache." The patient relates being depressed only over her pain but admits to recent trouble with her memory. She states "I forget more easily. I know my daughter had said I haven't been talking right lately. But I don't really know. I have considered stepping in front of a care because of my pain. I'm just really depressed about it. I took the medicine that my MD gave me one time and it made me crazy. I'm not taking any medication again."   HPI:  Audrey Johnson is an 72 y.o. female that was assessed via tele assessment this day after being referred by her PCP, Dr. Tenny Craw (see note in [facility]). Dr. Tenny Craw called [facility], spoke with this clinician, and informed writer that pt would be sent to [facility], as she felt hospitalization was needed for the pt. Pt has been reporting abdominal pain with nausea x 2 months. No medical cause for this has been found per  medical record. Pt reports SI, stating she wants to walk out into traffic because of the pain. Pt also reported seeing and hearing ([facility]) a figure in white come out of her bushes in her yard and that it was talking, but she couldn't understand what it was saying. She did state that she thought it was going to hurt her or the family. Pt was tearful during session, spitting into a bag. Pt has lost 20 lbs in 2 mos, stating she cannot eat. Pt endorses sx of depression and anxiety. Pt was started on Lexapro and Xanax per her family, but pt did not take the Lexapro. Pt has not had any previous mental health or SA treatment by report. Pt denies SA. Pt lives with her son.   HPI Elements:   Location:  [facility] in-patient . Quality:  Increased depression . Severity:  Possible hallucinations, decrease in functioning. Timing:  Over the last two months. . Duration:  Several months. Context:  Continued complaints of stomach pain that evaluations of have come back negative.  Past Psychiatric History: Past Medical History  Diagnosis Date  . Hypertension   . High cholesterol   . Chronic back pain   . Anxiety   . Shortness of breath      even walking causes sob...   . Arthritis     in back .   Marland Kitchen  Pneumonia   . Borderline diabetes   . Chronic abdominal pain   . Chronic constipation   . Chronic nausea   . Noncompliance with medication regimen   . GERD (gastroesophageal reflux disease)     reports that she has never smoked. She does not have any smokeless tobacco history on file. She reports that she does not drink alcohol or use illicit drugs. Family History  Problem Relation Age of Onset  . Colon cancer Neg Hx    Family History Substance Abuse: No Family Supports: Yes, List: (daughter, son, niece) Living Arrangements: Other relatives Can pt return to current living arrangement?: Yes Allergies:  No Known Allergies  ACT Assessment Complete:  Yes:    Educational Status    Risk to Self: Risk to  self Suicidal Ideation: Yes-Currently Present Suicidal Intent: Yes-Currently Present Is patient at risk for suicide?: Yes Suicidal Plan?: Yes-Currently Present Specify Current Suicidal Plan: to walk out into traffic Access to Means: Yes Specify Access to Suicidal Means: can walk out into traffic What has been your use of drugs/alcohol within the last 12 months?: pt denies Previous Attempts/Gestures: No How many times?: 0 Other Self Harm Risks: pt denies Triggers for Past Attempts: None known Intentional Self Injurious Behavior: None Family Suicide History: No Recent stressful life event(s): Other (Comment) (reports abdomincal pain and nausea, SI, psychosis) Persecutory voices/beliefs?: No Depression: Yes Depression Symptoms: Despondent;Insomnia;Tearfulness;Fatigue;Feeling worthless/self pity Substance abuse history and/or treatment for substance abuse?: No Suicide prevention information given to non-admitted patients: Not applicable  Risk to Others: Risk to Others Homicidal Ideation: No Thoughts of Harm to Others: No Current Homicidal Intent: No Current Homicidal Plan: No Access to Homicidal Means: No Identified Victim: pt denies History of harm to others?: No Assessment of Violence: None Noted Violent Behavior Description: na - pt calm, cooperative Criminal Charges Pending?: No Does patient have a court date: No  Abuse: Abuse/Neglect Assessment (Assessment to be complete while patient is alone) Physical Abuse: Denies Verbal Abuse: Denies Sexual Abuse: Denies Exploitation of patient/patient's resources: Denies Self-Neglect: Denies  Prior Inpatient Therapy: Prior Inpatient Therapy Prior Inpatient Therapy: No Prior Therapy Dates: na Prior Therapy Facilty/Provider(s): na Reason for Treatment: na  Prior Outpatient Therapy: Prior Outpatient Therapy Prior Outpatient Therapy: No Prior Therapy Dates: na Prior Therapy Facilty/Provider(s): na Reason for Treatment: na   Additional Information: Additional Information 1:1 In Past 12 Months?: No CIRT Risk: No Elopement Risk: No Does patient have medical clearance?: Yes                  Objective: Blood pressure 149/78, pulse 104, temperature 99 F (37.2 C), temperature source Oral, resp. rate 18, height 5\' 4"  (1.626 m), weight 68.04 kg (150 lb), SpO2 100.00%.Body mass index is 25.73 kg/(m^2). Results for orders placed during the hospital encounter of 04/24/13 (from the past 72 hour(s))  URINALYSIS, ROUTINE W REFLEX MICROSCOPIC     Status: Abnormal   Collection Time    04/24/13 11:28 AM      Result Value Range   Color, Urine AMBER (*) YELLOW   Comment: BIOCHEMICALS MAY BE AFFECTED BY COLOR   APPearance CLEAR  CLEAR   Specific Gravity, Urine >1.030 (*) 1.005 - 1.030   pH 6.0  5.0 - 8.0   Glucose, UA NEGATIVE  NEGATIVE mg/dL   Hgb urine dipstick TRACE (*) NEGATIVE   Bilirubin Urine SMALL (*) NEGATIVE   Ketones, ur 40 (*) NEGATIVE mg/dL   Protein, ur TRACE (*) NEGATIVE mg/dL   Urobilinogen, UA  0.2  0.0 - 1.0 mg/dL   Nitrite NEGATIVE  NEGATIVE   Leukocytes, UA NEGATIVE  NEGATIVE  URINE RAPID DRUG SCREEN (HOSP PERFORMED)     Status: Abnormal   Collection Time    04/24/13 11:28 AM      Result Value Range   Opiates POSITIVE (*) NONE DETECTED   Cocaine NONE DETECTED  NONE DETECTED   Benzodiazepines NONE DETECTED  NONE DETECTED   Amphetamines NONE DETECTED  NONE DETECTED   Tetrahydrocannabinol NONE DETECTED  NONE DETECTED   Barbiturates POSITIVE (*) NONE DETECTED   Comment:            DRUG SCREEN FOR MEDICAL PURPOSES     ONLY.  IF CONFIRMATION IS NEEDED     FOR ANY PURPOSE, NOTIFY LAB     WITHIN 5 DAYS.                LOWEST DETECTABLE LIMITS     FOR URINE DRUG SCREEN     Drug Class       Cutoff (ng/mL)     Amphetamine      1000     Barbiturate      200     Benzodiazepine   200     Tricyclics       300     Opiates          300     Cocaine          300     THC              50   URINE MICROSCOPIC-ADD ON     Status: None   Collection Time    04/24/13 11:28 AM      Result Value Range   RBC / HPF 3-6  <3 RBC/hpf  CBC WITH DIFFERENTIAL     Status: Abnormal   Collection Time    04/24/13 12:05 PM      Result Value Range   WBC 5.9  4.0 - 10.5 K/uL   RBC 4.25  3.87 - 5.11 MIL/uL   Hemoglobin 13.0  12.0 - 15.0 g/dL   HCT 40.9  81.1 - 91.4 %   MCV 88.7  78.0 - 100.0 fL   MCH 30.6  26.0 - 34.0 pg   MCHC 34.5  30.0 - 36.0 g/dL   RDW 78.2  95.6 - 21.3 %   Platelets 516 (*) 150 - 400 K/uL   Neutrophils Relative % 66  43 - 77 %   Neutro Abs 3.9  1.7 - 7.7 K/uL   Lymphocytes Relative 22  12 - 46 %   Lymphs Abs 1.3  0.7 - 4.0 K/uL   Monocytes Relative 12  3 - 12 %   Monocytes Absolute 0.7  0.1 - 1.0 K/uL   Eosinophils Relative 0  0 - 5 %   Eosinophils Absolute 0.0  0.0 - 0.7 K/uL   Basophils Relative 1  0 - 1 %   Basophils Absolute 0.0  0.0 - 0.1 K/uL  BASIC METABOLIC PANEL     Status: Abnormal   Collection Time    04/24/13 12:05 PM      Result Value Range   Sodium 139  135 - 145 mEq/L   Potassium 3.1 (*) 3.5 - 5.1 mEq/L   Chloride 102  96 - 112 mEq/L   CO2 22  19 - 32 mEq/L   Glucose, Bld 115 (*) 70 - 99 mg/dL   BUN 12  6 -  23 mg/dL   Creatinine, Ser 4.09  0.50 - 1.10 mg/dL   Calcium 81.1  8.4 - 91.4 mg/dL   GFR calc non Af Amer 66 (*) >90 mL/min   GFR calc Af Amer 76 (*) >90 mL/min   Comment: (NOTE)     The eGFR has been calculated using the CKD EPI equation.     This calculation has not been validated in all clinical situations.     eGFR's persistently <90 mL/min signify possible Chronic Kidney     Disease.  ETHANOL     Status: None   Collection Time    04/24/13 12:05 PM      Result Value Range   Alcohol, Ethyl (B) <11  0 - 11 mg/dL   Comment:            LOWEST DETECTABLE LIMIT FOR     SERUM ALCOHOL IS 11 mg/dL     FOR MEDICAL PURPOSES ONLY   Labs are reviewed and are pertinent for .  Current Facility-Administered Medications  Medication Dose  Route Frequency Provider Last Rate Last Dose  . acetaminophen (TYLENOL) tablet 650 mg  650 mg Oral Q4H PRN Flint Melter, MD   650 mg at 04/24/13 1433  . alum & mag hydroxide-simeth (MAALOX/MYLANTA) 200-200-20 MG/5ML suspension 30 mL  30 mL Oral PRN Flint Melter, MD      . gabapentin (NEURONTIN) capsule 100 mg  100 mg Oral TID PRN Flint Melter, MD      . ibuprofen (ADVIL,MOTRIN) tablet 600 mg  600 mg Oral Q8H PRN Flint Melter, MD      . irbesartan (AVAPRO) tablet 300 mg  300 mg Oral Daily Flint Melter, MD      . nicotine (NICODERM CQ - dosed in mg/24 hours) patch 21 mg  21 mg Transdermal Daily PRN Flint Melter, MD      . ondansetron [facility]) tablet 4 mg  4 mg Oral Q8H PRN Flint Melter, MD   4 mg at 04/24/13 1433  . pantoprazole (PROTONIX) EC tablet 40 mg  40 mg Oral Daily Flint Melter, MD      . simvastatin (ZOCOR) tablet 10 mg  10 mg Oral q1800 Flint Melter, MD      . zolpidem (AMBIEN) tablet 5 mg  5 mg Oral QHS PRN Flint Melter, MD       Current Outpatient Prescriptions  Medication Sig Dispense Refill  . amoxicillin (AMOXIL) 500 MG capsule Take 1,000 mg by mouth 2 (two) times daily. 10 day course starting on 04/12/2013      . clarithromycin (BIAXIN) 500 MG tablet Take 500 mg by mouth 2 (two) times daily. 10 day course starting on 04/12/2013      . escitalopram (LEXAPRO) 10 MG tablet Take 10 mg by mouth daily.      Marland Kitchen HYDROcodone-acetaminophen (NORCO/VICODIN) 5-325 MG per tablet Take 1 tablet by mouth every 4 (four) hours as needed for pain.  15 tablet  0  . ibuprofen (ADVIL,MOTRIN) 200 MG tablet Take 200 mg by mouth every 6 (six) hours as needed for pain.      Marland Kitchen olmesartan (BENICAR) 40 MG tablet Take 40 mg by mouth daily.      Marland Kitchen omeprazole (PRILOSEC) 20 MG capsule Take 20 mg by mouth daily. For acid reflux      . ALPRAZolam (XANAX) 0.25 MG tablet Take 0.25 mg by mouth at bedtime as needed.       Marland Kitchen  famotidine (PEPCID) 20 MG tablet Take 20 mg by mouth daily.      Marland Kitchen  gabapentin (NEURONTIN) 100 MG capsule Take 100 mg by mouth 3 (three) times daily as needed (Back Pain).      . ondansetron (ZOFRAN-ODT) 4 MG disintegrating tablet Take 4 mg by mouth every 8 (eight) hours as needed for nausea.       Facility-Administered Medications Ordered in Other Encounters  Medication Dose Route Frequency Provider Last Rate Last Dose  . lactated ringers infusion    Continuous PRN Turner Daniels, CRNA        Psychiatric Specialty Exam:     Blood pressure 149/78, pulse 104, temperature 99 F (37.2 C), temperature source Oral, resp. rate 18, height 5\' 4"  (1.626 m), weight 68.04 kg (150 lb), SpO2 100.00%.Body mass index is 25.73 kg/(m^2).  General Appearance: Casual  Eye Contact::  Good  Speech:  Clear and Coherent  Volume:  Normal  Mood:  Depressed and Dysphoric  Affect:  Flat  Thought Process:  Intact  Orientation:  Full (Time, Place, and Person)  Thought Content:  Rumination  Suicidal Thoughts:  Yes.  with intent/plan  Homicidal Thoughts:  No  Memory:  Immediate;   Fair Recent;   Fair Remote;   Fair  Judgement:  Impaired  Insight:  Fair  Psychomotor Activity:  Decreased  Concentration:  Good  Recall:  Fair  Akathisia:  No  Handed:  Right  AIMS (if indicated):     Assets:  Communication Skills Desire for Improvement Housing Intimacy Leisure Time Resilience  Sleep:      Treatment Plan Summary: Discussed case with Dr. Dub Mikes. Recommend inpatient hospitalization at [facility] or other gero-psychiatric facility for further evaluation of depression with suicidal thoughts.   Disposition: Disposition Initial Assessment Completed for this Encounter: Yes Disposition of Patient: Referred to;Inpatient treatment program Type of inpatient treatment program: Adult Patient referred to: Other (Comment)  DAVIS, LAURA NP-C 04/24/2013 4:01 PM  Agree with assessment and plan Madie Reno A. Dub Mikes, M.D.

## 2013-04-24 NOTE — BH Assessment (Signed)
Tele Assessment Note   Audrey Johnson is an 72 y.o. female that was assessed via tele assessment this day after being referred by her PCP, Dr. Tenny Craw (see note in EPIC).  Dr. Tenny Craw called [facility], spoke with this clinician, and informed writer that pt would be sent to [facility], as she felt hospitalization was needed for the pt.  Pt has been reporting abdominal pain with nausea x 2 months.  No medical cause for this has been found per medical record.  Pt reports SI, stating she wants to walk out into traffic because of the pain.  Pt also reported seeing and hearing (AVH) a figure in white come out of her bushes in her yard and that it was talking, but she couldn't understand what it was saying.  She did state that she thought it was going to hurt her or the family.  Pt was tearful during session, spitting into a bag.  Pt has lost 20 lbs in 2 mos, stating she cannot eat.  Pt endorses sx of depression and anxiety.  Pt was started on Lexapro and Xanax per her family, but pt did not take the Lexapro.  Pt has not had any previous mental health or SA treatment by report.  Pt denies SA.  Pt lives with her son.  Her nieces were present with her and supportive.  Consulted with EDP Effie Shy, who stated inpatient treatment warranted, based on pt's presenting sx, as well as recommendations from Dr. Tenny Craw.  Completed tele assessment.  TTS to find inpatient placement for pt.  TTS staf and ED staff updated.   Axis I: 298.9 Psychotic Disorder NOS Axis II: Deferred Axis III:  Past Medical History  Diagnosis Date  . Hypertension   . High cholesterol   . Chronic back pain   . Anxiety   . Shortness of breath      even walking causes sob...   . Arthritis     in back .   Marland Kitchen Pneumonia   . Borderline diabetes   . Chronic abdominal pain   . Chronic constipation   . Chronic nausea   . Noncompliance with medication regimen   . GERD (gastroesophageal reflux disease)    Axis IV: other psychosocial or environmental problems Axis  V: 21-30 behavior considerably influenced by delusions or hallucinations OR serious impairment in judgment, communication OR inability to function in almost all areas  Past Medical History:  Past Medical History  Diagnosis Date  . Hypertension   . High cholesterol   . Chronic back pain   . Anxiety   . Shortness of breath      even walking causes sob...   . Arthritis     in back .   Marland Kitchen Pneumonia   . Borderline diabetes   . Chronic abdominal pain   . Chronic constipation   . Chronic nausea   . Noncompliance with medication regimen   . GERD (gastroesophageal reflux disease)     Past Surgical History  Procedure Laterality Date  . Abdominal hysterectomy    . Toe surgery    . Dilation and curettage of uterus    . Breast surgery  unknown (many yrs)    excision  of lump right breast  was per pt "nothing"   . Cardiac catheterization    . Colonoscopy  04/16/2007    RMR: Normal rectum and colon  . Colonoscopy with esophagogastroduodenoscopy (egd) N/A 01/08/2013    AVW:UJWJ erosive reflux esophagitis. Negative H.pylori. Small hiatal hernia-s/p bx-TCS/Colonic  polyp-hyperplastic    Family History:  Family History  Problem Relation Age of Onset  . Colon cancer Neg Hx     Social History:  reports that she has never smoked. She does not have any smokeless tobacco history on file. She reports that she does not drink alcohol or use illicit drugs.  Additional Social History:  Alcohol / Drug Use Pain Medications: see MAR Prescriptions: see MAR Over the Counter: see MAR History of alcohol / drug use?: No history of alcohol / drug abuse Longest period of sobriety (when/how long):  (pt denies) Negative Consequences of Use:  (na) Withdrawal Symptoms:  (na)  CIWA: CIWA-Ar BP: 137/58 mmHg Pulse Rate: 85 COWS:    Allergies: No Known Allergies  Home Medications:  (Not in a hospital admission)  OB/GYN Status:  No LMP recorded. Patient has had a hysterectomy.  General Assessment  Data Location of Assessment: [facility] Is this a Tele or Face-to-Face Assessment?: Tele Assessment Is this an Initial Assessment or a Re-assessment for this encounter?: Initial Assessment Living Arrangements: Other relatives Can pt return to current living arrangement?: Yes Admission Status: Voluntary Is patient capable of signing voluntary admission?: Yes Transfer from: Acute Hospital Referral Source: MD  Medical Screening Exam [facility] Walk-in ONLY) Medical Exam completed: No Reason for MSE not completed: Other: (pt med cleared at [facility])  [facility] Crisis Care Plan Living Arrangements: Other relatives Name of Psychiatrist: none Name of Therapist: none  Education Status Is patient currently in school?: No  Risk to self Suicidal Ideation: Yes-Currently Present Suicidal Intent: Yes-Currently Present Is patient at risk for suicide?: Yes Suicidal Plan?: Yes-Currently Present Specify Current Suicidal Plan: to walk out into traffic Access to Means: Yes Specify Access to Suicidal Means: can walk out into traffic What has been your use of drugs/alcohol within the last 12 months?: pt denies Previous Attempts/Gestures: No How many times?: 0 Other Self Harm Risks: pt denies Triggers for Past Attempts: None known Intentional Self Injurious Behavior: None Family Suicide History: No Recent stressful life event(s): Other (Comment) (reports abdomincal pain and nausea, SI, psychosis) Persecutory voices/beliefs?: No Depression: Yes Depression Symptoms: Despondent;Insomnia;Tearfulness;Fatigue;Feeling worthless/self pity Substance abuse history and/or treatment for substance abuse?: No Suicide prevention information given to non-admitted patients: Not applicable  Risk to Others Homicidal Ideation: No Thoughts of Harm to Others: No Current Homicidal Intent: No Current Homicidal Plan: No Access to Homicidal Means: No Identified Victim: pt denies History of harm to others?: No Assessment of Violence:  None Noted Violent Behavior Description: na - pt calm, cooperative Criminal Charges Pending?: No Does patient have a court date: No  Psychosis Hallucinations: Auditory;Visual (saw figure in white in the woods) Delusions:  (paranoia)  Mental Status Report Appear/Hygiene: Disheveled Eye Contact: Fair Motor Activity: Restlessness Speech: Logical/coherent Level of Consciousness: Alert;Crying Mood: Depressed;Anxious Affect: Depressed;Anxious Anxiety Level: Moderate Thought Processes: Coherent;Relevant Judgement: Impaired Orientation: Person;Place;Situation Obsessive Compulsive Thoughts/Behaviors: None  Cognitive Functioning Concentration: Decreased Memory: Recent Impaired;Remote Impaired IQ: Average Insight: Poor Impulse Control: Fair Appetite: Fair Weight Loss: 0 Weight Gain: 0 Sleep: Decreased Total Hours of Sleep: 3 Vegetative Symptoms: None  ADLScreening [facility] Assessment Services) Patient's cognitive ability adequate to safely complete daily activities?: Yes Patient able to express need for assistance with ADLs?: Yes Independently performs ADLs?: Yes (appropriate for developmental age)  Prior Inpatient Therapy Prior Inpatient Therapy: No Prior Therapy Dates: na Prior Therapy Facilty/Provider(s): na Reason for Treatment: na  Prior Outpatient Therapy Prior Outpatient Therapy: No Prior Therapy Dates: na Prior Therapy Facilty/Provider(s): na  Reason for Treatment: na  ADL Screening (condition at time of admission) Patient's cognitive ability adequate to safely complete daily activities?: Yes Is the patient deaf or have difficulty hearing?: No Does the patient have difficulty seeing, even when wearing glasses/contacts?: No Does the patient have difficulty concentrating, remembering, or making decisions?: No Patient able to express need for assistance with ADLs?: Yes Does the patient have difficulty dressing or bathing?: No Independently performs ADLs?: Yes  (appropriate for developmental age) Does the patient have difficulty walking or climbing stairs?: No  Home Assistive Devices/Equipment Home Assistive Devices/Equipment: None    Abuse/Neglect Assessment (Assessment to be complete while patient is alone) Physical Abuse: Denies Verbal Abuse: Denies Sexual Abuse: Denies Exploitation of patient/patient's resources: Denies Self-Neglect: Denies Values / Beliefs Cultural Requests During Hospitalization: None Spiritual Requests During Hospitalization: None Consults Spiritual Care Consult Needed: No Social Work Consult Needed: No Merchant navy officer (For Healthcare) Advance Directive: Patient does not have advance directive;Patient would not like information    Additional Information 1:1 In Past 12 Months?: No CIRT Risk: No Elopement Risk: No Does patient have medical clearance?: Yes     Disposition:  Disposition Initial Assessment Completed for this Encounter: Yes Disposition of Patient: Referred to;Inpatient treatment program Type of inpatient treatment program: Adult Patient referred to: Other (Comment)  Caryl Comes 04/24/2013 12:59 PM

## 2013-04-24 NOTE — ED Notes (Signed)
Pt laying in bed, still tearful, speaks in even sentences. Pt still complaining of stomach pain.

## 2013-04-24 NOTE — Telephone Encounter (Signed)
Forwarding to Alfonso Patten very unclear of what Ms. Pieri is asking for

## 2013-04-24 NOTE — ED Notes (Signed)
Patient complaining of abdomen pain at this time. Spitting in emesis bag.

## 2013-04-24 NOTE — ED Notes (Signed)
Spoke with representative from [facility] in [place], currently reviewing chart.

## 2013-04-24 NOTE — BH Assessment (Signed)
[facility] Assessment Progress Note  Received call from [facility] @ 18:07.  Their physician has declined pt due to medical acuity.  Doylene Canning, MA Triage Specialist 04/24/13 @ 18:08

## 2013-04-24 NOTE — ED Notes (Signed)
Assisted patient to restroom , states that they give me medicine for my stomach and it only last for a little while.

## 2013-04-24 NOTE — BH Assessment (Addendum)
BHH Assessment Progress Note Update:  Updated EDP Wentz regarding recommendations for pt to be placed in a gero psych unit per Dr. Dub Mikes and Fransisca Kaufmann, NP (see note in [facility]) @ 1650.  Update:  Tele psych to be completed by Fransisca Kaufmann, NP @ 1600.  Updated TTS and ED staff.  Update:  Called EDP Effie Shy and he ordered a tele psych for medication recommendations.  TTS to call with appt time.
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
## 2013-04-24 NOTE — ED Notes (Signed)
Pt's family left message to call with info, pt's sister Clide Deutscher [phone], [phone]

## 2013-04-24 NOTE — Telephone Encounter (Signed)
Pt called this morning asking for RMR. She wants him to refer her to "[id number]" instead of [place]. I told her I would take the message and someone would have to call her back. Pt agreed. [phone]

## 2013-04-24 NOTE — ED Notes (Signed)
Pt c/o abd pain x 2 months.  Reports nausea, intermittent vomiting but spits a lot.  Reports LBM was this morning.  Family concerned because pt has been making comments like, "I know an easy way out."  Pt denies having a plan, denies HI.  Pt tearful.

## 2013-04-24 NOTE — Progress Notes (Signed)
Completed placement search for patient by contacting the facilities listed below:  [facility]- per warren, bed available, referral faxed [facility]- per Misty Stanley, no beds, referral faxed for wait list [facility]- per Thayer Ohm, bed available, referral faxed [facility]- per Lelon Mast, accepting intakes, referral faxed [facility] per pat, accepting intakes for voluntary pt.s only, referral faxed [place]-  Per grace, possible beds available tomorrow, referral faxed  [facility]- per [place], no beds [facility]- no answer [facility]- at capacity [facility]- per Victorino Dike, no beds [facility]- No answer [place]- sent to voicemail  Rodman Pickle, MHT

## 2013-04-24 NOTE — Patient Instructions (Signed)
Pt to go to ER for possible admission to [facility]

## 2013-04-25 DIAGNOSIS — K219 Gastro-esophageal reflux disease without esophagitis: Secondary | ICD-10-CM | POA: Diagnosis present

## 2013-04-25 DIAGNOSIS — R945 Abnormal results of liver function studies: Secondary | ICD-10-CM | POA: Diagnosis not present

## 2013-04-25 DIAGNOSIS — Z792 Long term (current) use of antibiotics: Secondary | ICD-10-CM | POA: Diagnosis not present

## 2013-04-25 DIAGNOSIS — I10 Essential (primary) hypertension: Secondary | ICD-10-CM | POA: Diagnosis not present

## 2013-04-25 DIAGNOSIS — R109 Unspecified abdominal pain: Secondary | ICD-10-CM | POA: Diagnosis present

## 2013-04-25 DIAGNOSIS — R112 Nausea with vomiting, unspecified: Secondary | ICD-10-CM | POA: Diagnosis not present

## 2013-04-25 DIAGNOSIS — E119 Type 2 diabetes mellitus without complications: Secondary | ICD-10-CM | POA: Diagnosis not present

## 2013-04-25 DIAGNOSIS — G8929 Other chronic pain: Secondary | ICD-10-CM | POA: Diagnosis present

## 2013-04-25 DIAGNOSIS — R45851 Suicidal ideations: Secondary | ICD-10-CM | POA: Diagnosis not present

## 2013-04-25 DIAGNOSIS — H9190 Unspecified hearing loss, unspecified ear: Secondary | ICD-10-CM | POA: Diagnosis present

## 2013-04-25 DIAGNOSIS — M549 Dorsalgia, unspecified: Secondary | ICD-10-CM | POA: Diagnosis present

## 2013-04-25 DIAGNOSIS — E785 Hyperlipidemia, unspecified: Secondary | ICD-10-CM | POA: Diagnosis present

## 2013-04-25 DIAGNOSIS — F331 Major depressive disorder, recurrent, moderate: Secondary | ICD-10-CM | POA: Diagnosis not present

## 2013-04-25 MED ORDER — POTASSIUM CHLORIDE CRYS ER 20 MEQ PO TBCR
40.0000 meq | EXTENDED_RELEASE_TABLET | Freq: Once | ORAL | Status: AC
  Administered 2013-04-25: 40 meq via ORAL
  Filled 2013-04-25: qty 2

## 2013-04-25 MED ORDER — DICYCLOMINE HCL 10 MG PO CAPS
10.0000 mg | ORAL_CAPSULE | Freq: Once | ORAL | Status: AC
  Administered 2013-04-25: 10 mg via ORAL
  Filled 2013-04-25: qty 1

## 2013-04-25 NOTE — BH Assessment (Signed)
[facility] Assessment Progress Note Called the following favilities regarding pt referral.  Now aware pt accepted [facility] per [place], MHT.  [facility] - no beds untail Monday per [place] @ [address] - left message @ [phone] [facility] - no beds per [facility] @ [address] [facility] - beds per Herbert Seta @ [phone] - referral faxed for review - will call to inform pt accepted elsewhere

## 2013-04-25 NOTE — ED Notes (Signed)
Pt requesting pain medicine or the bentyl I gave her earlier.

## 2013-04-25 NOTE — ED Notes (Signed)
Pt sleeping with sitter at bedside 

## 2013-04-25 NOTE — Progress Notes (Signed)
Phone call made to [facility] regarding pt placement, per Marcy Salvo pt accepted, phone call for report [phone].  Facility awaiting pt's arrival.  Phone call placed to [facility] ED, spoke with pt's RN Brett Canales, made aware that pt has been accepted to [facility] and number was given for report.    Tomi Bamberger, MHT

## 2013-04-25 NOTE — ED Notes (Signed)
Pt still complaining with abdominal pain. EDP aware and orders for bentyl received.

## 2013-04-25 NOTE — ED Notes (Signed)
[facility] called and advised patient was accepted to [facility].

## 2013-04-27 NOTE — Telephone Encounter (Signed)
I am not sure what she is referencing. Appears she has been referred for psych evaluation. Let's call her if she is at home and see if we can find out more information.

## 2013-04-28 NOTE — Telephone Encounter (Signed)
Per ED note- pt was accepted to [facility] for inpatient services.

## 2013-04-29 NOTE — Telephone Encounter (Signed)
Patient will need to obtain psychiatric referrals through PCP

## 2013-04-30 ENCOUNTER — Ambulatory Visit (INDEPENDENT_AMBULATORY_CARE_PROVIDER_SITE_OTHER): Payer: Medicare Other | Admitting: Psychiatry

## 2013-04-30 ENCOUNTER — Encounter (HOSPITAL_COMMUNITY): Payer: Self-pay | Admitting: Psychiatry

## 2013-04-30 VITALS — BP 140/70 | Ht 63.0 in | Wt 153.0 lb

## 2013-04-30 DIAGNOSIS — F32A Depression, unspecified: Secondary | ICD-10-CM

## 2013-04-30 DIAGNOSIS — F323 Major depressive disorder, single episode, severe with psychotic features: Secondary | ICD-10-CM

## 2013-04-30 DIAGNOSIS — F459 Somatoform disorder, unspecified: Secondary | ICD-10-CM | POA: Diagnosis not present

## 2013-04-30 DIAGNOSIS — F329 Major depressive disorder, single episode, unspecified: Secondary | ICD-10-CM

## 2013-04-30 MED ORDER — ALPRAZOLAM 0.25 MG PO TABS: 0.2500 mg | ORAL_TABLET | Freq: Three times a day (TID) | ORAL | Status: DC

## 2013-04-30 MED ORDER — GABAPENTIN 100 MG PO CAPS: 100.0000 mg | ORAL_CAPSULE | Freq: Three times a day (TID) | ORAL | Status: DC

## 2013-04-30 MED ORDER — MIRTAZAPINE 7.5 MG PO TABS: 7.5000 mg | ORAL_TABLET | Freq: Every day | ORAL | Status: DC

## 2013-04-30 NOTE — Progress Notes (Signed)
Patient ID: Audrey Johnson, female   DOB: 1941/04/29, 72 y.o.   MRN: [id number]  Psychiatric Assessment Adult  Patient Identification:  Audrey Johnson Date of Evaluation:  04/30/2013 Chief Complaint: "Her stomach hurts all the time and no one can find anything wrong" History of Chief Complaint:   Chief Complaint  Patient presents with  . Anxiety  . Depression  . Follow-up    Anxiety Symptoms include confusion, nausea, nervous/anxious behavior and suicidal ideas.    Altered Mental Status Associated symptoms include abdominal pain and nausea.   this patient is a 72 year old widowed black female who lives with her son in [place]. She's accompanied by her sister Sallye Ober and her son-in-law Duke Salvia. I also spoke on the phone to her daughter Jasmine December. The patient used to work in a [employer] but has been retired for several years.  The patient was referred by Dr. Margo Aye, her primary Dr. Apparently she's not had any history of mental health symptoms until about 2 months ago. She started to have severe stomach pain and has gone to several emergency room this in the local area. She's had 9 emergency room visits in the last couple of months. Her last one was yesterday. She's had 3 abdominal CTs in the last month, and ultrasound and blood work. Nothing significantly wrong has been found.  The patient is on numerous medications for her stomach but they don't seem to help. She was prescribed Lexapro by her primary doctor but she refused to take it. Chest she is taking a low dose of Xanax.  According to her family the patient has become more distraught and unreachable. She's constantly grabbing her stomach and complaining of pain. She claims that she can't eat and has lost 20 pounds in the last 2 months. She's not sleeping at night and she is crying all the time. She claims that she saw a vision of an animal coming out of the woods and it was going to get her family. Of most concern is the fact  that she claims she is so miserable that she's going to walk out in front of traffic and kill herself.  The patient returns after 6 days. Last time I sent her to the emergency room for placement in a psychiatric hospital. She was sent to [facility] on October 25 and returned yesterday on October 29. She seemed to benefit from the activities there. The only new changes that she is now on Remeron at night as well as Neurontin through the day. They did give her oxycodone but did not give her a prescription to take home. She's also no longer on the antibiotics and I explained to her daughter Jasmine December that they need to discuss this with Dr. Margo Aye.  The patient is still complaining of abdominal pain. I reviewed with her all her studies including 3 abdominal CTs a colonoscopy and endoscopy have all been normal except for mild gastric irritation on the endoscopy. She is no longer crying and moaning but is still clutching her stomach and complaining of pain intermittently. Her mood is better and she's no longer suicidal and no longer is having any nightmares or visions. She is sleeping better and has gained 3 pounds. Review of Systems  Constitutional: Positive for appetite change and unexpected weight change.  Gastrointestinal: Positive for nausea and abdominal pain.  Psychiatric/Behavioral: Positive for suicidal ideas, hallucinations, behavioral problems, confusion and agitation. The patient is nervous/anxious.    Physical Exam not done  Depressive Symptoms: depressed  mood, anhedonia, insomnia, psychomotor agitation, difficulty concentrating, hopelessness, suicidal thoughts with specific plan, anxiety, loss of energy/fatigue, weight loss,  (Hypo) Manic Symptoms:   Elevated Mood:  No Irritable Mood:  Yes Grandiosity:  No Distractibility:  No Labiality of Mood:  Yes Delusions:  Yes Hallucinations:  Yes Impulsivity:  No Sexually Inappropriate Behavior:  No Financial Extravagance:   No Flight of Ideas:  No  Anxiety Symptoms: Excessive Worry:  Yes Panic Symptoms:  No Agoraphobia:  No Obsessive Compulsive: No  Symptoms: None, Specific Phobias:  No Social Anxiety:  No  Psychotic Symptoms:  Hallucinations: Yes Visual Delusions:  Yes Paranoia:  No   Ideas of Reference:  No  PTSD Symptoms: Ever had a traumatic exposure:  No Had a traumatic exposure in the last month:  No Re-experiencing: No None Hypervigilance:  No Hyperarousal: No None Avoidance: No None  Traumatic Brain Injury: No   Past Psychiatric History: Diagnosis: None   Hospitalizations: None   Outpatient Care: None   Substance Abuse Care: None   Self-Mutilation: None   Suicidal Attempts: None   Violent Behaviors: None    Past Medical History:   Past Medical History  Diagnosis Date  . Hypertension   . High cholesterol   . Chronic back pain   . Anxiety   . Shortness of breath      even walking causes sob...   . Arthritis     in back .   Marland Kitchen Pneumonia   . Borderline diabetes   . Chronic abdominal pain   . Chronic constipation   . Chronic nausea   . Noncompliance with medication regimen   . GERD (gastroesophageal reflux disease)    History of Loss of Consciousness:  No Seizure History:  No Cardiac History:  No Allergies:  No Known Allergies Current Medications:  Current Outpatient Prescriptions  Medication Sig Dispense Refill  . ALPRAZolam (XANAX) 0.25 MG tablet Take 1 tablet (0.25 mg total) by mouth 3 (three) times daily.  30 tablet  2  . amoxicillin (AMOXIL) 500 MG capsule Take 1,000 mg by mouth 2 (two) times daily. 10 day course starting on 04/12/2013      . clarithromycin (BIAXIN) 500 MG tablet Take 500 mg by mouth 2 (two) times daily. 10 day course starting on 04/12/2013      . escitalopram (LEXAPRO) 10 MG tablet Take 10 mg by mouth daily.      Marland Kitchen gabapentin (NEURONTIN) 100 MG capsule Take 100 mg by mouth 3 (three) times daily as needed (Back Pain).      Marland Kitchen gabapentin  (NEURONTIN) 100 MG capsule Take 1 capsule (100 mg total) by mouth 3 (three) times daily.  90 capsule  2  . HYDROcodone-acetaminophen (NORCO/VICODIN) 5-325 MG per tablet Take 1 tablet by mouth every 4 (four) hours as needed for pain.  15 tablet  0  . mirtazapine (REMERON) 7.5 MG tablet Take 1 tablet (7.5 mg total) by mouth at bedtime.  30 tablet  2  . olmesartan (BENICAR) 40 MG tablet Take 40 mg by mouth daily.      Marland Kitchen omeprazole (PRILOSEC) 20 MG capsule Take 20 mg by mouth daily. For acid reflux      . ondansetron (ZOFRAN-ODT) 4 MG disintegrating tablet Take 4 mg by mouth every 8 (eight) hours as needed for nausea.       No current facility-administered medications for this visit.    Previous Psychotropic Medications:  Medication Dose   Xanax   0.25 mg every 8 hours  as needed                      Substance Abuse History in the last 12 months: Substance Age of 1st Use Last Use Amount Specific Type  Nicotine      Alcohol      Cannabis      Opiates      Cocaine      Methamphetamines      LSD      Ecstasy      Benzodiazepines      Caffeine      Inhalants      Others:                          Medical Consequences of Substance Abuse: n/a  Legal Consequences of Substance Abuse: n/a  Family Consequences of Substance Abuse: n/a  Blackouts:  No DT's:  No Withdrawal Symptoms:  No None  Social History: Current Place of Residence: [place] [address] Place of Birth: Unknown Family Members: Lives with son Marital Status:  Widowed Children:   Sons: 1  Daughters: 1 Relationships:  Education:  [employer] Problems/Performance:  Religious Beliefs/Practices: Unknown History of Abuse: none Armed forces technical officer; Stage manager History:  None. Legal History: None Hobbies/Interests: Church  Family History:   Family History  Problem Relation Age of Onset  . Colon cancer Neg Hx     Mental Status Examination/Evaluation: Objective:   Appearance: Slender elderly black female. She is neatly dressed. She moaned a little bit and kept her head down but at times she was more animated and talkative.   Eye Contact::  Poor  Speech:  Slow  Volume:  Decreased  Mood: Still somewhat depressed but no longer agitated   Affect:  Depressed   Thought Process:  Organized   Orientation:  Full (Time, Place, and Person)  Thought Content:  No delusions or hallucinations   Suicidal Thoughts:  No  Homicidal Thoughts:  No  Judgement:  Impaired  Insight:  Lacking  Psychomotor Activity:  Increased  Akathisia:  No  Handed:  Right  AIMS (if indicated):    Assets:  Social Support    Laboratory/X-Ray Psychological Evaluation(s)        Assessment:  Maj. depression, single episode with psychotic features and somatization disorder  AXIS I Major Depression, single episode with psychotic features, and somatization disorder   AXIS II Deferred  AXIS III Past Medical History  Diagnosis Date  . Hypertension   . High cholesterol   . Chronic back pain   . Anxiety   . Shortness of breath      even walking causes sob...   . Arthritis     in back .   Marland Kitchen Pneumonia   . Borderline diabetes   . Chronic abdominal pain   . Chronic constipation   . Chronic nausea   . Noncompliance with medication regimen   . GERD (gastroesophageal reflux disease)      AXIS IV other psychosocial or environmental problems  AXIS V 11-20 some danger of hurting self or others possible OR occasionally fails to maintain minimal personal hygiene OR gross impairment in communication   Treatment Plan/Recommendations:  Plan of Care: Medication management   Laboratory:    Psychotherapy: She is scheduled to see Florencia Reasons   Medications: The patient will continue mirtazapine 7.5 mg each bedtime as this helped her sleep. She'll restart Lexapro 10 mg every morning and Xanax 0.25  mg 3 times a day and continue Neurontin 100 mg 3 times a day. All other medications will need to be  managed by primary care including oxycodone   Routine PRN Medications:  No  Consultations:   Safety Concerns:    Other:  She'll return in 3 weeks     Diannia Ruder, MD 10/30/20149:09 AM

## 2013-05-01 ENCOUNTER — Encounter: Payer: Self-pay | Admitting: Internal Medicine

## 2013-05-01 ENCOUNTER — Ambulatory Visit (INDEPENDENT_AMBULATORY_CARE_PROVIDER_SITE_OTHER): Payer: Medicare Other | Admitting: Internal Medicine

## 2013-05-01 VITALS — BP 135/71 | HR 112 | Temp 97.4°F | Wt 152.6 lb

## 2013-05-01 DIAGNOSIS — K219 Gastro-esophageal reflux disease without esophagitis: Secondary | ICD-10-CM | POA: Diagnosis not present

## 2013-05-01 DIAGNOSIS — R1013 Epigastric pain: Secondary | ICD-10-CM

## 2013-05-01 DIAGNOSIS — K5900 Constipation, unspecified: Secondary | ICD-10-CM | POA: Diagnosis not present

## 2013-05-01 DIAGNOSIS — G8929 Other chronic pain: Secondary | ICD-10-CM | POA: Diagnosis not present

## 2013-05-01 MED ORDER — LINACLOTIDE 290 MCG PO CAPS: 290.0000 ug | ORAL_CAPSULE | Freq: Every day | ORAL | Status: DC

## 2013-05-01 NOTE — Addendum Note (Signed)
Addended by: Lavena Bullion on: 05/01/2013 09:29 AM   Modules accepted: Orders

## 2013-05-01 NOTE — Patient Instructions (Signed)
Continue omeprazole 40 mg daily  Resume Linzess 290 -one capsule daily.  Office visit with [place] in 12 weeks.  Keep appointment with Dr. Margo Aye.

## 2013-05-01 NOTE — Progress Notes (Signed)
Primary Care Physician:  Catalina Pizza, MD Primary Gastroenterologist:  Dr. Jena Gauss  Pre-Procedure History & Physical: HPI:  Audrey Johnson is a 72 y.o. female here for followup of chronic abdominal pain. She's had extensive evaluation CTA, EGD /colonoscopy CT and more recently MRI of the spine. Previously treated for urinary tract infection ongoing treatment for reflux esophagitis. Was treated for H. pylori infection. She hs seen her neurosurgeon who feels that her degenerative disc disease is stable and not producing any radicular pain; weight is down 12 pounds.  Psychiatric issues have both complicated the picture. She presented to the hospital recently with suicidal ideations and was committed  - she just got out of a facility 2 days ago. She is  tearful and in the fetal position in the exam room chair. She is conversant. She states she has no suicidal ideations at this point in time. She tells me her "nerves" got the best of her. She has not been very hungry lately. Her bowels were moving good on Linzess.  However, she ran out of the prescription and ddid not get it refilled. Her weight is down 12 pounds from when she was seen previously.  Past Medical History  Diagnosis Date  . Hypertension   . High cholesterol   . Chronic back pain   . Anxiety   . Shortness of breath      even walking causes sob...   . Arthritis     in back .   Marland Kitchen Pneumonia   . Borderline diabetes   . Chronic abdominal pain   . Chronic constipation   . Chronic nausea   . Noncompliance with medication regimen   . GERD (gastroesophageal reflux disease)     Past Surgical History  Procedure Laterality Date  . Abdominal hysterectomy    . Toe surgery    . Dilation and curettage of uterus    . Breast surgery  unknown (many yrs)    excision  of lump right breast  was per pt "nothing"   . Cardiac catheterization    . Colonoscopy  04/16/2007    RMR: Normal rectum and colon  . Colonoscopy with  esophagogastroduodenoscopy (egd) N/A 01/08/2013    ZOX:WRUE erosive reflux esophagitis. Negative H.pylori. Small hiatal hernia-s/p bx-TCS/Colonic polyp-hyperplastic    Prior to Admission medications   Medication Sig Start Date End Date Taking? Authorizing Provider  ALPRAZolam (XANAX) 0.25 MG tablet Take 1 tablet (0.25 mg total) by mouth 3 (three) times daily. 04/30/13  Yes Diannia Ruder, MD  mirtazapine (REMERON) 7.5 MG tablet Take 1 tablet (7.5 mg total) by mouth at bedtime. 04/30/13  Yes Diannia Ruder, MD  olmesartan-hydrochlorothiazide (BENICAR HCT) 40-25 MG per tablet Take 1 tablet by mouth daily.   Yes Historical Provider, MD  omeprazole (PRILOSEC) 20 MG capsule Take 20 mg by mouth daily. For acid reflux 04/12/13  Yes Historical Provider, MD  ondansetron (ZOFRAN-ODT) 4 MG disintegrating tablet Take 4 mg by mouth every 8 (eight) hours as needed for nausea.   Yes Historical Provider, MD  amoxicillin (AMOXIL) 500 MG capsule Take 1,000 mg by mouth 2 (two) times daily. 10 day course starting on 04/12/2013    Historical Provider, MD  clarithromycin (BIAXIN) 500 MG tablet Take 500 mg by mouth 2 (two) times daily. 10 day course starting on 04/12/2013    Historical Provider, MD  escitalopram (LEXAPRO) 10 MG tablet Take 10 mg by mouth daily.    Historical Provider, MD  gabapentin (NEURONTIN) 100 MG capsule Take 100  mg by mouth 3 (three) times daily as needed (Back Pain).    Historical Provider, MD  gabapentin (NEURONTIN) 100 MG capsule Take 1 capsule (100 mg total) by mouth 3 (three) times daily. 04/30/13 04/30/14  Diannia Ruder, MD  HYDROcodone-acetaminophen (NORCO/VICODIN) 5-325 MG per tablet Take 1 tablet by mouth every 4 (four) hours as needed for pain. 04/21/13   Kristen N Ward, DO    Allergies as of 05/01/2013  . (No Known Allergies)    Family History  Problem Relation Age of Onset  . Colon cancer Neg Hx     History   Social History  . Marital Status: Widowed    Spouse Name: N/A     Number of Children: N/A  . Years of Education: N/A   Occupational History  . Not on file.   Social History Main Topics  . Smoking status: Never Smoker   . Smokeless tobacco: Not on file  . Alcohol Use: No  . Drug Use: No  . Sexual Activity: No   Other Topics Concern  . Not on file   Social History Narrative  . No narrative on file    Review of Systems: See HPI, otherwise negative ROS  Physical Exam: BP 135/71  Pulse 112  Temp(Src) 97.4 F (36.3 C) (Oral)  Wt 152 lb 9.6 oz (69.219 kg)  BMI 27.04 kg/m2 General:  Awake, conversant but tearful and depressed appearing Skin:  Intact without significant lesions or rashes. Eyes:  Sclera clear, no icterus.   Conjunctiva pink. Ears:  Normal auditory acuity. Nose:  No deformity, discharge,  or lesions. Mouth:  No deformity or lesions. Neck:  Supple; no masses or thyromegaly. No significant cervical adenopathy. Lungs:  Clear throughout to auscultation.   No wheezes, crackles, or rhonchi. No acute distress. Heart:  Regular rate and rhythm; no murmurs, clicks, rubs,  or gallops. Abdomen: Non-distended, normal bowel sounds.  Soft and nontender without appreciable mass or hepatosplenomegaly.  Pulses:  Normal pulses noted. Extremities:  Without clubbing or edema.  Impression/Plan:  Chronic abdominal pain in a 73 year old lady with significant psychiatric issues. She's had an extensive GI evaluation. I suspect constipation may be a minor contributing factor to her abdominal complaints. There is a significant functional overlay. She has lost some weight recently but this is been largely due to diminished caloric intake.  History of H. pylori infection treated. GERD well-controlled on Prilosec.  For now, continue current GI regimen including Prilosec 40 mg daily.  Resume Linzess 290-one capsule daily. Samples and prescription provided.  Keep appointment with Dr. Margo Aye. We'll plan to see her back in the office in 3 months to reassess,  weight etc.

## 2013-05-01 NOTE — Progress Notes (Signed)
done

## 2013-05-05 DIAGNOSIS — R109 Unspecified abdominal pain: Secondary | ICD-10-CM | POA: Diagnosis not present

## 2013-05-05 DIAGNOSIS — R630 Anorexia: Secondary | ICD-10-CM | POA: Diagnosis not present

## 2013-05-05 DIAGNOSIS — R634 Abnormal weight loss: Secondary | ICD-10-CM | POA: Diagnosis not present

## 2013-05-05 DIAGNOSIS — R000 Tachycardia, unspecified: Secondary | ICD-10-CM | POA: Diagnosis not present

## 2013-05-11 ENCOUNTER — Ambulatory Visit (HOSPITAL_COMMUNITY): Payer: Self-pay | Admitting: Psychiatry

## 2013-05-13 DIAGNOSIS — R634 Abnormal weight loss: Secondary | ICD-10-CM | POA: Diagnosis not present

## 2013-05-13 DIAGNOSIS — R109 Unspecified abdominal pain: Secondary | ICD-10-CM | POA: Diagnosis not present

## 2013-05-13 DIAGNOSIS — R112 Nausea with vomiting, unspecified: Secondary | ICD-10-CM | POA: Diagnosis not present

## 2013-05-13 DIAGNOSIS — R630 Anorexia: Secondary | ICD-10-CM | POA: Diagnosis not present

## 2013-05-21 ENCOUNTER — Ambulatory Visit (INDEPENDENT_AMBULATORY_CARE_PROVIDER_SITE_OTHER): Payer: Medicare Other | Admitting: Psychiatry

## 2013-05-21 ENCOUNTER — Encounter (HOSPITAL_COMMUNITY): Payer: Self-pay | Admitting: Psychiatry

## 2013-05-21 VITALS — BP 150/70 | Ht 63.0 in | Wt 153.0 lb

## 2013-05-21 DIAGNOSIS — F323 Major depressive disorder, single episode, severe with psychotic features: Secondary | ICD-10-CM

## 2013-05-21 DIAGNOSIS — F459 Somatoform disorder, unspecified: Secondary | ICD-10-CM | POA: Diagnosis not present

## 2013-05-21 DIAGNOSIS — F329 Major depressive disorder, single episode, unspecified: Secondary | ICD-10-CM

## 2013-05-21 DIAGNOSIS — F32A Depression, unspecified: Secondary | ICD-10-CM

## 2013-05-21 MED ORDER — ESCITALOPRAM OXALATE 10 MG PO TABS: 10.0000 mg | ORAL_TABLET | Freq: Every day | ORAL | Status: DC

## 2013-05-21 MED ORDER — ALPRAZOLAM 0.25 MG PO TABS: 0.2500 mg | ORAL_TABLET | Freq: Three times a day (TID) | ORAL | Status: DC

## 2013-05-21 MED ORDER — GABAPENTIN 100 MG PO CAPS: 100.0000 mg | ORAL_CAPSULE | Freq: Three times a day (TID) | ORAL | Status: DC

## 2013-05-21 MED ORDER — MIRTAZAPINE 7.5 MG PO TABS: 7.5000 mg | ORAL_TABLET | Freq: Every day | ORAL | Status: DC

## 2013-05-21 NOTE — Progress Notes (Signed)
Patient ID: Audrey Johnson, female   DOB: 1941-05-14, 72 y.o.   MRN: [id number] Patient ID: Audrey Johnson, female   DOB: 1940-11-26, 72 y.o.   MRN: [id number]  Psychiatric Assessment Adult  Patient Identification:  Audrey Johnson Date of Evaluation:  05/21/2013 Chief Complaint: "Her stomach hurts all the time and no one can find anything wrong" History of Chief Complaint:   Chief Complaint  Patient presents with  . Anxiety  . Depression  . Follow-up    Anxiety Symptoms include confusion, nausea, nervous/anxious behavior and suicidal ideas.    Altered Mental Status Associated symptoms include abdominal pain and nausea.   this patient is a 72 year old widowed black female who lives with her son in [place]. She's accompanied by her sister Audrey Johnson and her son-in-law Audrey Johnson. I also spoke on the phone to her daughter Audrey Johnson. The patient used to work in a [employer] but has been retired for several years.  The patient was referred by Dr. Margo Aye, her primary Dr. Apparently she's not had any history of mental health symptoms until about 2 months ago. She started to have severe stomach pain and has gone to several emergency room this in the local area. She's had 9 emergency room visits in the last couple of months. Her last one was yesterday. She's had 3 abdominal CTs in the last month, and ultrasound and blood work. Nothing significantly wrong has been found.  The patient is on numerous medications for her stomach but they don't seem to help. She was prescribed Lexapro by her primary doctor but she refused to take it. Chest she is taking a low dose of Xanax.  According to her family the patient has become more distraught and unreachable. She's constantly grabbing her stomach and complaining of pain. She claims that she can't eat and has lost 20 pounds in the last 2 months. She's not sleeping at night and she is crying all the time. She claims that she saw a vision of an animal  coming out of the woods and it was going to get her family. Of most concern is the fact that she claims she is so miserable that she's going to walk out in front of traffic and kill herself.  She returns after 3 weeks. As noted last time she has spent about 5 days at [facility]. She is definitely doing better. She saw her GI doctor about 2 weeks ago and he could not find anything wrong other than constipation. She's now back on [place]. She is eating better and has not lost any more weight. She's sleeping well, her energy is coming around and her mood is much improved. She's no longer anxious and is getting out a little bit more. She denies auditory hallucinations or suicidal ideation. Review of Systems  Constitutional: Positive for appetite change and unexpected weight change.  Gastrointestinal: Positive for nausea and abdominal pain.  Psychiatric/Behavioral: Positive for suicidal ideas, hallucinations, behavioral problems, confusion and agitation. The patient is nervous/anxious.    Physical Exam not done  Depressive Symptoms: depressed mood, anhedonia, insomnia, psychomotor agitation, difficulty concentrating, hopelessness, suicidal thoughts with specific plan, anxiety, loss of energy/fatigue, weight loss,  (Hypo) Manic Symptoms:   Elevated Mood:  No Irritable Mood:  Yes Grandiosity:  No Distractibility:  No Labiality of Mood:  Yes Delusions:  Yes Hallucinations:  Yes Impulsivity:  No Sexually Inappropriate Behavior:  No Financial Extravagance:  No Flight of Ideas:  No  Anxiety Symptoms: Excessive Worry:  Yes Panic  Symptoms:  No Agoraphobia:  No Obsessive Compulsive: No  Symptoms: None, Specific Phobias:  No Social Anxiety:  No  Psychotic Symptoms:  Hallucinations: Yes Visual Delusions:  Yes Paranoia:  No   Ideas of Reference:  No  PTSD Symptoms: Ever had a traumatic exposure:  No Had a traumatic exposure in the last month:  No Re-experiencing: No  None Hypervigilance:  No Hyperarousal: No None Avoidance: No None  Traumatic Brain Injury: No   Past Psychiatric History: Diagnosis: None   Hospitalizations: None   Outpatient Care: None   Substance Abuse Care: None   Self-Mutilation: None   Suicidal Attempts: None   Violent Behaviors: None    Past Medical History:   Past Medical History  Diagnosis Date  . Hypertension   . High cholesterol   . Chronic back pain   . Anxiety   . Shortness of breath      even walking causes sob...   . Arthritis     in back .   Marland Kitchen Pneumonia   . Borderline diabetes   . Chronic abdominal pain   . Chronic constipation   . Chronic nausea   . Noncompliance with medication regimen   . GERD (gastroesophageal reflux disease)    History of Loss of Consciousness:  No Seizure History:  No Cardiac History:  No Allergies:  No Known Allergies Current Medications:  Current Outpatient Prescriptions  Medication Sig Dispense Refill  . ALPRAZolam (XANAX) 0.25 MG tablet Take 1 tablet (0.25 mg total) by mouth 3 (three) times daily.  30 tablet  2  . amoxicillin (AMOXIL) 500 MG capsule Take 1,000 mg by mouth 2 (two) times daily. 10 day course starting on 04/12/2013      . clarithromycin (BIAXIN) 500 MG tablet Take 500 mg by mouth 2 (two) times daily. 10 day course starting on 04/12/2013      . escitalopram (LEXAPRO) 10 MG tablet Take 1 tablet (10 mg total) by mouth daily.  30 tablet  2  . gabapentin (NEURONTIN) 100 MG capsule Take 100 mg by mouth 3 (three) times daily as needed (Back Pain).      Marland Kitchen gabapentin (NEURONTIN) 100 MG capsule Take 1 capsule (100 mg total) by mouth 3 (three) times daily.  90 capsule  2  . HYDROcodone-acetaminophen (NORCO/VICODIN) 5-325 MG per tablet Take 1 tablet by mouth every 4 (four) hours as needed for pain.  15 tablet  0  . Linaclotide (LINZESS) 290 MCG CAPS capsule Take 1 capsule (290 mcg total) by mouth daily.  30 capsule  11  . mirtazapine (REMERON) 7.5 MG tablet Take 1 tablet  (7.5 mg total) by mouth at bedtime.  30 tablet  2  . olmesartan-hydrochlorothiazide (BENICAR HCT) 40-25 MG per tablet Take 1 tablet by mouth daily.      Marland Kitchen omeprazole (PRILOSEC) 20 MG capsule Take 20 mg by mouth daily. For acid reflux      . ondansetron (ZOFRAN-ODT) 4 MG disintegrating tablet Take 4 mg by mouth every 8 (eight) hours as needed for nausea.       No current facility-administered medications for this visit.    Previous Psychotropic Medications:  Medication Dose   Xanax   0.25 mg every 8 hours as needed                      Substance Abuse History in the last 12 months: Substance Age of 1st Use Last Use Amount Specific Type  Nicotine  Alcohol      Cannabis      Opiates      Cocaine      Methamphetamines      LSD      Ecstasy      Benzodiazepines      Caffeine      Inhalants      Others:                          Medical Consequences of Substance Abuse: n/a  Legal Consequences of Substance Abuse: n/a  Family Consequences of Substance Abuse: n/a  Blackouts:  No DT's:  No Withdrawal Symptoms:  No None  Social History: Current Place of Residence: [address] Place of Birth: Unknown Family Members: Lives with son Marital Status:  Widowed Children:   Sons: 1  Daughters: 1 Relationships:  Education:  [employer] Problems/Performance:  Religious Beliefs/Practices: Unknown History of Abuse: none Armed forces technical officer; Stage manager History:  None. Legal History: None Hobbies/Interests: Church  Family History:   Family History  Problem Relation Age of Onset  . Colon cancer Neg Hx     Mental Status Examination/Evaluation: Objective:  Appearance: Slender elderly black female. She is neatly dressed.     Eye Contact::  Good   Speech:  Normal   Volume:  Decreased  Mood: Good, much improved   Affect:  Euthymic   Thought Process:  Organized   Orientation:  Full (Time, Place, and Person)  Thought  Content:  No delusions or hallucinations   Suicidal Thoughts:  No  Homicidal Thoughts:  No  Judgement:  Impaired  Insight:  Lacking  Psychomotor Activity:  Normal   Akathisia:  No  Handed:  Right  AIMS (if indicated):    Assets:  Social Support    Laboratory/X-Ray Psychological Evaluation(s)        Assessment:  Maj. depression, single episode with psychotic features and somatization disorder  AXIS I Major Depression, single episode with psychotic features, and somatization disorder   AXIS II Deferred  AXIS III Past Medical History  Diagnosis Date  . Hypertension   . High cholesterol   . Chronic back pain   . Anxiety   . Shortness of breath      even walking causes sob...   . Arthritis     in back .   Marland Kitchen Pneumonia   . Borderline diabetes   . Chronic abdominal pain   . Chronic constipation   . Chronic nausea   . Noncompliance with medication regimen   . GERD (gastroesophageal reflux disease)      AXIS IV other psychosocial or environmental problems  AXIS V 11-20 some danger of hurting self or others possible OR occasionally fails to maintain minimal personal hygiene OR gross impairment in communication   Treatment Plan/Recommendations:  Plan of Care: Medication management   Laboratory:    Psychotherapy: She is scheduled to see Florencia Reasons   Medications: The patient will continue mirtazapine 7.5 mg each bedtime as this helped her sleep. She'll restart Lexapro 10 mg every morning and Xanax 0.25 mg 3 times a day and continue Neurontin 100 mg 3 times a day. All other medications will need to be managed by primary care including oxycodone   Routine PRN Medications:  No  Consultations:   Safety Concerns:    Other:  She'll return in 6 weeks     Diannia Ruder, MD 11/20/201411:29 AM

## 2013-06-01 ENCOUNTER — Ambulatory Visit (INDEPENDENT_AMBULATORY_CARE_PROVIDER_SITE_OTHER): Payer: Medicare Other | Admitting: Psychiatry

## 2013-06-01 DIAGNOSIS — F329 Major depressive disorder, single episode, unspecified: Secondary | ICD-10-CM | POA: Diagnosis not present

## 2013-06-02 NOTE — Patient Instructions (Signed)
Discussed orally 

## 2013-06-02 NOTE — Progress Notes (Signed)
Patient:   Audrey Johnson   DOB:   07/09/1940  MR Number:  [id number]  Location:  [address] [place], [place] [address]  Date of Service:   Monday 06/01/2013  Start Time:   3:00 PM End Time:   3:50 PM  Provider/Observer:  Florencia Reasons, MSW, LCSW   Billing Code/Service:  [phone]  Chief Complaint:     Chief Complaint  Patient presents with  . Depression  . Anxiety    Reason for Service:  Patient is referred for services by psychiatrist Dr. Tenny Craw to improve coping skills. Patient began experiencing severe stomach pains to 3 months ago but medical tests did not reveal a physiological reason for the pain at that time. Patient became very distraught and hopeless. She reports saying that she might walk out in front of a car so she would be killed as she didn't want to live in constant pain per patient's report. She reports being admitted to [facility] where she stayed for 5 days for treatment of, suicidal thoughts, and psychotic symptoms. She is seeing Dr. Tenny Craw for medication management. Since her discharge from the hospital, patient reports feeling better. She denies having any suicidal ideations. She also denies stomach pain but says it remains sore. She does report back pain. Patient's son accompanies her to the initial part of the session and says that patient is doing better but can be grouchy. He reports that she has no compassion for what others are going through but does not elaborate regarding his remark. Patient appeared very surprised by son's statements. Patient shares with therapist that the only thing she could think son was talking about was the fact that she would not allow him to use her car the way he wants to as she does not trust him to take care of her car. She reports her son recently moved in with her a few months ago after losing his job, his home, and his family due to drug use. She also shares that she has spent a lot of money throughout the years helping her son with  various situations. However, she reports son has done a good job of taking care of her and that they do not have any problems at home. Patient cannot identify any other stressors but does say that she was very stressed before going to the hospital as her children did not believe she was in pain.   Current Status:  Patient reports anxiety.  Reliability of Information: Information gathered from patient, her son, and medical records  Behavioral Observation: Audrey Johnson  presents as a 72 y.o.-year-old Right African American Female who appeared her stated age. her dress was Appropriate and she was Casual and her manners were Appropriate to the situation.  There physical disabilities noted. Patient reports she became disabled due to to back pain several years ago. Patient had a very unsteady gait.  She displayed an appropriate level of cooperation and motivation.    Interactions:    Active   Attention:   low  Memory:   Impaired immediate - recalled 2/3 words  Speech (Volume):  normal  Speech:   normal pitch and normal volume  Thought Process:  Coherent and Relevant  Though Content:  WNL  Orientation:   person, place, situation, day of week, month of year and year  Judgment:   Good  Planning:   Good  Affect:    Anxious  Mood:    Anxious  Insight:   Fair  Intelligence:   normal  Marital Status/Living: Patient was born and reared in [place]. She is third of 12 siblings. She reports her parents were farmers. Patient is a widow. She reports she and her husband were married for around 50 years. Patient has 2 sons.  ages 20 and 61, and one daughter, age 58. She, her oldest son, and 24 year old niece reside in [place]. Patient reports she used to go to church regularly but has been unable to do so recently due to to problems walking.  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  Substance Use:  No concerns of substance abuse are reported.    Education:   Patient completed the sixth grade.  Medical History:   Past Medical History  Diagnosis Date  . Hypertension   . High cholesterol   . Chronic back pain   . Anxiety   . Shortness of breath      even walking causes sob...   . Arthritis     in back .   Marland Kitchen Pneumonia   . Borderline diabetes   . Chronic abdominal pain   . Chronic constipation   . Chronic nausea   . Noncompliance with medication regimen   . GERD (gastroesophageal reflux disease)     Sexual History:   History  Sexual Activity  . Sexual Activity: No    Abuse/Trauma History: Denies  Psychiatric History:  Patient has had one psychiatric hospitalization which occurred at [facility] and currently sees psychiatrist Dr. Tenny Craw for medication management. She reports no previous involvement in outpatient psychotherapy.  Family Med/Psych History:  Family History  Problem Relation Age of Onset  . Colon cancer Neg Hx     Risk of Suicide/Violence: Patient denies any suicide attempts. She reports having suicidal ideations before hospitalization but denies any suicidal ideations since that time. She denies current suicidal ideations. She denies past and current homicidal ideations. She reports no self-injurious behaviors, violence, or aggression.  Impression/DX:  Patient presents with a history of symptoms of depression that began about 3 months ago around the same time she began experiencing somatic complaints of severe stomach pain. She was hospitalized at [facility] due to to experiencing depression, suicidal ideations, and psychotic symptoms .She reports no previous episodes of depression. Patient reports feeling better since discharge and beginning to resume normal interest in activities but continues to experience anxiety. Diagnosis: Major depressive disorder, single episode  Disposition/Plan:  Patient attends the  assessment appointment today. Confidentiality and limits are discussed. The patient agrees to return for an appointment in 3 weeks for continuing assessment and treatment planning. The patient agrees to call this practice, call 911, or have someone take her to the emergency room should symptoms worsen.  Diagnosis:    Axis I:  Major depressive disorder, single episode      Axis II: Deferred       Axis III:  See medical history      Axis IV:  problems with primary support group          Axis V:  51-60 moderate symptoms

## 2013-06-10 DIAGNOSIS — R630 Anorexia: Secondary | ICD-10-CM | POA: Diagnosis not present

## 2013-06-10 DIAGNOSIS — E7800 Pure hypercholesterolemia, unspecified: Secondary | ICD-10-CM | POA: Diagnosis not present

## 2013-06-10 DIAGNOSIS — I10 Essential (primary) hypertension: Secondary | ICD-10-CM | POA: Diagnosis not present

## 2013-06-10 DIAGNOSIS — K219 Gastro-esophageal reflux disease without esophagitis: Secondary | ICD-10-CM | POA: Diagnosis not present

## 2013-06-22 ENCOUNTER — Ambulatory Visit (HOSPITAL_COMMUNITY): Payer: Self-pay | Admitting: Psychiatry

## 2013-06-30 ENCOUNTER — Other Ambulatory Visit (HOSPITAL_COMMUNITY): Payer: Self-pay | Admitting: Psychiatry

## 2013-07-06 ENCOUNTER — Ambulatory Visit (INDEPENDENT_AMBULATORY_CARE_PROVIDER_SITE_OTHER): Payer: Medicare Other | Admitting: Psychiatry

## 2013-07-06 ENCOUNTER — Encounter (HOSPITAL_COMMUNITY): Payer: Self-pay | Admitting: Psychiatry

## 2013-07-06 VITALS — BP 140/80 | Ht 63.0 in | Wt 172.8 lb

## 2013-07-06 DIAGNOSIS — F323 Major depressive disorder, single episode, severe with psychotic features: Secondary | ICD-10-CM

## 2013-07-06 DIAGNOSIS — F459 Somatoform disorder, unspecified: Secondary | ICD-10-CM | POA: Diagnosis not present

## 2013-07-06 DIAGNOSIS — F329 Major depressive disorder, single episode, unspecified: Secondary | ICD-10-CM

## 2013-07-06 MED ORDER — ALPRAZOLAM 0.25 MG PO TABS: 0.2500 mg | ORAL_TABLET | Freq: Three times a day (TID) | ORAL | Status: DC

## 2013-07-06 MED ORDER — MIRTAZAPINE 7.5 MG PO TABS: 7.5000 mg | ORAL_TABLET | Freq: Every day | ORAL | Status: DC

## 2013-07-06 MED ORDER — ESCITALOPRAM OXALATE 10 MG PO TABS: 10.0000 mg | ORAL_TABLET | Freq: Every day | ORAL | Status: DC

## 2013-07-06 MED ORDER — GABAPENTIN 100 MG PO CAPS: 100.0000 mg | ORAL_CAPSULE | Freq: Three times a day (TID) | ORAL | Status: DC

## 2013-07-06 NOTE — Progress Notes (Signed)
Patient ID: KOHANA AMBLE, female   DOB: March 09, 1941, 73 y.o.   MRN: [id number] Patient ID: JONE PANEBIANCO, female   DOB: [birth year], 73 y.o.   MRN: [id number] Patient ID: KEYUNA CUTHRELL, female   DOB: 08/28/1940, 73 y.o.   MRN: [id number]  Psychiatric Assessment Adult  Patient Identification:  ROSANA FARNELL Date of Evaluation:  07/06/2013 Chief Complaint: "I'm feeling much better" History of Chief Complaint:   Chief Complaint  Patient presents with  . Anxiety  . Depression  . Follow-up    Anxiety Symptoms include confusion, nausea, nervous/anxious behavior and suicidal ideas.    Altered Mental Status Associated symptoms include abdominal pain and nausea.   this patient is a 73 year old widowed black female who lives with her son in [place]. She's accompanied by her sister [name] and her son-in-law [name]. I also spoke on the phone to her daughter [name]. The patient used to work in a [employer] but has been retired for several years.  The patient was referred by Dr. Nevada Crane, her primary Dr. Apparently she's not had any history of mental health symptoms until about 2 months ago. She started to have severe stomach pain and has gone to several emergency room this in the local area. She's had 9 emergency room visits in the last couple of months. Her last one was yesterday. She's had 3 abdominal CTs in the last month, and ultrasound and blood work. Nothing significantly wrong has been found.  The patient is on numerous medications for her stomach but they don't seem to help. She was prescribed Lexapro by her primary doctor but she refused to take it.  she is taking a low dose of Xanax  She returns after 6 weeks.. she is doing much better. She is now on Linzness which is helped her stomach. She's sleeping well with the Remeron and has increased her appetite. In fact she's gained 20 pounds back and is up into the 170s now. I actually told her she needs to start watching her  food little bit more. Her spirits are good she's no longer suicidal and has not had any auditory or visual hallucinations. She claims that at times she feels dizzy and fell at home. I warned her about taking Xanax on an empty stomach and also suggested she mentioned these symptoms to Dr. Nevada Crane, her primary physician Review of Systems  Constitutional: Positive for appetite change and unexpected weight change.  Gastrointestinal: Positive for nausea and abdominal pain.  Psychiatric/Behavioral: Positive for suicidal ideas, hallucinations, behavioral problems, confusion and agitation. The patient is nervous/anxious.    Physical Exam not done  Depressive Symptoms: depressed mood, anhedonia, insomnia, psychomotor agitation, difficulty concentrating, hopelessness, suicidal thoughts with specific plan, anxiety, loss of energy/fatigue, weight loss,  (Hypo) Manic Symptoms:   Elevated Mood:  No Irritable Mood:  Yes Grandiosity:  No Distractibility:  No Labiality of Mood:  Yes Delusions:  Yes Hallucinations:  Yes Impulsivity:  No Sexually Inappropriate Behavior:  No Financial Extravagance:  No Flight of Ideas:  No  Anxiety Symptoms: Excessive Worry:  Yes Panic Symptoms:  No Agoraphobia:  No Obsessive Compulsive: No  Symptoms: None, Specific Phobias:  No Social Anxiety:  No  Psychotic Symptoms:  Hallucinations: Yes Visual Delusions:  Yes Paranoia:  No   Ideas of Reference:  No  PTSD Symptoms: Ever had a traumatic exposure:  No Had a traumatic exposure in the last month:  No Re-experiencing: No None Hypervigilance:  No Hyperarousal: No None Avoidance: No None  Traumatic Brain Injury: No   Past Psychiatric History: Diagnosis: None   Hospitalizations: None   Outpatient Care: None   Substance Abuse Care: None   Self-Mutilation: None   Suicidal Attempts: None   Violent Behaviors: None    Past Medical History:   Past Medical History  Diagnosis Date  . Hypertension   .  High cholesterol   . Chronic back pain   . Anxiety   . Shortness of breath      even walking causes sob...   . Arthritis     in back .   Marland Kitchen Pneumonia   . Borderline diabetes   . Chronic abdominal pain   . Chronic constipation   . Chronic nausea   . Noncompliance with medication regimen   . GERD (gastroesophageal reflux disease)    History of Loss of Consciousness:  No Seizure History:  No Cardiac History:  No Allergies:  No Known Allergies Current Medications:  Current Outpatient Prescriptions  Medication Sig Dispense Refill  . ALPRAZolam (XANAX) 0.25 MG tablet Take 1 tablet (0.25 mg total) by mouth 3 (three) times daily.  90 tablet  2  . escitalopram (LEXAPRO) 10 MG tablet Take 1 tablet (10 mg total) by mouth daily.  30 tablet  2  . gabapentin (NEURONTIN) 100 MG capsule Take 1 capsule (100 mg total) by mouth 3 (three) times daily.  90 capsule  2  . Linaclotide (LINZESS) 290 MCG CAPS capsule Take 1 capsule (290 mcg total) by mouth daily.  30 capsule  11  . mirtazapine (REMERON) 7.5 MG tablet Take 1 tablet (7.5 mg total) by mouth at bedtime.  30 tablet  2  . olmesartan-hydrochlorothiazide (BENICAR HCT) 40-25 MG per tablet Take 1 tablet by mouth daily.      Marland Kitchen omeprazole (PRILOSEC) 20 MG capsule Take 20 mg by mouth daily. For acid reflux      . ondansetron (ZOFRAN-ODT) 4 MG disintegrating tablet Take 4 mg by mouth every 8 (eight) hours as needed for nausea.       No current facility-administered medications for this visit.    Previous Psychotropic Medications:  Medication Dose   Xanax   0.25 mg every 8 hours as needed                      Substance Abuse History in the last 12 months: Substance Age of 1st Use Last Use Amount Specific Type  Nicotine      Alcohol      Cannabis      Opiates      Cocaine      Methamphetamines      LSD      Ecstasy      Benzodiazepines      Caffeine      Inhalants      Others:                          Medical Consequences of  Substance Abuse: n/a  Legal Consequences of Substance Abuse: n/a  Family Consequences of Substance Abuse: n/a  Blackouts:  No DT's:  No Withdrawal Symptoms:  No None  Social History: Current Place of Residence: [place] of Birth: Unknown Family Members: Lives with son Marital Status:  Widowed Children:   Sons: 1  Daughters: 1 Relationships:  Education:  [employer] Problems/Performance:  Religious Beliefs/Practices: Unknown History of Abuse: none Pensions consultant; Manufacturing engineer History:  None. Legal  History: None Hobbies/Interests: Church  Family History:   Family History  Problem Relation Age of Onset  . Colon cancer Neg Hx     Mental Status Examination/Evaluation: Objective:  Appearance: Slender elderly black female. She is neatly dressed.     Eye Contact::  Good   Speech:  Normal   Volume:  Decreased  Mood: Good, much improved   Affect:  Euthymic   Thought Process:  Organized   Orientation:  Full (Time, Place, and Person)  Thought Content:  No delusions or hallucinations   Suicidal Thoughts:  No  Homicidal Thoughts:  No  Judgement:  Impaired  Insight:  Lacking  Psychomotor Activity:  Normal   Akathisia:  No  Handed:  Right  AIMS (if indicated):    Assets:  Social Support    Laboratory/X-Ray Psychological Evaluation(s)        Assessment:  Maj. depression, single episode with psychotic features and somatization disorder  AXIS I Major Depression, single episode with psychotic features, and somatization disorder   AXIS II Deferred  AXIS III Past Medical History  Diagnosis Date  . Hypertension   . High cholesterol   . Chronic back pain   . Anxiety   . Shortness of breath      even walking causes sob...   . Arthritis     in back .   Marland Kitchen Pneumonia   . Borderline diabetes   . Chronic abdominal pain   . Chronic constipation   . Chronic nausea   . Noncompliance with medication regimen   . GERD  (gastroesophageal reflux disease)      AXIS IV other psychosocial or environmental problems  AXIS V 11-20 some danger of hurting self or others possible OR occasionally fails to maintain minimal personal hygiene OR gross impairment in communication   Treatment Plan/Recommendations:  Plan of Care: Medication management   Laboratory:    Psychotherapy: She is scheduled to see Maurice Small   Medications: The patient will continue mirtazapine 7.5 mg each bedtime as this helped her sleep. She'll continue Lexapro 10 mg every morning and Xanax 0.25 mg 3 times a day and continue Neurontin 100 mg 3 times a day. All other medications will need to be managed by primary care she is no longer on any narcotics   Routine PRN Medications:  No  Consultations:   Safety Concerns:    Other:  She'll return in 3 months    [name], MD 1/5/201511:49 AM

## 2013-07-20 ENCOUNTER — Ambulatory Visit: Payer: Self-pay | Admitting: Gastroenterology

## 2013-07-20 ENCOUNTER — Encounter: Payer: Self-pay | Admitting: Gastroenterology

## 2013-07-20 ENCOUNTER — Ambulatory Visit (INDEPENDENT_AMBULATORY_CARE_PROVIDER_SITE_OTHER): Payer: Medicare Other | Admitting: Gastroenterology

## 2013-07-20 VITALS — BP 132/64 | HR 80 | Temp 97.6°F | Wt 178.4 lb

## 2013-07-20 DIAGNOSIS — R7989 Other specified abnormal findings of blood chemistry: Secondary | ICD-10-CM

## 2013-07-20 DIAGNOSIS — R109 Unspecified abdominal pain: Secondary | ICD-10-CM

## 2013-07-20 DIAGNOSIS — R945 Abnormal results of liver function studies: Secondary | ICD-10-CM

## 2013-07-20 DIAGNOSIS — K219 Gastro-esophageal reflux disease without esophagitis: Secondary | ICD-10-CM | POA: Insufficient documentation

## 2013-07-20 DIAGNOSIS — K5900 Constipation, unspecified: Secondary | ICD-10-CM

## 2013-07-20 MED ORDER — OMEPRAZOLE 20 MG PO CPDR: 40.0000 mg | DELAYED_RELEASE_CAPSULE | Freq: Two times a day (BID) | ORAL | Status: DC

## 2013-07-20 NOTE — Patient Instructions (Addendum)
1. Please have your blood work done with Dr. Juel Burrow blood work. You must take our orders we you go to lab. 2. Office visit in 6 months or sooner if needed. 3. Please see Dr. Nevada Crane for your dizziness and shortness of breath.

## 2013-07-20 NOTE — Progress Notes (Signed)
cc'd to pcp 

## 2013-07-20 NOTE — Progress Notes (Signed)
Primary Care Physician: Delphina Cahill, MD  Primary Gastroenterologist:  Garfield Cornea, MD   Chief Complaint  Patient presents with  . Follow-up    HPI: Audrey Johnson is a 73 y.o. female here for f/u. Last seen in 04/2013. History of chronic abdominal pain with extensive evaluation including CT negative for mesenteric ischemi in 11/2012 (Dr. Thornton Papas did not recommend CTA given CELIAC, SMA, IMA all appeared patent on CT, EGD and colonoscopy (hyperplastic polyp, negative for H. pylori, erosive reflux esophagitis), CT (multiple), MRI of the spine (severe multilevel lumbar spondylosis, stenosis most pronounced at L4-L5 but unchanged disc extrusion, similar to October 2013 exam). Last CT abdomen pelvis with contrast was in October. Minimal right-sided hydronephrosis of uncertain significance, similar to prior study. She was treated for h.pylori positive serologies by PCP last year, Prevpac.  Her weight is up 26 pounds. She has chronic abdominal pain with significant psychiatric issues. Significant functional overlay. Has chronic constipation and gerd. Appetite is good. Feeling better. Less abdominal pain. Linzess daily. BM 1-2 daily. Very little loose stool. No melena, brbpr. No heartburn. No dysphagia. C/o dizziness, prone to falling. Gets sob with exertion for several months. Sees Dr. Nevada Crane in March. No chest pain. No coughing or fever.    Abnormal LFTs in 04/2013, unclear etiology. Current Outpatient Prescriptions  Medication Sig Dispense Refill  . ALPRAZolam (XANAX) 0.25 MG tablet Take 1 tablet (0.25 mg total) by mouth 3 (three) times daily.  90 tablet  2  . escitalopram (LEXAPRO) 10 MG tablet Take 1 tablet (10 mg total) by mouth daily.  30 tablet  2  . gabapentin (NEURONTIN) 100 MG capsule Take 1 capsule (100 mg total) by mouth 3 (three) times daily.  90 capsule  2  . Linaclotide (LINZESS) 290 MCG CAPS capsule Take 1 capsule (290 mcg total) by mouth daily.  30 capsule  11  . mirtazapine  (REMERON) 7.5 MG tablet Take 1 tablet (7.5 mg total) by mouth at bedtime.  30 tablet  2  . nebivolol (BYSTOLIC) 5 MG tablet Take 5 mg by mouth daily.      Marland Kitchen omeprazole (PRILOSEC) 20 MG capsule Take 40 mg by mouth 2 (two) times daily before a meal. For acid reflux       No current facility-administered medications for this visit.    Allergies as of 07/20/2013  . (No Known Allergies)   Past Surgical History  Procedure Laterality Date  . Abdominal hysterectomy    . Toe surgery    . Dilation and curettage of uterus    . Breast surgery  unknown (many yrs)    excision  of lump right breast  was per pt "nothing"   . Cardiac catheterization    . Colonoscopy  04/16/2007    RMR: Normal rectum and colon  . Colonoscopy with esophagogastroduodenoscopy (egd) N/A 01/08/2013    XTG:GYIR erosive reflux esophagitis. Negative H.pylori. Small hiatal hernia-s/p bx (gastritis)-TCS/Colonic polyp-hyperplastic    ROS:  General: Negative for anorexia, weight loss, fever, chills, fatigue, weakness. ENT: Negative for hoarseness, difficulty swallowing , nasal congestion. CV: Negative for chest pain, angina, palpitations,  peripheral edema. See hpi Respiratory: Negative for dyspnea at rest, cough, sputum. C/o DOE, wheezing.  GI: See history of present illness. GU:  Negative for dysuria, hematuria, urinary incontinence, urinary frequency, nocturnal urination.  Endo: Negative for unusual weight change.    Physical Examination:   BP 132/64  Pulse 80  Temp(Src) 97.6 F (36.4 C) (Oral)  Wt 178 lb 6.4 oz (80.922 kg)  General: Well-nourished, well-developed in no acute distress.  Eyes: No icterus. Mouth: Oropharyngeal mucosa moist and pink , no lesions erythema or exudate. Lungs: Clear to auscultation bilaterally.  Heart: Regular rate and rhythm, no murmurs rubs or gallops.  Abdomen: Bowel sounds are normal, nontender, nondistended, no hepatosplenomegaly or masses, no abdominal bruits or hernia , no rebound  or guarding.   Extremities: No lower extremity edema. No clubbing or deformities. Neuro: Alert and oriented x 4   Skin: Warm and dry, no jaundice.   Psych: Alert and cooperative, normal mood and affect.  Labs:  Lab Results  Component Value Date   ALT 103* 04/21/2013   AST 56* 04/21/2013   ALKPHOS 84 04/21/2013   BILITOT 0.7 04/21/2013    Imaging Studies: No results found.

## 2013-07-20 NOTE — Assessment & Plan Note (Signed)
Chronic abdominal pain mostly lower abdomen to left flank. Feels 100% better, with only rare pain. She has gained over 20 pounds since her last visit. Regular BMs on Linzess. Extensive history of back issues as noted but no plans for immediate surgery since no significant changes in couple of years. She did not f/u with urologist regarding mild right hydronephrosis per her PCP recommendations. Overall she is much improved.  1. Continue omeprazole BID, new RX given. 2. Continue Linzess daily. 3. OV in six months. 4. For abnormal LFTs, recheck with her next PCP labs. 5. F/u with PCP regarding dizziness, SOB. Patient voiced understanding.

## 2013-08-03 ENCOUNTER — Other Ambulatory Visit (HOSPITAL_COMMUNITY): Payer: Self-pay | Admitting: Psychiatry

## 2013-08-04 ENCOUNTER — Telehealth: Payer: Self-pay | Admitting: Internal Medicine

## 2013-08-04 NOTE — Telephone Encounter (Signed)
[name] (pt's daughter) called about a Omeprazole prescription that [facility] had written for her. She is saying that Dr Nevada Crane has her on the same medicine but its 40 mg. [facility] [place] and has questions about the prescription. Please call her back at [phone]

## 2013-08-05 NOTE — Telephone Encounter (Signed)
pts daughter is aware. 

## 2013-08-05 NOTE — Telephone Encounter (Signed)
I would suggest she try omeprazole 20mg  BID and if she does ok then continue the lower dose. We need to try and get her on the lowest dose possible for longterm use.  If she feels like she has recurrent symptoms of upper abdominal pain, Nausea, heartburn, then she can take 40mg  BID.   Sorry for the confusion, but the RX was entered as a 20mg  by staff but taking two pills twice per day for the total of 40mg  BID.

## 2013-08-05 NOTE — Telephone Encounter (Signed)
Pt was taking omeprazole 40mg  bid from Dr. Nevada Crane. Her rx that was sent to the pharmacy was omeprazole 20mg  bid. Which should pt be taking?

## 2013-08-10 ENCOUNTER — Telehealth: Payer: Self-pay | Admitting: Internal Medicine

## 2013-08-10 NOTE — Telephone Encounter (Signed)
Pt called late afternoon to say that she is out of her medicine and does not know the name of her medicine and we need to [facility] [place] [facility]. I told her that I didn't know if they would get to it today or not being that we close in a few minutes, but I would let them know.

## 2013-08-12 NOTE — Telephone Encounter (Signed)
Tried to call pt- Audrey Johnson, all medications we order have already been refilled.

## 2013-08-13 NOTE — Telephone Encounter (Signed)
Tried to call pt- LMOM 

## 2013-08-20 ENCOUNTER — Other Ambulatory Visit (HOSPITAL_COMMUNITY): Payer: Self-pay | Admitting: Psychiatry

## 2013-08-21 ENCOUNTER — Telehealth (HOSPITAL_COMMUNITY): Payer: Self-pay | Admitting: *Deleted

## 2013-08-24 ENCOUNTER — Telehealth (HOSPITAL_COMMUNITY): Payer: Self-pay | Admitting: *Deleted

## 2013-08-25 NOTE — Telephone Encounter (Signed)
done

## 2013-09-08 ENCOUNTER — Telehealth (HOSPITAL_COMMUNITY): Payer: Self-pay | Admitting: *Deleted

## 2013-09-08 ENCOUNTER — Other Ambulatory Visit (HOSPITAL_COMMUNITY): Payer: Self-pay | Admitting: Psychiatry

## 2013-09-08 DIAGNOSIS — E119 Type 2 diabetes mellitus without complications: Secondary | ICD-10-CM | POA: Diagnosis not present

## 2013-09-08 DIAGNOSIS — I10 Essential (primary) hypertension: Secondary | ICD-10-CM | POA: Diagnosis not present

## 2013-09-08 DIAGNOSIS — E039 Hypothyroidism, unspecified: Secondary | ICD-10-CM | POA: Diagnosis not present

## 2013-09-08 DIAGNOSIS — E785 Hyperlipidemia, unspecified: Secondary | ICD-10-CM | POA: Diagnosis not present

## 2013-09-08 MED ORDER — MIRTAZAPINE 7.5 MG PO TABS: 7.5000 mg | ORAL_TABLET | Freq: Every day | ORAL | Status: DC

## 2013-09-08 NOTE — Telephone Encounter (Signed)
done

## 2013-09-10 DIAGNOSIS — K219 Gastro-esophageal reflux disease without esophagitis: Secondary | ICD-10-CM | POA: Diagnosis not present

## 2013-09-10 DIAGNOSIS — R0602 Shortness of breath: Secondary | ICD-10-CM | POA: Diagnosis not present

## 2013-09-10 DIAGNOSIS — I10 Essential (primary) hypertension: Secondary | ICD-10-CM | POA: Diagnosis not present

## 2013-10-03 ENCOUNTER — Other Ambulatory Visit (HOSPITAL_COMMUNITY): Payer: Self-pay | Admitting: Psychiatry

## 2013-10-05 ENCOUNTER — Telehealth (HOSPITAL_COMMUNITY): Payer: Self-pay | Admitting: *Deleted

## 2013-10-05 ENCOUNTER — Other Ambulatory Visit (HOSPITAL_COMMUNITY): Payer: Self-pay | Admitting: Psychiatry

## 2013-10-05 MED ORDER — ALPRAZOLAM 0.25 MG PO TABS: 0.2500 mg | ORAL_TABLET | Freq: Three times a day (TID) | ORAL | Status: DC

## 2013-10-05 NOTE — Telephone Encounter (Signed)
printed

## 2013-10-06 ENCOUNTER — Ambulatory Visit (INDEPENDENT_AMBULATORY_CARE_PROVIDER_SITE_OTHER): Payer: Medicare Other | Admitting: Psychiatry

## 2013-10-06 ENCOUNTER — Encounter (HOSPITAL_COMMUNITY): Payer: Self-pay | Admitting: Psychiatry

## 2013-10-06 VITALS — BP 140/80 | Ht 63.0 in | Wt 194.0 lb

## 2013-10-06 DIAGNOSIS — F323 Major depressive disorder, single episode, severe with psychotic features: Secondary | ICD-10-CM | POA: Diagnosis not present

## 2013-10-06 DIAGNOSIS — F459 Somatoform disorder, unspecified: Secondary | ICD-10-CM | POA: Diagnosis not present

## 2013-10-06 DIAGNOSIS — F329 Major depressive disorder, single episode, unspecified: Secondary | ICD-10-CM

## 2013-10-06 MED ORDER — GABAPENTIN 100 MG PO CAPS: 100.0000 mg | ORAL_CAPSULE | Freq: Three times a day (TID) | ORAL | Status: DC

## 2013-10-06 MED ORDER — ESCITALOPRAM OXALATE 10 MG PO TABS: 10.0000 mg | ORAL_TABLET | Freq: Every day | ORAL | Status: DC

## 2013-10-06 MED ORDER — MIRTAZAPINE 7.5 MG PO TABS: 7.5000 mg | ORAL_TABLET | Freq: Every day | ORAL | Status: DC

## 2013-10-06 NOTE — Progress Notes (Signed)
Patient ID: Audrey Johnson, female   DOB: 11-20-1940, 73 y.o.   MRN: [id number] Patient ID: Audrey Johnson, female   DOB: 1941/03/19, 73 y.o.   MRN: [id number] Patient ID: Audrey Johnson, female   DOB: 11-29-1940, 73 y.o.   MRN: [id number] Patient ID: Audrey Johnson, female   DOB: Jan 10, 1941, 73 y.o.   MRN: [id number]  Psychiatric Assessment Adult  Patient Identification:  Audrey Johnson Date of Evaluation:  10/06/2013 Chief Complaint: "I'm feeling much better" History of Chief Complaint:   Chief Complaint  Patient presents with  . Anxiety  . Depression  . Follow-up    Anxiety Symptoms include confusion, nausea, nervous/anxious behavior and suicidal ideas.    Altered Mental Status Associated symptoms include abdominal pain and nausea.   this patient is a 73 year old widowed black female who lives with her son in [place]. She's accompanied by her sister Audrey Johnson and her son-in-law Audrey Johnson. I also spoke on the phone to her daughter Audrey Johnson. The patient used to work in a [employer] but has been retired for several years.  The patient was referred by Dr. Nevada Crane, her primary Dr. Apparently she's not had any history of mental health symptoms until about 2 months ago. She started to have severe stomach pain and has gone to several emergency room this in the local area. She's had 9 emergency room visits in the last couple of months. Her last one was yesterday. She's had 3 abdominal CTs in the last month, and ultrasound and blood work. Nothing significantly wrong has been found.  The patient is on numerous medications for her stomach but they don't seem to help. She was prescribed Lexapro by her primary doctor but she refused to take it.  she is taking a low dose of Xanax  She returns after 3 months area she is doing very well. Her mood has improved. Her energy is good and she's getting out and doing errands. She's even thinking about getting a part-time job. She denies insomnia  and her appetite is excellent. In in fact she has probably gained too much weight now she denies any psychotic symptoms Review of Systems  Constitutional: Positive for appetite change and unexpected weight change.  Gastrointestinal: Positive for nausea and abdominal pain.  Psychiatric/Behavioral: Positive for suicidal ideas, hallucinations, behavioral problems, confusion and agitation. The patient is nervous/anxious.    Physical Exam not done  Depressive Symptoms: depressed mood, anhedonia, insomnia, psychomotor agitation, difficulty concentrating, hopelessness, suicidal thoughts with specific plan, anxiety, loss of energy/fatigue, weight loss,  (Hypo) Manic Symptoms:   Elevated Mood:  No Irritable Mood:  Yes Grandiosity:  No Distractibility:  No Labiality of Mood:  Yes Delusions:  Yes Hallucinations:  Yes Impulsivity:  No Sexually Inappropriate Behavior:  No Financial Extravagance:  No Flight of Ideas:  No  Anxiety Symptoms: Excessive Worry:  Yes Panic Symptoms:  No Agoraphobia:  No Obsessive Compulsive: No  Symptoms: None, Specific Phobias:  No Social Anxiety:  No  Psychotic Symptoms:  Hallucinations: Yes Visual Delusions:  Yes Paranoia:  No   Ideas of Reference:  No  PTSD Symptoms: Ever had a traumatic exposure:  No Had a traumatic exposure in the last month:  No Re-experiencing: No None Hypervigilance:  No Hyperarousal: No None Avoidance: No None  Traumatic Brain Injury: No   Past Psychiatric History: Diagnosis: None   Hospitalizations: None   Outpatient Care: None   Substance Abuse Care: None   Self-Mutilation: None   Suicidal Attempts: None  Violent Behaviors: None    Past Medical History:   Past Medical History  Diagnosis Date  . Hypertension   . High cholesterol   . Chronic back pain   . Anxiety   . Shortness of breath      even walking causes sob...   . Arthritis     in back .   Marland Kitchen Pneumonia   . Borderline diabetes   . Chronic  abdominal pain   . Chronic constipation   . Chronic nausea   . Noncompliance with medication regimen   . GERD (gastroesophageal reflux disease)    History of Loss of Consciousness:  No Seizure History:  No Cardiac History:  No Allergies:  No Known Allergies Current Medications:  Current Outpatient Prescriptions  Medication Sig Dispense Refill  . ALPRAZolam (XANAX) 0.25 MG tablet Take 1 tablet (0.25 mg total) by mouth 3 (three) times daily.  90 tablet  2  . escitalopram (LEXAPRO) 10 MG tablet Take 1 tablet (10 mg total) by mouth daily.  30 tablet  2  . gabapentin (NEURONTIN) 100 MG capsule Take 1 capsule (100 mg total) by mouth 3 (three) times daily.  90 capsule  2  . Linaclotide (LINZESS) 290 MCG CAPS capsule Take 1 capsule (290 mcg total) by mouth daily.  30 capsule  11  . mirtazapine (REMERON) 7.5 MG tablet Take 1 tablet (7.5 mg total) by mouth at bedtime.  30 tablet  2  . nebivolol (BYSTOLIC) 5 MG tablet Take 5 mg by mouth daily.      Marland Kitchen omeprazole (PRILOSEC) 20 MG capsule Take 2 capsules (40 mg total) by mouth 2 (two) times daily before a meal. For acid reflux  60 capsule  5   No current facility-administered medications for this visit.    Previous Psychotropic Medications:  Medication Dose   Xanax   0.25 mg every 8 hours as needed                      Substance Abuse History in the last 12 months: Substance Age of 1st Use Last Use Amount Specific Type  Nicotine      Alcohol      Cannabis      Opiates      Cocaine      Methamphetamines      LSD      Ecstasy      Benzodiazepines      Caffeine      Inhalants      Others:                          Medical Consequences of Substance Abuse: n/a  Legal Consequences of Substance Abuse: n/a  Family Consequences of Substance Abuse: n/a  Blackouts:  No DT's:  No Withdrawal Symptoms:  No None  Social History: Current Place of Residence: [place] of Birth: Unknown Family Members: Lives  with son Marital Status:  Widowed Children:   Sons: 1  Daughters: 1 Relationships:  Education:  [employer] Problems/Performance:  Religious Beliefs/Practices: Unknown History of Abuse: none Pensions consultant; Manufacturing engineer History:  None. Legal History: None Hobbies/Interests: Church  Family History:   Family History  Problem Relation Age of Onset  . Colon cancer Neg Hx     Mental Status Examination/Evaluation: Objective:  Appearance:   She is neatly dressed, pleasant    Eye Contact::  Good   Speech:  Normal  Volume:  Decreased  Mood: Good, much improved   Affect:  Euthymic   Thought Process:  Organized   Orientation:  Full (Time, Place, and Person)  Thought Content:  No delusions or hallucinations   Suicidal Thoughts:  No  Homicidal Thoughts:  No  Judgement:  Impaired  Insight:  Lacking  Psychomotor Activity:  Normal   Akathisia:  No  Handed:  Right  AIMS (if indicated):    Assets:  Social Support    Laboratory/X-Ray Psychological Evaluation(s)        Assessment:  Maj. depression, single episode with psychotic features and somatization disorder  AXIS I Major Depression, single episode with psychotic features, and somatization disorder   AXIS II Deferred  AXIS III Past Medical History  Diagnosis Date  . Hypertension   . High cholesterol   . Chronic back pain   . Anxiety   . Shortness of breath      even walking causes sob...   . Arthritis     in back .   Marland Kitchen Pneumonia   . Borderline diabetes   . Chronic abdominal pain   . Chronic constipation   . Chronic nausea   . Noncompliance with medication regimen   . GERD (gastroesophageal reflux disease)      AXIS IV other psychosocial or environmental problems  AXIS V 11-20 some danger of hurting self or others possible OR occasionally fails to maintain minimal personal hygiene OR gross impairment in communication   Treatment Plan/Recommendations:  Plan of Care: Medication  management   Laboratory:    Psychotherapy: She is scheduled to see Maurice Small   Medications: The patient will continue mirtazapine 7.5 mg each bedtime as this helped her sleep. She'll continue Lexapro 10 mg every morning and Xanax 0.25 mg 3 times a day and continue Neurontin 100 mg 3 times a day. All other medications will need to be managed by primary care she is no longer on any narcotics   Routine PRN Medications:  No  Consultations:   Safety Concerns:    Other:  She'll return in 3 months    [name], MD 4/7/201511:04 AM

## 2013-10-09 ENCOUNTER — Telehealth: Payer: Self-pay | Admitting: Gastroenterology

## 2013-10-09 NOTE — Telephone Encounter (Signed)
Please remind patient to have labs done.

## 2013-10-09 NOTE — Telephone Encounter (Signed)
Mailed Pt letter about getting her blood work done.

## 2013-10-23 NOTE — Telephone Encounter (Signed)
Please let patient know I reviewed her labs done for PCP last month.   Her LFTs are normal! No further w/u needed at this time.   OV in 12/2013.

## 2013-10-28 ENCOUNTER — Telehealth (HOSPITAL_COMMUNITY): Payer: Self-pay | Admitting: *Deleted

## 2013-10-28 ENCOUNTER — Other Ambulatory Visit (HOSPITAL_COMMUNITY): Payer: Self-pay | Admitting: Psychiatry

## 2013-10-28 MED ORDER — GABAPENTIN 100 MG PO CAPS: 100.0000 mg | ORAL_CAPSULE | Freq: Three times a day (TID) | ORAL | Status: DC

## 2013-10-28 NOTE — Telephone Encounter (Signed)
reordered

## 2013-11-02 NOTE — Telephone Encounter (Signed)
Pt is aware  Reminder in computer for July

## 2013-11-25 ENCOUNTER — Other Ambulatory Visit (HOSPITAL_COMMUNITY): Payer: Self-pay | Admitting: Psychiatry

## 2013-11-26 ENCOUNTER — Other Ambulatory Visit (HOSPITAL_COMMUNITY): Payer: Self-pay | Admitting: Psychiatry

## 2013-11-26 ENCOUNTER — Telehealth (HOSPITAL_COMMUNITY): Payer: Self-pay | Admitting: *Deleted

## 2013-11-26 MED ORDER — ESCITALOPRAM OXALATE 10 MG PO TABS: 10.0000 mg | ORAL_TABLET | Freq: Every day | ORAL | Status: DC

## 2013-12-18 ENCOUNTER — Emergency Department (HOSPITAL_COMMUNITY): Payer: Medicare Other

## 2013-12-18 ENCOUNTER — Encounter (HOSPITAL_COMMUNITY): Payer: Self-pay | Admitting: Emergency Medicine

## 2013-12-18 ENCOUNTER — Emergency Department (HOSPITAL_COMMUNITY)
Admission: EM | Admit: 2013-12-18 | Discharge: 2013-12-18 | Disposition: A | Discharge status: Home / Self Care | Payer: Medicare Other | Attending: Emergency Medicine | Admitting: Emergency Medicine

## 2013-12-18 DIAGNOSIS — R296 Repeated falls: Secondary | ICD-10-CM | POA: Insufficient documentation

## 2013-12-18 DIAGNOSIS — S99919A Unspecified injury of unspecified ankle, initial encounter: Principal | ICD-10-CM

## 2013-12-18 DIAGNOSIS — Y939 Activity, unspecified: Secondary | ICD-10-CM | POA: Insufficient documentation

## 2013-12-18 DIAGNOSIS — F411 Generalized anxiety disorder: Secondary | ICD-10-CM | POA: Insufficient documentation

## 2013-12-18 DIAGNOSIS — M129 Arthropathy, unspecified: Secondary | ICD-10-CM | POA: Diagnosis not present

## 2013-12-18 DIAGNOSIS — K219 Gastro-esophageal reflux disease without esophagitis: Secondary | ICD-10-CM | POA: Diagnosis not present

## 2013-12-18 DIAGNOSIS — S99929A Unspecified injury of unspecified foot, initial encounter: Secondary | ICD-10-CM | POA: Diagnosis not present

## 2013-12-18 DIAGNOSIS — Z862 Personal history of diseases of the blood and blood-forming organs and certain disorders involving the immune mechanism: Secondary | ICD-10-CM | POA: Insufficient documentation

## 2013-12-18 DIAGNOSIS — G8929 Other chronic pain: Secondary | ICD-10-CM | POA: Diagnosis not present

## 2013-12-18 DIAGNOSIS — Z8639 Personal history of other endocrine, nutritional and metabolic disease: Secondary | ICD-10-CM | POA: Insufficient documentation

## 2013-12-18 DIAGNOSIS — Z8701 Personal history of pneumonia (recurrent): Secondary | ICD-10-CM | POA: Diagnosis not present

## 2013-12-18 DIAGNOSIS — I10 Essential (primary) hypertension: Secondary | ICD-10-CM | POA: Insufficient documentation

## 2013-12-18 DIAGNOSIS — Y929 Unspecified place or not applicable: Secondary | ICD-10-CM | POA: Insufficient documentation

## 2013-12-18 DIAGNOSIS — Z79899 Other long term (current) drug therapy: Secondary | ICD-10-CM | POA: Diagnosis not present

## 2013-12-18 DIAGNOSIS — S8990XA Unspecified injury of unspecified lower leg, initial encounter: Secondary | ICD-10-CM | POA: Insufficient documentation

## 2013-12-18 DIAGNOSIS — M25569 Pain in unspecified knee: Secondary | ICD-10-CM | POA: Diagnosis not present

## 2013-12-18 DIAGNOSIS — Z791 Long term (current) use of non-steroidal anti-inflammatories (NSAID): Secondary | ICD-10-CM | POA: Diagnosis not present

## 2013-12-18 DIAGNOSIS — Z23 Encounter for immunization: Secondary | ICD-10-CM | POA: Insufficient documentation

## 2013-12-18 DIAGNOSIS — M25561 Pain in right knee: Secondary | ICD-10-CM

## 2013-12-18 MED ORDER — NAPROXEN 500 MG PO TABS: 500.0000 mg | ORAL_TABLET | Freq: Two times a day (BID) | ORAL | Status: DC

## 2013-12-18 NOTE — ED Notes (Signed)
Xray called about results delay. Xray did not cross over. Xray brought print out over to ED of the results. MD notified and given print out of results.

## 2013-12-18 NOTE — ED Notes (Signed)
Pt c/o chronic r knee pain.  Reports knee "gives out" and causes her to fall.  Reports fell yesterday.

## 2013-12-18 NOTE — ED Provider Notes (Signed)
CSN: [id number]     Arrival date & time 12/18/13  5462 History  This chart was scribed for Nat Christen, MD by Ludger Nutting, ED Scribe. This patient was seen in room APA19/APA19 and the patient's care was started 7:54 AM.    Chief Complaint  Patient presents with  . Knee Pain      The history is provided by the patient. No language interpreter was used.    HPI Comments: Audrey Johnson is a 73 y.o. female who presents to the Emergency Department complaining of several months of chronic right knee pain and swelling that worsened over the last few days. Patient states recently her right knee has been buckling, causing her to fall. She denies weakness, numbness. Severity is mild to moderate. Position and palpation makes symptoms worse  PCP [place] Crane Past Medical History  Diagnosis Date  . Hypertension   . High cholesterol   . Chronic back pain   . Anxiety   . Shortness of breath      even walking causes sob...   . Arthritis     in back .   Marland Kitchen Pneumonia   . Borderline diabetes   . Chronic abdominal pain   . Chronic constipation   . Chronic nausea   . Noncompliance with medication regimen   . GERD (gastroesophageal reflux disease)    Past Surgical History  Procedure Laterality Date  . Abdominal hysterectomy    . Toe surgery    . Dilation and curettage of uterus    . Breast surgery  unknown (many yrs)    excision  of lump right breast  was per pt "nothing"   . Cardiac catheterization    . Colonoscopy  04/16/2007    RMR: Normal rectum and colon  . Colonoscopy with esophagogastroduodenoscopy (egd) N/A 01/08/2013    VOJ:JKKX erosive reflux esophagitis. Negative H.pylori. Small hiatal hernia-s/p bx (gastritis)-TCS/Colonic polyp-hyperplastic   Family History  Problem Relation Age of Onset  . Colon cancer Neg Hx    History  Substance Use Topics  . Smoking status: Never Smoker   . Smokeless tobacco: Not on file  . Alcohol Use: No   OB History   Grav Para Term Preterm Abortions  TAB SAB Ect Mult Living                 Review of Systems  A complete 10 system review of systems was obtained and all systems are negative except as noted in the HPI and PMH.    Allergies  Review of patient's allergies indicates no known allergies.  Home Medications   Prior to Admission medications   Medication Sig Start Date End Date Taking? Authorizing Provider  ALPRAZolam (XANAX) 0.25 MG tablet Take 1 tablet (0.25 mg total) by mouth 3 (three) times daily. 10/05/13  Yes [name], MD  escitalopram (LEXAPRO) 10 MG tablet Take 1 tablet (10 mg total) by mouth daily. 11/26/13  Yes [name], MD  gabapentin (NEURONTIN) 100 MG capsule Take 1 capsule (100 mg total) by mouth 3 (three) times daily. 10/28/13 10/28/14 Yes [name], MD  Linaclotide [facility]) 290 MCG CAPS capsule Take 1 capsule (290 mcg total) by mouth daily. 05/01/13  Yes Daneil Dolin, MD  mirtazapine (REMERON) 7.5 MG tablet Take 1 tablet (7.5 mg total) by mouth at bedtime. 10/06/13  Yes [name], MD  nebivolol (BYSTOLIC) 5 MG tablet Take 5 mg by mouth daily.   Yes Historical Provider, MD  omeprazole (PRILOSEC) 20 MG capsule Take  2 capsules (40 mg total) by mouth 2 (two) times daily before a meal. For acid reflux 07/20/13  Yes Mahala Menghini, PA-C  naproxen (NAPROSYN) 500 MG tablet Take 1 tablet (500 mg total) by mouth 2 (two) times daily. 12/18/13   Nat Christen, MD   BP 143/65  Pulse 81  Temp(Src) 98.8 F (37.1 C) (Oral)  Resp 20  Ht 5\' 7"  (1.702 m)  Wt 182 lb (82.555 kg)  BMI 28.50 kg/m2  SpO2 98% Physical Exam  Nursing note and vitals reviewed. Constitutional: She is oriented to person, place, and time. She appears well-developed and well-nourished.  HENT:  Head: Normocephalic and atraumatic.  Eyes: Conjunctivae and EOM are normal.  Neck: Normal range of motion. Neck supple.  Cardiovascular: Normal rate.   Pulmonary/Chest: Effort normal.  Musculoskeletal: She exhibits edema and tenderness.  Tenderness to  superior, lateral part of right knee. Right knee is edematous. Pain with ROM  Neurological: She is alert and oriented to person, place, and time.  Skin: Skin is warm and dry.  Psychiatric: She has a normal mood and affect. Her behavior is normal.    ED Course  Procedures (including critical care time)  DIAGNOSTIC STUDIES: Oxygen Saturation is 98% on RA, normal by my interpretation.    COORDINATION OF CARE: 7:59 AM Will order XRAY of left knee. Discussed treatment plan with pt at bedside and pt agreed to plan.   Labs Review Labs Reviewed - No data to display  Imaging Review Dg Knee Complete 4 Views Right  12/18/2013   CLINICAL DATA:  Pain post trauma  EXAM: RIGHT KNEE - COMPLETE 4+ VIEW  COMPARISON:  October 14, 2008  FINDINGS: Frontal, lateral, and bilateral oblique views were obtained. There is no fracture or dislocation. There is a small joint effusion. There is moderate narrowing laterally, progressed from prior study. There is slight narrowing of the patellofemoral joint. There is mild spurring in all compartments.  IMPRESSION: Osteoarthritic change, progressed laterally since prior study. Small joint effusion.   Electronically Signed   By: Lowella Grip M.D.   On: 12/18/2013 08:56     EKG Interpretation None      MDM   Final diagnoses:  Right knee pain    Symptoms have persisted for several months. X-ray shows osteoarthritis and slight effusion.   Ice, elevate, brace, nonsteroidals, referral to orthopedics  I personally performed the services described in this documentation, which was scribed in my presence. The recorded information has been reviewed and is accurate.   Nat Christen, MD 12/18/13 1006

## 2013-12-18 NOTE — ED Notes (Signed)
Patient with no complaints at this time. Respirations even and unlabored. Skin warm/dry. Discharge instructions reviewed with patient at this time. Patient given opportunity to voice concerns/ask questions. Patient discharged at this time and left Emergency Department with steady gait.   

## 2013-12-18 NOTE — Discharge Instructions (Signed)
X-ray shows arthritis in the knee. Ice, elevate, knee brace, prescription for anti-inflammatory medicine. If not improving, follow up with orthopedic surgeon. Phone number given

## 2014-01-04 ENCOUNTER — Ambulatory Visit (INDEPENDENT_AMBULATORY_CARE_PROVIDER_SITE_OTHER): Payer: Medicare Other | Admitting: Psychiatry

## 2014-01-04 ENCOUNTER — Encounter (HOSPITAL_COMMUNITY): Payer: Self-pay | Admitting: Psychiatry

## 2014-01-04 VITALS — BP 140/70 | Ht 67.0 in | Wt 194.0 lb

## 2014-01-04 DIAGNOSIS — F451 Undifferentiated somatoform disorder: Secondary | ICD-10-CM

## 2014-01-04 DIAGNOSIS — F323 Major depressive disorder, single episode, severe with psychotic features: Secondary | ICD-10-CM

## 2014-01-04 DIAGNOSIS — F329 Major depressive disorder, single episode, unspecified: Secondary | ICD-10-CM

## 2014-01-04 DIAGNOSIS — F32A Depression, unspecified: Secondary | ICD-10-CM

## 2014-01-04 MED ORDER — ALPRAZOLAM 0.25 MG PO TABS: 0.2500 mg | ORAL_TABLET | Freq: Three times a day (TID) | ORAL | Status: DC

## 2014-01-04 MED ORDER — ESCITALOPRAM OXALATE 10 MG PO TABS: 10.0000 mg | ORAL_TABLET | Freq: Every day | ORAL | Status: DC

## 2014-01-04 MED ORDER — MIRTAZAPINE 7.5 MG PO TABS: 7.5000 mg | ORAL_TABLET | Freq: Every day | ORAL | Status: DC

## 2014-01-04 MED ORDER — GABAPENTIN 100 MG PO CAPS: 100.0000 mg | ORAL_CAPSULE | Freq: Three times a day (TID) | ORAL | Status: DC

## 2014-01-04 NOTE — Progress Notes (Signed)
Patient ID: Audrey Johnson, female   DOB: 1941-05-08, 73 y.o.   MRN: [id number] Patient ID: Audrey Johnson, female   DOB: 05-31-1941, 73 y.o.   MRN: [id number] Patient ID: Audrey Johnson, female   DOB: [birth year], 73 y.o.   MRN: [id number] Patient ID: Audrey Johnson, female   DOB: 1940/07/11, 73 y.o.   MRN: [id number] Patient ID: Audrey Johnson, female   DOB: 1941-02-07, 73 y.o.   MRN: [id number]  Psychiatric Assessment Adult  Patient Identification:  Audrey Johnson Date of Evaluation:  01/04/2014 Chief Complaint: "I'm feeling much better" History of Chief Complaint:   Chief Complaint  Patient presents with  . Anxiety  . Depression  . Follow-up    Anxiety Symptoms include confusion, nausea, nervous/anxious behavior and suicidal ideas.    Altered Mental Status Associated symptoms include abdominal pain and nausea.   this patient is a 73 year old widowed black female who lives with her son in [place]. She's accompanied by her sister Audrey Johnson and her son-in-law Audrey Johnson. I also spoke on the phone to her daughter Audrey Johnson. The patient used to work in a [employer] but has been retired for several years.  The patient was referred by Dr. Nevada Crane, her primary Dr. Apparently she's not had any history of mental health symptoms until about 2 months ago. She started to have severe stomach pain and has gone to several emergency room this in the local area. She's had 9 emergency room visits in the last couple of months. Her last one was yesterday. She's had 3 abdominal CTs in the last month, and ultrasound and blood work. Nothing significantly wrong has been found.  The patient is on numerous medications for her stomach but they don't seem to help. She was prescribed Lexapro by her primary doctor but she refused to take it.  she is taking a low dose of Xanax  She returns after 3 months . For the most part she is doing well but her right knee has been hurting and she went to the  emergency room for last month. She still limping somewhat. Her mood is been stable and she is sleeping and eating well. She denies any auditory or visual sedation she sometimes has nightmares. Her energy is good. She states that at times she feels like she has short-term memory loss but I don't think this is secondary to any medications Review of Systems  Constitutional: Positive for appetite change and unexpected weight change.  Gastrointestinal: Positive for nausea and abdominal pain.  Psychiatric/Behavioral: Positive for suicidal ideas, hallucinations, behavioral problems, confusion and agitation. The patient is nervous/anxious.    Physical Exam not done  Depressive Symptoms: depressed mood, anhedonia, insomnia, psychomotor agitation, difficulty concentrating, hopelessness, suicidal thoughts with specific plan, anxiety, loss of energy/fatigue, weight loss,  (Hypo) Manic Symptoms:   Elevated Mood:  No Irritable Mood:  Yes Grandiosity:  No Distractibility:  No Labiality of Mood:  Yes Delusions:  Yes Hallucinations:  Yes Impulsivity:  No Sexually Inappropriate Behavior:  No Financial Extravagance:  No Flight of Ideas:  No  Anxiety Symptoms: Excessive Worry:  Yes Panic Symptoms:  No Agoraphobia:  No Obsessive Compulsive: No  Symptoms: None, Specific Phobias:  No Social Anxiety:  No  Psychotic Symptoms:  Hallucinations: Yes Visual Delusions:  Yes Paranoia:  No   Ideas of Reference:  No  PTSD Symptoms: Ever had a traumatic exposure:  No Had a traumatic exposure in the last month:  No Re-experiencing: No None Hypervigilance:  No Hyperarousal:  No None Avoidance: No None  Traumatic Brain Injury: No   Past Psychiatric History: Diagnosis: None   Hospitalizations: None   Outpatient Care: None   Substance Abuse Care: None   Self-Mutilation: None   Suicidal Attempts: None   Violent Behaviors: None    Past Medical History:   Past Medical History  Diagnosis Date   . Hypertension   . High cholesterol   . Chronic back pain   . Anxiety   . Shortness of breath      even walking causes sob...   . Arthritis     in back .   Marland Kitchen Pneumonia   . Borderline diabetes   . Chronic abdominal pain   . Chronic constipation   . Chronic nausea   . Noncompliance with medication regimen   . GERD (gastroesophageal reflux disease)    History of Loss of Consciousness:  No Seizure History:  No Cardiac History:  No Allergies:  No Known Allergies Current Medications:  Current Outpatient Prescriptions  Medication Sig Dispense Refill  . ALPRAZolam (XANAX) 0.25 MG tablet Take 1 tablet (0.25 mg total) by mouth 3 (three) times daily.  90 tablet  2  . escitalopram (LEXAPRO) 10 MG tablet Take 1 tablet (10 mg total) by mouth daily.  30 tablet  2  . gabapentin (NEURONTIN) 100 MG capsule Take 1 capsule (100 mg total) by mouth 3 (three) times daily.  90 capsule  2  . Linaclotide (LINZESS) 290 MCG CAPS capsule Take 1 capsule (290 mcg total) by mouth daily.  30 capsule  11  . mirtazapine (REMERON) 7.5 MG tablet Take 1 tablet (7.5 mg total) by mouth at bedtime.  30 tablet  2  . naproxen (NAPROSYN) 500 MG tablet Take 1 tablet (500 mg total) by mouth 2 (two) times daily.  20 tablet  0  . nebivolol (BYSTOLIC) 5 MG tablet Take 5 mg by mouth daily.      Marland Kitchen omeprazole (PRILOSEC) 20 MG capsule Take 2 capsules (40 mg total) by mouth 2 (two) times daily before a meal. For acid reflux  60 capsule  5   No current facility-administered medications for this visit.    Previous Psychotropic Medications:  Medication Dose   Xanax   0.25 mg every 8 hours as needed                      Substance Abuse History in the last 12 months: Substance Age of 1st Use Last Use Amount Specific Type  Nicotine      Alcohol      Cannabis      Opiates      Cocaine      Methamphetamines      LSD      Ecstasy      Benzodiazepines      Caffeine      Inhalants      Others:                           Medical Consequences of Substance Abuse: n/a  Legal Consequences of Substance Abuse: n/a  Family Consequences of Substance Abuse: n/a  Blackouts:  No DT's:  No Withdrawal Symptoms:  No None  Social History: Current Place of Residence: Winter of Birth: Unknown Family Members: Lives with son Marital Status:  Widowed Children:   Sons: 1  Daughters: 1 Relationships:  Education:  [employer] Problems/Performance:  Religious Beliefs/Practices: Unknown  History of Abuse: none Occupational Experiences; Manufacturing engineer History:  None. Legal History: None Hobbies/Interests: Church  Family History:   Family History  Problem Relation Age of Onset  . Colon cancer Neg Hx     Mental Status Examination/Evaluation: Objective:  Appearance:   She is neatly dressed, pleasant    Eye Contact::  Good   Speech:  Normal   Volume:  Decreased  Mood: Good, much improved   Affect:  Euthymic   Thought Process:  Organized   Orientation:  Full (Time, Place, and Person)  Thought Content:  No delusions or hallucinations   Suicidal Thoughts:  No  Homicidal Thoughts:  No  Judgement:  Impaired  Insight:  Lacking  Psychomotor Activity:  Normal   Akathisia:  No  Handed:  Right  AIMS (if indicated):    Assets:  Social Support    Laboratory/X-Ray Psychological Evaluation(s)        Assessment:  Maj. depression, single episode with psychotic features and somatization disorder  AXIS I Major Depression, single episode with psychotic features, and somatization disorder   AXIS II Deferred  AXIS III Past Medical History  Diagnosis Date  . Hypertension   . High cholesterol   . Chronic back pain   . Anxiety   . Shortness of breath      even walking causes sob...   . Arthritis     in back .   Marland Kitchen Pneumonia   . Borderline diabetes   . Chronic abdominal pain   . Chronic constipation   . Chronic nausea   . Noncompliance with medication regimen   .  GERD (gastroesophageal reflux disease)      AXIS IV other psychosocial or environmental problems  AXIS V 11-20 some danger of hurting self or others possible OR occasionally fails to maintain minimal personal hygiene OR gross impairment in communication   Treatment Plan/Recommendations:  Plan of Care: Medication management   Laboratory:    Psychotherapy: She is scheduled to see Maurice Small   Medications: The patient will continue mirtazapine 7.5 mg each bedtime as this helped her sleep. She'll continue Lexapro 10 mg every morning and Xanax 0.25 mg 3 times a day and continue Neurontin 100 mg 3 times a day. All other medications will need to be managed by primary care she is no longer on any narcotics   Routine PRN Medications:  No  Consultations:   Safety Concerns:    Other:  She'll return in 3 months    [name], MD 7/6/20153:16 PM

## 2014-01-05 ENCOUNTER — Ambulatory Visit (HOSPITAL_COMMUNITY): Payer: Self-pay | Admitting: Psychiatry

## 2014-01-27 ENCOUNTER — Telehealth (HOSPITAL_COMMUNITY): Payer: Self-pay

## 2014-01-27 ENCOUNTER — Encounter: Payer: Self-pay | Admitting: Internal Medicine

## 2014-01-27 NOTE — Telephone Encounter (Signed)
[facility] filled for pt 7/6.Waiting to be picked up Contacted patient - informed her prescription ready to pick up

## 2014-02-02 ENCOUNTER — Other Ambulatory Visit (HOSPITAL_COMMUNITY): Payer: Self-pay | Admitting: Internal Medicine

## 2014-02-02 DIAGNOSIS — Z1231 Encounter for screening mammogram for malignant neoplasm of breast: Secondary | ICD-10-CM

## 2014-02-24 ENCOUNTER — Ambulatory Visit (HOSPITAL_COMMUNITY): Payer: Self-pay

## 2014-03-05 ENCOUNTER — Ambulatory Visit (INDEPENDENT_AMBULATORY_CARE_PROVIDER_SITE_OTHER): Payer: Medicare Other | Admitting: Psychiatry

## 2014-03-05 ENCOUNTER — Encounter (HOSPITAL_COMMUNITY): Payer: Self-pay | Admitting: Psychiatry

## 2014-03-05 VITALS — BP 141/76 | HR 68 | Ht 67.0 in | Wt 179.8 lb

## 2014-03-05 DIAGNOSIS — F323 Major depressive disorder, single episode, severe with psychotic features: Secondary | ICD-10-CM

## 2014-03-05 DIAGNOSIS — F459 Somatoform disorder, unspecified: Secondary | ICD-10-CM

## 2014-03-05 DIAGNOSIS — F32A Depression, unspecified: Secondary | ICD-10-CM

## 2014-03-05 DIAGNOSIS — F329 Major depressive disorder, single episode, unspecified: Secondary | ICD-10-CM

## 2014-03-05 MED ORDER — MIRTAZAPINE 7.5 MG PO TABS: 7.5000 mg | ORAL_TABLET | Freq: Every day | ORAL | Status: DC

## 2014-03-05 MED ORDER — ALPRAZOLAM 0.25 MG PO TABS: 0.2500 mg | ORAL_TABLET | Freq: Three times a day (TID) | ORAL | Status: DC

## 2014-03-05 MED ORDER — GABAPENTIN 100 MG PO CAPS
100.0000 mg | ORAL_CAPSULE | Freq: Three times a day (TID) | ORAL | Status: DC
Start: 2014-03-05 — End: 2014-06-04

## 2014-03-05 MED ORDER — ESCITALOPRAM OXALATE 10 MG PO TABS: 10.0000 mg | ORAL_TABLET | Freq: Every day | ORAL | Status: DC

## 2014-03-05 NOTE — Progress Notes (Signed)
Patient ID: CORLIS ANGELICA, female   DOB: 12-03-1940, 73 y.o.   MRN: [id number] Patient ID: MCCALL WILL, female   DOB: 1940-09-26, 73 y.o.   MRN: [id number] Patient ID: AMITA ATAYDE, female   DOB: 26-Apr-1941, 73 y.o.   MRN: [id number] Patient ID: JONIE BURDELL, female   DOB: 1941/01/03, 73 y.o.   MRN: [id number] Patient ID: DELIA SLATTEN, female   DOB: 1940-09-22, 73 y.o.   MRN: [id number] Patient ID: MASHAL SLAVICK, female   DOB: [birth year], 73 y.o.   MRN: [id number]  Psychiatric Assessment Adult  Patient Identification:  Audrey Johnson Date of Evaluation:  03/05/2014 Chief Complaint: "I've been tired lately History of Chief Complaint:   Chief Complaint  Patient presents with  . Anxiety  . Depression  . Follow-up    Anxiety Symptoms include confusion, nausea, nervous/anxious behavior and suicidal ideas.    Altered Mental Status Associated symptoms include abdominal pain and nausea.   this patient is a 73 year old widowed black female who lives with her son in [place]. She's accompanied by her sister [name] and her son-in-law [name]. I also spoke on the phone to her daughter [name]. The patient used to work in a [employer] but has been retired for several years.  The patient was referred by Dr. Nevada Crane, her primary Dr. Apparently she's not had any history of mental health symptoms until about 2 months ago. She started to have severe stomach pain and has gone to several emergency room this in the local area. She's had 9 emergency room visits in the last couple of months. Her last one was yesterday. She's had 3 abdominal CTs in the last month, and ultrasound and blood work. Nothing significantly wrong has been found.  The patient is on numerous medications for her stomach but they don't seem to help. She was prescribed Lexapro by her primary doctor but she refused to take it.  she is taking a low dose of Xanax  She returns after 3 months . For the most  part she is doing well but her right knee continues to hurt. She needs to go back to her primary doctor and get a referral to orthopedics. She is limping pretty badly today. Her legs are hurting as well. She claims her medications make her drowsy and I suggested she cut down Xanax twice a day. Her mood has been good and she's been spending a lot of time gardening. She's cut down or eating a little bit recently and claims she's not particularly hungry but I suggested she not cut it down too drastically Review of Systems  Constitutional: Positive for appetite change and unexpected weight change.  Gastrointestinal: Positive for nausea and abdominal pain.  Psychiatric/Behavioral: Positive for suicidal ideas, hallucinations, behavioral problems, confusion and agitation. The patient is nervous/anxious.    Physical Exam not done  Depressive Symptoms: depressed mood, anhedonia, insomnia, psychomotor agitation, difficulty concentrating, hopelessness, suicidal thoughts with specific plan, anxiety, loss of energy/fatigue, weight loss,  (Hypo) Manic Symptoms:   Elevated Mood:  No Irritable Mood:  Yes Grandiosity:  No Distractibility:  No Labiality of Mood:  Yes Delusions:  Yes Hallucinations:  Yes Impulsivity:  No Sexually Inappropriate Behavior:  No Financial Extravagance:  No Flight of Ideas:  No  Anxiety Symptoms: Excessive Worry:  Yes Panic Symptoms:  No Agoraphobia:  No Obsessive Compulsive: No  Symptoms: None, Specific Phobias:  No Social Anxiety:  No  Psychotic Symptoms:  Hallucinations: Yes Visual Delusions:  Yes Paranoia:  No   Ideas of Reference:  No  PTSD Symptoms: Ever had a traumatic exposure:  No Had a traumatic exposure in the last month:  No Re-experiencing: No None Hypervigilance:  No Hyperarousal: No None Avoidance: No None  Traumatic Brain Injury: No   Past Psychiatric History: Diagnosis: None   Hospitalizations: None   Outpatient Care: None    Substance Abuse Care: None   Self-Mutilation: None   Suicidal Attempts: None   Violent Behaviors: None    Past Medical History:   Past Medical History  Diagnosis Date  . Hypertension   . High cholesterol   . Chronic back pain   . Anxiety   . Shortness of breath      even walking causes sob...   . Arthritis     in back .   Marland Kitchen Pneumonia   . Borderline diabetes   . Chronic abdominal pain   . Chronic constipation   . Chronic nausea   . Noncompliance with medication regimen   . GERD (gastroesophageal reflux disease)    History of Loss of Consciousness:  No Seizure History:  No Cardiac History:  No Allergies:  No Known Allergies Current Medications:  Current Outpatient Prescriptions  Medication Sig Dispense Refill  . ALPRAZolam (XANAX) 0.25 MG tablet Take 1 tablet (0.25 mg total) by mouth 3 (three) times daily.  90 tablet  2  . escitalopram (LEXAPRO) 10 MG tablet Take 1 tablet (10 mg total) by mouth daily.  30 tablet  2  . gabapentin (NEURONTIN) 100 MG capsule Take 1 capsule (100 mg total) by mouth 3 (three) times daily.  90 capsule  2  . Linaclotide (LINZESS) 290 MCG CAPS capsule Take 1 capsule (290 mcg total) by mouth daily.  30 capsule  11  . mirtazapine (REMERON) 7.5 MG tablet Take 1 tablet (7.5 mg total) by mouth at bedtime.  30 tablet  2  . naproxen (NAPROSYN) 500 MG tablet Take 1 tablet (500 mg total) by mouth 2 (two) times daily.  20 tablet  0  . nebivolol (BYSTOLIC) 5 MG tablet Take 5 mg by mouth daily.      Marland Kitchen omeprazole (PRILOSEC) 20 MG capsule Take 2 capsules (40 mg total) by mouth 2 (two) times daily before a meal. For acid reflux  60 capsule  5   No current facility-administered medications for this visit.    Previous Psychotropic Medications:  Medication Dose   Xanax   0.25 mg every 8 hours as needed                      Substance Abuse History in the last 12 months: Substance Age of 1st Use Last Use Amount Specific Type  Nicotine      Alcohol       Cannabis      Opiates      Cocaine      Methamphetamines      LSD      Ecstasy      Benzodiazepines      Caffeine      Inhalants      Others:                          Medical Consequences of Substance Abuse: n/a  Legal Consequences of Substance Abuse: n/a  Family Consequences of Substance Abuse: n/a  Blackouts:  No DT's:  No Withdrawal Symptoms:  No None  Social History: Current Place of Residence: [place]  [employer] of Birth: Unknown Family Members: Lives with son Marital Status:  Widowed Children:   Sons: 1  Daughters: 1 Relationships:  Education:  [employer] Problems/Performance:  Religious Beliefs/Practices: Unknown History of Abuse: none Pensions consultant; Manufacturing engineer History:  None. Legal History: None Hobbies/Interests: Church  Family History:   Family History  Problem Relation Age of Onset  . Colon cancer Neg Hx     Mental Status Examination/Evaluation: Objective:  Appearance:   She is neatly dressed, pleasant but limping   Eye Contact::  Good   Speech:  Normal   Volume:  Decreased  Mood: Good  Affect:  Euthymic   Thought Process:  Organized   Orientation:  Full (Time, Place, and Person)  Thought Content:  No delusions or hallucinations   Suicidal Thoughts:  No  Homicidal Thoughts:  No  Judgement:  Impaired  Insight:  Lacking  Psychomotor Activity:  Normal   Akathisia:  No  Handed:  Right  AIMS (if indicated):    Assets:  Social Support    Laboratory/X-Ray Psychological Evaluation(s)        Assessment:  Maj. depression, single episode with psychotic features and somatization disorder  AXIS I Major Depression, single episode with psychotic features, and somatization disorder   AXIS II Deferred  AXIS III Past Medical History  Diagnosis Date  . Hypertension   . High cholesterol   . Chronic back pain   . Anxiety   . Shortness of breath      even walking causes sob...   . Arthritis      in back .   Marland Kitchen Pneumonia   . Borderline diabetes   . Chronic abdominal pain   . Chronic constipation   . Chronic nausea   . Noncompliance with medication regimen   . GERD (gastroesophageal reflux disease)      AXIS IV other psychosocial or environmental problems  AXIS V 11-20 some danger of hurting self or others possible OR occasionally fails to maintain minimal personal hygiene OR gross impairment in communication   Treatment Plan/Recommendations:  Plan of Care: Medication management   Laboratory:    Psychotherapy: She is scheduled to see Maurice Small   Medications: The patient will continue mirtazapine 7.5 mg each bedtime as this helped her sleep. She'll continue Lexapro 10 mg every morning and Xanax 0.25 mg 2-3 times a day and continue Neurontin 100 mg 3 times a day. All other medications will need to be managed by primary care she is no longer on any narcotics   Routine PRN Medications:  No  Consultations:   Safety Concerns:    Other:  She'll return in 3 months    [name], MD 9/4/20153:01 PM

## 2014-03-06 ENCOUNTER — Other Ambulatory Visit: Payer: Self-pay | Admitting: Gastroenterology

## 2014-03-09 DIAGNOSIS — M25569 Pain in unspecified knee: Secondary | ICD-10-CM | POA: Diagnosis not present

## 2014-03-09 DIAGNOSIS — R112 Nausea with vomiting, unspecified: Secondary | ICD-10-CM | POA: Diagnosis not present

## 2014-03-09 DIAGNOSIS — R319 Hematuria, unspecified: Secondary | ICD-10-CM | POA: Diagnosis not present

## 2014-03-09 DIAGNOSIS — R109 Unspecified abdominal pain: Secondary | ICD-10-CM | POA: Diagnosis not present

## 2014-03-09 DIAGNOSIS — M2559 Pain in other specified joint: Secondary | ICD-10-CM | POA: Diagnosis not present

## 2014-03-09 DIAGNOSIS — N390 Urinary tract infection, site not specified: Secondary | ICD-10-CM | POA: Diagnosis not present

## 2014-03-09 DIAGNOSIS — R110 Nausea: Secondary | ICD-10-CM | POA: Diagnosis not present

## 2014-03-31 ENCOUNTER — Encounter: Payer: Self-pay | Admitting: Orthopedic Surgery

## 2014-03-31 ENCOUNTER — Ambulatory Visit (INDEPENDENT_AMBULATORY_CARE_PROVIDER_SITE_OTHER): Payer: Medicare Other | Admitting: Orthopedic Surgery

## 2014-03-31 VITALS — BP 151/70 | Ht 67.0 in | Wt 179.8 lb

## 2014-03-31 DIAGNOSIS — M5137 Other intervertebral disc degeneration, lumbosacral region: Secondary | ICD-10-CM

## 2014-03-31 DIAGNOSIS — M1710 Unilateral primary osteoarthritis, unspecified knee: Secondary | ICD-10-CM

## 2014-03-31 DIAGNOSIS — M1711 Unilateral primary osteoarthritis, right knee: Secondary | ICD-10-CM

## 2014-03-31 MED ORDER — ACETAMINOPHEN-CODEINE #3 300-30 MG PO TABS: 1.0000 | ORAL_TABLET | Freq: Four times a day (QID) | ORAL | Status: DC | PRN

## 2014-03-31 NOTE — Progress Notes (Signed)
New   73 year old female with a history of severe disc disease status post epidural steroid injections last year presents with radicular leg pain and pain giving way tingling stiffness throbbing and aching sensation in the right knee and complains of lower back pain with pain radiating into the right foot down the lateral side the leg. Pain is present morning and night it 8/10 it is unrelieved by any local measures.  Family physician Dr. Nevada Crane  Pharmacy [facility]  Past Medical History  Diagnosis Date  . Hypertension   . High cholesterol   . Chronic back pain   . Anxiety   . Shortness of breath      even walking causes sob...   . Arthritis     in back .   Marland Kitchen Pneumonia   . Borderline diabetes   . Chronic abdominal pain   . Chronic constipation   . Chronic nausea   . Noncompliance with medication regimen   . GERD (gastroesophageal reflux disease)     History recorded in Epic and reviewed.  BP 151/70  Ht 5\' 7"  (1.702 m)  Wt 179 lb 12.8 oz (81.557 kg)  BMI 28.15 kg/m2  Overall her appearance is normal she is oriented x3 she appears to be in distress she is ambulating with a cane her mood is depressed her affect is flat  Fortunately her upper extremities show no malalignment problems. Full range of motion. Normal stability. Normal strength normal skin  Lower extremities left knee looks in anatomic alignment the right knee looks in valgus. Knee flexion in both knees 125. Both knees are stable muscle tone strength normal in both knees skin normal both knees and legs.  Pulse and temperature are normal without edema lymph nodes negative sensation normal reflexes are intact and her balance is adequate  Her x-rays show arthritis of the knee moderate  She has severe disc disease   IMPRESSION: Severe multilevel lumbar spondylosis detailed above. No interval change compared to the prior exam of Apr 27, 2012. Stenosis is most pronounced at L4-L5 with unchanged disc  extrusion.  FINDINGS: Frontal, lateral, and bilateral oblique views were obtained. There is no fracture or dislocation. There is a small joint effusion. There is moderate narrowing laterally, progressed from prior study. There is slight narrowing of the patellofemoral joint. There is mild spurring in all compartments.  I did inject her right knee but there is nothing I can do until she gets her back fixed to protocol back to her neurosurgeon for further treatment of that

## 2014-04-01 ENCOUNTER — Ambulatory Visit: Payer: Self-pay | Admitting: Orthopedic Surgery

## 2014-04-07 ENCOUNTER — Ambulatory Visit (INDEPENDENT_AMBULATORY_CARE_PROVIDER_SITE_OTHER): Payer: Medicare Other | Admitting: Gastroenterology

## 2014-04-07 ENCOUNTER — Ambulatory Visit (HOSPITAL_COMMUNITY)
Admission: RE | Admit: 2014-04-07 | Discharge: 2014-04-07 | Disposition: A | Discharge status: Home / Self Care | Payer: Medicare Other | Source: Ambulatory Visit | Attending: Internal Medicine | Admitting: Internal Medicine

## 2014-04-07 ENCOUNTER — Encounter: Payer: Self-pay | Admitting: Gastroenterology

## 2014-04-07 VITALS — BP 140/76 | HR 78 | Temp 99.3°F | Ht 64.0 in | Wt 185.2 lb

## 2014-04-07 DIAGNOSIS — R7989 Other specified abnormal findings of blood chemistry: Secondary | ICD-10-CM | POA: Diagnosis not present

## 2014-04-07 DIAGNOSIS — R109 Unspecified abdominal pain: Secondary | ICD-10-CM

## 2014-04-07 DIAGNOSIS — G8929 Other chronic pain: Secondary | ICD-10-CM | POA: Diagnosis not present

## 2014-04-07 DIAGNOSIS — Z1231 Encounter for screening mammogram for malignant neoplasm of breast: Secondary | ICD-10-CM | POA: Insufficient documentation

## 2014-04-07 DIAGNOSIS — K219 Gastro-esophageal reflux disease without esophagitis: Secondary | ICD-10-CM | POA: Diagnosis not present

## 2014-04-07 DIAGNOSIS — R945 Abnormal results of liver function studies: Secondary | ICD-10-CM

## 2014-04-07 MED ORDER — OMEPRAZOLE 20 MG PO CPDR: 20.0000 mg | DELAYED_RELEASE_CAPSULE | Freq: Two times a day (BID) | ORAL | Status: DC

## 2014-04-07 NOTE — Patient Instructions (Signed)
1. Take Linzess only when you need to for constipation. You do not have to take every day. 2. I will be adjusting your omeprazole to 20mg  twice per day with your next refill. 3. Return to office in one year or sooner if needed.

## 2014-04-07 NOTE — Assessment & Plan Note (Signed)
Chronic abdominal pain/mostly in the left flank. States she's feeling much better at this time. History of extensive back issues, sounds like recently received oral medication for this, questionable steroids.

## 2014-04-07 NOTE — Assessment & Plan Note (Signed)
Requested most recent LFTs from PCP for review.

## 2014-04-07 NOTE — Progress Notes (Signed)
Primary Care Physician: Delphina Cahill, MD  Primary Gastroenterologist:  Garfield Cornea, MD   Chief Complaint  Patient presents with  . Abdominal Pain    HPI: Audrey Johnson is a 73 y.o. female here for followup. Last seen in January 2015. History of chronic abdominal pain with extensive evaluation including CT negative for mesenteric ischemia in June 2014 (Dr. Thornton Papas did not recommend CTA), EGD and colonoscopy (hyperplastic polyp, negative for H. pylori, erosive reflux esophagitis), CT (multiple), MRI of the spine (severe multilevel lumbar spondylosis, stenosis at L4-L5 unchanged from 2013). Last CT abdomen pelvis was in October 2014. She had minimal right-sided hydronephrosis of uncertain significance, similar to prior study. She was treated for H. pylori positive serologies by her PCP in 2014 with Prevpac.   "Feel 100% better".  Not taking Linzess, too strong for her. Going regularly for past several weeks. No N/V. Abdominal pain gone for now. Heartburn well-controlled. She believes she still on omeprazole 40 mg twice a day. Denies any dysphagia. Appetite is good. She has gained over 30 pounds in the past year more.  Current Outpatient Prescriptions  Medication Sig Dispense Refill  . acetaminophen-codeine (TYLENOL #3) 300-30 MG per tablet Take 1 tablet by mouth every 6 (six) hours as needed for moderate pain.  60 tablet  0  . ALPRAZolam (XANAX) 0.25 MG tablet Take 1 tablet (0.25 mg total) by mouth 3 (three) times daily.  90 tablet  2  . escitalopram (LEXAPRO) 10 MG tablet Take 1 tablet (10 mg total) by mouth daily.  30 tablet  2  . gabapentin (NEURONTIN) 100 MG capsule Take 1 capsule (100 mg total) by mouth 3 (three) times daily.  90 capsule  2  . LINZESS 290 MCG CAPS capsule TAKE 1 CAPSULE BY MOUTH DAILY. 30 MINUTES BEFORE BREAKFAST  30 capsule  8  . mirtazapine (REMERON) 7.5 MG tablet Take 1 tablet (7.5 mg total) by mouth at bedtime.  30 tablet  2  . naproxen (NAPROSYN) 500 MG  tablet Take 1 tablet (500 mg total) by mouth 2 (two) times daily.  20 tablet  0  . nebivolol (BYSTOLIC) 5 MG tablet Take 5 mg by mouth daily.      Marland Kitchen omeprazole (PRILOSEC) 20 MG capsule Take 2 capsules (40 mg total) by mouth 2 (two) times daily before a meal. For acid reflux  60 capsule  5   No current facility-administered medications for this visit.    Allergies as of 04/07/2014  . (No Known Allergies)   Past Medical History  Diagnosis Date  . Hypertension   . High cholesterol   . Chronic back pain   . Anxiety   . Shortness of breath      even walking causes sob...   . Arthritis     in back .   Marland Kitchen Pneumonia   . Borderline diabetes   . Chronic abdominal pain   . Chronic constipation   . Chronic nausea   . Noncompliance with medication regimen   . GERD (gastroesophageal reflux disease)    Past Surgical History  Procedure Laterality Date  . Abdominal hysterectomy    . Toe surgery    . Dilation and curettage of uterus    . Breast surgery  unknown (many yrs)    excision  of lump right breast  was per pt "nothing"   . Cardiac catheterization    . Colonoscopy  04/16/2007    RMR: Normal rectum and colon  .  Colonoscopy with esophagogastroduodenoscopy (egd) N/A 01/08/2013    EBR:AXEN erosive reflux esophagitis. Negative H.pylori. Small hiatal hernia-s/p bx (gastritis)-TCS/Colonic polyp-hyperplastic    ROS:  General: Negative for anorexia, weight loss, fever, chills, fatigue, weakness. ENT: Negative for hoarseness, difficulty swallowing , nasal congestion. CV: Negative for chest pain, angina, palpitations, dyspnea on exertion, peripheral edema.  Respiratory: Negative for dyspnea at rest, dyspnea on exertion, cough, sputum, wheezing.  GI: See history of present illness. GU:  Negative for dysuria, hematuria, urinary incontinence, urinary frequency, nocturnal urination.  Endo: Negative for unusual weight change.    Physical Examination:   BP 140/76  Pulse 78  Temp(Src) 99.3  F (37.4 C) (Oral)  Ht 5\' 4"  (1.626 m)  Wt 185 lb 3.2 oz (84.006 kg)  BMI 31.77 kg/m2  General: Well-nourished, well-developed in no acute distress.  Eyes: No icterus. Mouth: Oropharyngeal mucosa moist and pink , no lesions erythema or exudate. Lungs: Clear to auscultation bilaterally.  Heart: Regular rate and rhythm, no murmurs rubs or gallops.  Abdomen: Bowel sounds are normal, nontender, nondistended, no hepatosplenomegaly or masses, no abdominal bruits or hernia , no rebound or guarding.   Extremities: No lower extremity edema. No clubbing or deformities. Neuro: Alert and oriented x 4   Skin: Warm and dry, no jaundice.   Psych: Alert and cooperative, normal mood and affect.  Labs:  Lab Results  Component Value Date   WBC 5.9 04/24/2013   HGB 13.0 04/24/2013   HCT 37.7 04/24/2013   MCV 88.7 04/24/2013   PLT 516* 04/24/2013   Lab Results  Component Value Date   CREATININE 0.87 04/24/2013   BUN 12 04/24/2013   NA 139 04/24/2013   K 3.1* 04/24/2013   CL 102 04/24/2013   CO2 22 04/24/2013   Lab Results  Component Value Date   ALT 103* 04/21/2013   AST 56* 04/21/2013   ALKPHOS 84 04/21/2013   BILITOT 0.7 04/21/2013    Imaging Studies: No results found.

## 2014-04-07 NOTE — Assessment & Plan Note (Signed)
Well-controlled. Will reduce dose to 20 mg twice daily. She will call if recurrent GERD symptoms. Otherwise see her back in one year.

## 2014-04-08 NOTE — Progress Notes (Signed)
cc'ed to pcp °

## 2014-04-29 ENCOUNTER — Telehealth: Payer: Self-pay | Admitting: Internal Medicine

## 2014-04-29 NOTE — Telephone Encounter (Signed)
Let's call the patient and clarify.   I reduced her omeprazole to 20mg  BID at her last OV 3 weeks ago.  Is she talking about the Linzess?

## 2014-04-29 NOTE — Telephone Encounter (Signed)
Pt seen LSL not long ago and patient can't remember the name of her medicine for her stomach, but she was asking if we could lower the dosage. The medicine is upsetting her stomach and would like the dose lower and a new rx [facility] in [place]. [phone]

## 2014-04-29 NOTE — Telephone Encounter (Signed)
Routing to LSL- pt is on omeprazole.

## 2014-04-30 MED ORDER — LINACLOTIDE 145 MCG PO CAPS: 145.0000 ug | ORAL_CAPSULE | Freq: Every day | ORAL | Status: DC

## 2014-04-30 NOTE — Telephone Encounter (Signed)
She is referring to the linzess.

## 2014-04-30 NOTE — Telephone Encounter (Signed)
linzess 179mcg RX sent to pharmacy. Stop the 22mcg.  Please let pt know.

## 2014-05-06 NOTE — Telephone Encounter (Signed)
Pt is aware.  

## 2014-05-12 NOTE — Progress Notes (Signed)
Reviewed labs from PCP dated 03/09/2014: BUN 11, creatinine 1.14, total bilirubin 0.7, alkaline phosphatase 95, AST 18, ALT 14, albumin 4.2, white blood cell count 5400, hemoglobin 13.1, hematocrit 39.8, platelets 502,000.  LFTs normal. No further w/u needed.

## 2014-06-04 ENCOUNTER — Encounter (HOSPITAL_COMMUNITY): Payer: Self-pay | Admitting: Psychiatry

## 2014-06-04 ENCOUNTER — Ambulatory Visit (INDEPENDENT_AMBULATORY_CARE_PROVIDER_SITE_OTHER): Payer: Medicare Other | Admitting: Psychiatry

## 2014-06-04 VITALS — BP 135/77 | HR 73 | Ht 64.0 in | Wt 190.4 lb

## 2014-06-04 DIAGNOSIS — F459 Somatoform disorder, unspecified: Secondary | ICD-10-CM

## 2014-06-04 DIAGNOSIS — F323 Major depressive disorder, single episode, severe with psychotic features: Secondary | ICD-10-CM | POA: Diagnosis not present

## 2014-06-04 DIAGNOSIS — F329 Major depressive disorder, single episode, unspecified: Secondary | ICD-10-CM

## 2014-06-04 DIAGNOSIS — F32A Depression, unspecified: Secondary | ICD-10-CM

## 2014-06-04 MED ORDER — MIRTAZAPINE 7.5 MG PO TABS: 7.5000 mg | ORAL_TABLET | Freq: Every day | ORAL | Status: DC

## 2014-06-04 MED ORDER — ALPRAZOLAM 0.25 MG PO TABS: 0.2500 mg | ORAL_TABLET | Freq: Three times a day (TID) | ORAL | Status: DC | PRN

## 2014-06-04 MED ORDER — GABAPENTIN 100 MG PO CAPS: 100.0000 mg | ORAL_CAPSULE | Freq: Three times a day (TID) | ORAL | Status: DC

## 2014-06-04 MED ORDER — ESCITALOPRAM OXALATE 10 MG PO TABS: 10.0000 mg | ORAL_TABLET | Freq: Every day | ORAL | Status: DC

## 2014-06-04 NOTE — Progress Notes (Signed)
Patient ID: LARRISHA BABINEAU, female   DOB: 06-02-1941, 73 y.o.   MRN: [id number] Patient ID: AASIYA CREASEY, female   DOB: 1940-09-29, 73 y.o.   MRN: [id number] Patient ID: LAINIE DAUBERT, female   DOB: March 22, 1941, 73 y.o.   MRN: [id number] Patient ID: SANDRALEE TARKINGTON, female   DOB: 1941/04/26, 73 y.o.   MRN: [id number] Patient ID: COY VANDOREN, female   DOB: 02-Mar-1941, 73 y.o.   MRN: [id number] Patient ID: MIKELA SENN, female   DOB: Aug 21, 1940, 73 y.o.   MRN: [id number] Patient ID: EARNESTENE ANGELLO, female   DOB: 1940-09-05, 73 y.o.   MRN: [id number]  Psychiatric Assessment Adult  Patient Identification:  Audrey Johnson Date of Evaluation:  06/04/2014 Chief Complaint:   "I've been tired lately History of Chief Complaint:   Chief Complaint  Patient presents with  . Depression  . Anxiety  . Follow-up    Anxiety Symptoms include confusion, nausea, nervous/anxious behavior and suicidal ideas.    Altered Mental Status Associated symptoms include abdominal pain and nausea.   this patient is a 73 year old widowed black female who lives with her son in [place]. She's accompanied by her sister [name] and her son-in-law [name]. I also spoke on the phone to her daughter [name]. The patient used to work in a [employer] but has been retired for several years.  The patient was referred by Dr. Nevada Crane, her primary Dr. Apparently she's not had any history of mental health symptoms until about 2 months ago. She started to have severe stomach pain and has gone to several emergency room this in the local area. She's had 9 emergency room visits in the last couple of months. Her last one was yesterday. She's had 3 abdominal CTs in the last month, and ultrasound and blood work. Nothing significantly wrong has been found.  The patient is on numerous medications for her stomach but they don't seem to help. She was prescribed Lexapro by her primary doctor but she refused to take  it.  she is taking a low dose of Xanax  She returns after 3 months . For the most part she is doing well. She is still limping and her knee hurts. Her orthopedic doctor said there was nothing more he could do on the she had back surgery. She is electing not to do this at the moment and her neurosurgeon does not think it is urgent. She did get steroid injections in her back which helped. Her mood is been stable and she has not had any recurrence of depression anxiety symptoms. She is eating and sleeping well. She still feels drowsy through the day and I told her to try to cut back on the Xanax to 2 pills a day for a while and then try to go to one a day and use it only as needed Review of Systems  Constitutional: Positive for appetite change and unexpected weight change.  Gastrointestinal: Positive for nausea and abdominal pain.  Psychiatric/Behavioral: Positive for suicidal ideas, hallucinations, behavioral problems, confusion and agitation. The patient is nervous/anxious.    Physical Exam not done  Depressive Symptoms: depressed mood, anhedonia, insomnia, psychomotor agitation, difficulty concentrating, hopelessness, suicidal thoughts with specific plan, anxiety, loss of energy/fatigue, weight loss,  (Hypo) Manic Symptoms:   Elevated Mood:  No Irritable Mood:  Yes Grandiosity:  No Distractibility:  No Labiality of Mood:  Yes Delusions:  Yes Hallucinations:  Yes Impulsivity:  No Sexually Inappropriate Behavior:  No Financial Extravagance:  No Flight  of Ideas:  No  Anxiety Symptoms: Excessive Worry:  Yes Panic Symptoms:  No Agoraphobia:  No Obsessive Compulsive: No  Symptoms: None, Specific Phobias:  No Social Anxiety:  No  Psychotic Symptoms:  Hallucinations: Yes Visual Delusions:  Yes Paranoia:  No   Ideas of Reference:  No  PTSD Symptoms: Ever had a traumatic exposure:  No Had a traumatic exposure in the last month:  No Re-experiencing: No None Hypervigilance:   No Hyperarousal: No None Avoidance: No None  Traumatic Brain Injury: No   Past Psychiatric History: Diagnosis: None   Hospitalizations: None   Outpatient Care: None   Substance Abuse Care: None   Self-Mutilation: None   Suicidal Attempts: None   Violent Behaviors: None    Past Medical History:   Past Medical History  Diagnosis Date  . Hypertension   . High cholesterol   . Chronic back pain   . Anxiety   . Shortness of breath      even walking causes sob...   . Arthritis     in back .   Marland Kitchen Pneumonia   . Borderline diabetes   . Chronic abdominal pain   . Chronic constipation   . Chronic nausea   . Noncompliance with medication regimen   . GERD (gastroesophageal reflux disease)    History of Loss of Consciousness:  No Seizure History:  No Cardiac History:  No Allergies:  No Known Allergies Current Medications:  Current Outpatient Prescriptions  Medication Sig Dispense Refill  . ALPRAZolam (XANAX) 0.25 MG tablet Take 1 tablet (0.25 mg total) by mouth 3 (three) times daily as needed for anxiety. 90 tablet 2  . escitalopram (LEXAPRO) 10 MG tablet Take 1 tablet (10 mg total) by mouth daily. 30 tablet 2  . gabapentin (NEURONTIN) 100 MG capsule Take 1 capsule (100 mg total) by mouth 3 (three) times daily. 90 capsule 2  . Linaclotide (LINZESS) 145 MCG CAPS capsule Take 1 capsule (145 mcg total) by mouth daily. 30 capsule 11  . mirtazapine (REMERON) 7.5 MG tablet Take 1 tablet (7.5 mg total) by mouth at bedtime. 30 tablet 2  . naproxen (NAPROSYN) 500 MG tablet Take 1 tablet (500 mg total) by mouth 2 (two) times daily. 20 tablet 0  . nebivolol (BYSTOLIC) 5 MG tablet Take 5 mg by mouth daily.    Marland Kitchen omeprazole (PRILOSEC) 20 MG capsule Take 1 capsule (20 mg total) by mouth 2 (two) times daily before a meal. For acid reflux 60 capsule 5   No current facility-administered medications for this visit.    Previous Psychotropic Medications:  Medication Dose   Xanax   0.25 mg every 8  hours as needed                      Substance Abuse History in the last 12 months: Substance Age of 1st Use Last Use Amount Specific Type  Nicotine      Alcohol      Cannabis      Opiates      Cocaine      Methamphetamines      LSD      Ecstasy      Benzodiazepines      Caffeine      Inhalants      Others:                          Medical Consequences of Substance Abuse: n/a  Legal  Consequences of Substance Abuse: n/a  Family Consequences of Substance Abuse: n/a  Blackouts:  No DT's:  No Withdrawal Symptoms:  No None  Social History: Current Place of Residence: [place] of Birth: Unknown Family Members: Lives with son Marital Status:  Widowed Children:   Sons: 1  Daughters: 1 Relationships:  Education:  [employer] Problems/Performance:  Religious Beliefs/Practices: Unknown History of Abuse: none Pensions consultant; Manufacturing engineer History:  None. Legal History: None Hobbies/Interests: Church  Family History:   Family History  Problem Relation Age of Onset  . Colon cancer Neg Hx     Mental Status Examination/Evaluation: Objective:  Appearance:   She is neatly dressed, pleasant but limping , walking with a cane   Eye Contact::  Good   Speech:  Normal   Volume:  Decreased  Mood: Good  Affect:  Euthymic   Thought Process:  Organized   Orientation:  Full (Time, Place, and Person)  Thought Content:  No delusions or hallucinations   Suicidal Thoughts:  No  Homicidal Thoughts:  No  Judgement:  Impaired  Insight:  Lacking  Psychomotor Activity:  Normal   Akathisia:  No  Handed:  Right  AIMS (if indicated):    Assets:  Social Support    Laboratory/X-Ray Psychological Evaluation(s)        Assessment:  Maj. depression, single episode with psychotic features and somatization disorder  AXIS I Major Depression, single episode with psychotic features, and somatization disorder   AXIS II Deferred   AXIS III Past Medical History  Diagnosis Date  . Hypertension   . High cholesterol   . Chronic back pain   . Anxiety   . Shortness of breath      even walking causes sob...   . Arthritis     in back .   Marland Kitchen Pneumonia   . Borderline diabetes   . Chronic abdominal pain   . Chronic constipation   . Chronic nausea   . Noncompliance with medication regimen   . GERD (gastroesophageal reflux disease)      AXIS IV other psychosocial or environmental problems  AXIS V 11-20 some danger of hurting self or others possible OR occasionally fails to maintain minimal personal hygiene OR gross impairment in communication   Treatment Plan/Recommendations:  Plan of Care: Medication management   Laboratory:    Psychotherapy: She is scheduled to see Maurice Small   Medications: The patient will continue mirtazapine 7.5 mg each bedtime as this helped her sleep. She'll continue Lexapro 10 mg every morning  and continue Neurontin 100 mg 3 times a day. She will decrease Xanax 0.25 mg to 2 times a day for several days and try to go to one a day and only use it as needed   Routine PRN Medications:  No  Consultations:   Safety Concerns:    Other:  She'll return in 3 months    ROSS, Neoma Laming, MD 12/4/20152:49 PM

## 2014-06-05 ENCOUNTER — Other Ambulatory Visit: Payer: Self-pay | Admitting: Gastroenterology

## 2014-06-08 ENCOUNTER — Other Ambulatory Visit (HOSPITAL_COMMUNITY): Payer: Self-pay | Admitting: Psychiatry

## 2014-06-09 DIAGNOSIS — E119 Type 2 diabetes mellitus without complications: Secondary | ICD-10-CM | POA: Diagnosis not present

## 2014-06-09 DIAGNOSIS — I10 Essential (primary) hypertension: Secondary | ICD-10-CM | POA: Diagnosis not present

## 2014-06-10 ENCOUNTER — Encounter (HOSPITAL_COMMUNITY): Payer: Self-pay | Admitting: Cardiology

## 2014-06-11 DIAGNOSIS — E782 Mixed hyperlipidemia: Secondary | ICD-10-CM | POA: Diagnosis not present

## 2014-06-11 DIAGNOSIS — I10 Essential (primary) hypertension: Secondary | ICD-10-CM | POA: Diagnosis not present

## 2014-06-11 DIAGNOSIS — E1165 Type 2 diabetes mellitus with hyperglycemia: Secondary | ICD-10-CM | POA: Diagnosis not present

## 2014-07-15 ENCOUNTER — Encounter (HOSPITAL_COMMUNITY): Payer: Self-pay | Admitting: Neurosurgery

## 2014-09-01 ENCOUNTER — Other Ambulatory Visit: Payer: Self-pay

## 2014-09-01 MED ORDER — OMEPRAZOLE 20 MG PO CPDR: DELAYED_RELEASE_CAPSULE | ORAL | Status: DC

## 2014-09-02 ENCOUNTER — Telehealth (HOSPITAL_COMMUNITY): Payer: Self-pay | Admitting: *Deleted

## 2014-09-02 NOTE — Telephone Encounter (Signed)
Request for 90 day supply of Escitalopram 10mg . Pt next appointment 09/03/14 and MD will fill at appointment. No further action required at this time.

## 2014-09-03 ENCOUNTER — Encounter (HOSPITAL_COMMUNITY): Payer: Self-pay | Admitting: Psychiatry

## 2014-09-03 ENCOUNTER — Ambulatory Visit (INDEPENDENT_AMBULATORY_CARE_PROVIDER_SITE_OTHER): Payer: Medicare Other | Admitting: Psychiatry

## 2014-09-03 VITALS — BP 147/74 | HR 73 | Ht 64.0 in | Wt 194.0 lb

## 2014-09-03 DIAGNOSIS — F32A Depression, unspecified: Secondary | ICD-10-CM

## 2014-09-03 DIAGNOSIS — F459 Somatoform disorder, unspecified: Secondary | ICD-10-CM | POA: Diagnosis not present

## 2014-09-03 DIAGNOSIS — F329 Major depressive disorder, single episode, unspecified: Secondary | ICD-10-CM | POA: Diagnosis not present

## 2014-09-03 MED ORDER — GABAPENTIN 100 MG PO CAPS: 100.0000 mg | ORAL_CAPSULE | Freq: Three times a day (TID) | ORAL | Status: DC

## 2014-09-03 MED ORDER — ESCITALOPRAM OXALATE 10 MG PO TABS: 10.0000 mg | ORAL_TABLET | Freq: Every day | ORAL | Status: DC

## 2014-09-03 MED ORDER — MIRTAZAPINE 7.5 MG PO TABS: 7.5000 mg | ORAL_TABLET | Freq: Every day | ORAL | Status: DC

## 2014-09-03 MED ORDER — ALPRAZOLAM 0.25 MG PO TABS: 0.2500 mg | ORAL_TABLET | Freq: Two times a day (BID) | ORAL | Status: DC

## 2014-09-03 NOTE — Patient Instructions (Signed)
Decrease alprazolam to twice a day

## 2014-09-03 NOTE — Progress Notes (Signed)
Patient ID: Audrey Johnson, female   DOB: 08-Feb-1941, 74 y.o.   MRN: [id number] Patient ID: Audrey Johnson, female   DOB: 01/04/1941, 74 y.o.   MRN: [id number] Patient ID: Audrey Johnson, female   DOB: 1941/01/25, 74 y.o.   MRN: [id number] Patient ID: Audrey Johnson, female   DOB: [birth year], 74 y.o.   MRN: [id number] Patient ID: Audrey Johnson, female   DOB: 1940-09-30, 74 y.o.   MRN: [id number] Patient ID: Audrey Johnson, female   DOB: August 21, 1940, 74 y.o.   MRN: [id number] Patient ID: Audrey Johnson, female   DOB: [birth year], 74 y.o.   MRN: [id number] Patient ID: Audrey Johnson, female   DOB: 1940-12-26, 74 y.o.   MRN: [id number]  Psychiatric Assessment Adult  Patient Identification:  Audrey Johnson Date of Evaluation:  09/03/2014 Chief Complaint: I'm drowsy during the day History of Chief Complaint:   Chief Complaint  Patient presents with  . Depression  . Anxiety  . Follow-up    Anxiety Symptoms include confusion, nausea, nervous/anxious behavior and suicidal ideas.    Altered Mental Status Associated symptoms include abdominal pain and nausea.   this patient is a 74 year old widowed black female who lives with her son in [place]. She's accompanied by her sister Audrey Johnson and her son-in-law Audrey Johnson. I also spoke on the phone to her daughter Audrey Johnson. The patient used to work in a [employer] but has been retired for several years.  The patient was referred by Dr. Nevada Crane, her primary Dr. Apparently she's not had any history of mental health symptoms until about 2 months ago. She started to have severe stomach pain and has gone to several emergency room this in the local area. She's had 9 emergency room visits in the last couple of months. Her last one was yesterday. She's had 3 abdominal CTs in the last month, and ultrasound and blood work. Nothing significantly wrong has been found.  The patient is on numerous medications for her stomach but they don't seem  to help. She was prescribed Lexapro by her primary doctor but she refused to take it.  she is taking a low dose of Xanax  She returns after 3 months . For the most part she is doing well. She is still limping and her knee hurts. Her orthopedic doctor said there was nothing more he could do on the she had back surgery. She is electing not to do this at the moment and her neurosurgeon does not think it is urgent. She did get steroid injections in her back which helped. She feels sleepy during the day and I instructed her to cut down her Xanax that she never did. I will put on her prescription this time to only use it twice a day. The gabapentin may be causing drowsiness but it stiffly helping her back pain so don't want to change this. She continues to gain weight but she is going to try to cut down on her food. It's hard for her to get much exercise because of her knee and back pain Review of Systems  Constitutional: Positive for appetite change and unexpected weight change.  Gastrointestinal: Positive for nausea and abdominal pain.  Psychiatric/Behavioral: Positive for suicidal ideas, hallucinations, behavioral problems, confusion and agitation. The patient is nervous/anxious.    Physical Exam not done  Depressive Symptoms: depressed mood, anhedonia, insomnia, psychomotor agitation, difficulty concentrating, hopelessness, suicidal thoughts with specific plan, anxiety, loss of energy/fatigue, weight loss,  (Hypo) Manic Symptoms:  Elevated Mood:  No Irritable Mood:  Yes Grandiosity:  No Distractibility:  No Labiality of Mood:  Yes Delusions:  Yes Hallucinations:  Yes Impulsivity:  No Sexually Inappropriate Behavior:  No Financial Extravagance:  No Flight of Ideas:  No  Anxiety Symptoms: Excessive Worry:  Yes Panic Symptoms:  No Agoraphobia:  No Obsessive Compulsive: No  Symptoms: None, Specific Phobias:  No Social Anxiety:  No  Psychotic Symptoms:  Hallucinations: Yes  Visual Delusions:  Yes Paranoia:  No   Ideas of Reference:  No  PTSD Symptoms: Ever had a traumatic exposure:  No Had a traumatic exposure in the last month:  No Re-experiencing: No None Hypervigilance:  No Hyperarousal: No None Avoidance: No None  Traumatic Brain Injury: No   Past Psychiatric History: Diagnosis: None   Hospitalizations: None   Outpatient Care: None   Substance Abuse Care: None   Self-Mutilation: None   Suicidal Attempts: None   Violent Behaviors: None    Past Medical History:   Past Medical History  Diagnosis Date  . Hypertension   . High cholesterol   . Chronic back pain   . Anxiety   . Shortness of breath      even walking causes sob...   . Arthritis     in back .   Marland Kitchen Pneumonia   . Borderline diabetes   . Chronic abdominal pain   . Chronic constipation   . Chronic nausea   . Noncompliance with medication regimen   . GERD (gastroesophageal reflux disease)    History of Loss of Consciousness:  No Seizure History:  No Cardiac History:  No Allergies:  No Known Allergies Current Medications:  Current Outpatient Prescriptions  Medication Sig Dispense Refill  . acetaminophen-codeine (TYLENOL #3) 300-30 MG per tablet     . ALPRAZolam (XANAX) 0.25 MG tablet Take 1 tablet (0.25 mg total) by mouth 2 (two) times daily. 60 tablet 2  . escitalopram (LEXAPRO) 10 MG tablet Take 1 tablet (10 mg total) by mouth daily. 90 tablet 2  . gabapentin (NEURONTIN) 100 MG capsule Take 1 capsule (100 mg total) by mouth 3 (three) times daily. 270 capsule 2  . Linaclotide (LINZESS) 145 MCG CAPS capsule Take 1 capsule (145 mcg total) by mouth daily. 30 capsule 11  . mirtazapine (REMERON) 7.5 MG tablet Take 1 tablet (7.5 mg total) by mouth at bedtime. 90 tablet 2  . naproxen (NAPROSYN) 500 MG tablet Take 1 tablet (500 mg total) by mouth 2 (two) times daily. 20 tablet 0  . nebivolol (BYSTOLIC) 5 MG tablet Take 5 mg by mouth daily.    Marland Kitchen omeprazole (PRILOSEC) 20 MG capsule  Take 1 capsule (20 mg total) by mouth 2 (two) times daily before a meal. For acid reflux 60 capsule 5  . omeprazole (PRILOSEC) 20 MG capsule TAKE 2 CAPSULES BY MOUTH TWICE A DAY BEFORE MEALS 60 capsule 5  . PROAIR HFA 108 (90 BASE) MCG/ACT inhaler      No current facility-administered medications for this visit.    Previous Psychotropic Medications:  Medication Dose   Xanax   0.25 mg every 8 hours as needed                      Substance Abuse History in the last 12 months: Substance Age of 1st Use Last Use Amount Specific Type  Nicotine      Alcohol      Cannabis      Opiates  Cocaine      Methamphetamines      LSD      Ecstasy      Benzodiazepines      Caffeine      Inhalants      Others:                          Medical Consequences of Substance Abuse: n/a  Legal Consequences of Substance Abuse: n/a  Family Consequences of Substance Abuse: n/a  Blackouts:  No DT's:  No Withdrawal Symptoms:  No None  Social History: Current Place of Residence: [place] of Birth: Unknown Family Members: Lives with son Marital Status:  Widowed Children:   Sons: 1  Daughters: 1 Relationships:  Education:  [employer] Problems/Performance:  Religious Beliefs/Practices: Unknown History of Abuse: none Pensions consultant; Manufacturing engineer History:  None. Legal History: None Hobbies/Interests: Church  Family History:   Family History  Problem Relation Age of Onset  . Colon cancer Neg Hx     Mental Status Examination/Evaluation: Objective:  Appearance:   She is neatly dressed, pleasant but limping , walking with a cane   Eye Contact::  Good   Speech:  Normal   Volume:  Decreased  Mood: Good  Affect:  Euthymic   Thought Process:  Organized   Orientation:  Full (Time, Place, and Person)  Thought Content:  No delusions or hallucinations   Suicidal Thoughts:  No  Homicidal Thoughts:  No  Judgement:  Impaired   Insight:  Lacking  Psychomotor Activity:  Normal   Akathisia:  No  Handed:  Right  AIMS (if indicated):    Assets:  Social Support    Laboratory/X-Ray Psychological Evaluation(s)        Assessment:  Maj. depression, single episode with psychotic features and somatization disorder  AXIS I Major Depression, single episode with psychotic features, and somatization disorder   AXIS II Deferred  AXIS III Past Medical History  Diagnosis Date  . Hypertension   . High cholesterol   . Chronic back pain   . Anxiety   . Shortness of breath      even walking causes sob...   . Arthritis     in back .   Marland Kitchen Pneumonia   . Borderline diabetes   . Chronic abdominal pain   . Chronic constipation   . Chronic nausea   . Noncompliance with medication regimen   . GERD (gastroesophageal reflux disease)      AXIS IV other psychosocial or environmental problems  AXIS V 11-20 some danger of hurting self or others possible OR occasionally fails to maintain minimal personal hygiene OR gross impairment in communication   Treatment Plan/Recommendations:  Plan of Care: Medication management   Laboratory:    Psychotherapy: She is scheduled to see Maurice Small   Medications: The patient will continue mirtazapine 7.5 mg each bedtime as this helped her sleep. She'll continue Lexapro 10 mg every morning  and continue Neurontin 100 mg 3 times a day. She will decrease Xanax 0.25 mg to 2 times a day   Routine PRN Medications:  No  Consultations:   Safety Concerns:    Other:  She'll return in 3 months    [name], MD 3/4/20161:20 PM

## 2014-10-06 ENCOUNTER — Other Ambulatory Visit: Payer: Self-pay | Admitting: Orthopedic Surgery

## 2014-10-21 DIAGNOSIS — I10 Essential (primary) hypertension: Secondary | ICD-10-CM | POA: Diagnosis not present

## 2014-10-21 DIAGNOSIS — E782 Mixed hyperlipidemia: Secondary | ICD-10-CM | POA: Diagnosis not present

## 2014-10-21 DIAGNOSIS — E1165 Type 2 diabetes mellitus with hyperglycemia: Secondary | ICD-10-CM | POA: Diagnosis not present

## 2014-10-25 ENCOUNTER — Telehealth (HOSPITAL_COMMUNITY): Payer: Self-pay | Admitting: *Deleted

## 2014-11-01 DIAGNOSIS — E1165 Type 2 diabetes mellitus with hyperglycemia: Secondary | ICD-10-CM | POA: Diagnosis not present

## 2014-11-01 DIAGNOSIS — I10 Essential (primary) hypertension: Secondary | ICD-10-CM | POA: Diagnosis not present

## 2014-11-01 DIAGNOSIS — M549 Dorsalgia, unspecified: Secondary | ICD-10-CM | POA: Diagnosis not present

## 2014-11-01 DIAGNOSIS — E782 Mixed hyperlipidemia: Secondary | ICD-10-CM | POA: Diagnosis not present

## 2014-11-11 ENCOUNTER — Other Ambulatory Visit (HOSPITAL_COMMUNITY): Payer: Self-pay | Admitting: Psychiatry

## 2014-11-18 DIAGNOSIS — H2589 Other age-related cataract: Secondary | ICD-10-CM | POA: Diagnosis not present

## 2014-12-02 ENCOUNTER — Encounter (HOSPITAL_COMMUNITY): Payer: Self-pay | Admitting: Psychiatry

## 2014-12-02 ENCOUNTER — Ambulatory Visit (INDEPENDENT_AMBULATORY_CARE_PROVIDER_SITE_OTHER): Payer: Commercial Managed Care - HMO | Admitting: Psychiatry

## 2014-12-02 VITALS — BP 133/71 | HR 75 | Ht 64.0 in | Wt 184.6 lb

## 2014-12-02 DIAGNOSIS — F329 Major depressive disorder, single episode, unspecified: Secondary | ICD-10-CM | POA: Diagnosis not present

## 2014-12-02 DIAGNOSIS — F459 Somatoform disorder, unspecified: Secondary | ICD-10-CM | POA: Diagnosis not present

## 2014-12-02 DIAGNOSIS — F32A Depression, unspecified: Secondary | ICD-10-CM

## 2014-12-02 MED ORDER — GABAPENTIN 100 MG PO CAPS: 100.0000 mg | ORAL_CAPSULE | Freq: Three times a day (TID) | ORAL | Status: DC

## 2014-12-02 MED ORDER — ALPRAZOLAM 0.25 MG PO TABS: 0.2500 mg | ORAL_TABLET | Freq: Two times a day (BID) | ORAL | Status: DC

## 2014-12-02 MED ORDER — ESCITALOPRAM OXALATE 10 MG PO TABS: 10.0000 mg | ORAL_TABLET | Freq: Every day | ORAL | Status: DC

## 2014-12-02 MED ORDER — MIRTAZAPINE 7.5 MG PO TABS: 7.5000 mg | ORAL_TABLET | Freq: Every day | ORAL | Status: DC

## 2014-12-02 NOTE — Progress Notes (Signed)
Patient ID: MACKENSIE PILSON, female   DOB: [birth year], 74 y.o.   MRN: [id number] Patient ID: MAYSON STERBENZ, female   DOB: 06-21-1941, 68 y.o.   MRN: [id number] Patient ID: LESSLIE MOSSA, female   DOB: [birth year], 74 y.o.   MRN: [id number] Patient ID: SARAHANNE NOVAKOWSKI, female   DOB: [birth year], 74 y.o.   MRN: [id number] Patient ID: LINDYN VOSSLER, female   DOB: [birth year], 74 y.o.   MRN: [id number] Patient ID: KEWANDA POLAND, female   DOB: [birth year], 74 y.o.   MRN: [id number] Patient ID: KINZE LABO, female   DOB: Oct 01, 1940, 74 y.o.   MRN: [id number] Patient ID: YONA KOSEK, female   DOB: 1940/11/15, 74 y.o.   MRN: [id number] Patient ID: ADELL KOVAL, female   DOB: [birth year], 74 y.o.   MRN: [id number]  Psychiatric Assessment Adult  Patient Identification:  ALYSABETH SCALIA Date of Evaluation:  12/02/2014 Chief Complaint: I'm drowsy during the day History of Chief Complaint:   Chief Complaint  Patient presents with  . Depression  . Anxiety  . Follow-up    Anxiety    Altered Mental Status Associated symptoms include joint swelling.   this patient is a 74 year old widowed black female who lives with her son in [place]. She's accompanied by her sister [name] and her son-in-law [name]. I also spoke on the phone to her daughter [name]. The patient used to work in a [employer] but has been retired for several years.  The patient was referred by Dr. Nevada Crane, her primary Dr. Apparently she's not had any history of mental health symptoms until about 2 months ago. She started to have severe stomach pain and has gone to several emergency room this in the local area. She's had 9 emergency room visits in the last couple of months. Her last one was yesterday. She's had 3 abdominal CTs in the last month, and ultrasound and blood work. Nothing significantly wrong has been found.  The patient is on numerous medications for her stomach but they don't seem to  help. She was prescribed Lexapro by her primary doctor but she refused to take it.  she is taking a low dose of Xanax  She returns after 3 months . For the most part she is doing well. She is still limping and her knee hurts. Her orthopedic doctor said there was nothing more he could do on the she had back surgery. She already to do this at the moment. She states that she has had less appetite recently and has lost 10 pounds. However she denies being depressed or having current abdominal pain. Her mood is been pretty good and she's been trying to do things in her garden and around the house. She denies auditory or visual hallucinations or suicidal ideation. Review of Systems  Constitutional: Positive for appetite change and unexpected weight change.  Musculoskeletal: Positive for back pain and joint swelling.   Physical Exam not done  Depressive Symptoms: depressed mood, anhedonia, insomnia, psychomotor agitation, difficulty concentrating, hopelessness, suicidal thoughts with specific plan, anxiety, loss of energy/fatigue, weight loss,  (Hypo) Manic Symptoms:   Elevated Mood:  No Irritable Mood:  Yes Grandiosity:  No Distractibility:  No Labiality of Mood:  Yes Delusions:  Yes Hallucinations:  Yes Impulsivity:  No Sexually Inappropriate Behavior:  No Financial Extravagance:  No Flight of Ideas:  No  Anxiety Symptoms: Excessive Worry:  Yes Panic Symptoms:  No Agoraphobia:  No Obsessive Compulsive: No  Symptoms: None, Specific Phobias:  No Social Anxiety:  No  Psychotic Symptoms:  Hallucinations: Yes Visual Delusions:  Yes Paranoia:  No   Ideas of Reference:  No  PTSD Symptoms: Ever had a traumatic exposure:  No Had a traumatic exposure in the last month:  No Re-experiencing: No None Hypervigilance:  No Hyperarousal: No None Avoidance: No None  Traumatic Brain Injury: No   Past Psychiatric History: Diagnosis: None   Hospitalizations: None   Outpatient Care:  None   Substance Abuse Care: None   Self-Mutilation: None   Suicidal Attempts: None   Violent Behaviors: None    Past Medical History:   Past Medical History  Diagnosis Date  . Hypertension   . High cholesterol   . Chronic back pain   . Anxiety   . Shortness of breath      even walking causes sob...   . Arthritis     in back .   Marland Kitchen Pneumonia   . Borderline diabetes   . Chronic abdominal pain   . Chronic constipation   . Chronic nausea   . Noncompliance with medication regimen   . GERD (gastroesophageal reflux disease)    History of Loss of Consciousness:  No Seizure History:  No Cardiac History:  No Allergies:  No Known Allergies Current Medications:  Current Outpatient Prescriptions  Medication Sig Dispense Refill  . acetaminophen-codeine (TYLENOL #3) 300-30 MG per tablet     . ALPRAZolam (XANAX) 0.25 MG tablet Take 1 tablet (0.25 mg total) by mouth 2 (two) times daily. 60 tablet 2  . escitalopram (LEXAPRO) 10 MG tablet Take 1 tablet (10 mg total) by mouth daily. 90 tablet 2  . gabapentin (NEURONTIN) 100 MG capsule Take 1 capsule (100 mg total) by mouth 3 (three) times daily. 270 capsule 2  . mirtazapine (REMERON) 7.5 MG tablet Take 1 tablet (7.5 mg total) by mouth at bedtime. 90 tablet 2  . naproxen (NAPROSYN) 500 MG tablet Take 1 tablet (500 mg total) by mouth 2 (two) times daily. 20 tablet 0  . nebivolol (BYSTOLIC) 5 MG tablet Take 5 mg by mouth daily.    Marland Kitchen omeprazole (PRILOSEC) 20 MG capsule TAKE 2 CAPSULES BY MOUTH TWICE A DAY BEFORE MEALS 60 capsule 5  . PROAIR HFA 108 (90 BASE) MCG/ACT inhaler     . traMADol (ULTRAM) 50 MG tablet Take 50 mg by mouth every 6 (six) hours as needed.     No current facility-administered medications for this visit.    Previous Psychotropic Medications:  Medication Dose   Xanax   0.25 mg every 8 hours as needed                      Substance Abuse History in the last 12 months: Substance Age of 1st Use Last Use Amount  Specific Type  Nicotine      Alcohol      Cannabis      Opiates      Cocaine      Methamphetamines      LSD      Ecstasy      Benzodiazepines      Caffeine      Inhalants      Others:                          Medical Consequences of Substance Abuse: n/a  Legal Consequences of Substance Abuse: n/a  Family Consequences of Substance Abuse: n/a  Blackouts:  No DT's:  No Withdrawal Symptoms:  No None  Social History: Current Place of Residence: [place] of Birth: Unknown Family Members: Lives with son Marital Status:  Widowed Children:   Sons: 1  Daughters: 1 Relationships:  Education:  [employer] Problems/Performance:  Religious Beliefs/Practices: Unknown History of Abuse: none Pensions consultant; Manufacturing engineer History:  None. Legal History: None Hobbies/Interests: Church  Family History:   Family History  Problem Relation Age of Onset  . Colon cancer Neg Hx     Mental Status Examination/Evaluation: Objective:  Appearance:   She is neatly dressed, pleasant but limping , walking with a cane   Eye Contact::  Good   Speech:  Normal   Volume:  Decreased  Mood: Good  Affect:  Euthymic   Thought Process:  Organized   Orientation:  Full (Time, Place, and Person)  Thought Content:  No delusions or hallucinations   Suicidal Thoughts:  No  Homicidal Thoughts:  No  Judgement:  Impaired  Insight:  Lacking  Psychomotor Activity:  Normal   Akathisia:  No  Handed:  Right  AIMS (if indicated):    Assets:  [place] of knowledge memory and language are all good as well  Oceanographer)        Assessment:  Maj. depression, single episode with psychotic features and somatization disorder  AXIS I Major Depression, single episode with psychotic features, and somatization disorder   AXIS II Deferred  AXIS III Past Medical History  Diagnosis Date  . Hypertension   . High  cholesterol   . Chronic back pain   . Anxiety   . Shortness of breath      even walking causes sob...   . Arthritis     in back .   Marland Kitchen Pneumonia   . Borderline diabetes   . Chronic abdominal pain   . Chronic constipation   . Chronic nausea   . Noncompliance with medication regimen   . GERD (gastroesophageal reflux disease)      AXIS IV other psychosocial or environmental problems  AXIS V 11-20 some danger of hurting self or others possible OR occasionally fails to maintain minimal personal hygiene OR gross impairment in communication   Treatment Plan/Recommendations:  Plan of Care: Medication management   Laboratory:    Psychotherapy: She is scheduled to see Maurice Small   Medications: The patient will continue mirtazapine 7.5 mg each bedtime as this helped her sleep. She'll continue Lexapro 10 mg every morning for depression  and continue Neurontin 100 mg 3 times a day for anxiety She will continue Xanax 0.25 mg 2 times a day also for anxiety   Routine PRN Medications:  No  Consultations:   Safety Concerns:  She denies any thoughts or plans to hurt self or others   Other:  She'll return in 3 months    [name], MD 6/2/201610:52 AM

## 2014-12-03 ENCOUNTER — Ambulatory Visit (HOSPITAL_COMMUNITY): Payer: Self-pay | Admitting: Psychiatry

## 2014-12-13 ENCOUNTER — Ambulatory Visit: Payer: Medicaid Other | Admitting: Orthopedic Surgery

## 2015-01-14 ENCOUNTER — Other Ambulatory Visit (HOSPITAL_COMMUNITY): Payer: Self-pay | Admitting: Psychiatry

## 2015-01-15 IMAGING — CT CT ABD-PELV W/ CM
2 of 4 series · 16 of 46 positions shown, 18 images · IV contrast (Omnipaque 300)
Comparison: CT of the abdomen and pelvis performed 03/26/2013

CLINICAL DATA: Abdominal pain and vomiting.

CT ABDOMEN AND PELVIS WITH CONTRAST
TECHNIQUE: Multidetector CT imaging of the abdomen and pelvis was
performed following the standard protocol during bolus
administration of intravenous contrast.
Contrast: 100 mL of Omnipaque 300 IV contrast

[Series 2: abd_pel_with 5.0 b40f · axial · 0.64mm/px · z∈[+280,+674]mm · 13 of 89 slices shown, 15 images]
[im 5/89  soft-tissue]
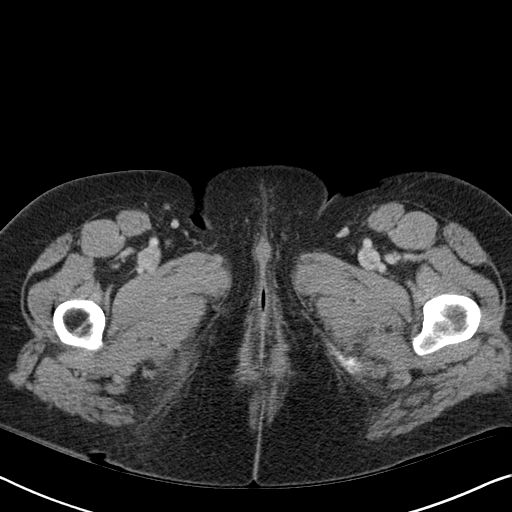
[im 5/89  bone]
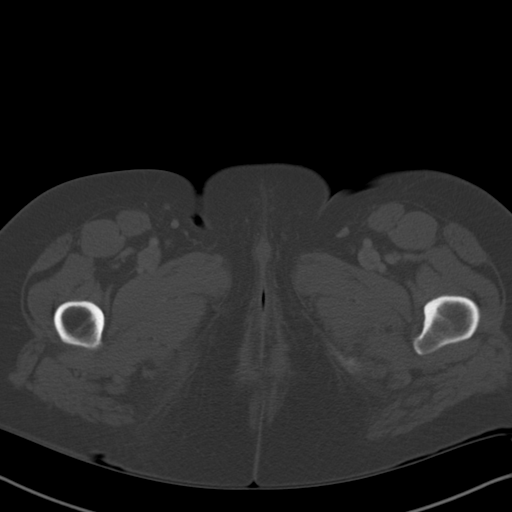
[im 13/89  soft-tissue]
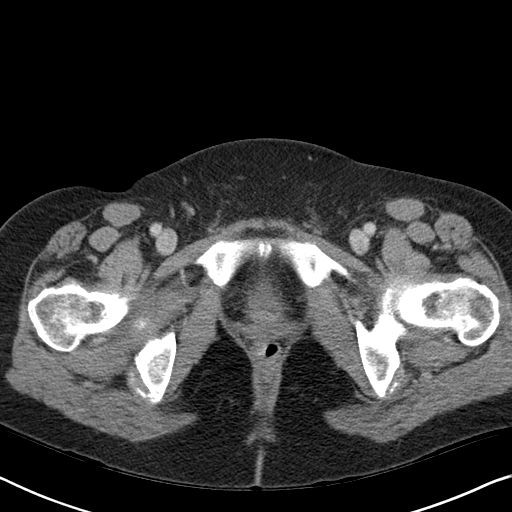
[im 17/89  soft-tissue]
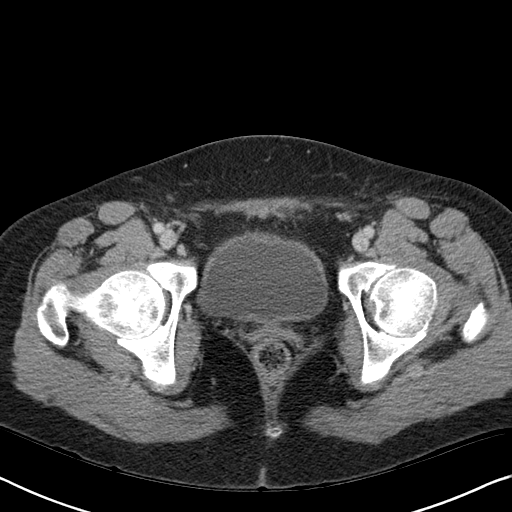
[im 26/89  soft-tissue]
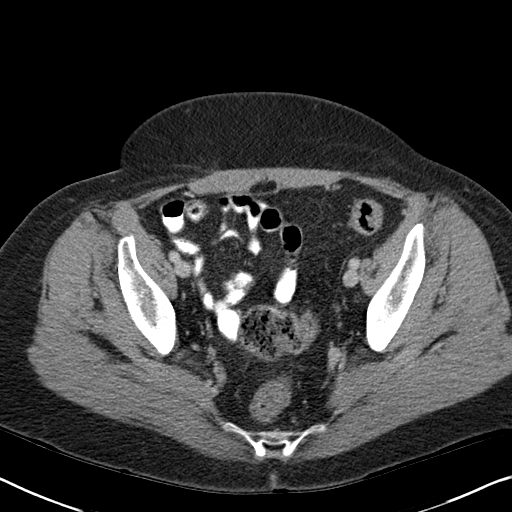
[im 30/89  soft-tissue]
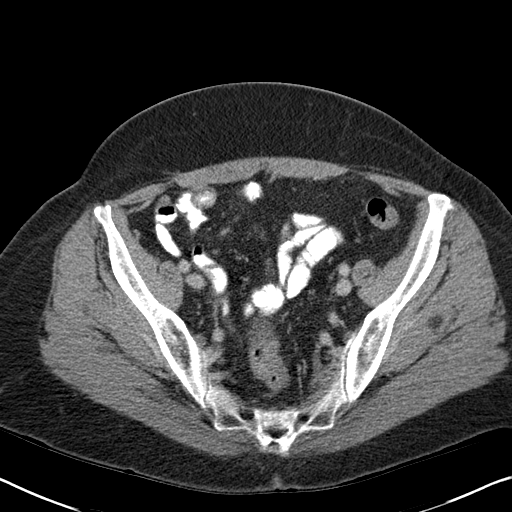
[im 38/89  soft-tissue]
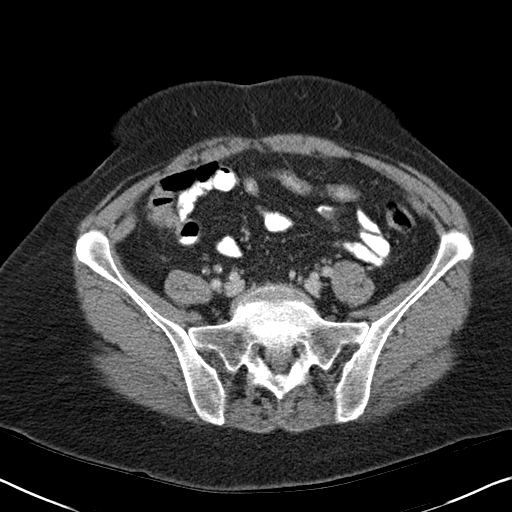
[im 47/89  soft-tissue]
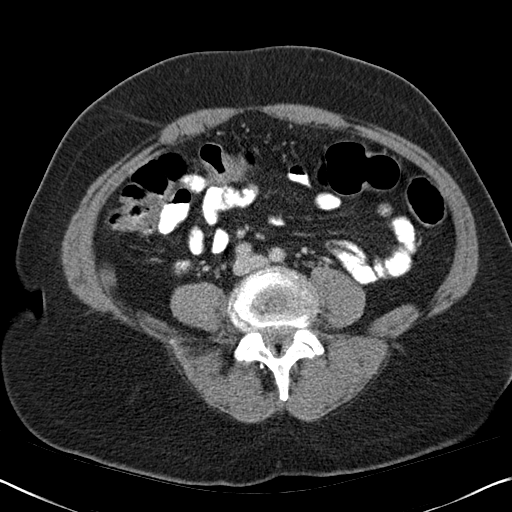
[im 51/89  soft-tissue]
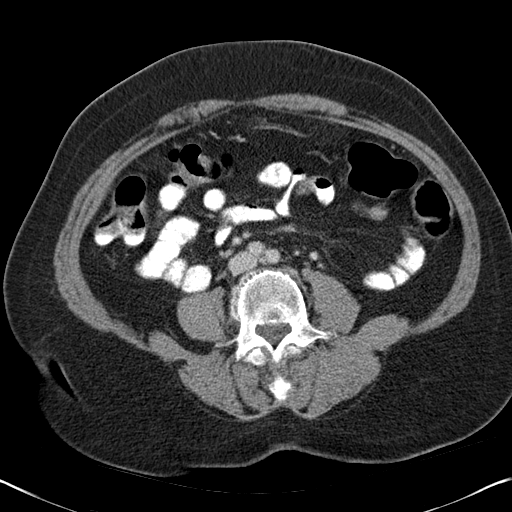
[im 59/89  soft-tissue]
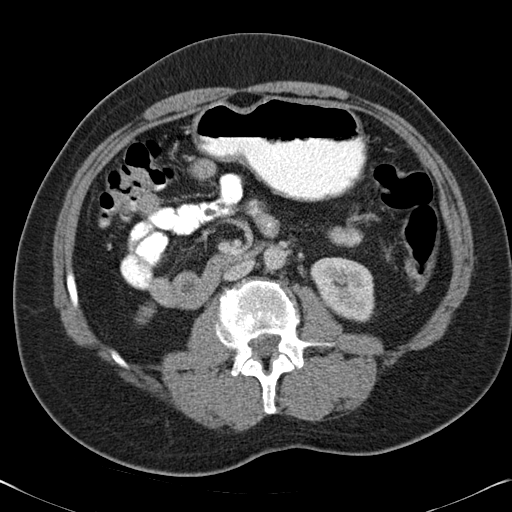
[im 59/89  bone]
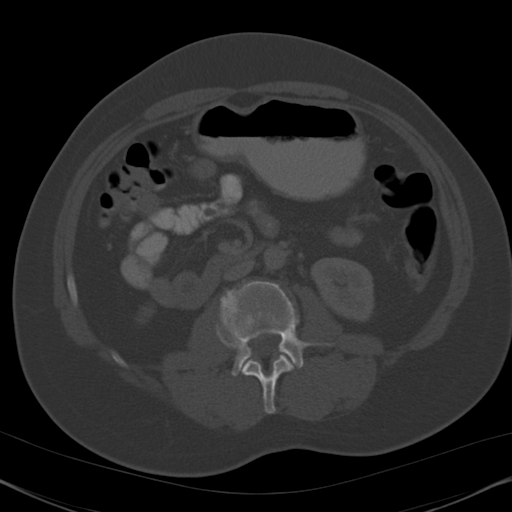
[im 63/89  soft-tissue]
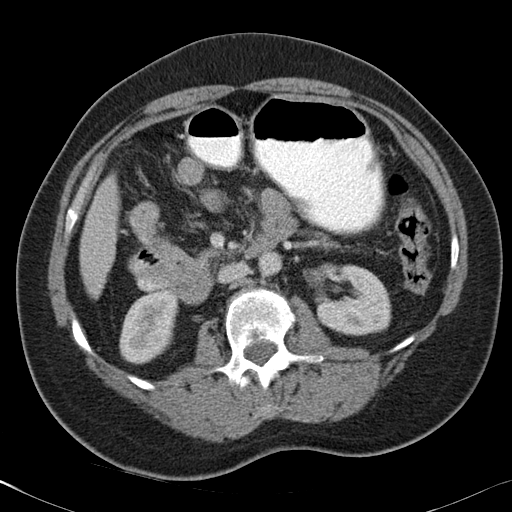
[im 72/89  soft-tissue]
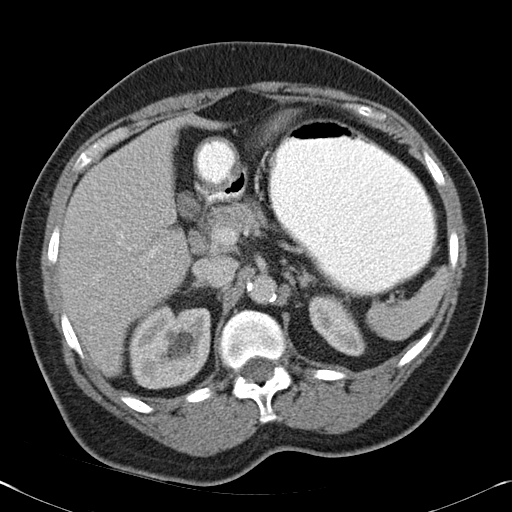
[im 76/89  soft-tissue]
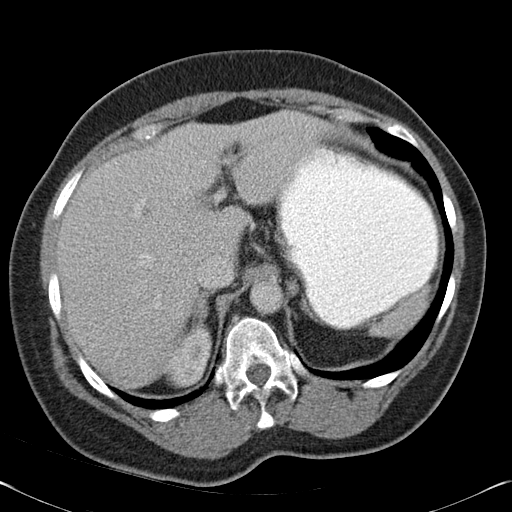
[im 84/89  soft-tissue]
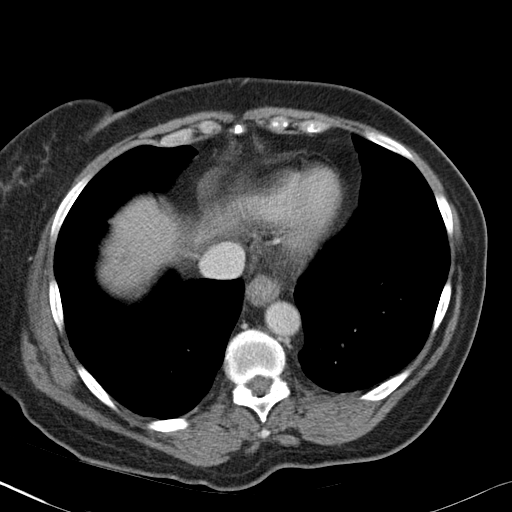

[Series 4: abd_pel_with 3.0 spo cor · coronal · 0.72mm/px · 3 of 103 slices shown]
[im 35/103  soft-tissue]
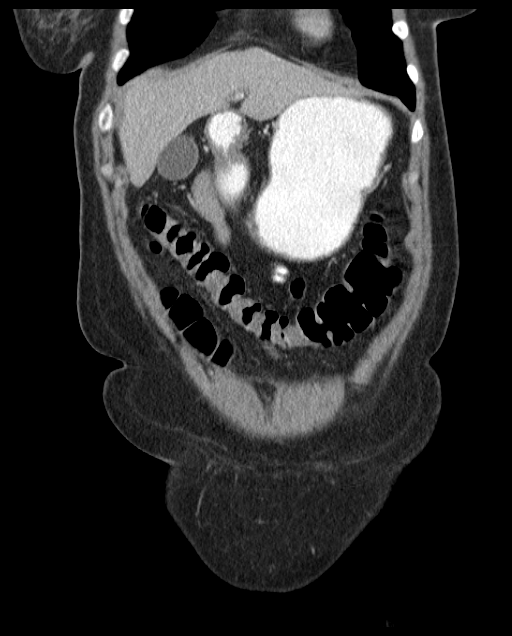
[im 46/103  soft-tissue]
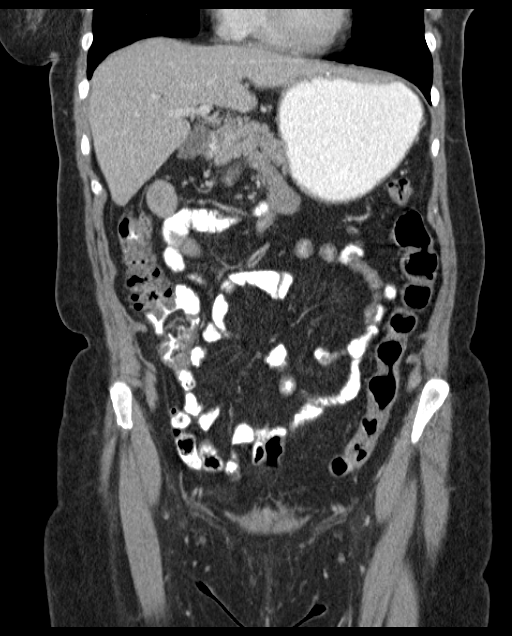
[im 57/103  soft-tissue]
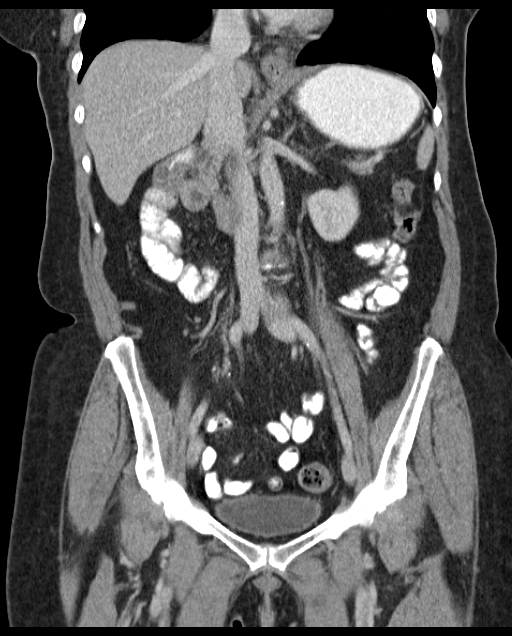

[16 of 46 positions shown; findings below may reference images not displayed]

FINDINGS: The visualized lung bases are clear.

The liver and spleen are unremarkable in appearance.  The
gallbladder is within normal limits.  The pancreas and adrenal
glands are unremarkable.

There is minimal right-sided hydronephrosis, of uncertain
significance; this appears similar to the prior study.  There is no
evidence of distal obstruction.  The left kidney is unremarkable in
appearance.  No perinephric stranding is appreciated.  A 1.2 cm
cyst is noted at the upper pole of the right kidney.

No free fluid is identified.  The small bowel is unremarkable in
appearance.  The stomach is within normal limits.  No acute
vascular abnormalities are seen.  Minimal calcification is noted
along the abdominal aorta.

The appendix is normal in caliber and contains air, without
evidence for appendicitis.  Contrast progresses to the left of the
transverse colon.  The colon is unremarkable in appearance.

The bladder is mildly distended and grossly unremarkable in
appearance.  The patient is status post hysterectomy.  No
suspicious adnexal masses are seen.  No inguinal lymphadenopathy is
seen.

No acute osseous abnormalities are identified.  Intervertebral disc
space narrowing is noted at L1-L2, with associated vacuum
phenomenon and sclerotic change.
IMPRESSION: 1.  No acute abnormality seen within the abdomen or pelvis.
2.  Minimal chronic right-sided hydronephrosis, without evidence of
distal obstruction.
3.  Small right renal cyst seen.

## 2015-01-17 DIAGNOSIS — H25812 Combined forms of age-related cataract, left eye: Secondary | ICD-10-CM | POA: Diagnosis not present

## 2015-01-17 DIAGNOSIS — H40013 Open angle with borderline findings, low risk, bilateral: Secondary | ICD-10-CM | POA: Diagnosis not present

## 2015-01-17 DIAGNOSIS — H25813 Combined forms of age-related cataract, bilateral: Secondary | ICD-10-CM | POA: Diagnosis not present

## 2015-01-26 DIAGNOSIS — M545 Low back pain: Secondary | ICD-10-CM | POA: Diagnosis not present

## 2015-02-04 DIAGNOSIS — E782 Mixed hyperlipidemia: Secondary | ICD-10-CM | POA: Diagnosis not present

## 2015-02-04 DIAGNOSIS — I10 Essential (primary) hypertension: Secondary | ICD-10-CM | POA: Diagnosis not present

## 2015-02-04 DIAGNOSIS — E1165 Type 2 diabetes mellitus with hyperglycemia: Secondary | ICD-10-CM | POA: Diagnosis not present

## 2015-02-11 NOTE — Patient Instructions (Signed)
Your procedure is scheduled on:  02/21/2015  Report to [facility] at 11:30      AM.  Call this number if you have problems the morning of surgery: [phone]   Remember:   Do not eat or drink :After Midnight.    Take these medicines the morning of surgery with A SIP OF WATER: Nebivolol (Bystolic), Omeprazole, Lexapro, xanax, and use inhaler Pro Air   Do not wear jewelry, make-up or nail polish.  Do not wear lotions, powders, or perfumes. You may wear deodorant.  Do not shave 48 hours prior to surgery.  Do not bring valuables to the hospital.  Contacts, dentures or bridgework may not be worn into surgery.  Patients discharged the day of surgery will not be allowed to drive home.  Name and phone number of your driver:    Please read over the following fact sheets that you were given: Pain Booklet, Surgical Site Infection Prevention, Anesthesia Post-op Instructions and Care and Recovery After Surgery  Cataract Surgery  A cataract is a clouding of the lens of the eye. When a lens becomes cloudy, vision is reduced based on the degree and nature of the clouding. Surgery may be needed to improve vision. Surgery removes the cloudy lens and usually replaces it with a substitute lens (intraocular lens, IOL). LET YOUR EYE DOCTOR KNOW ABOUT:  Allergies to food or medicine.   Medicines taken including herbs, eyedrops, over-the-counter medicines, and creams.   Use of steroids (by mouth or creams).   Previous problems with anesthetics or numbing medicine.   History of bleeding problems or blood clots.   Previous surgery.   Other health problems, including diabetes and kidney problems.   Possibility of pregnancy, if this applies.  RISKS AND COMPLICATIONS  Infection.   Inflammation of the eyeball (endophthalmitis) that can spread to both eyes (sympathetic ophthalmia).   Poor wound healing.   If an IOL is inserted, it can later fall out of proper position. This is very uncommon.    Clouding of the part of your eye that holds an IOL in place. This is called an "after-cataract." These are uncommon, but easily treated.  BEFORE THE PROCEDURE  Do not eat or drink anything except small amounts of water for 8 to 12 before your surgery, or as directed by your caregiver.   Unless you are told otherwise, continue any eyedrops you have been prescribed.   Talk to your primary caregiver about all other medicines that you take (both prescription and non-prescription). In some cases, you may need to stop or change medicines near the time of your surgery. This is most important if you are taking blood-thinning medicine.Do not stop medicines unless you are told to do so.   Arrange for someone to drive you to and from the procedure.   Do not put contact lenses in either eye on the day of your surgery.  PROCEDURE There is more than one method for safely removing a cataract. Your doctor can explain the differences and help determine which is best for you. Phacoemulsification surgery is the most common form of cataract surgery.  An injection is given behind the eye or eyedrops are given to make this a painless procedure.   A small cut (incision) is made on the edge of the clear, dome-shaped surface that covers the front of the eye (cornea).   A tiny probe is painlessly inserted into the eye. This device gives off ultrasound waves that soften and break up the  cloudy center of the lens. This makes it easier for the cloudy lens to be removed by suction.   An IOL may be implanted.   The normal lens of the eye is covered by a clear capsule. Part of that capsule is intentionally left in the eye to support the IOL.   Your surgeon may or may not use stitches to close the incision.  There are other forms of cataract surgery that require a larger incision and stiches to close the eye. This approach is taken in cases where the doctor feels that the cataract cannot be easily removed using  phacoemulsification. AFTER THE PROCEDURE  When an IOL is implanted, it does not need care. It becomes a permanent part of your eye and cannot be seen or felt.   Your doctor will schedule follow-up exams to check on your progress.   Review your other medicines with your doctor to see which can be resumed after surgery.   Use eyedrops or take medicine as prescribed by your doctor.  Document Released: 06/07/2011 Document Reviewed: 06/04/2011 [facility] Patient Information 2012 [place].  .Cataract Surgery Care After Refer to this sheet in the next few weeks. These instructions provide you with information on caring for yourself after your procedure. Your caregiver may also give you more specific instructions. Your treatment has been planned according to current medical practices, but problems sometimes occur. Call your caregiver if you have any problems or questions after your procedure.  HOME CARE INSTRUCTIONS   Avoid strenuous activities as directed by your caregiver.   Ask your caregiver when you can resume driving.   Use eyedrops or other medicines to help healing and control pressure inside your eye as directed by your caregiver.   Only take over-the-counter or prescription medicines for pain, discomfort, or fever as directed by your caregiver.   Do not to touch or rub your eyes.   You may be instructed to use a protective shield during the first few days and nights after surgery. If not, wear sunglasses to protect your eyes. This is to protect the eye from pressure or from being accidentally bumped.   Keep the area around your eye clean and dry. Avoid swimming or allowing water to hit you directly in the face while showering. Keep soap and shampoo out of your eyes.   Do not bend or lift heavy objects. Bending increases pressure in the eye. You can walk, climb stairs, and do light household chores.   Do not put a contact lens into the eye that had surgery until your caregiver  says it is okay to do so.   Ask your doctor when you can return to work. This will depend on the kind of work that you do. If you work in a dusty environment, you may be advised to wear protective eyewear for a period of time.   Ask your caregiver when it will be safe to engage in sexual activity.   Continue with your regular eye exams as directed by your caregiver.  What to expect:  It is normal to feel itching and mild discomfort for a few days after cataract surgery. Some fluid discharge is also common, and your eye may be sensitive to light and touch.   After 1 to 2 days, even moderate discomfort should disappear. In most cases, healing will take about 6 weeks.   If you received an intraocular lens (IOL), you may notice that colors are very bright or have a blue tinge. Also,  if you have been in bright sunlight, everything may appear reddish for a few hours. If you see these color tinges, it is because your lens is clear and no longer cloudy. Within a few months after receiving an IOL, these extra colors should go away. When you have healed, you will probably need new glasses.  SEEK MEDICAL CARE IF:   You have increased bruising around your eye.   You have discomfort not helped by medicine.  SEEK IMMEDIATE MEDICAL CARE IF:   You have a fever.   You have a worsening or sudden vision loss.   You have redness, swelling, or increasing pain in the eye.   You have a thick discharge from the eye that had surgery.  MAKE SURE YOU:  Understand these instructions.   Will watch your condition.   Will get help right away if you are not doing well or get worse.  Document Released: 01/05/2005 Document Revised: 06/07/2011 Document Reviewed: 02/09/2011 [facility] Patient Information 2012 [place].    Monitored Anesthesia Care  Monitored anesthesia care is an anesthesia service for a medical procedure. Anesthesia is the loss of the ability to feel pain. It is produced by medications  called anesthetics. It may affect a small area of your body (local anesthesia), a large area of your body (regional anesthesia), or your entire body (general anesthesia). The need for monitored anesthesia care depends your procedure, your condition, and the potential need for regional or general anesthesia. It is often provided during procedures where:   General anesthesia may be needed if there are complications. This is because you need special care when you are under general anesthesia.   You will be under local or regional anesthesia. This is so that you are able to have higher levels of anesthesia if needed.   You will receive calming medications (sedatives). This is especially the case if sedatives are given to put you in a semi-conscious state of relaxation (deep sedation). This is because the amount of sedative needed to produce this state can be hard to predict. Too much of a sedative can produce general anesthesia. Monitored anesthesia care is performed by one or more caregivers who have special training in all types of anesthesia. You will need to meet with these caregivers before your procedure. During this meeting, they will ask you about your medical history. They will also give you instructions to follow. (For example, you will need to stop eating and drinking before your procedure. You may also need to stop or change medications you are taking.) During your procedure, your caregivers will stay with you. They will:   Watch your condition. This includes watching you blood pressure, breathing, and level of pain.   Diagnose and treat problems that occur.   Give medications if they are needed. These may include calming medications (sedatives) and anesthetics.   Make sure you are comfortable.  Having monitored anesthesia care does not necessarily mean that you will be under anesthesia. It does mean that your caregivers will be able to manage anesthesia if you need it or if it occurs.  It also means that you will be able to have a different type of anesthesia than you are having if you need it. When your procedure is complete, your caregivers will continue to watch your condition. They will make sure any medications wear off before you are allowed to go home.  Document Released: 03/14/2005 Document Revised: 10/13/2012 Document Reviewed: 07/30/2012 [facility] Patient Information 2014 [place], [place].

## 2015-02-14 ENCOUNTER — Encounter (HOSPITAL_COMMUNITY)
Admission: RE | Admit: 2015-02-14 | Discharge: 2015-02-14 | Disposition: A | Discharge status: Home / Self Care | Payer: Medicare Other | Source: Ambulatory Visit | Attending: Ophthalmology | Admitting: Ophthalmology

## 2015-02-14 ENCOUNTER — Encounter (HOSPITAL_COMMUNITY): Payer: Self-pay

## 2015-02-14 DIAGNOSIS — H2512 Age-related nuclear cataract, left eye: Secondary | ICD-10-CM | POA: Insufficient documentation

## 2015-02-14 DIAGNOSIS — Z01818 Encounter for other preprocedural examination: Secondary | ICD-10-CM | POA: Diagnosis not present

## 2015-02-14 HISTORY — DX: Other specified health status: Z78.9

## 2015-02-14 LAB — BASIC METABOLIC PANEL
Anion gap: 8 (ref 5–15)
BUN: 11 mg/dL (ref 6–20)
CO2: 19 mmol/L — ABNORMAL LOW (ref 22–32)
Calcium: 8.9 mg/dL (ref 8.9–10.3)
Chloride: 112 mmol/L — ABNORMAL HIGH (ref 101–111)
Creatinine, Ser: 1.18 mg/dL — ABNORMAL HIGH (ref 0.44–1.00)
GFR calc Af Amer: 52 mL/min — ABNORMAL LOW (ref >60)
GFR calc non Af Amer: 45 mL/min — ABNORMAL LOW (ref >60)
Glucose, Bld: 97 mg/dL (ref 65–99)
Potassium: 4 mmol/L (ref 3.5–5.1)
Sodium: 139 mmol/L (ref 135–145)

## 2015-02-14 LAB — CBC
HCT: 40.1 % (ref 36.0–46.0)
Hemoglobin: 13 g/dL (ref 12.0–15.0)
MCH: 29.6 pg (ref 26.0–34.0)
MCHC: 32.4 g/dL (ref 30.0–36.0)
MCV: 91.3 fL (ref 78.0–100.0)
Platelets: 360 10*3/uL (ref 150–400)
RBC: 4.39 MIL/uL (ref 3.87–5.11)
RDW: 14.5 % (ref 11.5–15.5)
WBC: 5.5 10*3/uL (ref 4.0–10.5)

## 2015-02-14 LAB — SURGICAL PCR SCREEN
MRSA, PCR: NEGATIVE
Staphylococcus aureus: POSITIVE — AB

## 2015-02-14 NOTE — Pre-Procedure Instructions (Signed)
Patient given information to sign up for my chart at home. 

## 2015-02-18 MED ORDER — LIDOCAINE HCL (PF) 1 % IJ SOLN
INTRAMUSCULAR | Status: AC
  Filled 2015-02-18: qty 2

## 2015-02-18 MED ORDER — LIDOCAINE HCL 3.5 % OP GEL
OPHTHALMIC | Status: AC
  Filled 2015-02-18: qty 1

## 2015-02-18 MED ORDER — TETRACAINE HCL 0.5 % OP SOLN
OPHTHALMIC | Status: AC
  Filled 2015-02-18: qty 2

## 2015-02-18 MED ORDER — PHENYLEPHRINE HCL 2.5 % OP SOLN
OPHTHALMIC | Status: AC
  Filled 2015-02-18: qty 15

## 2015-02-18 MED ORDER — NEOMYCIN-POLYMYXIN-DEXAMETH 3.5-10000-0.1 OP SUSP
OPHTHALMIC | Status: AC
  Filled 2015-02-18: qty 5

## 2015-02-18 MED ORDER — CYCLOPENTOLATE-PHENYLEPHRINE OP SOLN OPTIME - NO CHARGE
OPHTHALMIC | Status: AC
  Filled 2015-02-18: qty 2

## 2015-02-18 NOTE — Progress Notes (Signed)
Patient called with nasal swab results, positive for staph. Pt unable to come and pick up med. We will start day of procedure.

## 2015-02-21 ENCOUNTER — Ambulatory Visit (HOSPITAL_COMMUNITY): Payer: Medicare Other | Admitting: Anesthesiology

## 2015-02-21 ENCOUNTER — Encounter (HOSPITAL_COMMUNITY)
Admission: RE | Disposition: A | Discharge status: Home / Self Care | Payer: Self-pay | Source: Ambulatory Visit | Attending: Ophthalmology

## 2015-02-21 ENCOUNTER — Ambulatory Visit (HOSPITAL_COMMUNITY)
Admission: RE | Admit: 2015-02-21 | Discharge: 2015-02-21 | Disposition: A | Discharge status: Home / Self Care | Payer: Medicare Other | Source: Ambulatory Visit | Attending: Ophthalmology | Admitting: Ophthalmology

## 2015-02-21 ENCOUNTER — Encounter (HOSPITAL_COMMUNITY): Payer: Self-pay

## 2015-02-21 ENCOUNTER — Other Ambulatory Visit: Payer: Self-pay

## 2015-02-21 DIAGNOSIS — K219 Gastro-esophageal reflux disease without esophagitis: Secondary | ICD-10-CM | POA: Diagnosis not present

## 2015-02-21 DIAGNOSIS — Z791 Long term (current) use of non-steroidal anti-inflammatories (NSAID): Secondary | ICD-10-CM | POA: Insufficient documentation

## 2015-02-21 DIAGNOSIS — H25812 Combined forms of age-related cataract, left eye: Secondary | ICD-10-CM | POA: Insufficient documentation

## 2015-02-21 DIAGNOSIS — M1990 Unspecified osteoarthritis, unspecified site: Secondary | ICD-10-CM | POA: Insufficient documentation

## 2015-02-21 DIAGNOSIS — F418 Other specified anxiety disorders: Secondary | ICD-10-CM | POA: Insufficient documentation

## 2015-02-21 DIAGNOSIS — H269 Unspecified cataract: Secondary | ICD-10-CM | POA: Diagnosis not present

## 2015-02-21 DIAGNOSIS — Z79899 Other long term (current) drug therapy: Secondary | ICD-10-CM | POA: Insufficient documentation

## 2015-02-21 DIAGNOSIS — I10 Essential (primary) hypertension: Secondary | ICD-10-CM | POA: Insufficient documentation

## 2015-02-21 HISTORY — PX: CATARACT EXTRACTION W/PHACO: SHX586

## 2015-02-21 SURGERY — PHACOEMULSIFICATION, CATARACT, WITH IOL INSERTION
Anesthesia: Monitor Anesthesia Care | Site: Eye | Laterality: Left

## 2015-02-21 MED ORDER — CYCLOPENTOLATE-PHENYLEPHRINE 0.2-1 % OP SOLN
1.0000 [drp] | OPHTHALMIC | Status: AC
  Administered 2015-02-21 (×3): 1 [drp] via OPHTHALMIC

## 2015-02-21 MED ORDER — MIDAZOLAM HCL 2 MG/2ML IJ SOLN
1.0000 mg | INTRAMUSCULAR | Status: DC | PRN
  Administered 2015-02-21: 2 mg via INTRAVENOUS

## 2015-02-21 MED ORDER — FENTANYL CITRATE (PF) 100 MCG/2ML IJ SOLN
25.0000 ug | INTRAMUSCULAR | Status: AC
  Administered 2015-02-21 (×2): 25 ug via INTRAVENOUS

## 2015-02-21 MED ORDER — MUPIROCIN 2 % EX OINT
TOPICAL_OINTMENT | CUTANEOUS | Status: AC
  Filled 2015-02-21: qty 22

## 2015-02-21 MED ORDER — BSS IO SOLN
INTRAOCULAR | Status: DC | PRN
  Administered 2015-02-21: 15 mL

## 2015-02-21 MED ORDER — MUPIROCIN 2 % EX OINT
1.0000 "application " | TOPICAL_OINTMENT | Freq: Once | CUTANEOUS | Status: AC
  Administered 2015-02-21: 1 via TOPICAL

## 2015-02-21 MED ORDER — EPINEPHRINE HCL 1 MG/ML IJ SOLN
INTRAMUSCULAR | Status: AC
  Filled 2015-02-21: qty 1

## 2015-02-21 MED ORDER — PHENYLEPHRINE HCL 2.5 % OP SOLN
1.0000 [drp] | OPHTHALMIC | Status: AC
  Administered 2015-02-21 (×3): 1 [drp] via OPHTHALMIC

## 2015-02-21 MED ORDER — MIDAZOLAM HCL 2 MG/2ML IJ SOLN
INTRAMUSCULAR | Status: AC
  Filled 2015-02-21: qty 2

## 2015-02-21 MED ORDER — POVIDONE-IODINE 5 % OP SOLN
OPHTHALMIC | Status: DC | PRN
  Administered 2015-02-21: 1 via OPHTHALMIC

## 2015-02-21 MED ORDER — PROVISC 10 MG/ML IO SOLN
INTRAOCULAR | Status: DC | PRN
  Administered 2015-02-21: 0.85 mL via INTRAOCULAR

## 2015-02-21 MED ORDER — TETRACAINE HCL 0.5 % OP SOLN
1.0000 [drp] | OPHTHALMIC | Status: AC
  Administered 2015-02-21 (×3): 1 [drp] via OPHTHALMIC

## 2015-02-21 MED ORDER — LIDOCAINE HCL 3.5 % OP GEL
1.0000 | Freq: Once | OPHTHALMIC | Status: AC
Start: 2015-02-21 — End: 2015-02-21
  Administered 2015-02-21: 1 via OPHTHALMIC

## 2015-02-21 MED ORDER — LACTATED RINGERS IV SOLN
INTRAVENOUS | Status: DC
  Administered 2015-02-21: 12:00:00 via INTRAVENOUS

## 2015-02-21 MED ORDER — FENTANYL CITRATE (PF) 100 MCG/2ML IJ SOLN
INTRAMUSCULAR | Status: AC
Start: 2015-02-21 — End: 2015-02-21
  Filled 2015-02-21: qty 2

## 2015-02-21 MED ORDER — EPINEPHRINE HCL 1 MG/ML IJ SOLN
INTRAMUSCULAR | Status: DC | PRN
  Administered 2015-02-21: 500 mL

## 2015-02-21 MED ORDER — LIDOCAINE HCL (PF) 1 % IJ SOLN
INTRAMUSCULAR | Status: DC | PRN
  Administered 2015-02-21: .5 mL

## 2015-02-21 MED ORDER — NEOMYCIN-POLYMYXIN-DEXAMETH 3.5-10000-0.1 OP SUSP
OPHTHALMIC | Status: DC | PRN
  Administered 2015-02-21: 2 [drp] via OPHTHALMIC

## 2015-02-21 MED ORDER — LIDOCAINE 3.5 % OP GEL OPTIME - NO CHARGE
OPHTHALMIC | Status: DC | PRN
  Administered 2015-02-21: 1 [drp] via OPHTHALMIC

## 2015-02-21 SURGICAL SUPPLY — 13 items
CLOTH BEACON ORANGE TIMEOUT ST (SAFETY) ×1 IMPLANT
EYE SHIELD UNIVERSAL CLEAR (GAUZE/BANDAGES/DRESSINGS) ×1 IMPLANT
GLOVE BIOGEL PI IND STRL 6.5 (GLOVE) IMPLANT
GLOVE BIOGEL PI IND STRL 7.0 (GLOVE) IMPLANT
GLOVE BIOGEL PI INDICATOR 6.5 (GLOVE) ×1
GLOVE BIOGEL PI INDICATOR 7.0 (GLOVE) ×1
GLOVE EXAM NITRILE MD LF STRL (GLOVE) ×1 IMPLANT
PAD ARMBOARD 7.5X6 YLW CONV (MISCELLANEOUS) ×1 IMPLANT
SIGHTPATH CAT PROC W REG LENS (Ophthalmic Related) ×2 IMPLANT
SYRINGE LUER LOK 1CC (MISCELLANEOUS) ×1 IMPLANT
TAPE SURG TRANSPORE 1 IN (GAUZE/BANDAGES/DRESSINGS) IMPLANT
TAPE SURGICAL TRANSPORE 1 IN (GAUZE/BANDAGES/DRESSINGS) ×1
WATER STERILE IRR 250ML POUR (IV SOLUTION) ×1 IMPLANT

## 2015-02-21 NOTE — H&P (Signed)
I have reviewed the H&P, the patient was re-examined, and I have identified no interval changes in medical condition and plan of care since the history and physical of record  

## 2015-02-21 NOTE — Anesthesia Preprocedure Evaluation (Signed)
Anesthesia Evaluation  Patient identified by MRN, date of birth, ID band Patient awake    Reviewed: Allergy & Precautions, NPO status , Patient's Chart, lab work & pertinent test results, reviewed documented beta blocker date and time   Airway Mallampati: II  TM Distance: >3 FB     Dental  (+) Teeth Intact   Pulmonary shortness of breath and with exertion, pneumonia -, resolved,  breath sounds clear to auscultation        Cardiovascular hypertension, Pt. on medications and Pt. on home beta blockers Rhythm:Regular Rate:Normal     Neuro/Psych PSYCHIATRIC DISORDERS Anxiety Depression    GI/Hepatic GERD-  Medicated and Controlled,  Endo/Other    Renal/GU      Musculoskeletal  (+) Arthritis -,   Abdominal   Peds  Hematology   Anesthesia Other Findings   Reproductive/Obstetrics                             Anesthesia Physical Anesthesia Plan  ASA: III  Anesthesia Plan: MAC   Post-op Pain Management:    Induction: Intravenous  Airway Management Planned: Nasal Cannula  Additional Equipment:   Intra-op Plan:   Post-operative Plan:   Informed Consent: I have reviewed the patients History and Physical, chart, labs and discussed the procedure including the risks, benefits and alternatives for the proposed anesthesia with the patient or authorized representative who has indicated his/her understanding and acceptance.     Plan Discussed with:   Anesthesia Plan Comments:         Anesthesia Quick Evaluation

## 2015-02-21 NOTE — Discharge Instructions (Signed)

## 2015-02-21 NOTE — Transfer of Care (Signed)
Immediate Anesthesia Transfer of Care Note  Patient: Audrey Johnson  Procedure(s) Performed: Procedure(s): CATARACT EXTRACTION PHACO AND INTRAOCULAR LENS PLACEMENT LEFT EYE CDE=5.86 (Left)  Patient Location: Short Stay  Anesthesia Type:MAC  Level of Consciousness: awake, alert , oriented and patient cooperative  Airway & Oxygen Therapy: Patient Spontanous Breathing  Post-op Assessment: Report given to RN, Post -op Vital signs reviewed and stable and Patient moving all extremities  Post vital signs: Reviewed and stable  Last Vitals:  Filed Vitals:   02/21/15 1230  BP: 144/62  Pulse:   Temp:   Resp: 17    Complications: No apparent anesthesia complications

## 2015-02-21 NOTE — Anesthesia Postprocedure Evaluation (Signed)
  Anesthesia Post-op Note  Patient: Audrey Johnson  Procedure(s) Performed: Procedure(s): CATARACT EXTRACTION PHACO AND INTRAOCULAR LENS PLACEMENT LEFT EYE CDE=5.86 (Left)  Patient Location: Short Stay  Anesthesia Type:MAC  Level of Consciousness: awake, alert , oriented and patient cooperative  Airway and Oxygen Therapy: Patient Spontanous Breathing  Post-op Pain: none  Post-op Assessment: Post-op Vital signs reviewed, Patient's Cardiovascular Status Stable, Respiratory Function Stable, Patent Airway and Pain level controlled              Post-op Vital Signs: Reviewed and stable  Last Vitals:  Filed Vitals:   02/21/15 1230  BP: 144/62  Pulse:   Temp:   Resp: 17    Complications: No apparent anesthesia complications

## 2015-02-21 NOTE — Op Note (Signed)
Date of Admission: 02/21/2015  Date of Surgery: 02/21/2015   Pre-Op Dx: Cataract Left Eye  Post-Op Dx: Senile Combined Cataract Left  Eye,  Dx Code Q22.297  Surgeon: Tonny Branch, M.D.  Assistants: None  Anesthesia: Topical with MAC  Indications: Painless, progressive loss of vision with compromise of daily activities.  Surgery: Cataract Extraction with Intraocular lens Implant Left Eye  Discription: The patient had dilating drops and viscous lidocaine placed into the Left eye in the pre-op holding area. After transfer to the operating room, a time out was performed. The patient was then prepped and draped. Beginning with a 34 degree blade a paracentesis port was made at the surgeon's 2 o'clock position. The anterior chamber was then filled with 1% non-preserved lidocaine. This was followed by filling the anterior chamber with Provisc.  A 2.21mm keratome blade was used to make a clear corneal incision at the temporal limbus.  A bent cystatome needle was used to create a continuous tear capsulotomy. Hydrodissection was performed with balanced salt solution on a Fine canula. The lens nucleus was then removed using the phacoemulsification handpiece. Residual cortex was removed with the I&A handpiece. The anterior chamber and capsular bag were refilled with Provisc. A posterior chamber intraocular lens was placed into the capsular bag with it's injector. The implant was positioned with the Kuglan hook. The Provisc was then removed from the anterior chamber and capsular bag with the I&A handpiece. Stromal hydration of the main incision and paracentesis port was performed with BSS on a Fine canula. The wounds were tested for leak which was negative. The patient tolerated the procedure well. There were no operative complications. The patient was then transferred to the recovery room in stable condition.  Complications: None  Specimen: None  EBL: None  Prosthetic device: Hoya iSert 250, power 18.0 D, SN  [id number].

## 2015-02-22 ENCOUNTER — Encounter (HOSPITAL_COMMUNITY): Payer: Self-pay | Admitting: Ophthalmology

## 2015-02-25 ENCOUNTER — Other Ambulatory Visit: Payer: Self-pay

## 2015-02-28 DIAGNOSIS — H25811 Combined forms of age-related cataract, right eye: Secondary | ICD-10-CM | POA: Diagnosis not present

## 2015-02-28 MED ORDER — OMEPRAZOLE 20 MG PO CPDR: DELAYED_RELEASE_CAPSULE | ORAL | Status: DC

## 2015-03-04 ENCOUNTER — Ambulatory Visit (INDEPENDENT_AMBULATORY_CARE_PROVIDER_SITE_OTHER): Payer: Medicare Other | Admitting: Psychiatry

## 2015-03-04 ENCOUNTER — Telehealth (HOSPITAL_COMMUNITY): Payer: Self-pay | Admitting: *Deleted

## 2015-03-04 ENCOUNTER — Encounter (HOSPITAL_COMMUNITY): Payer: Self-pay | Admitting: Psychiatry

## 2015-03-04 VITALS — BP 130/74 | HR 76 | Ht 64.0 in | Wt 175.2 lb

## 2015-03-04 DIAGNOSIS — F329 Major depressive disorder, single episode, unspecified: Secondary | ICD-10-CM | POA: Diagnosis not present

## 2015-03-04 DIAGNOSIS — F32A Depression, unspecified: Secondary | ICD-10-CM

## 2015-03-04 MED ORDER — MIRTAZAPINE 7.5 MG PO TABS: 7.5000 mg | ORAL_TABLET | Freq: Every day | ORAL | Status: DC

## 2015-03-04 MED ORDER — ALPRAZOLAM 0.25 MG PO TABS: 0.2500 mg | ORAL_TABLET | Freq: Two times a day (BID) | ORAL | Status: DC

## 2015-03-04 MED ORDER — ESCITALOPRAM OXALATE 10 MG PO TABS: 10.0000 mg | ORAL_TABLET | Freq: Every day | ORAL | Status: DC

## 2015-03-04 NOTE — Telephone Encounter (Signed)
Patient refused to sign Appointment Responsibility

## 2015-03-04 NOTE — Patient Instructions (Signed)
Gabapentin-- decrease to twice a day for one week, then once a day for one week, then stop

## 2015-03-04 NOTE — Progress Notes (Signed)
Patient ID: Audrey Johnson, female   DOB: 05/28/1941, 74 y.o.   MRN: [id number] Patient ID: Audrey Johnson, female   DOB: [birth year], 74 y.o.   MRN: [id number] Patient ID: Audrey Johnson, female   DOB: February 23, 1941, 74 y.o.   MRN: [id number] Patient ID: Audrey Johnson, female   DOB: Dec 05, 1940, 74 y.o.   MRN: [id number] Patient ID: Audrey Johnson, female   DOB: [birth year], 74 y.o.   MRN: [id number] Patient ID: Audrey Johnson, female   DOB: Jun 11, 1941, 74 y.o.   MRN: [id number] Patient ID: Audrey Johnson, female   DOB: 15-Aug-1940, 74 y.o.   MRN: [id number] Patient ID: Audrey Johnson, female   DOB: 1940-08-16, 74 y.o.   MRN: [id number] Patient ID: Audrey Johnson, female   DOB: 03-30-1941, 74 y.o.   MRN: [id number] Patient ID: Audrey Johnson, female   DOB: 02/03/1941, 74 y.o.   MRN: [id number]  Psychiatric Assessment Adult  Patient Identification:  Audrey Johnson Date of Evaluation:  03/04/2015 Chief Complaint: I feel dizzy during the day History of Chief Complaint:   Chief Complaint  Patient presents with  . Depression  . Anxiety  . Follow-up    Depression        Associated symptoms include appetite change.  Past medical history includes anxiety.   Anxiety    Altered Mental Status Associated symptoms include joint swelling.   this patient is a 74 year old widowed black female who lives with her son in [place]. She's accompanied by her sister Audrey Johnson and her son-in-law Audrey Johnson. I also spoke on the phone to her daughter Audrey Johnson. The patient used to work in a [employer] but has been retired for several years.  The patient was referred by Dr. Nevada Crane, her primary Dr. Apparently she's not had any history of mental health symptoms until about 2 months ago. She started to have severe stomach pain and has gone to several emergency room this in the local area. She's had 9 emergency room visits in the last couple of months. Her last one was yesterday. She's had 3  abdominal CTs in the last month, and ultrasound and blood work. Nothing significantly wrong has been found.  The patient is on numerous medications for her stomach but they don't seem to help. She was prescribed Lexapro by her primary doctor but she refused to take it.  she is taking a low dose of Xanax  She returns after 3 months . For the most part she is doing ok. She has been helping a friend with his garden and she enjoys this. She recently had cataract surgery with a good result. However she feels dizzy sometimes although her blood pressure is normal. I thought perhaps we should start tapering off her Neurontin since this can cause dizziness and drowsiness. She denies being depressed but is occasionally anxious. She is sleeping well. She doesn't have much appetite but she doesn't feel sad or worried like she has in the past Review of Systems  Constitutional: Positive for appetite change and unexpected weight change.  Musculoskeletal: Positive for back pain and joint swelling.  Psychiatric/Behavioral: Positive for depression.   Physical Exam not done  Depressive Symptoms: depressed mood, anhedonia, insomnia, psychomotor agitation, difficulty concentrating, hopelessness, suicidal thoughts with specific plan, anxiety, loss of energy/fatigue, weight loss,  (Hypo) Manic Symptoms:   Elevated Mood:  No Irritable Mood:  Yes Grandiosity:  No Distractibility:  No Labiality of Mood:  Yes Delusions:  Yes Hallucinations:  Yes Impulsivity:  No Sexually Inappropriate Behavior:  No Financial Extravagance:  No Flight of Ideas:  No  Anxiety Symptoms: Excessive Worry:  Yes Panic Symptoms:  No Agoraphobia:  No Obsessive Compulsive: No  Symptoms: None, Specific Phobias:  No Social Anxiety:  No  Psychotic Symptoms:  Hallucinations: Yes Visual Delusions:  Yes Paranoia:  No   Ideas of Reference:  No  PTSD Symptoms: Ever had a traumatic exposure:  No Had a traumatic exposure in the  last month:  No Re-experiencing: No None Hypervigilance:  No Hyperarousal: No None Avoidance: No None  Traumatic Brain Injury: No   Past Psychiatric History: Diagnosis: None   Hospitalizations: None   Outpatient Care: None   Substance Abuse Care: None   Self-Mutilation: None   Suicidal Attempts: None   Violent Behaviors: None    Past Medical History:   Past Medical History  Diagnosis Date  . Hypertension   . High cholesterol   . Chronic back pain   . Anxiety   . Shortness of breath      even walking causes sob...   . Arthritis     in back .   Marland Kitchen Pneumonia   . Borderline diabetes   . Chronic abdominal pain   . Chronic constipation   . Chronic nausea   . Noncompliance with medication regimen   . GERD (gastroesophageal reflux disease)   . Poor historian    History of Loss of Consciousness:  No Seizure History:  No Cardiac History:  No Allergies:  No Known Allergies Current Medications:  Current Outpatient Prescriptions  Medication Sig Dispense Refill  . ALPRAZolam (XANAX) 0.25 MG tablet Take 1 tablet (0.25 mg total) by mouth 2 (two) times daily. 60 tablet 2  . escitalopram (LEXAPRO) 10 MG tablet Take 1 tablet (10 mg total) by mouth daily. 90 tablet 2  . mirtazapine (REMERON) 7.5 MG tablet Take 1 tablet (7.5 mg total) by mouth at bedtime. 90 tablet 2  . naproxen (NAPROSYN) 500 MG tablet Take 1 tablet (500 mg total) by mouth 2 (two) times daily. 20 tablet 0  . nebivolol (BYSTOLIC) 5 MG tablet Take 5 mg by mouth daily.    Marland Kitchen omeprazole (PRILOSEC) 20 MG capsule TAKE 2 CAPSULES BY MOUTH TWICE A DAY BEFORE MEALS 180 capsule 3  . PROAIR HFA 108 (90 BASE) MCG/ACT inhaler Inhale 1-2 puffs into the lungs every 4 (four) hours as needed for wheezing or shortness of breath.     . traMADol (ULTRAM) 50 MG tablet Take 50 mg by mouth every 6 (six) hours as needed for moderate pain.      No current facility-administered medications for this visit.    Previous Psychotropic  Medications:  Medication Dose   Xanax   0.25 mg every 8 hours as needed                      Substance Abuse History in the last 12 months: Substance Age of 1st Use Last Use Amount Specific Type  Nicotine      Alcohol      Cannabis      Opiates      Cocaine      Methamphetamines      LSD      Ecstasy      Benzodiazepines      Caffeine      Inhalants      Others:  Medical Consequences of Substance Abuse: n/a  Legal Consequences of Substance Abuse: n/a  Family Consequences of Substance Abuse: n/a  Blackouts:  No DT's:  No Withdrawal Symptoms:  No None  Social History: Current Place of Residence: [place] of Birth: Unknown Family Members: Lives with son Marital Status:  Widowed Children:   Sons: 1  Daughters: 1 Relationships:  Education:  [employer] Problems/Performance:  Religious Beliefs/Practices: Unknown History of Abuse: none Pensions consultant; Manufacturing engineer History:  None. Legal History: None Hobbies/Interests: Church  Family History:   Family History  Problem Relation Age of Onset  . 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 depression, single episode with psychotic features and somatization disorder  AXIS I Major Depression,  single episode with psychotic features, and somatization disorder   AXIS II Deferred  AXIS III Past Medical History  Diagnosis Date  . Hypertension   . High cholesterol   . Chronic back pain   . Anxiety   . Shortness of breath      even walking causes sob...   . Arthritis     in back .   Marland Kitchen Pneumonia   . Borderline diabetes   . Chronic abdominal pain   . Chronic constipation   . Chronic nausea   . Noncompliance with medication regimen   . GERD (gastroesophageal reflux disease)   . Poor historian      AXIS IV other psychosocial or environmental problems  AXIS V 11-20 some danger of hurting self or others possible OR occasionally fails to maintain minimal personal hygiene OR gross impairment in communication   Treatment Plan/Recommendations:  Plan of Care: Medication management   Laboratory:    Psychotherapy: She is scheduled to see Maurice Small   Medications: The patient will continue mirtazapine 7.5 mg each bedtime as this helped her sleep. She'll continue Lexapro 10 mg every morning for depression  and taper off Neurontin 100 mg 3 times a day for anxiety over the next 2 weeks. She will continue Xanax 0.25 mg 2 times a day also for anxiety   Routine PRN Medications:  No  Consultations:   Safety Concerns:  She denies any thoughts or plans to hurt self or others   Other:  She'll return in 6 weeks     [name], MD 9/2/201611:32 AM

## 2015-03-04 NOTE — Telephone Encounter (Signed)
noted 

## 2015-03-09 ENCOUNTER — Other Ambulatory Visit (HOSPITAL_COMMUNITY): Payer: Self-pay | Admitting: Internal Medicine

## 2015-03-09 DIAGNOSIS — Z1231 Encounter for screening mammogram for malignant neoplasm of breast: Secondary | ICD-10-CM

## 2015-03-10 ENCOUNTER — Encounter (HOSPITAL_COMMUNITY): Payer: Self-pay

## 2015-03-10 ENCOUNTER — Encounter (HOSPITAL_COMMUNITY)
Admission: RE | Admit: 2015-03-10 | Discharge: 2015-03-10 | Disposition: A | Discharge status: Home / Self Care | Payer: Medicare Other | Source: Ambulatory Visit | Attending: Ophthalmology | Admitting: Ophthalmology

## 2015-03-11 MED ORDER — LIDOCAINE HCL (PF) 1 % IJ SOLN
INTRAMUSCULAR | Status: AC
  Filled 2015-03-11: qty 2

## 2015-03-11 MED ORDER — NEOMYCIN-POLYMYXIN-DEXAMETH 3.5-10000-0.1 OP SUSP
OPHTHALMIC | Status: AC
  Filled 2015-03-11: qty 5

## 2015-03-11 MED ORDER — TETRACAINE HCL 0.5 % OP SOLN
OPHTHALMIC | Status: AC
  Filled 2015-03-11: qty 2

## 2015-03-11 MED ORDER — PHENYLEPHRINE HCL 2.5 % OP SOLN
OPHTHALMIC | Status: AC
  Filled 2015-03-11: qty 15

## 2015-03-11 MED ORDER — LIDOCAINE HCL 3.5 % OP GEL
OPHTHALMIC | Status: AC
  Filled 2015-03-11: qty 1

## 2015-03-11 MED ORDER — CYCLOPENTOLATE-PHENYLEPHRINE OP SOLN OPTIME - NO CHARGE
OPHTHALMIC | Status: AC
  Filled 2015-03-11: qty 2

## 2015-03-14 ENCOUNTER — Encounter (HOSPITAL_COMMUNITY): Payer: Self-pay | Admitting: *Deleted

## 2015-03-14 ENCOUNTER — Ambulatory Visit (HOSPITAL_COMMUNITY): Payer: Medicare Other | Admitting: Anesthesiology

## 2015-03-14 ENCOUNTER — Encounter (HOSPITAL_COMMUNITY)
Admission: RE | Disposition: A | Discharge status: Home / Self Care | Payer: Self-pay | Source: Ambulatory Visit | Attending: Ophthalmology

## 2015-03-14 ENCOUNTER — Ambulatory Visit (HOSPITAL_COMMUNITY)
Admission: RE | Admit: 2015-03-14 | Discharge: 2015-03-14 | Disposition: A | Discharge status: Home / Self Care | Payer: Medicare Other | Source: Ambulatory Visit | Attending: Ophthalmology | Admitting: Ophthalmology

## 2015-03-14 DIAGNOSIS — I10 Essential (primary) hypertension: Secondary | ICD-10-CM | POA: Diagnosis not present

## 2015-03-14 DIAGNOSIS — F329 Major depressive disorder, single episode, unspecified: Secondary | ICD-10-CM | POA: Diagnosis not present

## 2015-03-14 DIAGNOSIS — R0602 Shortness of breath: Secondary | ICD-10-CM | POA: Insufficient documentation

## 2015-03-14 DIAGNOSIS — K219 Gastro-esophageal reflux disease without esophagitis: Secondary | ICD-10-CM | POA: Diagnosis not present

## 2015-03-14 DIAGNOSIS — H25811 Combined forms of age-related cataract, right eye: Secondary | ICD-10-CM | POA: Insufficient documentation

## 2015-03-14 DIAGNOSIS — M1990 Unspecified osteoarthritis, unspecified site: Secondary | ICD-10-CM | POA: Insufficient documentation

## 2015-03-14 DIAGNOSIS — H25812 Combined forms of age-related cataract, left eye: Secondary | ICD-10-CM | POA: Diagnosis not present

## 2015-03-14 DIAGNOSIS — F419 Anxiety disorder, unspecified: Secondary | ICD-10-CM | POA: Diagnosis not present

## 2015-03-14 DIAGNOSIS — H269 Unspecified cataract: Secondary | ICD-10-CM | POA: Diagnosis not present

## 2015-03-14 HISTORY — PX: CATARACT EXTRACTION W/PHACO: SHX586

## 2015-03-14 LAB — GLUCOSE, CAPILLARY: Glucose-Capillary: 80 mg/dL (ref 65–99)

## 2015-03-14 SURGERY — PHACOEMULSIFICATION, CATARACT, WITH IOL INSERTION
Anesthesia: Monitor Anesthesia Care | Site: Eye | Laterality: Right

## 2015-03-14 MED ORDER — POVIDONE-IODINE 5 % OP SOLN
OPHTHALMIC | Status: DC | PRN
  Administered 2015-03-14: 1 via OPHTHALMIC

## 2015-03-14 MED ORDER — PHENYLEPHRINE HCL 2.5 % OP SOLN
1.0000 [drp] | OPHTHALMIC | Status: AC
  Administered 2015-03-14 (×3): 1 [drp] via OPHTHALMIC

## 2015-03-14 MED ORDER — MIDAZOLAM HCL 2 MG/2ML IJ SOLN
1.0000 mg | INTRAMUSCULAR | Status: DC | PRN
  Administered 2015-03-14 (×2): 2 mg via INTRAVENOUS
  Filled 2015-03-14 (×2): qty 2

## 2015-03-14 MED ORDER — FENTANYL CITRATE (PF) 100 MCG/2ML IJ SOLN
25.0000 ug | INTRAMUSCULAR | Status: AC
  Administered 2015-03-14 (×2): 25 ug via INTRAVENOUS
  Filled 2015-03-14: qty 2

## 2015-03-14 MED ORDER — NEOMYCIN-POLYMYXIN-DEXAMETH 3.5-10000-0.1 OP SUSP
OPHTHALMIC | Status: DC | PRN
  Administered 2015-03-14: 1 [drp] via OPHTHALMIC

## 2015-03-14 MED ORDER — TETRACAINE HCL 0.5 % OP SOLN
1.0000 [drp] | OPHTHALMIC | Status: AC
  Administered 2015-03-14 (×3): 1 [drp] via OPHTHALMIC

## 2015-03-14 MED ORDER — EPINEPHRINE HCL 1 MG/ML IJ SOLN
INTRAMUSCULAR | Status: AC
  Filled 2015-03-14: qty 1

## 2015-03-14 MED ORDER — LIDOCAINE HCL 3.5 % OP GEL: 1.0000 "application " | Freq: Once | OPHTHALMIC | Status: DC

## 2015-03-14 MED ORDER — LIDOCAINE HCL (PF) 1 % IJ SOLN
INTRAMUSCULAR | Status: DC | PRN
  Administered 2015-03-14: .4 mL

## 2015-03-14 MED ORDER — PROVISC 10 MG/ML IO SOLN
INTRAOCULAR | Status: DC | PRN
  Administered 2015-03-14: 0.85 mL via INTRAOCULAR

## 2015-03-14 MED ORDER — EPINEPHRINE HCL 1 MG/ML IJ SOLN
INTRAMUSCULAR | Status: DC | PRN
  Administered 2015-03-14: 500 mL

## 2015-03-14 MED ORDER — BSS IO SOLN
INTRAOCULAR | Status: DC | PRN
  Administered 2015-03-14: 15 mL

## 2015-03-14 MED ORDER — LIDOCAINE 3.5 % OP GEL OPTIME - NO CHARGE
OPHTHALMIC | Status: DC | PRN
  Administered 2015-03-14: 1 [drp] via OPHTHALMIC

## 2015-03-14 MED ORDER — LACTATED RINGERS IV SOLN
INTRAVENOUS | Status: DC
  Administered 2015-03-14: 12:00:00 via INTRAVENOUS

## 2015-03-14 MED ORDER — CYCLOPENTOLATE-PHENYLEPHRINE 0.2-1 % OP SOLN
1.0000 [drp] | OPHTHALMIC | Status: AC
  Administered 2015-03-14 (×3): 1 [drp] via OPHTHALMIC

## 2015-03-14 SURGICAL SUPPLY — 12 items
CLOTH BEACON ORANGE TIMEOUT ST (SAFETY) ×1 IMPLANT
EYE SHIELD UNIVERSAL CLEAR (GAUZE/BANDAGES/DRESSINGS) ×1 IMPLANT
GLOVE BIOGEL PI IND STRL 6.5 (GLOVE) IMPLANT
GLOVE BIOGEL PI INDICATOR 6.5 (GLOVE) ×2
GLOVE ECLIPSE 8.0 STRL XLNG CF (GLOVE) ×1 IMPLANT
GOWN STRL REUS W/TWL LRG LVL3 (GOWN DISPOSABLE) ×1 IMPLANT
PAD ARMBOARD 7.5X6 YLW CONV (MISCELLANEOUS) ×1 IMPLANT
SIGHTPATH CAT PROC W REG LENS (Ophthalmic Related) ×2 IMPLANT
SYRINGE LUER LOK 1CC (MISCELLANEOUS) ×1 IMPLANT
TAPE SURG TRANSPORE 1 IN (GAUZE/BANDAGES/DRESSINGS) IMPLANT
TAPE SURGICAL TRANSPORE 1 IN (GAUZE/BANDAGES/DRESSINGS) ×1
WATER STERILE IRR 250ML POUR (IV SOLUTION) ×1 IMPLANT

## 2015-03-14 NOTE — Progress Notes (Signed)
Eyeglasses returned to pt.

## 2015-03-14 NOTE — H&P (Signed)
I have reviewed the H&P, the patient was re-examined, and I have identified no interval changes in medical condition and plan of care since the history and physical of record  

## 2015-03-14 NOTE — Anesthesia Preprocedure Evaluation (Signed)
Anesthesia Evaluation  Patient identified by MRN, date of birth, ID band Patient awake    Reviewed: Allergy & Precautions, NPO status , Patient's Chart, lab work & pertinent test results, reviewed documented beta blocker date and time   Airway Mallampati: II  TM Distance: >3 FB     Dental  (+) Teeth Intact   Pulmonary shortness of breath and with exertion, pneumonia -, resolved,  breath sounds clear to auscultation        Cardiovascular hypertension, Pt. on medications and Pt. on home beta blockers Rhythm:Regular Rate:Normal     Neuro/Psych PSYCHIATRIC DISORDERS Anxiety Depression    GI/Hepatic GERD-  Medicated and Controlled,  Endo/Other    Renal/GU      Musculoskeletal  (+) Arthritis -,   Abdominal   Peds  Hematology   Anesthesia Other Findings   Reproductive/Obstetrics                             Anesthesia Physical Anesthesia Plan  ASA: III  Anesthesia Plan: MAC   Post-op Pain Management:    Induction: Intravenous  Airway Management Planned: Nasal Cannula  Additional Equipment:   Intra-op Plan:   Post-operative Plan:   Informed Consent: I have reviewed the patients History and Physical, chart, labs and discussed the procedure including the risks, benefits and alternatives for the proposed anesthesia with the patient or authorized representative who has indicated his/her understanding and acceptance.     Plan Discussed with:   Anesthesia Plan Comments:         Anesthesia Quick Evaluation  

## 2015-03-14 NOTE — Anesthesia Postprocedure Evaluation (Signed)
  Anesthesia Post-op Note  Patient: Audrey Johnson  Procedure(s) Performed: Procedure(s) with comments: CATARACT EXTRACTION PHACO AND INTRAOCULAR LENS PLACEMENT (IOC) (Right) - CDE:6.36  Patient Location: PACU  Anesthesia Type:MAC  Level of Consciousness: awake, alert , oriented, patient cooperative and responds to stimulation  Airway and Oxygen Therapy: Patient Spontanous Breathing  Post-op Pain: none  Post-op Assessment: Post-op Vital signs reviewed, Patient's Cardiovascular Status Stable, Respiratory Function Stable, Patent Airway, No signs of Nausea or vomiting and Pain level controlled              Post-op Vital Signs: Reviewed and stable  Last Vitals:  Filed Vitals:   03/14/15 1249  BP: 176/92  Pulse:   Temp:   Resp: 13    Complications: No apparent anesthesia complications

## 2015-03-14 NOTE — Discharge Instructions (Signed)

## 2015-03-14 NOTE — Op Note (Signed)
Date of Admission: 03/14/2015  Date of Surgery: 03/14/2015   Pre-Op Dx: Cataract Right Eye  Post-Op Dx: Senile Combined Cataract Right  Eye,  Dx Code B34.193  Surgeon: Tonny Branch, M.D.  Assistants: None  Anesthesia: Topical with MAC  Indications: Painless, progressive loss of vision with compromise of daily activities.  Surgery: Cataract Extraction with Intraocular lens Implant Right Eye  Discription: The patient had dilating drops and viscous lidocaine placed into the Right eye in the pre-op holding area. After transfer to the operating room, a time out was performed. The patient was then prepped and draped. Beginning with a 36 degree blade a paracentesis port was made at the surgeon's 2 o'clock position. The anterior chamber was then filled with 1% non-preserved lidocaine. This was followed by filling the anterior chamber with Provisc.  A 2.12mm keratome blade was used to make a clear corneal incision at the temporal limbus.  A bent cystatome needle was used to create a continuous tear capsulotomy. Hydrodissection was performed with balanced salt solution on a Fine canula. The lens nucleus was then removed using the phacoemulsification handpiece. Residual cortex was removed with the I&A handpiece. The anterior chamber and capsular bag were refilled with Provisc. A posterior chamber intraocular lens was placed into the capsular bag with it's injector. The implant was positioned with the Kuglan hook. The Provisc was then removed from the anterior chamber and capsular bag with the I&A handpiece. Stromal hydration of the main incision and paracentesis port was performed with BSS on a Fine canula. The wounds were tested for leak which was negative. The patient tolerated the procedure well. There were no operative complications. The patient was then transferred to the recovery room in stable condition.  Complications: None  Specimen: None  EBL: None  Prosthetic device: Hoya iSert 250, power 18.0  D, SN [id number].

## 2015-03-14 NOTE — Transfer of Care (Signed)
Immediate Anesthesia Transfer of Care Note  Patient: Audrey Johnson  Procedure(s) Performed: Procedure(s) with comments: CATARACT EXTRACTION PHACO AND INTRAOCULAR LENS PLACEMENT (IOC) (Right) - CDE:6.36  Patient Location: PACU  Anesthesia Type:MAC  Level of Consciousness: awake, alert , oriented, patient cooperative and responds to stimulation  Airway & Oxygen Therapy: Patient Spontanous Breathing  Post-op Assessment: Report given to RN, Post -op Vital signs reviewed and stable, Patient moving all extremities and Patient moving all extremities X 4  Post vital signs: Reviewed and stable  Last Vitals:  Filed Vitals:   03/14/15 1249  BP: 176/92  Pulse:   Temp:   Resp: 13    Complications: No apparent anesthesia complications

## 2015-03-14 NOTE — Anesthesia Procedure Notes (Signed)
Procedure Name: MAC Date/Time: 03/14/2015 12:55 PM Performed by: Michele Rockers Pre-anesthesia Checklist: Patient identified, Emergency Drugs available, Suction available, Timeout performed and Patient being monitored Patient Re-evaluated:Patient Re-evaluated prior to inductionOxygen Delivery Method: Nasal Cannula

## 2015-03-14 NOTE — Progress Notes (Signed)
Pt left glasses in day surgery. Ms. Zadie Rhine, pt's sister notified. Will be in to pick up glasses. Eyeglasses at nurses desk in day surgery.

## 2015-03-15 ENCOUNTER — Encounter (HOSPITAL_COMMUNITY): Payer: Self-pay | Admitting: Ophthalmology

## 2015-03-22 ENCOUNTER — Encounter: Payer: Self-pay | Admitting: Internal Medicine

## 2015-03-31 ENCOUNTER — Ambulatory Visit (INDEPENDENT_AMBULATORY_CARE_PROVIDER_SITE_OTHER): Payer: Medicare Other | Admitting: Nurse Practitioner

## 2015-03-31 ENCOUNTER — Encounter: Payer: Self-pay | Admitting: Nurse Practitioner

## 2015-03-31 ENCOUNTER — Ambulatory Visit: Payer: Medicare Other | Admitting: Nurse Practitioner

## 2015-03-31 VITALS — BP 157/84 | HR 73 | Temp 97.5°F | Ht 64.0 in | Wt 175.6 lb

## 2015-03-31 DIAGNOSIS — G8929 Other chronic pain: Secondary | ICD-10-CM

## 2015-03-31 DIAGNOSIS — R109 Unspecified abdominal pain: Secondary | ICD-10-CM

## 2015-03-31 DIAGNOSIS — K219 Gastro-esophageal reflux disease without esophagitis: Secondary | ICD-10-CM | POA: Diagnosis not present

## 2015-03-31 DIAGNOSIS — K5900 Constipation, unspecified: Secondary | ICD-10-CM

## 2015-03-31 NOTE — Patient Instructions (Signed)
1. We will call you with the closest locations for pain management Center's and allow you to pick which one or declined ago. 2. Keep taking her current medications as you have been. 3. Return for follow-up in one year. 4. Call us if you have any problems or worsening symptoms and we can see you sooner.

## 2015-03-31 NOTE — Progress Notes (Signed)
Referring Provider: Celene Squibb, MD Primary Care Physician:  Wende Neighbors, MD Primary GI:  Dr. Gala Romney  Chief Complaint  Patient presents with  . Follow-up    still has some L side abd pain    HPI:   74 year old female presents for follow-up on GERD, constipation, and chronic left-sided abdominal pain. She has had extensive evaluation including CT negative for mesenteric ischemia in June 2014 (Dr. Thornton Papas did not recommend CTA), EGD and colonoscopy (hyperplastic polyp, negative for H. pylori, erosive reflux esophagitis), CT (multiple), MRI of the spine (severe multilevel lumbar spondylosis, stenosis at L4-L5 unchanged from 2013). Last CT abdomen pelvis was in October 2014. She had minimal right-sided hydronephrosis of uncertain significance, similar to prior study. Last seen in our office 04/07/2014 which point she states she was feeling 100% better. Her constipation was improved and she stopped taking her Linzess. Heartburn well controlled and good appetite. Continued intermittent pain.  Today she states she's doing pretty well. Continues with intermittent left side abdominal pain described as sharp. Worse at some times than others. Worse when wearing tight clothes. Pain is chronic since she was ill last year. Pain is in the left side, mid to low. Sometimes hurts worse when laying on her side. When it gets worse, it will last for a minute or two. Occasional/rare nausea. Denies vomiting. Pain sometimes radiates to LLQ or anterior lower abdomen. Denies hematochezia and melena. Denies fever, chills. Admits subjective unintentional weight loss over the past 3-4 months. Her apetite has decreased.  Objectively her weight 07/20/13 was 178 lb and today it is 175 lb. Denies chest pain, dyspnea, dizziness, lightheadedness, syncope, near syncope. Denies any other upper or lower GI symptoms.   Past Medical History  Diagnosis Date  . Hypertension   . High cholesterol   . Chronic back pain   . Anxiety   .  Shortness of breath      even walking causes sob...   . Arthritis     in back .   Marland Kitchen Pneumonia   . Borderline diabetes   . Chronic abdominal pain   . Chronic constipation   . Chronic nausea   . Noncompliance with medication regimen   . GERD (gastroesophageal reflux disease)   . Poor historian     Past Surgical History  Procedure Laterality Date  . Abdominal hysterectomy    . Toe surgery Right     5th toe, not sure what was wrong with it  . Dilation and curettage of uterus    . Cardiac catheterization    . Colonoscopy  04/16/2007    RMR: Normal rectum and colon  . Colonoscopy with esophagogastroduodenoscopy (egd) N/A 01/08/2013    XBM:WUXL erosive reflux esophagitis. Negative H.pylori. Small hiatal hernia-s/p bx (gastritis)-TCS/Colonic polyp-hyperplastic  . Left heart catheterization with coronary angiogram N/A 06/05/2012    Procedure: LEFT HEART CATHETERIZATION WITH CORONARY ANGIOGRAM;  Surgeon: Hillary Bow, MD;  Location: [facility] CATH LAB;  Service: Cardiovascular;  Laterality: N/A;  . Breast surgery  unknown (many yrs)    excision  of lump right breast  was per pt "nothing"   . Cataract extraction w/phaco Left 02/21/2015    Procedure: CATARACT EXTRACTION PHACO AND INTRAOCULAR LENS PLACEMENT LEFT EYE CDE=5.86;  Surgeon: Tonny Branch, MD;  Location: [facility];  Service: Ophthalmology;  Laterality: Left;  . Cataract extraction w/phaco Right 03/14/2015    Procedure: CATARACT EXTRACTION PHACO AND INTRAOCULAR LENS PLACEMENT (IOC);  Surgeon: Tonny Branch, MD;  Location: [facility];  Service: Ophthalmology;  Laterality: Right;  CDE:6.36    Current Outpatient Prescriptions  Medication Sig Dispense Refill  . ALPRAZolam (XANAX) 0.25 MG tablet Take 1 tablet (0.25 mg total) by mouth 2 (two) times daily. 60 tablet 2  . escitalopram (LEXAPRO) 10 MG tablet Take 1 tablet (10 mg total) by mouth daily. 90 tablet 2  . gabapentin (NEURONTIN) 100 MG capsule Take 100 mg by mouth 3 (three) times daily.    .  mirtazapine (REMERON) 7.5 MG tablet Take 1 tablet (7.5 mg total) by mouth at bedtime. 90 tablet 2  . nebivolol (BYSTOLIC) 5 MG tablet Take 5 mg by mouth daily.    Marland Kitchen omeprazole (PRILOSEC) 20 MG capsule TAKE 2 CAPSULES BY MOUTH TWICE A DAY BEFORE MEALS 180 capsule 3  . PROAIR HFA 108 (90 BASE) MCG/ACT inhaler Inhale 1-2 puffs into the lungs every 4 (four) hours as needed for wheezing or shortness of breath.     . naproxen (NAPROSYN) 500 MG tablet Take 1 tablet (500 mg total) by mouth 2 (two) times daily. (Patient not taking: Reported on 03/31/2015) 20 tablet 0  . traMADol (ULTRAM) 50 MG tablet Take 50 mg by mouth every 6 (six) hours as needed for moderate pain.      No current facility-administered medications for this visit.    Allergies as of 03/31/2015  . (No Known Allergies)    Family History  Problem Relation Age of Onset  . Colon cancer Neg Hx     Social History   Social History  . Marital Status: Widowed    Spouse Name: N/A  . Number of Children: N/A  . Years of Education: N/A   Social History Main Topics  . Smoking status: Never Smoker   . Smokeless tobacco: None     Comment: Never smoked  . Alcohol Use: No  . Drug Use: No  . Sexual Activity: No   Other Topics Concern  . None   Social History Narrative    Review of Systems: General: Negative for anorexia, fever, chills, fatigue, weakness. Eyes: Negative for vision changes.  ENT: Negative for hoarseness, difficulty swallowing. CV: Negative for chest pain, angina, palpitations, peripheral edema.  Respiratory: Negative for dyspnea at rest, cough, sputum, wheezing.  GI: See history of present illness. Endo: Admits weight loss as per above.  Heme: Negative for bruising or bleeding.   Physical Exam: BP 157/84 mmHg  Pulse 73  Temp(Src) 97.5 F (36.4 C) (Oral)  Ht 5\' 4"  (1.626 m)  Wt 175 lb 9.6 oz (79.652 kg)  BMI 30.13 kg/m2 General:   Alert and oriented. Pleasant and cooperative. Well-nourished and  well-developed.  Head:  Normocephalic and atraumatic. Eyes:  Without icterus, sclera clear and conjunctiva pink.  Ears:  Normal auditory acuity. Cardiovascular:  S1, S2 present without murmurs appreciated. Extremities without clubbing or edema. Respiratory:  Clear to auscultation bilaterally. No wheezes, rales, or rhonchi. No distress.  Gastrointestinal:  +BS, soft, and non-distended. Mild left sided abdominal TTP. No HSM noted. No guarding or rebound. No masses appreciated.  Rectal:  Deferred  Neurologic:  Alert and oriented x4;  grossly normal neurologically. Psych:  Alert and cooperative. Normal mood and affect.    03/31/2015 2:17 PM

## 2015-03-31 NOTE — Assessment & Plan Note (Signed)
GERD symptoms currently well-controlled on Prilosec 20 mg twice a day. Continue medications as ordered. Continue to monitor, follow up in one year.

## 2015-03-31 NOTE — Progress Notes (Signed)
CC'D TO PCP °

## 2015-03-31 NOTE — Assessment & Plan Note (Signed)
Patient with a history of constipation, was started on Linzess however she stopped taking this because she no longer felt she needed it. She is now having regular bowel movements which are soft, easy to pass, and no straining. Is not currently expressing constipation. Continue to monitor, notify us of any changes. Follow-up in one year.

## 2015-03-31 NOTE — Assessment & Plan Note (Signed)
Patient with a history of chronic left-sided abdominal pain/flank pain. She's had quite an extensive workup including endoscopy, colonoscopy, multiple CAT scans, MRI. No source for pain has been identified. Objectively she has not lost any significant weight since her last visit one year ago. No other red flag/warning signs or symptoms. Seems to be chronic abdominal pain, possibly abdominal wall pain. We will for her to pain management for further evaluation of likely abdominal wall pain. Follow-up in one year.

## 2015-04-01 ENCOUNTER — Other Ambulatory Visit: Payer: Self-pay

## 2015-04-01 DIAGNOSIS — R109 Unspecified abdominal pain: Secondary | ICD-10-CM

## 2015-04-11 ENCOUNTER — Ambulatory Visit (HOSPITAL_COMMUNITY)
Admission: RE | Admit: 2015-04-11 | Discharge: 2015-04-11 | Disposition: A | Discharge status: Home / Self Care | Payer: Medicare Other | Source: Ambulatory Visit | Attending: Internal Medicine | Admitting: Internal Medicine

## 2015-04-11 DIAGNOSIS — Z1231 Encounter for screening mammogram for malignant neoplasm of breast: Secondary | ICD-10-CM | POA: Insufficient documentation

## 2015-04-15 ENCOUNTER — Encounter (HOSPITAL_COMMUNITY): Payer: Self-pay | Admitting: Psychiatry

## 2015-04-15 ENCOUNTER — Ambulatory Visit (INDEPENDENT_AMBULATORY_CARE_PROVIDER_SITE_OTHER): Payer: Medicare Other | Admitting: Psychiatry

## 2015-04-15 VITALS — BP 164/79 | HR 74 | Ht 64.0 in | Wt 172.0 lb

## 2015-04-15 DIAGNOSIS — F321 Major depressive disorder, single episode, moderate: Secondary | ICD-10-CM

## 2015-04-15 DIAGNOSIS — F459 Somatoform disorder, unspecified: Secondary | ICD-10-CM

## 2015-04-15 MED ORDER — MIRTAZAPINE 7.5 MG PO TABS: 7.5000 mg | ORAL_TABLET | Freq: Every day | ORAL | Status: DC

## 2015-04-15 MED ORDER — ESCITALOPRAM OXALATE 10 MG PO TABS: 10.0000 mg | ORAL_TABLET | Freq: Every day | ORAL | Status: DC

## 2015-04-15 MED ORDER — ALPRAZOLAM 0.25 MG PO TABS: 0.2500 mg | ORAL_TABLET | Freq: Two times a day (BID) | ORAL | Status: DC

## 2015-04-15 NOTE — Progress Notes (Signed)
Patient ID: JYSSICA RIEF, female   DOB: [birth year], 74 y.o.   MRN: [id number] Patient ID: ADALYN PENNOCK, female   DOB: Jul 18, 1940, 74 y.o.   MRN: [id number] Patient ID: DAISIE HAFT, female   DOB: 1941-01-08, 74 y.o.   MRN: [id number] Patient ID: SHATINA STREETS, female   DOB: [birth year], 74 y.o.   MRN: [id number] Patient ID: KYNSLEY WHITEHOUSE, female   DOB: 08-03-1940, 74 y.o.   MRN: [id number] Patient ID: MAILYN STEICHEN, female   DOB: [birth year], 74 y.o.   MRN: [id number] Patient ID: EBONEE STOBER, female   DOB: 20-Sep-1940, 74 y.o.   MRN: [id number] Patient ID: ADAN BEAL, female   DOB: 08-Sep-1940, 74 y.o.   MRN: [id number] Patient ID: SALLYANNE BIRKHEAD, female   DOB: Jul 04, 1940, 73 y.o.   MRN: [id number] Patient ID: ARLENA MARSAN, female   DOB: [birth year], 74 y.o.   MRN: [id number] Patient ID: NYARA CAPELL, female   DOB: 23-Mar-1941, 74 y.o.   MRN: [id number]  Psychiatric Assessment Adult  Patient Identification:  MELYNA HURON Date of Evaluation:  04/15/2015 Chief Complaint: I feel dizzy during the day History of Chief Complaint:   Chief Complaint  Patient presents with  . Depression  . Anxiety  . Follow-up    Depression        Associated symptoms include appetite change.  Past medical history includes anxiety.   Anxiety    Altered Mental Status Associated symptoms include joint swelling.   this patient is a 74 year old widowed black female who lives alonein [place]. She's accompanied by her sister [name] and her son-in-law [name]. I also spoke on the phone to her daughter [name]. The patient used to work in a [employer] but has been retired for several years.  The patient was referred by Dr. Nevada Crane, her primary Dr. Apparently she's not had any history of mental health symptoms until about 2 months ago. She started to have severe stomach pain and has gone to several emergency room this in the local area. She's had 9 emergency room  visits in the last couple of months. Her last one was yesterday. She's had 3 abdominal CTs in the last month, and ultrasound and blood work. Nothing significantly wrong has been found.  The patient is on numerous medications for her stomach but they don't seem to help. She was prescribed Lexapro by her primary doctor but she refused to take it.  she is taking a low dose of Xanax  She returns after 4 weeks. Last time she seemed very drowsy and we determined this is probably from the gabapentin. She has now stopped it and feels much better. She states that her mood is good and she is sleeping fairly well. Her right knee is still hurting a lot and I strongly urged her to go back to the orthopedic surgeons she saw last year. She is not eating that well and has lost weight but she is still somewhat overweight but this concerns her. She used to be on Megace but her insurance will no longer cover it. She will speak to Dr. Nevada Crane about this. Review of Systems  Constitutional: Positive for appetite change and unexpected weight change.  Musculoskeletal: Positive for back pain and joint swelling.  Psychiatric/Behavioral: Positive for depression.   Physical Exam not done  Depressive Symptoms: depressed mood, anhedonia, insomnia, psychomotor agitation, difficulty concentrating, hopelessness, suicidal thoughts with specific plan, anxiety, loss of energy/fatigue, weight loss,  (Hypo) Manic Symptoms:  Elevated Mood:  No Irritable Mood:  Yes Grandiosity:  No Distractibility:  No Labiality of Mood:  Yes Delusions:  Yes Hallucinations:  Yes Impulsivity:  No Sexually Inappropriate Behavior:  No Financial Extravagance:  No Flight of Ideas:  No  Anxiety Symptoms: Excessive Worry:  Yes Panic Symptoms:  No Agoraphobia:  No Obsessive Compulsive: No  Symptoms: None, Specific Phobias:  No Social Anxiety:  No  Psychotic Symptoms:  Hallucinations: Yes Visual Delusions:  Yes Paranoia:  No   Ideas  of Reference:  No  PTSD Symptoms: Ever had a traumatic exposure:  No Had a traumatic exposure in the last month:  No Re-experiencing: No None Hypervigilance:  No Hyperarousal: No None Avoidance: No None  Traumatic Brain Injury: No   Past Psychiatric History: Diagnosis: None   Hospitalizations: None   Outpatient Care: None   Substance Abuse Care: None   Self-Mutilation: None   Suicidal Attempts: None   Violent Behaviors: None    Past Medical History:   Past Medical History  Diagnosis Date  . Hypertension   . High cholesterol   . Chronic back pain   . Anxiety   . Shortness of breath      even walking causes sob...   . Arthritis     in back .   Marland Kitchen Pneumonia   . Borderline diabetes   . Chronic abdominal pain   . Chronic constipation   . Chronic nausea   . Noncompliance with medication regimen   . GERD (gastroesophageal reflux disease)   . Poor historian    History of Loss of Consciousness:  No Seizure History:  No Cardiac History:  No Allergies:  No Known Allergies Current Medications:  Current Outpatient Prescriptions  Medication Sig Dispense Refill  . ALPRAZolam (XANAX) 0.25 MG tablet Take 1 tablet (0.25 mg total) by mouth 2 (two) times daily. 60 tablet 2  . escitalopram (LEXAPRO) 10 MG tablet Take 1 tablet (10 mg total) by mouth daily. 90 tablet 2  . mirtazapine (REMERON) 7.5 MG tablet Take 1 tablet (7.5 mg total) by mouth at bedtime. 90 tablet 2  . nebivolol (BYSTOLIC) 5 MG tablet Take 5 mg by mouth daily.    Marland Kitchen omeprazole (PRILOSEC) 20 MG capsule TAKE 2 CAPSULES BY MOUTH TWICE A DAY BEFORE MEALS 180 capsule 3  . PROAIR HFA 108 (90 BASE) MCG/ACT inhaler Inhale 1-2 puffs into the lungs every 4 (four) hours as needed for wheezing or shortness of breath.      No current facility-administered medications for this visit.    Previous Psychotropic Medications:  Medication Dose   Xanax   0.25 mg every 8 hours as needed                      Substance Abuse  History in the last 12 months: Substance Age of 1st Use Last Use Amount Specific Type  Nicotine      Alcohol      Cannabis      Opiates      Cocaine      Methamphetamines      LSD      Ecstasy      Benzodiazepines      Caffeine      Inhalants      Others:                          Medical Consequences of Substance Abuse: n/a  Legal Consequences of Substance  Abuse: n/a  Family Consequences of Substance Abuse: n/a  Blackouts:  No DT's:  No Withdrawal Symptoms:  No None  Social History: Current Place of Residence: [place] of Birth: Unknown Family Members: Lives with son Marital Status:  Widowed Children:   Sons: 1  Daughters: 1 Relationships:  Education:  [employer] Problems/Performance:  Religious Beliefs/Practices: Unknown History of Abuse: none Pensions consultant; Manufacturing engineer History:  None. Legal History: None Hobbies/Interests: Church  Family History:   Family History  Problem Relation Age of Onset  . Colon cancer Neg Hx     Mental Status Examination/Evaluation: Objective:  Appearance:   She is neatly dressed, pleasant   Eye Contact::  Good   Speech:  Normal   Volume:  Decreased  Mood: Good  Affect:  Euthymic   Thought Process:  Organized   Orientation:  Full (Time, Place, and Person)  Thought Content:  No delusions or hallucinations   Suicidal Thoughts:  No  Homicidal Thoughts:  No  Judgement:  Impaired  Insight:  Lacking  Psychomotor Activity:  Normal   Akathisia:  No  Handed:  Right  AIMS (if indicated):    Assets:  [place] of knowledge memory and language are all good as well  Oceanographer)        Assessment:  Maj. depression, single episode with psychotic features and somatization disorder  AXIS I Major Depression, single episode with psychotic features, and somatization disorder   AXIS II Deferred  AXIS III Past Medical History   Diagnosis Date  . Hypertension   . High cholesterol   . Chronic back pain   . Anxiety   . Shortness of breath      even walking causes sob...   . Arthritis     in back .   Marland Kitchen Pneumonia   . Borderline diabetes   . Chronic abdominal pain   . Chronic constipation   . Chronic nausea   . Noncompliance with medication regimen   . GERD (gastroesophageal reflux disease)   . Poor historian      AXIS IV other psychosocial or environmental problems  AXIS V 11-20 some danger of hurting self or others possible OR occasionally fails to maintain minimal personal hygiene OR gross impairment in communication   Treatment Plan/Recommendations:  Plan of Care: Medication management   Laboratory:    Psychotherapy: She is scheduled to see Maurice Small   Medications: The patient will continue mirtazapine 7.5 mg each bedtime as this helped her sleep. She'll continue Lexapro 10 mg every morning for depression   She will continue Xanax 0.25 mg 2 times a day also for anxiety   Routine PRN Medications:  No  Consultations:   Safety Concerns:  She denies any thoughts or plans to hurt self or others   Other:  She'll return in 3 months     [name], MD 10/14/20169:53 AM

## 2015-05-03 ENCOUNTER — Ambulatory Visit (INDEPENDENT_AMBULATORY_CARE_PROVIDER_SITE_OTHER): Payer: Medicare Other

## 2015-05-03 ENCOUNTER — Ambulatory Visit (INDEPENDENT_AMBULATORY_CARE_PROVIDER_SITE_OTHER): Payer: Medicare Other | Admitting: Orthopedic Surgery

## 2015-05-03 VITALS — BP 164/72 | Ht 64.0 in | Wt 175.6 lb

## 2015-05-03 DIAGNOSIS — M25561 Pain in right knee: Secondary | ICD-10-CM

## 2015-05-03 DIAGNOSIS — M5137 Other intervertebral disc degeneration, lumbosacral region: Secondary | ICD-10-CM | POA: Diagnosis not present

## 2015-05-03 DIAGNOSIS — M1711 Unilateral primary osteoarthritis, right knee: Secondary | ICD-10-CM | POA: Diagnosis not present

## 2015-05-03 NOTE — Progress Notes (Signed)
Patient ID: Audrey Johnson, female   DOB: 08/27/1940, 74 y.o.   MRN: [id number]  Chief Complaint  Patient presents with  . Follow-up    follow up right knee pain    HPI Audrey Johnson is a 74 y.o. female.  I previously worked this patient up and found her to have osteoarthritis of the right knee and severe degenerative disc disease of lumbar spine. She was sent for neurosurgical consultation and they recommended that she have an epidural steroid series which she did have.  She presents back complaining of right lateral leg pain with numbness and tingling as well as lateral knee pain.  No recent trauma  Review of Systems Review of Systemsback and leg pain radicular pain constipation nausea bladder function intact normal no complaints  Past Medical History  Diagnosis Date  . Hypertension   . High cholesterol   . Chronic back pain   . Anxiety   . Shortness of breath      even walking causes sob...   . Arthritis     in back .   Marland Kitchen Pneumonia   . Borderline diabetes   . Chronic abdominal pain   . Chronic constipation   . Chronic nausea   . Noncompliance with medication regimen   . GERD (gastroesophageal reflux disease)   . Poor historian     Past Surgical History  Procedure Laterality Date  . Abdominal hysterectomy    . Toe surgery Right     5th toe, not sure what was wrong with it  . Dilation and curettage of uterus    . Cardiac catheterization    . Colonoscopy  04/16/2007    RMR: Normal rectum and colon  . Colonoscopy with esophagogastroduodenoscopy (egd) N/A 01/08/2013    YQM:VHQI erosive reflux esophagitis. Negative H.pylori. Small hiatal hernia-s/p bx (gastritis)-TCS/Colonic polyp-hyperplastic  . Left heart catheterization with coronary angiogram N/A 06/05/2012    Procedure: LEFT HEART CATHETERIZATION WITH CORONARY ANGIOGRAM;  Surgeon: Hillary Bow, MD;  Location: [facility] CATH LAB;  Service: Cardiovascular;  Laterality: N/A;  . Breast surgery  unknown (many yrs)     excision  of lump right breast  was per pt "nothing"   . Cataract extraction w/phaco Left 02/21/2015    Procedure: CATARACT EXTRACTION PHACO AND INTRAOCULAR LENS PLACEMENT LEFT EYE CDE=5.86;  Surgeon: Tonny Branch, MD;  Location: [facility];  Service: Ophthalmology;  Laterality: Left;  . Cataract extraction w/phaco Right 03/14/2015    Procedure: CATARACT EXTRACTION PHACO AND INTRAOCULAR LENS PLACEMENT (IOC);  Surgeon: Tonny Branch, MD;  Location: [facility];  Service: Ophthalmology;  Laterality: Right;  CDE:6.36    Family History  Problem Relation Age of Onset  . Colon cancer Neg Hx     Social History Social History  Substance Use Topics  . Smoking status: Never Smoker   . Smokeless tobacco: Not on file     Comment: Never smoked  . Alcohol Use: No    No Known Allergies  Current Outpatient Prescriptions  Medication Sig Dispense Refill  . ALPRAZolam (XANAX) 0.25 MG tablet Take 1 tablet (0.25 mg total) by mouth 2 (two) times daily. 60 tablet 2  . escitalopram (LEXAPRO) 10 MG tablet Take 1 tablet (10 mg total) by mouth daily. 90 tablet 2  . mirtazapine (REMERON) 7.5 MG tablet Take 1 tablet (7.5 mg total) by mouth at bedtime. 90 tablet 2  . nebivolol (BYSTOLIC) 5 MG tablet Take 5 mg by mouth daily.    Marland Kitchen omeprazole (PRILOSEC) 20  MG capsule TAKE 2 CAPSULES BY MOUTH TWICE A DAY BEFORE MEALS 180 capsule 3  . PROAIR HFA 108 (90 BASE) MCG/ACT inhaler Inhale 1-2 puffs into the lungs every 4 (four) hours as needed for wheezing or shortness of breath.      No current facility-administered medications for this visit.       Physical Exam Physical Exam Blood pressure 164/72, height 5\' 4"  (1.626 m), weight 175 lb 9.6 oz (79.652 kg). Appearance, there are no abnormalities in terms of appearance the patient was well-developed and well-nourished. The grooming and hygiene were normal.  Mental status orientation, there was normal alertness and orientation Mood pleasant Ambulatory status abnormal with  cane assistance Examination of the she does have some mild tenderness related to her knee on the right knee Inspection no effusion Range of motion arc of flexion is 120 Tests for stability all ligament stable Motor strength  Muscle tone is normal Skin warm dry and intact without laceration or ulceration or erythema Neurologic examination normal sensation Vascular examination normal pulses with warm extremity and normal capillary refill  The opposite knee reveals no swelling or effusion, 120 rom, normal muscle tone, anterior and posterior drawer test stable neurovascular exam intact  Data Reviewed I reviewed her old lumbar spine imaging and she had severe multilevel spondylosis and eventually had epidurals which I recommend repeat  Reread injected her right knee follow-up 6 months  She complained of bilateral numbness and tingling of the upper extremities and night pain with loss of grip function at the end of the visit so we center for carpal tunnel nerve conduction study    Assessment  Osteoarthritis right knee Lumbar spinal stenosis Carpal tunnel syndrome   Plan  As indicated  Procedure note left knee injection verbal consent was obtained to inject left knee joint  Timeout was completed to confirm the site of injection  The medications used were 40 mg of Depo-Medrol and 1% lidocaine 3 cc  Anesthesia was provided by ethyl chloride and the skin was prepped with alcohol.  After cleaning the skin with alcohol a 20-gauge needle was used to inject the left knee joint. There were no complications. A sterile bandage was applied.

## 2015-05-03 NOTE — Patient Instructions (Signed)
Order NCS for CTS.  Order ESI's of L-spine.

## 2015-05-04 ENCOUNTER — Encounter: Payer: Self-pay | Admitting: Orthopedic Surgery

## 2015-05-04 DIAGNOSIS — R634 Abnormal weight loss: Secondary | ICD-10-CM | POA: Diagnosis not present

## 2015-05-04 DIAGNOSIS — R1084 Generalized abdominal pain: Secondary | ICD-10-CM | POA: Diagnosis not present

## 2015-05-04 DIAGNOSIS — K591 Functional diarrhea: Secondary | ICD-10-CM | POA: Diagnosis not present

## 2015-05-04 DIAGNOSIS — K5900 Constipation, unspecified: Secondary | ICD-10-CM | POA: Diagnosis not present

## 2015-05-09 ENCOUNTER — Other Ambulatory Visit: Payer: Self-pay | Admitting: *Deleted

## 2015-05-09 DIAGNOSIS — M48061 Spinal stenosis, lumbar region without neurogenic claudication: Secondary | ICD-10-CM

## 2015-05-10 ENCOUNTER — Telehealth: Payer: Self-pay | Admitting: Orthopedic Surgery

## 2015-05-10 NOTE — Telephone Encounter (Signed)
Notice received via fax of appointment scheduled at [facility], ph# [phone], fax [phone], for 05/16/15, 10:00am; patient aware.

## 2015-05-12 ENCOUNTER — Telehealth: Payer: Self-pay | Admitting: Orthopedic Surgery

## 2015-05-12 NOTE — Telephone Encounter (Signed)
Called back to [facility]; left message and also faxed response.  Patient aware of status.

## 2015-05-12 NOTE — Telephone Encounter (Signed)
Call received from [place] at [facility] regarding referral for bilateral hand pain/carpal tunnel.  States received office note, however, not clear on what patient is being referred to have done. Is patient to have Nerve conduction study there?  (Not finding referral entry in chart)  - please call [phone] and fax [phone]

## 2015-05-12 NOTE — Telephone Encounter (Signed)
Yes nerve conduction study there, was referred by PCP

## 2015-05-16 ENCOUNTER — Ambulatory Visit
Admission: RE | Admit: 2015-05-16 | Discharge: 2015-05-16 | Disposition: A | Discharge status: Home / Self Care | Payer: Medicare Other | Source: Ambulatory Visit | Attending: Orthopedic Surgery | Admitting: Orthopedic Surgery

## 2015-05-16 DIAGNOSIS — M545 Low back pain: Secondary | ICD-10-CM | POA: Diagnosis not present

## 2015-05-16 DIAGNOSIS — M5416 Radiculopathy, lumbar region: Secondary | ICD-10-CM | POA: Diagnosis not present

## 2015-05-16 DIAGNOSIS — M48061 Spinal stenosis, lumbar region without neurogenic claudication: Secondary | ICD-10-CM

## 2015-05-16 MED ORDER — IOHEXOL 180 MG/ML  SOLN
1.0000 mL | Freq: Once | INTRAMUSCULAR | Status: DC | PRN
  Administered 2015-05-16: 1 mL via EPIDURAL

## 2015-05-16 MED ORDER — METHYLPREDNISOLONE ACETATE 40 MG/ML INJ SUSP (RADIOLOG
120.0000 mg | Freq: Once | INTRAMUSCULAR | Status: AC
  Administered 2015-05-16: 120 mg via EPIDURAL

## 2015-05-16 NOTE — Discharge Instructions (Signed)

## 2015-06-17 DIAGNOSIS — M25569 Pain in unspecified knee: Secondary | ICD-10-CM | POA: Diagnosis not present

## 2015-06-17 DIAGNOSIS — K591 Functional diarrhea: Secondary | ICD-10-CM | POA: Diagnosis not present

## 2015-06-17 DIAGNOSIS — R1084 Generalized abdominal pain: Secondary | ICD-10-CM | POA: Diagnosis not present

## 2015-06-17 DIAGNOSIS — K5900 Constipation, unspecified: Secondary | ICD-10-CM | POA: Diagnosis not present

## 2015-06-17 DIAGNOSIS — R634 Abnormal weight loss: Secondary | ICD-10-CM | POA: Diagnosis not present

## 2015-07-11 ENCOUNTER — Encounter: Payer: Medicare Other | Admitting: Neurology

## 2015-07-15 ENCOUNTER — Encounter (HOSPITAL_COMMUNITY): Payer: Self-pay | Admitting: Psychiatry

## 2015-07-15 ENCOUNTER — Ambulatory Visit (INDEPENDENT_AMBULATORY_CARE_PROVIDER_SITE_OTHER): Payer: Medicare Other | Admitting: Psychiatry

## 2015-07-15 ENCOUNTER — Ambulatory Visit (HOSPITAL_COMMUNITY): Payer: Self-pay | Admitting: Psychiatry

## 2015-07-15 VITALS — BP 140/82 | Ht 64.0 in | Wt 176.0 lb

## 2015-07-15 DIAGNOSIS — F321 Major depressive disorder, single episode, moderate: Secondary | ICD-10-CM | POA: Diagnosis not present

## 2015-07-15 DIAGNOSIS — K591 Functional diarrhea: Secondary | ICD-10-CM | POA: Diagnosis not present

## 2015-07-15 DIAGNOSIS — F459 Somatoform disorder, unspecified: Secondary | ICD-10-CM

## 2015-07-15 DIAGNOSIS — K5900 Constipation, unspecified: Secondary | ICD-10-CM | POA: Diagnosis not present

## 2015-07-15 DIAGNOSIS — R1084 Generalized abdominal pain: Secondary | ICD-10-CM | POA: Diagnosis not present

## 2015-07-15 DIAGNOSIS — R634 Abnormal weight loss: Secondary | ICD-10-CM | POA: Diagnosis not present

## 2015-07-15 MED ORDER — ALPRAZOLAM 0.25 MG PO TABS: 0.2500 mg | ORAL_TABLET | Freq: Two times a day (BID) | ORAL | Status: DC

## 2015-07-15 MED ORDER — MIRTAZAPINE 7.5 MG PO TABS: 7.5000 mg | ORAL_TABLET | Freq: Every day | ORAL | Status: DC

## 2015-07-15 MED ORDER — ESCITALOPRAM OXALATE 10 MG PO TABS: 10.0000 mg | ORAL_TABLET | Freq: Every day | ORAL | Status: DC

## 2015-07-15 NOTE — Progress Notes (Signed)
Patient ID: Audrey Johnson, female   DOB: 01-03-1941, 75 y.o.   MRN: [id number] Patient ID: Audrey Johnson, female   DOB: May 11, 1941, 75 y.o.   MRN: [id number] Patient ID: Audrey Johnson, female   DOB: 1940/12/31, 75 y.o.   MRN: [id number] Patient ID: Audrey Johnson, female   DOB: July 04, 1940, 75 y.o.   MRN: [id number] Patient ID: Audrey Johnson, female   DOB: 1940-11-04, 75 y.o.   MRN: [id number] Patient ID: Audrey Johnson, female   DOB: [birth year], 75 y.o.   MRN: [id number] Patient ID: Audrey Johnson, female   DOB: Jun 01, 1941, 75 y.o.   MRN: [id number] Patient ID: Audrey Johnson, female   DOB: June 05, 1941, 75 y.o.   MRN: [id number] Patient ID: Audrey Johnson, female   DOB: 27-Aug-1940, 75 y.o.   MRN: [id number] Patient ID: Audrey Johnson, female   DOB: Nov 22, 1940, 75 y.o.   MRN: [id number] Patient ID: Audrey Johnson, female   DOB: 03/31/1941, 75 y.o.   MRN: [id number] Patient ID: Audrey Johnson, female   DOB: [birth year], 75 y.o.   MRN: [id number]  Psychiatric Assessment Adult  Patient Identification:  Audrey Johnson Date of Evaluation:  07/15/2015 Chief Complaint: I feel dizzy during the day History of Chief Complaint:   Chief Complaint  Patient presents with  . Depression  . Anxiety  . Follow-up    Depression        Associated symptoms include appetite change.  Past medical history includes anxiety.   Anxiety    Altered Mental Status Associated symptoms include joint swelling.   this patient is a 75 year old widowed black female who lives alonein [place].The patient used to work in a [employer] but has been retired for several years.  The patient was referred by Dr. Nevada Crane, her primary Dr. Apparently she's not had any history of mental health symptoms until about 2 months ago. She started to have severe stomach pain and has gone to several emergency room this in the local area. She's had 9 emergency room visits in the last couple of  months. Her last one was yesterday. She's had 3 abdominal CTs in the last month, and ultrasound and blood work. Nothing significantly wrong has been found.  The patient is on numerous medications for her stomach but they don't seem to help. She was prescribed Lexapro by her primary doctor but she refused to take it.  she is taking a low dose of Xanax  She returns after 3 months. She is doing quite well. She got an injection in her back and now her movement is better and her leg is hurting less. Her energy is good and she is back on Megace and her appetite has improved. She's had no further stomach problems. She is sleeping well at night. 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  Musculoskeletal: Positive for back pain and joint swelling.  Psychiatric/Behavioral: Positive for depression.   Physical Exam not done  Depressive Symptoms: depressed mood, anhedonia, insomnia, psychomotor agitation, difficulty concentrating, hopelessness, suicidal thoughts with specific plan, anxiety, loss of energy/fatigue, weight loss,  (Hypo) Manic Symptoms:   Elevated Mood:  No Irritable Mood:  Yes Grandiosity:  No Distractibility:  No Labiality of Mood:  Yes Delusions:  Yes Hallucinations:  Yes Impulsivity:  No Sexually Inappropriate Behavior:  No Financial Extravagance:  No Flight of Ideas:  No  Anxiety Symptoms: Excessive Worry:  Yes Panic Symptoms:  No Agoraphobia:  No Obsessive Compulsive: No  Symptoms: None, Specific Phobias:  No Social Anxiety:  No  Psychotic Symptoms:  Hallucinations: Yes Visual Delusions:  Yes Paranoia:  No   Ideas of Reference:  No  PTSD Symptoms: Ever had a traumatic exposure:  No Had a traumatic exposure in the last month:  No Re-experiencing: No None Hypervigilance:  No Hyperarousal: No None Avoidance: No  None  Traumatic Brain Injury: No   Past Psychiatric History: Diagnosis: None   Hospitalizations: None   Outpatient Care: None   Substance Abuse Care: None   Self-Mutilation: None   Suicidal Attempts: None   Violent Behaviors: None    Past Medical History:   Past Medical History  Diagnosis Date  . Hypertension   . High cholesterol   . Chronic back pain   . Anxiety   . Shortness of breath      even walking causes sob...   . Arthritis     in back .   Marland Kitchen Pneumonia   . Borderline diabetes   . Chronic abdominal pain   . Chronic constipation   . Chronic nausea   . Noncompliance with medication regimen   . GERD (gastroesophageal reflux disease)   . Poor historian    History of Loss of Consciousness:  No Seizure History:  No Cardiac History:  No Allergies:  No Known Allergies Current Medications:  Current Outpatient Prescriptions  Medication Sig Dispense Refill  . ALPRAZolam (XANAX) 0.25 MG tablet Take 1 tablet (0.25 mg total) by mouth 2 (two) times daily. 60 tablet 2  . escitalopram (LEXAPRO) 10 MG tablet Take 1 tablet (10 mg total) by mouth daily. 90 tablet 2  . gabapentin (NEURONTIN) 100 MG capsule 3 (three) times daily.     . megestrol (MEGACE) 40 MG/ML suspension     . mirtazapine (REMERON) 7.5 MG tablet Take 1 tablet (7.5 mg total) by mouth at bedtime. 90 tablet 2  . nebivolol (BYSTOLIC) 5 MG tablet Take 5 mg by mouth daily.    Marland Kitchen omeprazole (PRILOSEC) 20 MG capsule TAKE 2 CAPSULES BY MOUTH TWICE A DAY BEFORE MEALS 180 capsule 3  . PROAIR HFA 108 (90 BASE) MCG/ACT inhaler Inhale 1-2 puffs into the lungs every 4 (four) hours as needed for wheezing or shortness of breath.      No current facility-administered medications for this visit.    Previous Psychotropic Medications:  Medication Dose   Xanax   0.25 mg every 8 hours as needed                      Substance Abuse History in the last 12 months: Substance Age of 1st Use Last Use Amount Specific Type   Nicotine      Alcohol      Cannabis      Opiates      Cocaine      Methamphetamines      LSD      Ecstasy      Benzodiazepines      Caffeine      Inhalants      Others:                          Medical Consequences of Substance Abuse: n/a  Legal Consequences of Substance Abuse: n/a  Family Consequences of Substance Abuse: n/a  Blackouts:  No DT's:  No Withdrawal  Symptoms:  No None  Social History: Current Place of Residence: [place] of Birth: Unknown Family Members: Lives with son Marital Status:  Widowed Children:   Sons: 1  Daughters: 1 Relationships:  Education:  [employer] Problems/Performance:  Religious Beliefs/Practices: Unknown History of Abuse: none Pensions consultant; Manufacturing engineer History:  None. Legal History: None Hobbies/Interests: Church  Family History:   Family History  Problem Relation Age of Onset  . Colon cancer Neg Hx     Mental Status Examination/Evaluation: Objective:  Appearance:   She is neatly dressed, pleasant   Eye Contact::  Good   Speech:  Normal   Volume:  Decreased  Mood: Good  Affect:  Euthymic   Thought Process:  Organized   Orientation:  Full (Time, Place, and Person)  Thought Content:  No delusions or hallucinations   Suicidal Thoughts:  No  Homicidal Thoughts:  No  Judgement:  Impaired  Insight:  Lacking  Psychomotor Activity:  Normal   Akathisia:  No  Handed:  Right  AIMS (if indicated):    Assets:  [place] of knowledge memory and language are all good as well  Oceanographer)        Assessment:  Maj. depression, single episode with psychotic features and somatization disorder  AXIS I Major Depression, single episode with psychotic features, and somatization disorder   AXIS II Deferred  AXIS III Past Medical History  Diagnosis Date  . Hypertension   . High cholesterol   . Chronic back pain   . Anxiety    . Shortness of breath      even walking causes sob...   . Arthritis     in back .   Marland Kitchen Pneumonia   . Borderline diabetes   . Chronic abdominal pain   . Chronic constipation   . Chronic nausea   . Noncompliance with medication regimen   . GERD (gastroesophageal reflux disease)   . Poor historian      AXIS IV other psychosocial or environmental problems  AXIS V 11-20 some danger of hurting self or others possible OR occasionally fails to maintain minimal personal hygiene OR gross impairment in communication   Treatment Plan/Recommendations:  Plan of Care: Medication management   Laboratory:    Psychotherapy: She is scheduled to see Maurice Small   Medications: The patient will continue mirtazapine 7.5 mg each bedtime as this helped her sleep. She'll continue Lexapro 10 mg every morning for depression   She will continue Xanax 0.25 mg 2 times a day also for anxiety   Routine PRN Medications:  No  Consultations:   Safety Concerns:  She denies any thoughts or plans to hurt self or others   Other:  She'll return in 3 months     Audrey Spiller, MD 1/13/20179:37 AM

## 2015-09-10 ENCOUNTER — Other Ambulatory Visit (HOSPITAL_COMMUNITY): Payer: Self-pay | Admitting: Psychiatry

## 2015-09-14 ENCOUNTER — Telehealth (HOSPITAL_COMMUNITY): Payer: Self-pay | Admitting: *Deleted

## 2015-09-14 NOTE — Telephone Encounter (Addendum)
Spoke with pt and she stated she is out of her Gabapentin. Per pt chart, it look like it was d/c. Per pt, she never stopped taking medication. Per pt, she had enough refills but the medication expired and not she need refills. Pt number [id number].

## 2015-09-15 ENCOUNTER — Other Ambulatory Visit (HOSPITAL_COMMUNITY): Payer: Self-pay | Admitting: Psychiatry

## 2015-09-15 MED ORDER — GABAPENTIN 100 MG PO CAPS: 100.0000 mg | ORAL_CAPSULE | Freq: Three times a day (TID) | ORAL | Status: DC

## 2015-09-15 NOTE — Telephone Encounter (Signed)
Sent in

## 2015-09-15 NOTE — Telephone Encounter (Signed)
Informed pt and she showed understanding.  

## 2015-10-12 ENCOUNTER — Encounter (HOSPITAL_COMMUNITY): Payer: Self-pay | Admitting: Psychiatry

## 2015-10-12 ENCOUNTER — Telehealth (HOSPITAL_COMMUNITY): Payer: Self-pay | Admitting: *Deleted

## 2015-10-12 ENCOUNTER — Ambulatory Visit (INDEPENDENT_AMBULATORY_CARE_PROVIDER_SITE_OTHER): Payer: Medicare Other | Admitting: Psychiatry

## 2015-10-12 VITALS — BP 121/48 | HR 73 | Ht 64.0 in | Wt 183.4 lb

## 2015-10-12 DIAGNOSIS — F321 Major depressive disorder, single episode, moderate: Secondary | ICD-10-CM

## 2015-10-12 MED ORDER — ESCITALOPRAM OXALATE 10 MG PO TABS: 10.0000 mg | ORAL_TABLET | Freq: Every day | ORAL | Status: DC

## 2015-10-12 MED ORDER — ALPRAZOLAM 0.25 MG PO TABS: 0.2500 mg | ORAL_TABLET | Freq: Two times a day (BID) | ORAL | Status: DC

## 2015-10-12 MED ORDER — GABAPENTIN 100 MG PO CAPS: 100.0000 mg | ORAL_CAPSULE | Freq: Three times a day (TID) | ORAL | Status: DC

## 2015-10-12 MED ORDER — MIRTAZAPINE 7.5 MG PO TABS: 7.5000 mg | ORAL_TABLET | Freq: Every day | ORAL | Status: DC

## 2015-10-12 NOTE — Progress Notes (Signed)
Patient ID: Audrey Johnson, female   DOB: 05-02-1941, 75 y.o.   MRN: [id number] Patient ID: Audrey Johnson, female   DOB: 22-Aug-1940, 75 y.o.   MRN: [id number] Patient ID: Audrey Johnson, female   DOB: 1940-10-01, 75 y.o.   MRN: [id number] Patient ID: Audrey Johnson, female   DOB: 1940-12-29, 75 y.o.   MRN: [id number] Patient ID: Audrey Johnson, female   DOB: June 02, 1941, 75 y.o.   MRN: [id number] Patient ID: Audrey Johnson, female   DOB: [birth year], 75 y.o.   MRN: [id number] Patient ID: Audrey Johnson, female   DOB: [birth year], 75 y.o.   MRN: [id number] Patient ID: Audrey Johnson, female   DOB: [birth year], 75 y.o.   MRN: [id number] Patient ID: Audrey Johnson, female   DOB: Jun 16, 1941, 75 y.o.   MRN: [id number] Patient ID: Audrey Johnson, female   DOB: 12/04/1940, 75 y.o.   MRN: [id number] Patient ID: Audrey Johnson, female   DOB: 1941/03/21, 75 y.o.   MRN: [id number] Patient ID: Audrey Johnson, female   DOB: Jul 30, 1940, 75 y.o.   MRN: [id number] Patient ID: Audrey Johnson, female   DOB: July 12, 1940, 75 y.o.   MRN: [id number]  Psychiatric Assessment Adult  Patient Identification:  Audrey Johnson Date of Evaluation:  10/12/2015 Chief Complaint: I feel dizzy during the day History of Chief Complaint:   Chief Complaint  Patient presents with  . Depression  . Anxiety  . Follow-up    Depression        Associated symptoms include appetite change.  Past medical history includes anxiety.   Anxiety    Altered Mental Status Associated symptoms include joint swelling.   this patient is a 75 year old widowed black female who lives alonein [place].The patient used to work in a [employer] but has been retired for several years.  The patient was referred by Dr. Nevada Crane, her primary Dr. Apparently she's not had any history of mental health symptoms until about 2 months ago. She started to have severe stomach pain and has gone to several emergency room  this in the local area. She's had 9 emergency room visits in the last couple of months. Her last one was yesterday. She's had 3 abdominal CTs in the last month, and ultrasound and blood work. Nothing significantly wrong has been found.  The patient is on numerous medications for her stomach but they don't seem to help. She was prescribed Lexapro by her primary doctor but she refused to take it.  she is taking a low dose of Xanax  She returns after 3 months. She is limping today and states that her back and knee hurt. She needs to go back for another injection. Her mood is been good she is eating well and she denies any symptoms of anxiety or insomnia. She feels that her medications are still very helpful. Review of Systems  Constitutional: Positive for appetite change and unexpected weight change.  Musculoskeletal: Positive for back pain and joint swelling.  Psychiatric/Behavioral: Positive for depression.   Physical Exam not done  Depressive Symptoms: depressed mood, anhedonia, insomnia, psychomotor agitation, difficulty concentrating, hopelessness, suicidal thoughts with specific plan, anxiety, loss of energy/fatigue, weight loss,  (Hypo) Manic Symptoms:   Elevated Mood:  No Irritable Mood:  Yes Grandiosity:  No Distractibility:  No Labiality of Mood:  Yes Delusions:  Yes Hallucinations:  Yes Impulsivity:  No Sexually Inappropriate Behavior:  No Financial Extravagance:  No Flight of Ideas:  No  Anxiety Symptoms:  Excessive Worry:  Yes Panic Symptoms:  No Agoraphobia:  No Obsessive Compulsive: No  Symptoms: None, Specific Phobias:  No Social Anxiety:  No  Psychotic Symptoms:  Hallucinations: Yes Visual Delusions:  Yes Paranoia:  No   Ideas of Reference:  No  PTSD Symptoms: Ever had a traumatic exposure:  No Had a traumatic exposure in the last month:  No Re-experiencing: No None Hypervigilance:  No Hyperarousal: No None Avoidance: No None  Traumatic Brain  Injury: No   Past Psychiatric History: Diagnosis: None   Hospitalizations: None   Outpatient Care: None   Substance Abuse Care: None   Self-Mutilation: None   Suicidal Attempts: None   Violent Behaviors: None    Past Medical History:   Past Medical History  Diagnosis Date  . Hypertension   . High cholesterol   . Chronic back pain   . Anxiety   . Shortness of breath      even walking causes sob...   . Arthritis     in back .   Marland Kitchen Pneumonia   . Borderline diabetes   . Chronic abdominal pain   . Chronic constipation   . Chronic nausea   . Noncompliance with medication regimen   . GERD (gastroesophageal reflux disease)   . Poor historian    History of Loss of Consciousness:  No Seizure History:  No Cardiac History:  No Allergies:  No Known Allergies Current Medications:  Current Outpatient Prescriptions  Medication Sig Dispense Refill  . ALPRAZolam (XANAX) 0.25 MG tablet Take 1 tablet (0.25 mg total) by mouth 2 (two) times daily. 60 tablet 2  . escitalopram (LEXAPRO) 10 MG tablet Take 1 tablet (10 mg total) by mouth daily. 90 tablet 2  . gabapentin (NEURONTIN) 100 MG capsule Take 1 capsule (100 mg total) by mouth 3 (three) times daily. 90 capsule 2  . megestrol (MEGACE) 40 MG/ML suspension     . mirtazapine (REMERON) 7.5 MG tablet Take 1 tablet (7.5 mg total) by mouth at bedtime. 90 tablet 2  . nebivolol (BYSTOLIC) 5 MG tablet Take 5 mg by mouth daily.    Marland Kitchen omeprazole (PRILOSEC) 20 MG capsule TAKE 2 CAPSULES BY MOUTH TWICE A DAY BEFORE MEALS 180 capsule 3  . PROAIR HFA 108 (90 BASE) MCG/ACT inhaler Inhale 1-2 puffs into the lungs every 4 (four) hours as needed for wheezing or shortness of breath.      No current facility-administered medications for this visit.    Previous Psychotropic Medications:  Medication Dose   Xanax   0.25 mg every 8 hours as needed                      Substance Abuse History in the last 12 months: Substance Age of 1st Use Last Use  Amount Specific Type  Nicotine      Alcohol      Cannabis      Opiates      Cocaine      Methamphetamines      LSD      Ecstasy      Benzodiazepines      Caffeine      Inhalants      Others:                          Medical Consequences of Substance Abuse: n/a  Legal Consequences of Substance Abuse: n/a  Family Consequences of Substance Abuse: n/a  Blackouts:  No  DT's:  No Withdrawal Symptoms:  No None  Social History: Current Place of Residence: [place] of Birth: Unknown Family Members: Lives with son Marital Status:  Widowed Children:   Sons: 1  Daughters: 1 Relationships:  Education:  [employer] Problems/Performance:  Religious Beliefs/Practices: Unknown History of Abuse: none Pensions consultant; Manufacturing engineer History:  None. Legal History: None Hobbies/Interests: Church  Family History:   Family History  Problem Relation Age of Onset  . Colon cancer Neg Hx     Mental Status Examination/Evaluation: Objective:  Appearance:   She is neatly dressed, pleasant,Walking with a limp   Eye Contact::  Good   Speech:  Normal   Volume:  Decreased  Mood: Good  Affect:  Euthymic   Thought Process:  Organized   Orientation:  Full (Time, Place, and Person)  Thought Content:  No delusions or hallucinations   Suicidal Thoughts:  No  Homicidal Thoughts:  No  Judgement:  Impaired  Insight:  Lacking  Psychomotor Activity:  Normal   Akathisia:  No  Handed:  Right  AIMS (if indicated):    Assets:  Social Support  Fund of knowledge memory and language are all good as well  Laboratory/X-Ray Psychological Evaluation(s)        Assessment:  Maj. depression, single episode with psychotic features and somatization disorder  AXIS I Major Depression, single episode with psychotic features, and somatization disorder   AXIS II Deferred  AXIS III Past Medical History  Diagnosis Date  . Hypertension   . High  cholesterol   . Chronic back pain   . Anxiety   . Shortness of breath      even walking causes sob...   . Arthritis     in back .   Marland Kitchen Pneumonia   . Borderline diabetes   . Chronic abdominal pain   . Chronic constipation   . Chronic nausea   . Noncompliance with medication regimen   . GERD (gastroesophageal reflux disease)   . Poor historian      AXIS IV other psychosocial or environmental problems  AXIS V 11-20 some danger of hurting self or others possible OR occasionally fails to maintain minimal personal hygiene OR gross impairment in communication   Treatment Plan/Recommendations:  Plan of Care: Medication management   Laboratory:    Psychotherapy: She is scheduled to see Maurice Small   Medications: The patient will continue mirtazapine 7.5 mg each bedtime as this helped her sleep. She'll continue Lexapro 10 mg every morning for depression   She will continue Xanax 0.25 mg 2 times a day  for anxiety And gabapentin 100 mg 3 times a day for anxiety and neuropathic pain   Routine PRN Medications:  No  Consultations:   Safety Concerns:  She denies any thoughts or plans to hurt self or others   Other:  She'll return in 3 months     [name], MD 4/12/201710:28 AM

## 2015-10-12 NOTE — Telephone Encounter (Signed)
Patient refused to sign Appointment Responsibility 

## 2015-10-17 DIAGNOSIS — R634 Abnormal weight loss: Secondary | ICD-10-CM | POA: Diagnosis not present

## 2015-10-17 DIAGNOSIS — M545 Low back pain: Secondary | ICD-10-CM | POA: Diagnosis not present

## 2015-10-17 DIAGNOSIS — I10 Essential (primary) hypertension: Secondary | ICD-10-CM | POA: Diagnosis not present

## 2015-10-31 ENCOUNTER — Other Ambulatory Visit: Payer: Self-pay | Admitting: Internal Medicine

## 2015-10-31 DIAGNOSIS — M545 Low back pain, unspecified: Secondary | ICD-10-CM

## 2015-10-31 DIAGNOSIS — G8929 Other chronic pain: Secondary | ICD-10-CM

## 2015-11-01 ENCOUNTER — Ambulatory Visit: Payer: Self-pay | Admitting: Orthopedic Surgery

## 2015-11-09 ENCOUNTER — Ambulatory Visit
Admission: RE | Admit: 2015-11-09 | Discharge: 2015-11-09 | Disposition: A | Discharge status: Home / Self Care | Payer: Medicare Other | Source: Ambulatory Visit | Attending: Internal Medicine | Admitting: Internal Medicine

## 2015-11-09 DIAGNOSIS — M545 Low back pain, unspecified: Secondary | ICD-10-CM

## 2015-11-09 DIAGNOSIS — M5126 Other intervertebral disc displacement, lumbar region: Secondary | ICD-10-CM | POA: Diagnosis not present

## 2015-11-09 DIAGNOSIS — G8929 Other chronic pain: Secondary | ICD-10-CM

## 2015-11-09 MED ORDER — IOPAMIDOL (ISOVUE-M 200) INJECTION 41%
1.0000 mL | Freq: Once | INTRAMUSCULAR | Status: AC
  Administered 2015-11-09: 1 mL via EPIDURAL

## 2015-11-09 MED ORDER — METHYLPREDNISOLONE ACETATE 40 MG/ML INJ SUSP (RADIOLOG
120.0000 mg | Freq: Once | INTRAMUSCULAR | Status: AC
  Administered 2015-11-09: 120 mg via EPIDURAL

## 2015-11-09 NOTE — Discharge Instructions (Signed)

## 2015-11-14 DIAGNOSIS — E782 Mixed hyperlipidemia: Secondary | ICD-10-CM | POA: Diagnosis not present

## 2015-11-14 DIAGNOSIS — E1165 Type 2 diabetes mellitus with hyperglycemia: Secondary | ICD-10-CM | POA: Diagnosis not present

## 2015-11-16 DIAGNOSIS — I10 Essential (primary) hypertension: Secondary | ICD-10-CM | POA: Diagnosis not present

## 2015-11-16 DIAGNOSIS — E782 Mixed hyperlipidemia: Secondary | ICD-10-CM | POA: Diagnosis not present

## 2015-11-16 DIAGNOSIS — N182 Chronic kidney disease, stage 2 (mild): Secondary | ICD-10-CM | POA: Diagnosis not present

## 2015-11-16 DIAGNOSIS — E1122 Type 2 diabetes mellitus with diabetic chronic kidney disease: Secondary | ICD-10-CM | POA: Diagnosis not present

## 2015-12-09 ENCOUNTER — Encounter (HOSPITAL_COMMUNITY): Payer: Self-pay | Admitting: Psychiatry

## 2015-12-09 ENCOUNTER — Ambulatory Visit (INDEPENDENT_AMBULATORY_CARE_PROVIDER_SITE_OTHER): Payer: Commercial Managed Care - HMO | Admitting: Psychiatry

## 2015-12-09 VITALS — BP 135/73 | HR 60 | Ht 64.0 in | Wt 177.0 lb

## 2015-12-09 DIAGNOSIS — F321 Major depressive disorder, single episode, moderate: Secondary | ICD-10-CM

## 2015-12-09 MED ORDER — ESCITALOPRAM OXALATE 10 MG PO TABS: 10.0000 mg | ORAL_TABLET | Freq: Every day | ORAL | Status: DC

## 2015-12-09 MED ORDER — ALPRAZOLAM 0.25 MG PO TABS: 0.2500 mg | ORAL_TABLET | Freq: Two times a day (BID) | ORAL | Status: DC

## 2015-12-09 MED ORDER — MIRTAZAPINE 7.5 MG PO TABS: 7.5000 mg | ORAL_TABLET | Freq: Every day | ORAL | Status: DC

## 2015-12-09 MED ORDER — GABAPENTIN 100 MG PO CAPS: 100.0000 mg | ORAL_CAPSULE | Freq: Three times a day (TID) | ORAL | Status: DC

## 2015-12-09 NOTE — Progress Notes (Signed)
Patient ID: Audrey Johnson, female   DOB: 1940/12/25, 75 y.o.   MRN: [id number] Patient ID: Audrey Johnson, female   DOB: 13-Aug-1940, 75 y.o.   MRN: [id number] Patient ID: Audrey Johnson, female   DOB: 1940-11-27, 75 y.o.   MRN: [id number] Patient ID: Audrey Johnson, female   DOB: [birth year], 75 y.o.   MRN: [id number] Patient ID: Audrey Johnson, female   DOB: Jun 23, 1941, 75 y.o.   MRN: [id number] Patient ID: Audrey Johnson, female   DOB: [birth year], 75 y.o.   MRN: [id number] Patient ID: Audrey Johnson, female   DOB: Feb 06, 1941, 75 y.o.   MRN: [id number] Patient ID: Audrey Johnson, female   DOB: 04-03-1941, 75 y.o.   MRN: [id number] Patient ID: Audrey Johnson, female   DOB: 05/17/1941, 75 y.o.   MRN: [id number] Patient ID: Audrey Johnson, female   DOB: 1941-03-11, 75 y.o.   MRN: [id number] Patient ID: Audrey Johnson, female   DOB: April 06, 1941, 75 y.o.   MRN: [id number] Patient ID: Audrey Johnson, female   DOB: [birth year], 75 y.o.   MRN: [id number] Patient ID: Audrey Johnson, female   DOB: 1941-06-08, 75 y.o.   MRN: [id number] Patient ID: LILIETH BLUME, female   DOB: [birth year], 75 y.o.   MRN: [id number]  Psychiatric Assessment Adult  Patient Identification:  LAKEYSHA LAFAZIA Date of Evaluation:  12/09/2015 Chief Complaint: I feel dizzy during the day History of Chief Complaint:   Chief Complaint  Patient presents with  . Depression  . Anxiety  . Follow-up    Depression        Associated symptoms include appetite change.  Past medical history includes anxiety.   Anxiety    Altered Mental Status Associated symptoms include joint swelling.   this patient is a 75 year old widowed black female who lives alonein [place].The patient used to work in a [employer] but has been retired for several years.  The patient was referred by Dr. Nevada Crane, her primary Dr. Apparently she's not had any history of mental health symptoms until about 2 months  ago. She started to have severe stomach pain and has gone to several emergency room this in the local area. She's had 9 emergency room visits in the last couple of months. Her last one was yesterday. She's had 3 abdominal CTs in the last month, and ultrasound and blood work. Nothing significantly wrong has been found.  The patient is on numerous medications for her stomach but they don't seem to help. She was prescribed Lexapro by her primary doctor but she refused to take it.  she is taking a low dose of Xanax  She returns after 3 months. She is Audrey Johnson walking more easily today and states that she had an injection in her back and is helped a lot. Her mood has been good and she denies any symptoms of anxiety or depression. She staying busy working around her house and in the garden. She denies any stomach pain. Her primary physician took her off Megace and now she is losing some weight but her actual weight has stayed stable over the past year. Her BMI is 30 so she is definitely not underweight. She thinks her medications have been very helpful and in fact would like to only come back every 6 months due to cost Review of Systems  Constitutional: Positive for appetite change and unexpected weight change.  Musculoskeletal: Positive for back pain and joint swelling.  Psychiatric/Behavioral: Positive for depression.  Physical Exam not done  Depressive Symptoms: depressed mood, anhedonia, insomnia, psychomotor agitation, difficulty concentrating, hopelessness, suicidal thoughts with specific plan, anxiety, loss of energy/fatigue, weight loss,  (Hypo) Manic Symptoms:   Elevated Mood:  No Irritable Mood:  Yes Grandiosity:  No Distractibility:  No Labiality of Mood:  Yes Delusions:  Yes Hallucinations:  Yes Impulsivity:  No Sexually Inappropriate Behavior:  No Financial Extravagance:  No Flight of Ideas:  No  Anxiety Symptoms: Excessive Worry:  Yes Panic Symptoms:  No Agoraphobia:   No Obsessive Compulsive: No  Symptoms: None, Specific Phobias:  No Social Anxiety:  No  Psychotic Symptoms:  Hallucinations: Yes Visual Delusions:  Yes Paranoia:  No   Ideas of Reference:  No  PTSD Symptoms: Ever had a traumatic exposure:  No Had a traumatic exposure in the last month:  No Re-experiencing: No None Hypervigilance:  No Hyperarousal: No None Avoidance: No None  Traumatic Brain Injury: No   Past Psychiatric History: Diagnosis: None   Hospitalizations: None   Outpatient Care: None   Substance Abuse Care: None   Self-Mutilation: None   Suicidal Attempts: None   Violent Behaviors: None    Past Medical History:   Past Medical History  Diagnosis Date  . Hypertension   . High cholesterol   . Chronic back pain   . Anxiety   . Shortness of breath      even walking causes sob...   . Arthritis     in back .   Marland Kitchen Pneumonia   . Borderline diabetes   . Chronic abdominal pain   . Chronic constipation   . Chronic nausea   . Noncompliance with medication regimen   . GERD (gastroesophageal reflux disease)   . Poor historian    History of Loss of Consciousness:  No Seizure History:  No Cardiac History:  No Allergies:  No Known Allergies Current Medications:  Current Outpatient Prescriptions  Medication Sig Dispense Refill  . ALPRAZolam (XANAX) 0.25 MG tablet Take 1 tablet (0.25 mg total) by mouth 2 (two) times daily. 60 tablet 5  . escitalopram (LEXAPRO) 10 MG tablet Take 1 tablet (10 mg total) by mouth daily. 90 tablet 2  . gabapentin (NEURONTIN) 100 MG capsule Take 1 capsule (100 mg total) by mouth 3 (three) times daily. 90 capsule 2  . mirtazapine (REMERON) 7.5 MG tablet Take 1 tablet (7.5 mg total) by mouth at bedtime. 90 tablet 2  . nebivolol (BYSTOLIC) 5 MG tablet Take 5 mg by mouth daily.    Marland Kitchen omeprazole (PRILOSEC) 20 MG capsule TAKE 2 CAPSULES BY MOUTH TWICE A DAY BEFORE MEALS 180 capsule 3  . PROAIR HFA 108 (90 BASE) MCG/ACT inhaler Inhale 1-2 puffs  into the lungs every 4 (four) hours as needed for wheezing or shortness of breath.     . megestrol (MEGACE) 40 MG/ML suspension Reported on 12/09/2015     No current facility-administered medications for this visit.    Previous Psychotropic Medications:  Medication Dose   Xanax   0.25 mg every 8 hours as needed                      Substance Abuse History in the last 12 months: Substance Age of 1st Use Last Use Amount Specific Type  Nicotine      Alcohol      Cannabis      Opiates      Cocaine      Methamphetamines  LSD      Ecstasy      Benzodiazepines      Caffeine      Inhalants      Others:                          Medical Consequences of Substance Abuse: n/a  Legal Consequences of Substance Abuse: n/a  Family Consequences of Substance Abuse: n/a  Blackouts:  No DT's:  No Withdrawal Symptoms:  No None  Social History: Current Place of Residence: [place] of Birth: Unknown Family Members: Lives with son Marital Status:  Widowed Children:   Sons: 1  Daughters: 1 Relationships:  Education:  [employer] Problems/Performance:  Religious Beliefs/Practices: Unknown History of Abuse: none Pensions consultant; Manufacturing engineer History:  None. Legal History: None Hobbies/Interests: Church  Family History:   Family History  Problem Relation Age of Onset  . Colon cancer Neg Hx     Mental Status Examination/Evaluation: Objective:  Appearance:   She is neatly dressed, pleasant,Walking with a limp   Eye Contact::  Good   Speech:  Normal   Volume:  Decreased  Mood: Good  Affect:  Euthymic   Thought Process:  Organized   Orientation:  Full (Time, Place, and Person)  Thought Content:  No delusions or hallucinations   Suicidal Thoughts:  No  Homicidal Thoughts:  No  Judgement:  Impaired  Insight:  Lacking  Psychomotor Activity:  Normal   Akathisia:  No  Handed:  Right  AIMS (if indicated):     Assets:  Social Support  Fund of knowledge memory and language are all good as well  Laboratory/X-Ray Psychological Evaluation(s)        Assessment:  Maj. depression, single episode with psychotic features and somatization disorder  AXIS I Major Depression, single episode with psychotic features, and somatization disorder   AXIS II Deferred  AXIS III Past Medical History  Diagnosis Date  . Hypertension   . High cholesterol   . Chronic back pain   . Anxiety   . Shortness of breath      even walking causes sob...   . Arthritis     in back .   Marland Kitchen Pneumonia   . Borderline diabetes   . Chronic abdominal pain   . Chronic constipation   . Chronic nausea   . Noncompliance with medication regimen   . GERD (gastroesophageal reflux disease)   . Poor historian      AXIS IV other psychosocial or environmental problems  AXIS V 11-20 some danger of hurting self or others possible OR occasionally fails to maintain minimal personal hygiene OR gross impairment in communication   Treatment Plan/Recommendations:  Plan of Care: Medication management   Laboratory:    Psychotherapy: She is scheduled to see Maurice Small   Medications: The patient will continue mirtazapine 7.5 mg each bedtime as this helped her sleep. She'll continue Lexapro 10 mg every morning for depression   She will continue Xanax 0.25 mg 2 times a day  for anxiety And gabapentin 100 mg 3 times a day for anxiety and neuropathic pain   Routine PRN Medications:  No  Consultations:   Safety Concerns:  She denies any thoughts or plans to hurt self or others   Other:  She'll return in 6 months     [name], MD 6/9/201710:16 AM

## 2015-12-12 DIAGNOSIS — N182 Chronic kidney disease, stage 2 (mild): Secondary | ICD-10-CM | POA: Diagnosis not present

## 2015-12-12 DIAGNOSIS — I10 Essential (primary) hypertension: Secondary | ICD-10-CM | POA: Diagnosis not present

## 2016-01-09 DIAGNOSIS — R079 Chest pain, unspecified: Secondary | ICD-10-CM | POA: Diagnosis not present

## 2016-01-09 DIAGNOSIS — L720 Epidermal cyst: Secondary | ICD-10-CM | POA: Diagnosis not present

## 2016-01-09 DIAGNOSIS — N182 Chronic kidney disease, stage 2 (mild): Secondary | ICD-10-CM | POA: Diagnosis not present

## 2016-01-09 DIAGNOSIS — I10 Essential (primary) hypertension: Secondary | ICD-10-CM | POA: Diagnosis not present

## 2016-01-21 ENCOUNTER — Encounter (HOSPITAL_COMMUNITY): Payer: Self-pay

## 2016-01-21 ENCOUNTER — Emergency Department (HOSPITAL_COMMUNITY): Payer: Commercial Managed Care - HMO

## 2016-01-21 ENCOUNTER — Emergency Department (HOSPITAL_COMMUNITY)
Admission: EM | Admit: 2016-01-21 | Discharge: 2016-01-21 | Disposition: A | Discharge status: Home / Self Care | Payer: Commercial Managed Care - HMO | Attending: Emergency Medicine | Admitting: Emergency Medicine

## 2016-01-21 DIAGNOSIS — M25531 Pain in right wrist: Secondary | ICD-10-CM | POA: Diagnosis not present

## 2016-01-21 DIAGNOSIS — Z79899 Other long term (current) drug therapy: Secondary | ICD-10-CM | POA: Insufficient documentation

## 2016-01-21 DIAGNOSIS — M25522 Pain in left elbow: Secondary | ICD-10-CM | POA: Insufficient documentation

## 2016-01-21 DIAGNOSIS — S6991XA Unspecified injury of right wrist, hand and finger(s), initial encounter: Secondary | ICD-10-CM | POA: Diagnosis not present

## 2016-01-21 DIAGNOSIS — Y999 Unspecified external cause status: Secondary | ICD-10-CM | POA: Insufficient documentation

## 2016-01-21 DIAGNOSIS — M545 Low back pain, unspecified: Secondary | ICD-10-CM

## 2016-01-21 DIAGNOSIS — M25532 Pain in left wrist: Secondary | ICD-10-CM | POA: Insufficient documentation

## 2016-01-21 DIAGNOSIS — M1000 Idiopathic gout, unspecified site: Secondary | ICD-10-CM

## 2016-01-21 DIAGNOSIS — M25521 Pain in right elbow: Secondary | ICD-10-CM | POA: Diagnosis not present

## 2016-01-21 DIAGNOSIS — Y92009 Unspecified place in unspecified non-institutional (private) residence as the place of occurrence of the external cause: Secondary | ICD-10-CM | POA: Diagnosis not present

## 2016-01-21 DIAGNOSIS — M25561 Pain in right knee: Secondary | ICD-10-CM | POA: Diagnosis not present

## 2016-01-21 DIAGNOSIS — I10 Essential (primary) hypertension: Secondary | ICD-10-CM | POA: Insufficient documentation

## 2016-01-21 DIAGNOSIS — M25562 Pain in left knee: Secondary | ICD-10-CM | POA: Diagnosis not present

## 2016-01-21 DIAGNOSIS — M533 Sacrococcygeal disorders, not elsewhere classified: Secondary | ICD-10-CM | POA: Diagnosis not present

## 2016-01-21 DIAGNOSIS — W19XXXA Unspecified fall, initial encounter: Secondary | ICD-10-CM

## 2016-01-21 DIAGNOSIS — Y939 Activity, unspecified: Secondary | ICD-10-CM | POA: Insufficient documentation

## 2016-01-21 DIAGNOSIS — W07XXXA Fall from chair, initial encounter: Secondary | ICD-10-CM | POA: Diagnosis not present

## 2016-01-21 DIAGNOSIS — M4690 Unspecified inflammatory spondylopathy, site unspecified: Secondary | ICD-10-CM | POA: Insufficient documentation

## 2016-01-21 DIAGNOSIS — S3992XA Unspecified injury of lower back, initial encounter: Secondary | ICD-10-CM | POA: Diagnosis not present

## 2016-01-21 MED ORDER — DEXAMETHASONE SODIUM PHOSPHATE 10 MG/ML IJ SOLN
10.0000 mg | Freq: Once | INTRAMUSCULAR | Status: AC
  Administered 2016-01-21: 10 mg via INTRAMUSCULAR
  Filled 2016-01-21: qty 1

## 2016-01-21 MED ORDER — PREDNISONE 20 MG PO TABS: ORAL_TABLET | ORAL | Status: DC

## 2016-01-21 MED ORDER — COLCHICINE 0.6 MG PO TABS
0.6000 mg | ORAL_TABLET | Freq: Once | ORAL | Status: AC
  Administered 2016-01-21: 0.6 mg via ORAL
  Filled 2016-01-21: qty 1

## 2016-01-21 MED ORDER — METHOCARBAMOL 500 MG PO TABS: ORAL_TABLET | ORAL | Status: DC

## 2016-01-21 MED ORDER — COLCHICINE 0.6 MG PO TABS: 0.6000 mg | ORAL_TABLET | Freq: Every day | ORAL | Status: DC

## 2016-01-21 NOTE — ED Notes (Signed)
Pt states she fell 2 weeks ago, having "pain all over"  But worse in her back

## 2016-01-21 NOTE — ED Provider Notes (Signed)
CSN: [id number]     Arrival date & time 01/21/16  0529 History   First MD Initiated Contact with Patient 01/21/16 05:40 AM   Chief Complaint  Patient presents with  . Fall     (Consider location/radiation/quality/duration/timing/severity/associated sxs/prior Treatment) HPI patient states she was at her daughter's house 2 weeks ago when she got up out of the chair the chair tipped over with her and she fell. She is not sure how she landed. Since that fall she's had pain in her knees, wrists, elbows, hips and her back. She states this morning she's been getting worsening pain. She states she has been sore all over since she fell. She took Tylenol without relief. Patient also states her tailbone hurts when she sits. She states trying to move makes the pain worse. She denies any urinary or rectal incontinence. She also starts talking about both her hips hurting however she also states Dr. Nevada Crane told her she had bursitis. This was present before she fell. She states she has a history of gout in the past.She also states she's had borderline diabetes.   PCP Dr Nevada Crane, has appt on 8/7  Past Medical History  Diagnosis Date  . Hypertension   . High cholesterol   . Chronic back pain   . Anxiety   . Shortness of breath      even walking causes sob...   . Arthritis     in back .   Marland Kitchen Pneumonia   . Borderline diabetes   . Chronic abdominal pain   . Chronic constipation   . Chronic nausea   . Noncompliance with medication regimen   . GERD (gastroesophageal reflux disease)   . Poor historian    Past Surgical History  Procedure Laterality Date  . Abdominal hysterectomy    . Toe surgery Right     5th toe, not sure what was wrong with it  . Dilation and curettage of uterus    . Cardiac catheterization    . Colonoscopy  04/16/2007    RMR: Normal rectum and colon  . Colonoscopy with esophagogastroduodenoscopy (egd) N/A 01/08/2013    [id number] erosive reflux esophagitis. Negative H.pylori. Small  hiatal hernia-s/p bx (gastritis)-TCS/Colonic polyp-hyperplastic  . Left heart catheterization with coronary angiogram N/A 06/05/2012    Procedure: LEFT HEART CATHETERIZATION WITH CORONARY ANGIOGRAM;  Surgeon: Hillary Bow, MD;  Location: [facility] CATH LAB;  Service: Cardiovascular;  Laterality: N/A;  . Breast surgery  unknown (many yrs)    excision  of lump right breast  was per pt "nothing"   . Cataract extraction w/phaco Left 02/21/2015    Procedure: CATARACT EXTRACTION PHACO AND INTRAOCULAR LENS PLACEMENT LEFT EYE CDE=5.86;  Surgeon: Tonny Branch, MD;  Location: [facility] ORS;  Service: Ophthalmology;  Laterality: Left;  . Cataract extraction w/phaco Right 03/14/2015    Procedure: CATARACT EXTRACTION PHACO AND INTRAOCULAR LENS PLACEMENT (IOC);  Surgeon: Tonny Branch, MD;  Location: [facility] ORS;  Service: Ophthalmology;  Laterality: Right;  CDE:6.36   Family History  Problem Relation Age of Onset  . Colon cancer Neg Hx    Social History  Substance Use Topics  . Smoking status: Never Smoker   . Smokeless tobacco: None     Comment: Never smoked  . Alcohol Use: No   Lives at home Lives alone Uses a cane  OB History    No data available     Review of Systems  All other systems reviewed and are negative.     Allergies  Review  of patient's allergies indicates no known allergies.  Home Medications   Prior to Admission medications   Medication Sig Start Date End Date Taking? Authorizing Provider  ALPRAZolam (XANAX) 0.25 MG tablet Take 1 tablet (0.25 mg total) by mouth 2 (two) times daily. 12/09/15   Cloria Spring, MD  colchicine 0.6 MG tablet Take 1 tablet (0.6 mg total) by mouth daily. 01/21/16   Rolland Porter, MD  escitalopram (LEXAPRO) 10 MG tablet Take 1 tablet (10 mg total) by mouth daily. 12/09/15   Cloria Spring, MD  gabapentin (NEURONTIN) 100 MG capsule Take 1 capsule (100 mg total) by mouth 3 (three) times daily. 12/09/15 12/08/16  Cloria Spring, MD  megestrol (MEGACE) 40 MG/ML suspension Reported  on 12/09/2015 07/08/15   Historical Provider, MD  methocarbamol (ROBAXIN) 500 MG tablet Take 1 po QID prn sore muscles 01/21/16   Rolland Porter, MD  mirtazapine (REMERON) 7.5 MG tablet Take 1 tablet (7.5 mg total) by mouth at bedtime. 12/09/15   Cloria Spring, MD  nebivolol (BYSTOLIC) 5 MG tablet Take 5 mg by mouth daily.    Historical Provider, MD  omeprazole (PRILOSEC) 20 MG capsule TAKE 2 CAPSULES BY MOUTH TWICE A DAY BEFORE MEALS 02/28/15   Orvil Feil, NP  predniSONE (DELTASONE) 20 MG tablet Take 2 po QD x 4d then 1 po QD x 4d 01/21/16   Rolland Porter, MD  PROAIR HFA 108 (90 BASE) MCG/ACT inhaler Inhale 1-2 puffs into the lungs every 4 (four) hours as needed for wheezing or shortness of breath.  06/17/14   Historical Provider, MD   BP 128/61 mmHg  Pulse 74  Temp(Src) 98.4 F (36.9 C) (Oral)  Resp 18  Ht 5\' 4"  (1.626 m)  Wt 165 lb (74.844 kg)  BMI 28.31 kg/m2  SpO2 100%  Vital signs normal   Physical Exam  Constitutional: She is oriented to person, place, and time. She appears well-developed and well-nourished.  Non-toxic appearance. She does not appear ill. She appears distressed.  Moaning  HENT:  Head: Normocephalic and atraumatic.  Right Ear: External ear normal.  Left Ear: External ear normal.  Nose: Nose normal. No mucosal edema or rhinorrhea.  Mouth/Throat: Oropharynx is clear and moist and mucous membranes are normal. No dental abscesses or uvula swelling.  Eyes: Conjunctivae and EOM are normal. Pupils are equal, round, and reactive to light.  Neck: Normal range of motion and full passive range of motion without pain. Neck supple.  Cardiovascular: Normal rate, regular rhythm and normal heart sounds.  Exam reveals no gallop and no friction rub.   No murmur heard. Pulmonary/Chest: Effort normal and breath sounds normal. No respiratory distress. She has no wheezes. She has no rhonchi. She has no rales. She exhibits no tenderness and no crepitus.  Abdominal: Soft. Normal appearance and bowel  sounds are normal. She exhibits no distension. There is no tenderness. There is no rebound and no guarding.  Musculoskeletal: Normal range of motion. She exhibits no edema or tenderness.       Back:  Patient has diffuse pain of her lumbar spine and paraspinous muscles that reproduces her complaints of pain. Patient may have some mild swelling of her right wrist however she has good range of motion of her wrist. She is right-handed. She also states it hurts diffusely in her left wrist. Both of her knees are painful without obvious joint effusion. There may be some soft tissue swelling present. There is no redness around any of her  joints or increased warmth.  Neurological: She is alert and oriented to person, place, and time. She has normal strength. No cranial nerve deficit.  Skin: Skin is warm, dry and intact. No rash noted. No erythema. No pallor.  Psychiatric: She has a normal mood and affect. Her speech is normal and behavior is normal. Her mood appears not anxious.  Nursing note and vitals reviewed.   ED Course  Procedures (including critical care time)  Medications  colchicine tablet 0.6 mg (0.6 mg Oral Given 01/21/16 0625)  dexamethasone (DECADRON) injection 10 mg (10 mg Intramuscular Given 01/21/16 0624)    Review of patient's prior blood work shows she has a Mild renal insufficiency. She was given Decadron for pain and a dose of colchicine for possible gout. I x-rayed the things that hurt the most which was her low back and her right wrist and her tailbone.  Recheck at 7:10 AM patient is resting peacefully, she states her pain is improved. We discussed her x-ray results. We discussed washing her diet or she is on the steroids, she was also given a low purine diet to look at. She was discharged home on prednisone, colchicine, and Robaxin for her muscle soreness. She can use heat for comfort.   Review of the [place] shows patient gets #60 alprazolam 0.25 mg monthly from  her psychiatrist, Dr. Harrington Challenger   Imaging Review Dg Lumbar Spine Complete  01/21/2016  CLINICAL DATA:  Fall 2 weeks ago. Lower back pain radiating down both legs. Initial encounter. EXAM: LUMBAR SPINE - COMPLETE 4+ VIEW COMPARISON:  04/16/2013 FINDINGS: No evidence of acute fracture or traumatic malalignment. Chronic L2-3 retrolisthesis and slight L4-5 anterolisthesis. Spondylosis and disc degeneration. Degenerative disc narrowing and spurring is focally advanced at L1-2, with asymmetric left disc narrowing causing mild dextroscoliosis. Osteopenia. Atherosclerosis. IMPRESSION: 1. No acute finding. 2. Disc degeneration focally advanced at L1-2, with mild scoliosis. 3.  Aortic Atherosclerosis (ICD10-170.0) Electronically Signed   By: Monte Fantasia M.D.   On: 01/21/2016 06:47   Dg Sacrum/coccyx  01/21/2016  CLINICAL DATA:  Fall 2 weeks ago with back pain radiating down both legs. Initial encounter. EXAM: SACRUM AND COCCYX - 2+ VIEW COMPARISON:  None. FINDINGS: No evidence of fracture or diastasis. No notable sacroiliac degenerative change. IMPRESSION: Negative. Electronically Signed   By: Monte Fantasia M.D.   On: 01/21/2016 06:49   Dg Wrist Complete Right  01/21/2016  CLINICAL DATA:  Fall 2 weeks ago. Right wrist pain. Initial encounter. EXAM: RIGHT WRIST - COMPLETE 3+ VIEW COMPARISON:  None. FINDINGS: There is no evidence of fracture or dislocation. Wrist chondrocalcinosis without erosive change or notable joint narrowing. IMPRESSION: No acute finding. Electronically Signed   By: Monte Fantasia M.D.   On: 01/21/2016 06:50   I have personally reviewed and evaluated these images and lab results as part of my medical decision-making.    MDM   Final diagnoses:  Fall at home, initial encounter  Pain of both elbows  Pain in both wrists  Pain in both knees  Midline low back pain without sciatica  Coccygeal pain  Acute idiopathic gout, unspecified site    New Prescriptions   COLCHICINE 0.6 MG  TABLET    Take 1 tablet (0.6 mg total) by mouth daily.   METHOCARBAMOL (ROBAXIN) 500 MG TABLET    Take 1 po QID prn sore muscles   PREDNISONE (DELTASONE) 20 MG TABLET    Take 2 po QD x 4d then 1 po QD x  4d    Plan discharge  Rolland Porter, MD, Barbette Or, MD 01/21/16 [phone]

## 2016-01-21 NOTE — Discharge Instructions (Signed)
Take the medications as prescribed. Be careful with your diet while you are on the prednisone, it can make your blood sugar get high. Keep your appointment with Dr Nevada Crane in August. Look at the low purine diet to avoid things that can make gout worse.     Gout Gout is when your joints become red, sore, and swell (inflamed). This is caused by the buildup of uric acid crystals in the joints. Uric acid is a chemical that is normally in the blood. If the level of uric acid gets too high in the blood, these crystals form in your joints and tissues. Over time, these crystals can form into masses near the joints and tissues. These masses can destroy bone and cause the bone to look misshapen (deformed). HOME CARE   Do not take aspirin for pain.  Only take medicine as told by your doctor.  Rest the joint as much as you can. When in bed, keep sheets and blankets off painful areas.  Keep the sore joints raised (elevated).  Put warm or cold packs on painful joints. Use of warm or cold packs depends on which works best for you.  Use crutches if the painful joint is in your leg.  Drink enough fluids to keep your pee (urine) clear or pale yellow. Limit alcohol, sugary drinks, and drinks with fructose in them.  Follow your diet instructions. Pay careful attention to how much protein you eat. Include fruits, vegetables, whole grains, and fat-free or low-fat milk products in your daily diet. Talk to your doctor or dietitian about the use of coffee, vitamin C, and cherries. These may help lower uric acid levels.  Keep a healthy body weight. GET HELP RIGHT AWAY IF:   You have watery poop (diarrhea), throw up (vomit), or have any side effects from medicines.  You do not feel better in 24 hours, or you are getting worse.  Your joint becomes suddenly more tender, and you have chills or a fever. MAKE SURE YOU:   Understand these instructions.  Will watch your condition.  Will get help right away if you  are not doing well or get worse.   This information is not intended to replace advice given to you by your health care provider. Make sure you discuss any questions you have with your health care provider.   Document Released: 03/27/2008 Document Revised: 07/09/2014 Document Reviewed: 01/30/2012 Elsevier Interactive Patient Education 2016 [place] therapy can help ease sore, stiff, injured, and tight muscles and joints. Heat relaxes your muscles, which may help ease your pain. Heat therapy should only be used on old, pre-existing, or long-lasting (chronic) injuries. Do not use heat therapy unless told by your doctor. HOW TO USE HEAT THERAPY There are several different kinds of heat therapy, including:  Moist heat pack.  Warm water bath.  Hot water bottle.  Electric heating pad.  Heated gel pack.  Heated wrap.  Electric heating pad. GENERAL HEAT THERAPY RECOMMENDATIONS   Do not sleep while using heat therapy. Only use heat therapy while you are awake.  Your skin may turn pink while using heat therapy. Do not use heat therapy if your skin turns red.  Do not use heat therapy if you have new pain.  High heat or long exposure to heat can cause burns. Be careful when using heat therapy to avoid burning your skin.  Do not use heat therapy on areas of your skin that are already irritated, such as with  a rash or sunburn. GET HELP IF:   You have blisters, redness, swelling (puffiness), or numbness.  You have new pain.  Your pain is worse. MAKE SURE YOU:  Understand these instructions.  Will watch your condition.  Will get help right away if you are not doing well or get worse.   This information is not intended to replace advice given to you by your health care provider. Make sure you discuss any questions you have with your health care provider.   Document Released: 09/10/2011 Document Revised: 07/09/2014 Document Reviewed: 08/11/2013 Elsevier  Interactive Patient Education 2016 [place] are compounds that affect the level of uric acid in your body. A low-purine diet is a diet that is low in purines. Eating a low-purine diet can prevent the level of uric acid in your body from getting too high and causing gout or kidney stones or both. WHAT DO I NEED TO KNOW ABOUT THIS DIET?  Choose low-purine foods. Examples of low-purine foods are listed in the next section.  Drink plenty of fluids, especially water. Fluids can help remove uric acid from your body. Try to drink 8-16 cups (1.9-3.8 L) a day.  Limit foods high in fat, especially saturated fat, as fat makes it harder for the body to get rid of uric acid. Foods high in saturated fat include pizza, cheese, ice cream, whole milk, fried foods, and gravies. Choose foods that are lower in fat and lean sources of protein. Use olive oil when cooking as it contains healthy fats that are not high in saturated fat.  Limit alcohol. Alcohol interferes with the elimination of uric acid from your body. If you are having a gout attack, avoid all alcohol.  Keep in mind that different people's bodies react differently to different foods. You will probably learn over time which foods do or do not affect you. If you discover that a food tends to cause your gout to flare up, avoid eating that food. You can more freely enjoy foods that do not cause problems. If you have any questions about a food item, talk to your dietitian or health care provider. WHICH FOODS ARE LOW, MODERATE, AND HIGH IN PURINES? The following is a list of foods that are low, moderate, and high in purines. You can eat any amount of the foods that are low in purines. You may be able to have small amounts of foods that are moderate in purines. Ask your health care provider how much of a food moderate in purines you can have. Avoid foods high in purines. Grains  Foods low in purines: Enriched white bread, pasta,  rice, cake, cornbread, popcorn.  Foods moderate in purines: Whole-grain breads and cereals, wheat germ, bran, oatmeal. Uncooked oatmeal. Dry wheat bran or wheat germ.  Foods high in purines: Pancakes, [place] toast, biscuits, muffins. Vegetables  Foods low in purines: All vegetables, except those that are moderate in purines.  Foods moderate in purines: Asparagus, cauliflower, spinach, mushrooms, green peas. Fruits  All fruits are low in purines. Meats and other Protein Foods  Foods low in purines: Eggs, nuts, peanut butter.  Foods moderate in purines: 80-90% lean beef, lamb, veal, pork, poultry, fish, eggs, peanut butter, nuts. Crab, lobster, oysters, and shrimp. Cooked dried beans, peas, and lentils.  Foods high in purines: Anchovies, sardines, herring, mussels, tuna, codfish, scallops, trout, and haddock. Berniece Salines. Organ meats (such as liver or kidney). Tripe. Game meat. Goose. Sweetbreads. Dairy  All dairy foods are low  in purines. Low-fat and fat-free dairy products are best because they are low in saturated fat. Beverages  Drinks low in purines: Water, carbonated beverages, tea, coffee, cocoa.  Drinks moderate in purines: Soft drinks and other drinks sweetened with high-fructose corn syrup. Juices. To find whether a food or drink is sweetened with high-fructose corn syrup, look at the ingredients list.  Drinks high in purines: Alcoholic beverages (such as beer). Condiments  Foods low in purines: Salt, herbs, olives, pickles, relishes, vinegar.  Foods moderate in purines: Butter, margarine, oils, mayonnaise. Fats and Oils  Foods low in purines: All types, except gravies and sauces made with meat.  Foods high in purines: Gravies and sauces made with meat. Other Foods  Foods low in purines: Sugars, sweets, gelatin. Cake. Soups made without meat.  Foods moderate in purines: Meat-based or fish-based soups, broths, or bouillons. Foods and drinks sweetened with high-fructose  corn syrup.  Foods high in purines: High-fat desserts (such as ice cream, cookies, cakes, pies, doughnuts, and chocolate). Contact your dietitian for more information on foods that are not listed here.   This information is not intended to replace advice given to you by your health care provider. Make sure you discuss any questions you have with your health care provider.   Document Released: 10/13/2010 Document Revised: 06/23/2013 Document Reviewed: 05/25/2013 Elsevier Interactive Patient Education [employer].

## 2016-02-03 ENCOUNTER — Encounter: Payer: Self-pay | Admitting: Internal Medicine

## 2016-02-06 DIAGNOSIS — E782 Mixed hyperlipidemia: Secondary | ICD-10-CM | POA: Diagnosis not present

## 2016-02-06 DIAGNOSIS — I10 Essential (primary) hypertension: Secondary | ICD-10-CM | POA: Diagnosis not present

## 2016-02-06 DIAGNOSIS — N182 Chronic kidney disease, stage 2 (mild): Secondary | ICD-10-CM | POA: Diagnosis not present

## 2016-02-06 DIAGNOSIS — K219 Gastro-esophageal reflux disease without esophagitis: Secondary | ICD-10-CM | POA: Diagnosis not present

## 2016-02-21 ENCOUNTER — Telehealth (HOSPITAL_COMMUNITY): Payer: Self-pay | Admitting: *Deleted

## 2016-02-21 ENCOUNTER — Other Ambulatory Visit (HOSPITAL_COMMUNITY): Payer: Self-pay | Admitting: Psychiatry

## 2016-02-21 MED ORDER — GABAPENTIN 100 MG PO CAPS: 100.0000 mg | ORAL_CAPSULE | Freq: Three times a day (TID) | ORAL | 2 refills | Status: DC

## 2016-02-21 NOTE — Telephone Encounter (Signed)
done

## 2016-02-21 NOTE — Telephone Encounter (Signed)
Pt pharmacy sent request for 90 days supply for pt Gabapentin 100 mg TID. Per pt chart, medication last refilled on 12-09-15 with same SIG but only 90 caps was sent to pharmacy. Pt pharmacy number is [phone].

## 2016-02-27 ENCOUNTER — Encounter: Payer: Self-pay | Admitting: Orthopedic Surgery

## 2016-02-27 ENCOUNTER — Ambulatory Visit (INDEPENDENT_AMBULATORY_CARE_PROVIDER_SITE_OTHER): Payer: Commercial Managed Care - HMO | Admitting: Orthopedic Surgery

## 2016-02-27 ENCOUNTER — Ambulatory Visit (INDEPENDENT_AMBULATORY_CARE_PROVIDER_SITE_OTHER): Payer: Commercial Managed Care - HMO

## 2016-02-27 VITALS — BP 113/71 | HR 71 | Ht 64.0 in | Wt 167.0 lb

## 2016-02-27 DIAGNOSIS — M25561 Pain in right knee: Secondary | ICD-10-CM | POA: Diagnosis not present

## 2016-02-27 DIAGNOSIS — M1711 Unilateral primary osteoarthritis, right knee: Secondary | ICD-10-CM

## 2016-02-27 NOTE — Patient Instructions (Addendum)
You have received an injection of steroids into the joint. 15% of patients will have increased pain within the 24 hours postinjection.   This is transient and will go away.   We recommend that you use ice packs on the injection site for 20 minutes every 2 hours and extra strength Tylenol 2 tablets every 8 as needed until the pain resolves.  If you continue to have pain after taking the Tylenol and using the ice please call the office for further instructions.  Total Knee Replacement Total knee replacement is a procedure to replace your knee joint with an artificial knee joint (prosthetic knee joint). The purpose of this surgery is to reduce pain and improve your knee function. LET [facility] CARE PROVIDER KNOW ABOUT:   Any allergies you have.  All medicines you are taking, including vitamins, herbs, eye drops, creams, and over-the-counter medicines.  Previous problems you or members of your family have had with the use of anesthetics.  Any blood disorders you have.  Previous surgeries you have had.  Medical conditions you have. RISKS AND COMPLICATIONS  Generally, total knee replacement is a safe procedure. However, problems can occur, including:  Loss of range of motion of the knee or instability of the knee.  Loosening of the prosthesis.  Infection.  Persistent pain. BEFORE THE PROCEDURE   Plan to have someone take you home after the procedure.  Do not eat or drink anything after midnight on the night before the procedure or as directed by your health care provider.  Ask your health care provider about:  Changing or stopping your regular medicines. This is especially important if you are taking diabetes medicines or blood thinners.  Taking medicines such as aspirin and ibuprofen. These medicines can thin your blood. Do not take these medicines before your procedure if your health care provider asks you not to.  Ask your health care provider about how your surgical site  will be marked or identified.  You may be given antibiotic medicines to help prevent infection. PROCEDURE   To reduce your risk of infection:  Your health care team will wash or sanitize their hands.  Your skin will be washed with soap.  An IV tube will be inserted into one of your veins. You will be given one or more of the following:  A medicine that makes you drowsy (sedative).  A medicine that makes you fall asleep (general anesthetic).  A medicine injected into your spine that numbs your body below the waist (spinal anesthetic).  A medicine to block feeling in your leg (nerve block) to help ease pain after surgery.  An incision will be made in your knee. Your surgeon will take out any damaged cartilage and bone by sawing off the damaged surfaces.  The surgeon will then put a new metal liner over the sawed-off portion of your thigh bone (femur) and a plastic liner over the sawed-off portion of one of the bones of your lower leg (tibia). This is to restore alignment and function to your knee. A plastic piece is often used to restore the surface of your knee cap. AFTER THE PROCEDURE   You will stay in a recovery area until your medicines wear off.  You may have drainage tubes to drain excess fluid from your knee. These tubes attach to a device that removes these fluids.  Once you are awake, stable, and taking fluids well, you will be taken to your hospital room.  You may be directed to  take actions to help prevent blood clots. These may include:  Walking shortly after surgery, with someone assisting you. Moving around after surgery helps to improve blood flow.  Wearing compression stockings or using different types of devices.  You will receive physical therapy as prescribed by your health care provider.   This information is not intended to replace advice given to you by your health care provider. Make sure you discuss any questions you have with your health care  provider.   Document Released: 09/24/2000 Document Revised: 03/09/2015 Document Reviewed: 07/29/2011 Elsevier Interactive Patient Education [employer].

## 2016-02-27 NOTE — Progress Notes (Signed)
Chief Complaint  Patient presents with  . Knee Pain    RIGHT KNEE PAIN   HPI   75 year old female presents with lower back pain and right knee pain. She's had MRI and epidural injection with good relief of her back and knee pain from epidural injection. She now complains of a one year history of lateral joint line knee pain with swelling. She denies any mechanical symptoms. The pain is worsening and has not been treated with any specific medications  Review of Systems  Constitutional: Negative for chills, fever, malaise/fatigue and weight loss.  Gastrointestinal: Negative.   Genitourinary: Negative.   Neurological: Positive for tingling and sensory change.    Past Medical History:  Diagnosis Date  . Anxiety   . Arthritis    in back .   Marland Kitchen Borderline diabetes   . Chronic abdominal pain   . Chronic back pain   . Chronic constipation   . Chronic nausea   . GERD (gastroesophageal reflux disease)   . High cholesterol   . Hypertension   . Noncompliance with medication regimen   . Pneumonia   . Poor historian   . Shortness of breath     even walking causes sob...     Past Surgical History:  Procedure Laterality Date  . ABDOMINAL HYSTERECTOMY    . BREAST SURGERY  unknown (many yrs)   excision  of lump right breast  was per pt "nothing"   . CARDIAC CATHETERIZATION    . CATARACT EXTRACTION W/PHACO Left 02/21/2015   Procedure: CATARACT EXTRACTION PHACO AND INTRAOCULAR LENS PLACEMENT LEFT EYE CDE=5.86;  Surgeon: Tonny Branch, MD;  Location: [facility];  Service: Ophthalmology;  Laterality: Left;  . CATARACT EXTRACTION W/PHACO Right 03/14/2015   Procedure: CATARACT EXTRACTION PHACO AND INTRAOCULAR LENS PLACEMENT ([place]);  Surgeon: Tonny Branch, MD;  Location: [facility];  Service: Ophthalmology;  Laterality: Right;  CDE:6.36  . COLONOSCOPY  04/16/2007   RMR: Normal rectum and colon  . COLONOSCOPY WITH ESOPHAGOGASTRODUODENOSCOPY (EGD) N/A 01/08/2013   [id number] erosive reflux esophagitis. Negative  H.pylori. Small hiatal hernia-s/p bx (gastritis)-TCS/Colonic polyp-hyperplastic  . DILATION AND CURETTAGE OF UTERUS    . LEFT HEART CATHETERIZATION WITH CORONARY ANGIOGRAM N/A 06/05/2012   Procedure: LEFT HEART CATHETERIZATION WITH CORONARY ANGIOGRAM;  Surgeon: Hillary Bow, MD;  Location[facility] C[facility] LAB;  Service: Cardiovascular;  Laterality: N/A;  . TOE SURGERY Right    5th toe, not sure what was wrong with it   Family History  Problem Relation Age of Onset  . Colon cancer Neg Hx    Social History  Substance Use Topics  . Smoking status: Never Smoker  . Smokeless tobacco: Not on file     Comment: Never smoked  . Alcohol use No    Current Outpatient Prescriptions:  .  ALPRAZolam (XANAX) 0.25 MG tablet, Take 1 tablet (0.25 mg total) by mouth 2 (two) times daily., Disp: 60 tablet, Rfl: 5 .  escitalopram (LEXAPRO) 10 MG tablet, Take 1 tablet (10 mg total) by mouth daily., Disp: 90 tablet, Rfl: 2 .  gabapentin (NEURONTIN) 100 MG capsule, Take 1 capsule (100 mg total) by mouth 3 (three) times daily., Disp: 270 capsule, Rfl: 2 .  lisinopril (PRINIVIL,ZESTRIL) 10 MG tablet, Take 10 mg by mouth daily., Disp: , Rfl:  .  megestrol (MEGACE) 40 MG/ML suspension, Reported on 12/09/2015, Disp: , Rfl:  .  nebivolol (BYSTOLIC) 5 MG tablet, Take 5 mg by mouth daily., Disp: , Rfl:  .  omeprazole (PRILOSEC) 20  MG capsule, TAKE 2 CAPSULES BY MOUTH TWICE A DAY BEFORE MEALS, Disp: 180 capsule, Rfl: 3 .  Probiotic Product (RESTORA) CAPS, Take by mouth., Disp: , Rfl:   BP 113/71   Pulse 71   Ht 5\' 4"  (1.626 m)   Wt 167 lb (75.8 kg)   BMI 28.67 kg/m   Physical Exam  Constitutional: She is oriented to person, place, and time. She appears well-developed and well-nourished. No distress.  Cardiovascular: Normal rate and intact distal pulses.   Neurological: She is alert and oriented to person, place, and time. She has normal reflexes. She exhibits normal muscle tone. Coordination normal.  Skin: Skin is  warm and dry. No rash noted. She is not diaphoretic. No erythema. No pallor.  Psychiatric: She has a normal mood and affect. Her behavior is normal. Judgment and thought content normal.    Ortho Exam  She is walking very labored  She does have some lateral joint line tenderness. No swelling. Her range of motion is normal without restriction or pain. Her ligaments feel stable. Muscle tone is normal. Skin is normal. She has no sensory deficit in her pulse is normal distally  Left knee normal range of motion stability and strength skin is normal   ASSESSMENT: My personal interpretation of the images:  Knee x-ray right knee  Severe valgus arthritis right knee  PLAN Intra-articular injection follow-up 6 months  Consider knee replacement  Procedure note right knee injection verbal consent was obtained to inject right knee joint  Timeout was completed to confirm the site of injection  The medications used were 40 mg of Depo-Medrol and 1% lidocaine 3 cc  Anesthesia was provided by ethyl chloride and the skin was prepped with alcohol.  After cleaning the skin with alcohol a 20-gauge needle was used to inject the right knee joint. There were no complications. A sterile bandage was applied.    Arther Abbott, MD 02/27/2016 11:31 AM

## 2016-03-21 DIAGNOSIS — H5210 Myopia, unspecified eye: Secondary | ICD-10-CM | POA: Diagnosis not present

## 2016-03-21 DIAGNOSIS — H52229 Regular astigmatism, unspecified eye: Secondary | ICD-10-CM | POA: Diagnosis not present

## 2016-03-21 DIAGNOSIS — Z0100 Encounter for examination of eyes and vision without abnormal findings: Secondary | ICD-10-CM | POA: Diagnosis not present

## 2016-03-21 DIAGNOSIS — H2510 Age-related nuclear cataract, unspecified eye: Secondary | ICD-10-CM | POA: Diagnosis not present

## 2016-03-21 DIAGNOSIS — I10 Essential (primary) hypertension: Secondary | ICD-10-CM | POA: Diagnosis not present

## 2016-03-29 ENCOUNTER — Other Ambulatory Visit: Payer: Self-pay | Admitting: Gastroenterology

## 2016-03-30 DIAGNOSIS — Z23 Encounter for immunization: Secondary | ICD-10-CM | POA: Diagnosis not present

## 2016-03-30 DIAGNOSIS — M545 Low back pain: Secondary | ICD-10-CM | POA: Diagnosis not present

## 2016-03-30 DIAGNOSIS — R630 Anorexia: Secondary | ICD-10-CM | POA: Diagnosis not present

## 2016-03-30 DIAGNOSIS — I10 Essential (primary) hypertension: Secondary | ICD-10-CM | POA: Diagnosis not present

## 2016-04-20 ENCOUNTER — Other Ambulatory Visit (HOSPITAL_COMMUNITY): Payer: Self-pay | Admitting: Internal Medicine

## 2016-04-20 DIAGNOSIS — Z1231 Encounter for screening mammogram for malignant neoplasm of breast: Secondary | ICD-10-CM

## 2016-04-27 DIAGNOSIS — Z6825 Body mass index (BMI) 25.0-25.9, adult: Secondary | ICD-10-CM | POA: Diagnosis not present

## 2016-04-27 DIAGNOSIS — I10 Essential (primary) hypertension: Secondary | ICD-10-CM | POA: Diagnosis not present

## 2016-04-27 DIAGNOSIS — E46 Unspecified protein-calorie malnutrition: Secondary | ICD-10-CM | POA: Diagnosis not present

## 2016-04-27 DIAGNOSIS — M545 Low back pain: Secondary | ICD-10-CM | POA: Diagnosis not present

## 2016-04-30 ENCOUNTER — Ambulatory Visit (HOSPITAL_COMMUNITY)
Admission: RE | Admit: 2016-04-30 | Discharge: 2016-04-30 | Disposition: A | Discharge status: Home / Self Care | Payer: Commercial Managed Care - HMO | Source: Ambulatory Visit | Attending: Internal Medicine | Admitting: Internal Medicine

## 2016-04-30 DIAGNOSIS — Z1231 Encounter for screening mammogram for malignant neoplasm of breast: Secondary | ICD-10-CM | POA: Diagnosis not present

## 2016-06-01 DIAGNOSIS — I10 Essential (primary) hypertension: Secondary | ICD-10-CM | POA: Diagnosis not present

## 2016-06-01 DIAGNOSIS — E782 Mixed hyperlipidemia: Secondary | ICD-10-CM | POA: Diagnosis not present

## 2016-06-01 DIAGNOSIS — E1165 Type 2 diabetes mellitus with hyperglycemia: Secondary | ICD-10-CM | POA: Diagnosis not present

## 2016-06-08 ENCOUNTER — Ambulatory Visit (HOSPITAL_COMMUNITY): Payer: Self-pay | Admitting: Psychiatry

## 2016-06-10 ENCOUNTER — Other Ambulatory Visit (HOSPITAL_COMMUNITY): Payer: Self-pay | Admitting: Psychiatry

## 2016-06-11 DIAGNOSIS — Z6826 Body mass index (BMI) 26.0-26.9, adult: Secondary | ICD-10-CM | POA: Diagnosis not present

## 2016-06-11 DIAGNOSIS — R634 Abnormal weight loss: Secondary | ICD-10-CM | POA: Diagnosis not present

## 2016-06-11 DIAGNOSIS — D696 Thrombocytopenia, unspecified: Secondary | ICD-10-CM | POA: Diagnosis not present

## 2016-06-11 DIAGNOSIS — Z0001 Encounter for general adult medical examination with abnormal findings: Secondary | ICD-10-CM | POA: Diagnosis not present

## 2016-06-11 DIAGNOSIS — I10 Essential (primary) hypertension: Secondary | ICD-10-CM | POA: Diagnosis not present

## 2016-06-11 DIAGNOSIS — N182 Chronic kidney disease, stage 2 (mild): Secondary | ICD-10-CM | POA: Diagnosis not present

## 2016-06-11 DIAGNOSIS — E1165 Type 2 diabetes mellitus with hyperglycemia: Secondary | ICD-10-CM | POA: Diagnosis not present

## 2016-06-11 DIAGNOSIS — E782 Mixed hyperlipidemia: Secondary | ICD-10-CM | POA: Diagnosis not present

## 2016-06-11 DIAGNOSIS — R630 Anorexia: Secondary | ICD-10-CM | POA: Diagnosis not present

## 2016-06-13 ENCOUNTER — Telehealth (HOSPITAL_COMMUNITY): Payer: Self-pay | Admitting: *Deleted

## 2016-06-13 ENCOUNTER — Other Ambulatory Visit (HOSPITAL_COMMUNITY): Payer: Self-pay | Admitting: Psychiatry

## 2016-06-13 MED ORDER — GABAPENTIN 100 MG PO CAPS: 100.0000 mg | ORAL_CAPSULE | Freq: Three times a day (TID) | ORAL | 2 refills | Status: DC

## 2016-06-13 MED ORDER — ALPRAZOLAM 0.25 MG PO TABS: 0.2500 mg | ORAL_TABLET | Freq: Two times a day (BID) | ORAL | 0 refills | Status: DC

## 2016-06-13 MED ORDER — ESCITALOPRAM OXALATE 10 MG PO TABS: 10.0000 mg | ORAL_TABLET | Freq: Every day | ORAL | 2 refills | Status: DC

## 2016-06-13 NOTE — Telephone Encounter (Signed)
Pt walked into office to get refills for her medications. Pt number is [phone]. Per Dr. Harrington Challenger to call in 1 mo supply of pt Xanax to her pharmacy. Called pt pharmacy and spoke with [place].

## 2016-06-13 NOTE — Telephone Encounter (Signed)
Pt called stating she need to resch her no show appt and need refills as well. Pt no showed for her appt on 06-08-2016. Pt is rescheduled for 07-09-2016. Per pt she need refills for her Xanax, Lexapro and Gabapentin. All of pt medications were last printed/filled on 12-09-2015. Pt number is [phone].

## 2016-06-13 NOTE — Telephone Encounter (Signed)
Noted. Pt walked into office to get refills for her medications. Informed pt Xanax will be called into her pharmacy and the other 2 meds provider fills are already called in. Pt showed understanding.

## 2016-06-13 NOTE — Telephone Encounter (Signed)
[phone], PATIENT SAID THIS IS HER PHONE #.  SHE IS HERE AND NEED HELP WITH DETERMINING IF SHE NEED REFILLS.   SHE MISSED HER APPOINTMENT ON 06/08/16.

## 2016-06-13 NOTE — Telephone Encounter (Signed)
lexapro and gabapentin sent, you may call in one month supply of xanax

## 2016-07-09 ENCOUNTER — Encounter (HOSPITAL_COMMUNITY): Payer: Self-pay | Admitting: Psychiatry

## 2016-07-09 ENCOUNTER — Ambulatory Visit (INDEPENDENT_AMBULATORY_CARE_PROVIDER_SITE_OTHER): Payer: Commercial Managed Care - HMO | Admitting: Psychiatry

## 2016-07-09 VITALS — BP 126/69 | HR 76 | Ht 64.0 in | Wt 173.4 lb

## 2016-07-09 DIAGNOSIS — F321 Major depressive disorder, single episode, moderate: Secondary | ICD-10-CM

## 2016-07-09 DIAGNOSIS — Z79899 Other long term (current) drug therapy: Secondary | ICD-10-CM

## 2016-07-09 DIAGNOSIS — Z800 Family history of malignant neoplasm of digestive organs: Secondary | ICD-10-CM | POA: Diagnosis not present

## 2016-07-09 MED ORDER — ESCITALOPRAM OXALATE 10 MG PO TABS: 10.0000 mg | ORAL_TABLET | Freq: Every day | ORAL | 2 refills | Status: DC

## 2016-07-09 MED ORDER — GABAPENTIN 100 MG PO CAPS: 100.0000 mg | ORAL_CAPSULE | Freq: Three times a day (TID) | ORAL | 2 refills | Status: DC

## 2016-07-09 MED ORDER — ALPRAZOLAM 0.25 MG PO TABS: 0.2500 mg | ORAL_TABLET | Freq: Two times a day (BID) | ORAL | 3 refills | Status: DC

## 2016-07-09 NOTE — Progress Notes (Signed)
Patient ID: Audrey Johnson, female   DOB: 1941/04/09, 76 y.o.   MRN: [id number] Patient ID: Audrey Johnson, female   DOB: 12-10-1940, 76 y.o.   MRN: [id number] Patient ID: Audrey Johnson, female   DOB: [birth year], 76 y.o.   MRN: [id number] Patient ID: Audrey Johnson, female   DOB: [birth year], 76 y.o.   MRN: [id number] Patient ID: Audrey Johnson, female   DOB: August 29, 1940, 76 y.o.   MRN: [id number] Patient ID: Audrey Johnson, female   DOB: [birth year], 76 y.o.   MRN: [id number] Patient ID: Audrey Johnson, female   DOB: 04/04/1941, 76 y.o.   MRN: [id number] Patient ID: Audrey Johnson, female   DOB: 08/11/1940, 75 y.o.   MRN: [id number] Patient ID: Audrey Johnson, female   DOB: Aug 08, 1940, 76 y.o.   MRN: [id number] Patient ID: Audrey Johnson, female   DOB: 06/24/1941, 76 y.o.   MRN: [id number] Patient ID: Audrey Johnson, female   DOB: 08-06-1940, 76 y.o.   MRN: [id number] Patient ID: Audrey Johnson, female   DOB: 1941-05-25, 76 y.o.   MRN: [id number] Patient ID: Audrey Johnson, female   DOB: 13-Apr-1941, 76 y.o.   MRN: [id number] Patient ID: Audrey Johnson, female   DOB: January 23, 1941, 76 y.o.   MRN: [id number]  Psychiatric Assessment Adult  Patient Identification:  Audrey Johnson Date of Evaluation:  07/09/2016 Chief Complaint: I feel dizzy during the day History of Chief Complaint:   Chief Complaint  Patient presents with  . Depression  . Anxiety  . Follow-up    Depression         Associated symptoms include appetite change.  Past medical history includes anxiety.   Anxiety     Altered Mental Status  Associated symptoms include joint swelling.   this patient is a 76 year old widowed black female who lives alonein [place].The patient used to work in a [employer] but has been retired for several years.  The patient was referred by Dr. Nevada Crane, her primary Dr. Apparently she's not had any history of mental health symptoms until about 2 months  ago. She started to have severe stomach pain and has gone to several emergency room this in the local area. She's had 9 emergency room visits in the last couple of months. Her last one was yesterday. She's had 3 abdominal CTs in the last month, and ultrasound and blood work. Nothing significantly wrong has been found.  The patient is on numerous medications for her stomach but they don't seem to help. She was prescribed Lexapro by her primary doctor but she refused to take it.  she is taking a low dose of Xanax  She returns after 6 months. She has had 1 fall since I last saw her and it was suspected she might have gout but this was not confirmed. She's not had any further falls. She states generally her mood is good she is sleeping well and her energy is pretty good. She claims that she doesn't eat well unless she has Megace to take. She's not had severe stomach pain. She's been compliant with her medications and feels "back to my normal self." She denies being depressed anxious or having any further hallucinations Review of Systems  Constitutional: Positive for appetite change and unexpected weight change.  Musculoskeletal: Positive for back pain and joint swelling.  Psychiatric/Behavioral: Positive for depression.   Physical Exam not done  Depressive Symptoms: depressed mood, anhedonia, insomnia, psychomotor agitation, difficulty concentrating, hopelessness, suicidal thoughts  with specific plan, anxiety, loss of energy/fatigue, weight loss,  (Hypo) Manic Symptoms:   Elevated Mood:  No Irritable Mood:  Yes Grandiosity:  No Distractibility:  No Labiality of Mood:  Yes Delusions:  Yes Hallucinations:  Yes Impulsivity:  No Sexually Inappropriate Behavior:  No Financial Extravagance:  No Flight of Ideas:  No  Anxiety Symptoms: Excessive Worry:  Yes Panic Symptoms:  No Agoraphobia:  No Obsessive Compulsive: No  Symptoms: None, Specific Phobias:  No Social Anxiety:   No  Psychotic Symptoms:  Hallucinations: Yes Visual Delusions:  Yes Paranoia:  No   Ideas of Reference:  No  PTSD Symptoms: Ever had a traumatic exposure:  No Had a traumatic exposure in the last month:  No Re-experiencing: No None Hypervigilance:  No Hyperarousal: No None Avoidance: No None  Traumatic Brain Injury: No   Past Psychiatric History: Diagnosis: None   Hospitalizations: None   Outpatient Care: None   Substance Abuse Care: None   Self-Mutilation: None   Suicidal Attempts: None   Violent Behaviors: None    Past Medical History:   Past Medical History:  Diagnosis Date  . Anxiety   . Arthritis    in back .   Marland Kitchen Borderline diabetes   . Chronic abdominal pain   . Chronic back pain   . Chronic constipation   . Chronic nausea   . GERD (gastroesophageal reflux disease)   . High cholesterol   . Hypertension   . Noncompliance with medication regimen   . Pneumonia   . Poor historian   . Shortness of breath     even walking causes sob...    History of Loss of Consciousness:  No Seizure History:  No Cardiac History:  No Allergies:  No Known Allergies Current Medications:  Current Outpatient Prescriptions  Medication Sig Dispense Refill  . ALPRAZolam (XANAX) 0.25 MG tablet Take 1 tablet (0.25 mg total) by mouth 2 (two) times daily. 60 tablet 3  . escitalopram (LEXAPRO) 10 MG tablet Take 1 tablet (10 mg total) by mouth daily. 90 tablet 2  . gabapentin (NEURONTIN) 100 MG capsule Take 1 capsule (100 mg total) by mouth 3 (three) times daily. 270 capsule 2  . lisinopril (PRINIVIL,ZESTRIL) 10 MG tablet Take 10 mg by mouth daily.    . megestrol (MEGACE) 40 MG/ML suspension Reported on 12/09/2015    . nebivolol (BYSTOLIC) 5 MG tablet Take 5 mg by mouth daily.    Marland Kitchen omeprazole (PRILOSEC) 20 MG capsule TAKE 2 CAPSULES BY MOUTH TWICE A DAY BEFORE MEALS 180 capsule 1  . Probiotic Product (RESTORA) CAPS Take by mouth.     No current facility-administered medications for  this visit.     Previous Psychotropic Medications:  Medication Dose   Xanax   0.25 mg every 8 hours as needed                      Substance Abuse History in the last 12 months: Substance Age of 1st Use Last Use Amount Specific Type  Nicotine      Alcohol      Cannabis      Opiates      Cocaine      Methamphetamines      LSD      Ecstasy      Benzodiazepines      Caffeine      Inhalants      Others:  Medical Consequences of Substance Abuse: n/a  Legal Consequences of Substance Abuse: n/a  Family Consequences of Substance Abuse: n/a  Blackouts:  No DT's:  No Withdrawal Symptoms:  No None  Social History: Current Place of Residence: [place] of Birth: Unknown Family Members: Lives with son Marital Status:  Widowed Children:   Sons: 1  Daughters: 1 Relationships:  Education:  [employer] Problems/Performance:  Religious Beliefs/Practices: Unknown History of Abuse: none Pensions consultant; Manufacturing engineer History:  None. Legal History: None Hobbies/Interests: Church  Family History:   Family History  Problem Relation Age of Onset  . 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 depression, single episode with psychotic  features and somatization disorder  AXIS I Major Depression, single episode with psychotic features, and somatization disorder   AXIS II Deferred  AXIS III Past Medical History:  Diagnosis Date  . Anxiety   . Arthritis    in back .   Marland Kitchen Borderline diabetes   . Chronic abdominal pain   . Chronic back pain   . Chronic constipation   . Chronic nausea   . GERD (gastroesophageal reflux disease)   . High cholesterol   . Hypertension   . Noncompliance with medication regimen   . Pneumonia   . Poor historian   . Shortness of breath     even walking causes sob...      AXIS IV other psychosocial or environmental problems  AXIS V 11-20 some danger of hurting self or others possible OR occasionally fails to maintain minimal personal hygiene OR gross impairment in communication   Treatment Plan/Recommendations:  Plan of Care: Medication management   Laboratory:    Psychotherapy: She is scheduled to see Peggy Bynum   Medications:  She'll continue Lexapro 10 mg every morning for depression   She will continue Xanax 0.25 mg 2 times a day  for anxiety And gabapentin 100 mg 3 times a day for anxiety and neuropathic pain   Routine PRN Medications:  No  Consultations:   Safety Concerns:  She denies any thoughts or plans to hurt self or others   Other:  She'll return in 4 months     [name], MD 1/8/20189:16 AM

## 2016-08-07 DIAGNOSIS — Z23 Encounter for immunization: Secondary | ICD-10-CM | POA: Diagnosis not present

## 2016-08-21 ENCOUNTER — Other Ambulatory Visit (HOSPITAL_COMMUNITY): Payer: Self-pay | Admitting: Psychiatry

## 2016-08-22 ENCOUNTER — Telehealth (HOSPITAL_COMMUNITY): Payer: Self-pay | Admitting: *Deleted

## 2016-08-22 NOTE — Telephone Encounter (Signed)
Called pt pharmacy and spoke with [facility]. Per Cheri Rous he is not sure if it was the automatic system requesting pt Mirtazapine or if it was pt requesting her medication. Informed Chase per pt chart she does not have this requested medication on her current list. Informed Chase that per pt chart she have Lexapro. Per Cheri Rous, if pt is not taking the medication anymore he can d/c it in her chart. Informed Chase that if pt calls in for this requested medication to have pt call office but for now to d/c medication. Chase verbalized understanding.

## 2016-08-22 NOTE — Telephone Encounter (Signed)
Noted. Will call pharmacy.

## 2016-08-22 NOTE — Telephone Encounter (Signed)
No record of mirtazapine in the patient chart. Please verify it with pharmacy.

## 2016-08-22 NOTE — Telephone Encounter (Signed)
Message sent to Dr. Modesta Messing and Dr. Harrington Challenger.

## 2016-08-22 NOTE — Telephone Encounter (Signed)
Pt pharmacy [facility] in [place] requesting refills for pt Mirtazapine 7.5 mg QHS. Per pt chart, this medication is not on her current list of meds. Per pt chart, she is on Lexapro for depression per notes on 07-09-2016. Pt pharmacy number is [phone].

## 2016-08-25 ENCOUNTER — Other Ambulatory Visit (HOSPITAL_COMMUNITY): Payer: Self-pay | Admitting: Psychiatry

## 2016-08-28 ENCOUNTER — Encounter: Payer: Self-pay | Admitting: Orthopedic Surgery

## 2016-08-28 ENCOUNTER — Ambulatory Visit (INDEPENDENT_AMBULATORY_CARE_PROVIDER_SITE_OTHER): Payer: Medicare HMO | Admitting: Orthopedic Surgery

## 2016-08-28 ENCOUNTER — Telehealth (HOSPITAL_COMMUNITY): Payer: Self-pay | Admitting: *Deleted

## 2016-08-28 ENCOUNTER — Ambulatory Visit (INDEPENDENT_AMBULATORY_CARE_PROVIDER_SITE_OTHER): Payer: Medicare HMO

## 2016-08-28 ENCOUNTER — Other Ambulatory Visit (HOSPITAL_COMMUNITY): Payer: Self-pay | Admitting: Psychiatry

## 2016-08-28 DIAGNOSIS — M1711 Unilateral primary osteoarthritis, right knee: Secondary | ICD-10-CM | POA: Diagnosis not present

## 2016-08-28 MED ORDER — TRAMADOL HCL 50 MG PO TABS: 50.0000 mg | ORAL_TABLET | Freq: Four times a day (QID) | ORAL | 5 refills | Status: DC | PRN

## 2016-08-28 NOTE — Telephone Encounter (Signed)
phone call from [facility].  They said it was a denial for  patient's  Mirtazapine.   He said the patient is a little confused about the denial.  they need clarification on the reason for the denial.

## 2016-08-28 NOTE — Patient Instructions (Signed)

## 2016-08-28 NOTE — Progress Notes (Signed)
Progress Note   Patient ID: Audrey Johnson, female   DOB: 1940-09-22, 76 y.o.   MRN: [id number]  Chief Complaint  Patient presents with  . Follow-up    6 month follow up right knee with xray    HPI Audrey Johnson is a 76 y.o. female.   HPI 76 year old female with chronic arthritis lateral compartment valgus deformity right knee currently managed nonoperatively. Complains of good and bad days does not want surgery at this time would like something different for pain  Review of Systems Review of Systems Normal neuro  Denies fever  Examination There were no vitals taken for this visit.  Gen. appearance the patient's appearance is normal with normal grooming and  hygiene The patient is oriented to person place and time Mood and affect are normal   Ortho Exam Stability tests are normal  Motor exam 5/5 manual muscle testing , no atrophy  Skin is normal (no rash or erythema)  Valgus deformity in the right knee with tenderness in the lateral compartment knee flexion 120 knee extension full  Medical decision-making Diagnosis, Data, Plan (risk)  Plain films today show severe arthritis in valgus alignment of the right knee  Patient was given an injection a follow-up appointment scheduled for 6 months with x-ray and started on tramadol 50 mg every 6 for pain   Procedure note right knee injection verbal consent was obtained to inject right knee joint  Timeout was completed to confirm the site of injection  The medications used were 40 mg of Depo-Medrol and 1% lidocaine 3 cc  Anesthesia was provided by ethyl chloride and the skin was prepped with alcohol.  After cleaning the skin with alcohol a 20-gauge needle was used to inject the right knee joint. There were no complications. A sterile bandage was applied.  Meds ordered this encounter  Medications  . traMADol (ULTRAM) 50 MG tablet    Sig: Take 1 tablet (50 mg total) by mouth every 6 (six) hours as needed.   Dispense:  90 tablet    Refill:  5     Arther Abbott, MD 08/28/2016 12:13 PM

## 2016-08-29 NOTE — Telephone Encounter (Signed)
phone call from [facility].  They said it was a denial for  patient's  Mirtazapine.   He said the patient is a little confused about the denial.  they need clarification on the reason for the denial

## 2016-09-12 DIAGNOSIS — E1165 Type 2 diabetes mellitus with hyperglycemia: Secondary | ICD-10-CM | POA: Diagnosis not present

## 2016-09-12 DIAGNOSIS — I10 Essential (primary) hypertension: Secondary | ICD-10-CM | POA: Diagnosis not present

## 2016-09-14 DIAGNOSIS — E1165 Type 2 diabetes mellitus with hyperglycemia: Secondary | ICD-10-CM | POA: Diagnosis not present

## 2016-09-14 DIAGNOSIS — Z6826 Body mass index (BMI) 26.0-26.9, adult: Secondary | ICD-10-CM | POA: Diagnosis not present

## 2016-09-14 DIAGNOSIS — E782 Mixed hyperlipidemia: Secondary | ICD-10-CM | POA: Diagnosis not present

## 2016-09-14 DIAGNOSIS — D696 Thrombocytopenia, unspecified: Secondary | ICD-10-CM | POA: Diagnosis not present

## 2016-09-14 DIAGNOSIS — N182 Chronic kidney disease, stage 2 (mild): Secondary | ICD-10-CM | POA: Diagnosis not present

## 2016-09-14 DIAGNOSIS — I10 Essential (primary) hypertension: Secondary | ICD-10-CM | POA: Diagnosis not present

## 2016-09-14 DIAGNOSIS — F411 Generalized anxiety disorder: Secondary | ICD-10-CM | POA: Diagnosis not present

## 2016-11-08 ENCOUNTER — Ambulatory Visit (HOSPITAL_COMMUNITY): Payer: Self-pay | Admitting: Psychiatry

## 2016-11-23 DIAGNOSIS — Z6825 Body mass index (BMI) 25.0-25.9, adult: Secondary | ICD-10-CM | POA: Diagnosis not present

## 2016-11-23 DIAGNOSIS — R638 Other symptoms and signs concerning food and fluid intake: Secondary | ICD-10-CM | POA: Diagnosis not present

## 2016-11-23 DIAGNOSIS — K529 Noninfective gastroenteritis and colitis, unspecified: Secondary | ICD-10-CM | POA: Diagnosis not present

## 2016-11-23 DIAGNOSIS — F411 Generalized anxiety disorder: Secondary | ICD-10-CM | POA: Diagnosis not present

## 2016-11-23 DIAGNOSIS — F330 Major depressive disorder, recurrent, mild: Secondary | ICD-10-CM | POA: Diagnosis not present

## 2016-11-28 ENCOUNTER — Emergency Department (HOSPITAL_COMMUNITY): Payer: Medicare HMO

## 2016-11-28 ENCOUNTER — Encounter (HOSPITAL_COMMUNITY): Payer: Self-pay | Admitting: Cardiology

## 2016-11-28 ENCOUNTER — Emergency Department (HOSPITAL_COMMUNITY)
Admission: EM | Admit: 2016-11-28 | Discharge: 2016-11-28 | Disposition: A | Discharge status: Home / Self Care | Payer: Medicare HMO | Attending: Emergency Medicine | Admitting: Emergency Medicine

## 2016-11-28 DIAGNOSIS — R079 Chest pain, unspecified: Secondary | ICD-10-CM | POA: Diagnosis not present

## 2016-11-28 DIAGNOSIS — R0789 Other chest pain: Secondary | ICD-10-CM | POA: Diagnosis not present

## 2016-11-28 DIAGNOSIS — I10 Essential (primary) hypertension: Secondary | ICD-10-CM | POA: Insufficient documentation

## 2016-11-28 DIAGNOSIS — Z79899 Other long term (current) drug therapy: Secondary | ICD-10-CM | POA: Insufficient documentation

## 2016-11-28 LAB — COMPREHENSIVE METABOLIC PANEL
ALT: 13 U/L — ABNORMAL LOW (ref 14–54)
AST: 17 U/L (ref 15–41)
Albumin: 3.6 g/dL (ref 3.5–5.0)
Alkaline Phosphatase: 106 U/L (ref 38–126)
Anion gap: 9 (ref 5–15)
BUN: 7 mg/dL (ref 6–20)
CO2: 26 mmol/L (ref 22–32)
Calcium: 9.3 mg/dL (ref 8.9–10.3)
Chloride: 103 mmol/L (ref 101–111)
Creatinine, Ser: 0.93 mg/dL (ref 0.44–1.00)
GFR calc Af Amer: >60 mL/min (ref >60)
GFR calc non Af Amer: 59 mL/min — ABNORMAL LOW (ref >60)
Glucose, Bld: 139 mg/dL — ABNORMAL HIGH (ref 65–99)
Potassium: 4 mmol/L (ref 3.5–5.1)
Sodium: 138 mmol/L (ref 135–145)
Total Bilirubin: 0.9 mg/dL (ref 0.3–1.2)
Total Protein: 7.8 g/dL (ref 6.5–8.1)

## 2016-11-28 LAB — CBC WITH DIFFERENTIAL/PLATELET
Basophils Absolute: 0 10*3/uL (ref 0.0–0.1)
Basophils Relative: 0 %
Eosinophils Absolute: 0.1 10*3/uL (ref 0.0–0.7)
Eosinophils Relative: 0 %
HCT: 41.1 % (ref 36.0–46.0)
Hemoglobin: 13.4 g/dL (ref 12.0–15.0)
Lymphocytes Relative: 14 %
Lymphs Abs: 1.7 10*3/uL (ref 0.7–4.0)
MCH: 29.6 pg (ref 26.0–34.0)
MCHC: 32.6 g/dL (ref 30.0–36.0)
MCV: 90.7 fL (ref 78.0–100.0)
Monocytes Absolute: 1 10*3/uL (ref 0.1–1.0)
Monocytes Relative: 8 %
Neutro Abs: 9.3 10*3/uL — ABNORMAL HIGH (ref 1.7–7.7)
Neutrophils Relative %: 78 %
Platelets: 398 10*3/uL (ref 150–400)
RBC: 4.53 MIL/uL (ref 3.87–5.11)
RDW: 13.1 % (ref 11.5–15.5)
WBC: 12 10*3/uL — ABNORMAL HIGH (ref 4.0–10.5)

## 2016-11-28 LAB — TROPONIN I: Troponin I: <0.03 ng/mL (ref <0.03)

## 2016-11-28 MED ORDER — HYDROMORPHONE HCL 1 MG/ML IJ SOLN
1.0000 mg | Freq: Once | INTRAMUSCULAR | Status: AC
  Administered 2016-11-28: 1 mg via INTRAVENOUS
  Filled 2016-11-28: qty 1

## 2016-11-28 MED ORDER — ONDANSETRON HCL 4 MG/2ML IJ SOLN
4.0000 mg | Freq: Once | INTRAMUSCULAR | Status: AC
  Administered 2016-11-28: 4 mg via INTRAVENOUS
  Filled 2016-11-28: qty 2

## 2016-11-28 MED ORDER — TRAMADOL HCL 50 MG PO TABS: 50.0000 mg | ORAL_TABLET | Freq: Four times a day (QID) | ORAL | 0 refills | Status: DC | PRN

## 2016-11-28 NOTE — Discharge Instructions (Signed)
Follow up with your md next week if problems °

## 2016-11-28 NOTE — ED Triage Notes (Signed)
Right sided chest pain that radiates down right arm since yesterday morning.

## 2016-11-28 NOTE — ED Provider Notes (Signed)
[facility] DEPT Provider Note   CSN: [id number] Arrival date & time: 11/28/16  0650     History   Chief Complaint Chief Complaint  Patient presents with  . Chest Pain    HPI Audrey Johnson is a 76 y.o. female.  Patient complains of right-sided chest discomfort and pain in her right shoulder. Patient states the pain started yesterday and is worse with movement   The history is provided by the patient. No language interpreter was used.  Chest Pain   This is a new problem. The current episode started 3 to 5 hours ago. The problem occurs constantly. The problem has not changed since onset.The pain is associated with movement. The pain is present in the lateral region. The pain is at a severity of 8/10. The pain is moderate. The quality of the pain is described as sharp. Pertinent negatives include no abdominal pain, no back pain, no cough and no headaches.  Pertinent negatives for past medical history include no seizures.    Past Medical History:  Diagnosis Date  . Anxiety   . Arthritis    in back .   Marland Kitchen Borderline diabetes   . Chronic abdominal pain   . Chronic back pain   . Chronic constipation   . Chronic nausea   . GERD (gastroesophageal reflux disease)   . High cholesterol   . Hypertension   . Noncompliance with medication regimen   . Pneumonia   . Poor historian   . Shortness of breath     even walking causes sob...     Patient Active Problem List   Diagnosis Date Noted  . GERD (gastroesophageal reflux disease) 07/20/2013  . Abnormal LFTs 07/20/2013  . Constipation 07/20/2013  . Depression 04/24/2013  . Chronic abdominal pain 12/04/2012  . Dyspnea 05/19/2012  . Benign hypertension 05/19/2012  . Hyperlipidemia 05/19/2012    Past Surgical History:  Procedure Laterality Date  . ABDOMINAL HYSTERECTOMY    . BREAST SURGERY  unknown (many yrs)   excision  of lump right breast  was per pt "nothing"   . CARDIAC CATHETERIZATION    . CATARACT EXTRACTION  W/PHACO Left 02/21/2015   Procedure: CATARACT EXTRACTION PHACO AND INTRAOCULAR LENS PLACEMENT LEFT EYE CDE=5.86;  Surgeon: Tonny Branch, MD;  Location: [facility];  Service: Ophthalmology;  Laterality: Left;  . CATARACT EXTRACTION W/PHACO Right 03/14/2015   Procedure: CATARACT EXTRACTION PHACO AND INTRAOCULAR LENS PLACEMENT ([place]);  Surgeon: Tonny Branch, MD;  Location: [facility];  Service: Ophthalmology;  Laterality: Right;  CDE:6.36  . COLONOSCOPY  04/16/2007   RMR: Normal rectum and colon  . COLONOSCOPY WITH ESOPHAGOGASTRODUODENOSCOPY (EGD) N/A 01/08/2013   FOY:DXAJ erosive reflux esophagitis. Negative H.pylori. Small hiatal hernia-s/p bx (gastritis)-TCS/Colonic polyp-hyperplastic  . DILATION AND CURETTAGE OF UTERUS    . LEFT HEART CATHETERIZATION WITH CORONARY ANGIOGRAM N/A 06/05/2012   Procedure: LEFT HEART CATHETERIZATION WITH CORONARY ANGIOGRAM;  Surgeon: Hillary Bow, MD;  Location[facility] C[facility] LAB;  Service: Cardiovascular;  Laterality: N/A;  . TOE SURGERY Right    5th toe, not sure what was wrong with it    OB History    No data available       Home Medications    Prior to Admission medications   Medication Sig Start Date End Date Taking? Authorizing Provider  ALPRAZolam (XANAX) 0.25 MG tablet Take 1 tablet (0.25 mg total) by mouth 2 (two) times daily. 07/09/16   Cloria Spring, MD  escitalopram (LEXAPRO) 10 MG tablet Take 1  tablet (10 mg total) by mouth daily. 07/09/16   Cloria Spring, MD  gabapentin (NEURONTIN) 100 MG capsule Take 1 capsule (100 mg total) by mouth 3 (three) times daily. 07/09/16 07/09/17  Cloria Spring, MD  lisinopril (PRINIVIL,ZESTRIL) 10 MG tablet Take 10 mg by mouth daily.    [provider]  megestrol (MEGACE) 40 MG/ML suspension Reported on 12/09/2015 07/08/15   [provider]  nebivolol (BYSTOLIC) 5 MG tablet Take 5 mg by mouth daily.    [provider]  omeprazole (PRILOSEC) 20 MG capsule TAKE 2 CAPSULES BY MOUTH TWICE A DAY BEFORE MEALS 03/29/16    Annitta Needs, NP  Probiotic Product (RESTORA) CAPS Take by mouth.    [provider]  traMADol (ULTRAM) 50 MG tablet Take 1 tablet (50 mg total) by mouth every 6 (six) hours as needed. 11/28/16   Milton Ferguson, MD    Family History Family History  Problem Relation Age of Onset  . Colon cancer Neg Hx     Social History Social History  Substance Use Topics  . Smoking status: Never Smoker  . Smokeless tobacco: Never Used     Comment: Never smoked  . Alcohol use No     Allergies   Patient has no known allergies.   Review of Systems Review of Systems  Constitutional: Negative for appetite change and fatigue.  HENT: Negative for congestion, ear discharge and sinus pressure.   Eyes: Negative for discharge.  Respiratory: Negative for cough.   Cardiovascular: Positive for chest pain.  Gastrointestinal: Negative for abdominal pain and diarrhea.  Genitourinary: Negative for frequency and hematuria.  Musculoskeletal: Negative for back pain.  Skin: Negative for rash.  Neurological: Negative for seizures and headaches.  Psychiatric/Behavioral: Negative for hallucinations.     Physical Exam Updated Vital Signs BP (!) 123/55   Pulse 74   Temp 99.7 F (37.6 C) (Oral)   Resp 16   Ht 5\' 7"  (1.702 m)   Wt 70.3 kg (155 lb)   SpO2 100%   BMI 24.28 kg/m   Physical Exam  Constitutional: She is oriented to person, place, and time. She appears well-developed.  HENT:  Head: Normocephalic.  Eyes: Conjunctivae and EOM are normal. No scleral icterus.  Neck: Neck supple. No thyromegaly present.  Cardiovascular: Normal rate and regular rhythm.  Exam reveals no gallop and no friction rub.   No murmur heard. Pulmonary/Chest: No stridor. She has no wheezes. She has no rales. She exhibits tenderness.  Moderate to severe tenderness to right chest  Abdominal: She exhibits no distension. There is no tenderness. There is no rebound.  Musculoskeletal: Normal range of motion. She  exhibits no edema.  Lymphadenopathy:    She has no cervical adenopathy.  Neurological: She is oriented to person, place, and time. She exhibits normal muscle tone. Coordination normal.  Skin: No rash noted. No erythema.  Psychiatric: She has a normal mood and affect. Her behavior is normal.     ED Treatments / Results  Labs (all labs ordered are listed, but only abnormal results are displayed) Labs Reviewed  CBC WITH DIFFERENTIAL/PLATELET - Abnormal; Notable for the following:       Result Value   WBC 12.0 (*)    Neutro Abs 9.3 (*)    All other components within normal limits  COMPREHENSIVE METABOLIC PANEL - Abnormal; Notable for the following:    Glucose, Bld 139 (*)    ALT 13 (*)    GFR calc non Af  Amer 32 (*)    All other components within normal limits  TROPONIN I    EKG  EKG Interpretation  Date/Time:  Wednesday Nov 28 2016 06:58:05 EDT Ventricular Rate:  81 PR Interval:    QRS Duration: 72 QT Interval:  402 QTC Calculation: 467 R Axis:   55 Text Interpretation:  Sinus rhythm Low voltage, precordial leads Borderline T wave abnormalities Interpretation limited secondary to artifact patient needs repeat ekg Confirmed by Ripley Fraise [phone]) on 11/28/2016 7:05:59 AM Also confirmed by Chyenne Sobczak  MD, Broadus John [phone])  on 11/28/2016 7:34:18 AM       Radiology Dg Chest 2 View  Result Date: 11/28/2016 CLINICAL DATA:  Chest pain on the right EXAM: CHEST  2 VIEW COMPARISON:  04/13/2013 FINDINGS: Low volume frontal chest with interstitial crowding. There is no edema, consolidation, effusion, or pneumothorax. Normal heart size and aortic contours. Probable bilateral chronic rotator cuff tear. Metallic bodies overlap the left humerus and chest wall. Diffuse disc degeneration. IMPRESSION: No evidence of acute disease. Electronically Signed   By: Monte Fantasia M.D.   On: 11/28/2016 08:15    Procedures Procedures (including critical care time)  Medications Ordered in  ED Medications  HYDROmorphone (DILAUDID) injection 1 mg (1 mg Intravenous Given 11/28/16 0756)  ondansetron (ZOFRAN) injection 4 mg (4 mg Intravenous Given 11/28/16 0756)     Initial Impression / Assessment and Plan / ED Course  I have reviewed the triage vital signs and the nursing notes.  Pertinent labs & imaging results that were available during my care of the patient were reviewed by me and considered in my medical decision making (see chart for details).     Labs EKG chest x-ray unremarkable. Patient with chest pain. I suspect this chest wall inflammation. She will be placed on Ultram and follow-up with her PCP  Final Clinical Impressions(s) / ED Diagnoses   Final diagnoses:  Chest wall pain    New Prescriptions New Prescriptions   TRAMADOL (ULTRAM) 50 MG TABLET    Take 1 tablet (50 mg total) by mouth every 6 (six) hours as needed.     Milton Ferguson, MD 11/28/16 [phone]

## 2016-11-28 NOTE — ED Notes (Signed)
Room air sats 88%.  Placed on 2 liters [place] oxygen

## 2016-12-05 ENCOUNTER — Telehealth (HOSPITAL_COMMUNITY): Payer: Self-pay | Admitting: *Deleted

## 2016-12-05 ENCOUNTER — Emergency Department (HOSPITAL_COMMUNITY): Payer: Medicare HMO

## 2016-12-05 ENCOUNTER — Encounter (HOSPITAL_COMMUNITY): Payer: Self-pay | Admitting: *Deleted

## 2016-12-05 ENCOUNTER — Emergency Department (HOSPITAL_COMMUNITY)
Admission: EM | Admit: 2016-12-05 | Discharge: 2016-12-05 | Disposition: A | Discharge status: Home / Self Care | Payer: Medicare HMO | Attending: Emergency Medicine | Admitting: Emergency Medicine

## 2016-12-05 DIAGNOSIS — R52 Pain, unspecified: Secondary | ICD-10-CM | POA: Diagnosis not present

## 2016-12-05 DIAGNOSIS — R079 Chest pain, unspecified: Secondary | ICD-10-CM | POA: Insufficient documentation

## 2016-12-05 DIAGNOSIS — R0602 Shortness of breath: Secondary | ICD-10-CM | POA: Diagnosis not present

## 2016-12-05 DIAGNOSIS — F419 Anxiety disorder, unspecified: Secondary | ICD-10-CM | POA: Diagnosis not present

## 2016-12-05 DIAGNOSIS — R0789 Other chest pain: Secondary | ICD-10-CM | POA: Diagnosis not present

## 2016-12-05 DIAGNOSIS — I10 Essential (primary) hypertension: Secondary | ICD-10-CM | POA: Insufficient documentation

## 2016-12-05 DIAGNOSIS — Z79899 Other long term (current) drug therapy: Secondary | ICD-10-CM | POA: Insufficient documentation

## 2016-12-05 LAB — COMPREHENSIVE METABOLIC PANEL
ALT: 28 U/L (ref 14–54)
AST: 35 U/L (ref 15–41)
Albumin: 3.7 g/dL (ref 3.5–5.0)
Alkaline Phosphatase: 106 U/L (ref 38–126)
Anion gap: 13 (ref 5–15)
BUN: 13 mg/dL (ref 6–20)
CO2: 21 mmol/L — ABNORMAL LOW (ref 22–32)
Calcium: 9.8 mg/dL (ref 8.9–10.3)
Chloride: 107 mmol/L (ref 101–111)
Creatinine, Ser: 0.98 mg/dL (ref 0.44–1.00)
GFR calc Af Amer: >60 mL/min (ref >60)
GFR calc non Af Amer: 55 mL/min — ABNORMAL LOW (ref >60)
Glucose, Bld: 130 mg/dL — ABNORMAL HIGH (ref 65–99)
Potassium: 3.3 mmol/L — ABNORMAL LOW (ref 3.5–5.1)
Sodium: 141 mmol/L (ref 135–145)
Total Bilirubin: 0.4 mg/dL (ref 0.3–1.2)
Total Protein: 8.1 g/dL (ref 6.5–8.1)

## 2016-12-05 LAB — CBC WITH DIFFERENTIAL/PLATELET
Basophils Absolute: 0.1 10*3/uL (ref 0.0–0.1)
Basophils Relative: 1 %
Eosinophils Absolute: 0.1 10*3/uL (ref 0.0–0.7)
Eosinophils Relative: 1 %
HCT: 41.6 % (ref 36.0–46.0)
Hemoglobin: 13.6 g/dL (ref 12.0–15.0)
Lymphocytes Relative: 34 %
Lymphs Abs: 1.9 10*3/uL (ref 0.7–4.0)
MCH: 29.2 pg (ref 26.0–34.0)
MCHC: 32.7 g/dL (ref 30.0–36.0)
MCV: 89.3 fL (ref 78.0–100.0)
Monocytes Absolute: 0.4 10*3/uL (ref 0.1–1.0)
Monocytes Relative: 6 %
Neutro Abs: 3.3 10*3/uL (ref 1.7–7.7)
Neutrophils Relative %: 58 %
Platelets: 642 10*3/uL — ABNORMAL HIGH (ref 150–400)
RBC: 4.66 MIL/uL (ref 3.87–5.11)
RDW: 12.9 % (ref 11.5–15.5)
WBC: 5.7 10*3/uL (ref 4.0–10.5)

## 2016-12-05 LAB — BRAIN NATRIURETIC PEPTIDE: B Natriuretic Peptide: 46 pg/mL (ref 0.0–100.0)

## 2016-12-05 LAB — I-STAT TROPONIN, ED: Troponin i, poc: 0 ng/mL (ref 0.00–0.08)

## 2016-12-05 LAB — D-DIMER, QUANTITATIVE (NOT AT ARMC): D-Dimer, Quant: 3.33 ug/mL-FEU — ABNORMAL HIGH (ref 0.00–0.50)

## 2016-12-05 MED ORDER — IOPAMIDOL (ISOVUE-370) INJECTION 76%
100.0000 mL | Freq: Once | INTRAVENOUS | Status: AC | PRN
  Administered 2016-12-05: 100 mL via INTRAVENOUS

## 2016-12-05 MED ORDER — LORAZEPAM 2 MG/ML IJ SOLN
0.5000 mg | Freq: Once | INTRAMUSCULAR | Status: AC
  Administered 2016-12-05: 0.5 mg via INTRAVENOUS
  Filled 2016-12-05: qty 1

## 2016-12-05 MED ORDER — LORAZEPAM 2 MG/ML IJ SOLN: 1.0000 mg | Freq: Once | INTRAMUSCULAR | Status: DC

## 2016-12-05 NOTE — ED Provider Notes (Signed)
[facility] DEPT Provider Note   CSN: [id number] Arrival date & time: 12/05/16  8546     History   Chief Complaint Chief Complaint  Patient presents with  . Shortness of Breath    HPI Audrey Johnson is a 76 y.o. female.  Patient complains of shortness of breath.   The history is provided by the patient. No language interpreter was used.  Shortness of Breath  This is a recurrent problem. The problem occurs frequently.The problem has not changed since onset.Pertinent negatives include no fever, no headaches, no cough, no chest pain, no abdominal pain and no rash. It is unknown what precipitated the problem. She has tried nothing for the symptoms.    Past Medical History:  Diagnosis Date  . Anxiety   . Arthritis    in back .   Marland Kitchen Borderline diabetes   . Chronic abdominal pain   . Chronic back pain   . Chronic constipation   . Chronic nausea   . GERD (gastroesophageal reflux disease)   . High cholesterol   . Hypertension   . Noncompliance with medication regimen   . Pneumonia   . Poor historian   . Shortness of breath     even walking causes sob...     Patient Active Problem List   Diagnosis Date Noted  . GERD (gastroesophageal reflux disease) 07/20/2013  . Abnormal LFTs 07/20/2013  . Constipation 07/20/2013  . Depression 04/24/2013  . Chronic abdominal pain 12/04/2012  . Dyspnea 05/19/2012  . Benign hypertension 05/19/2012  . Hyperlipidemia 05/19/2012    Past Surgical History:  Procedure Laterality Date  . ABDOMINAL HYSTERECTOMY    . BREAST SURGERY  unknown (many yrs)   excision  of lump right breast  was per pt "nothing"   . CARDIAC CATHETERIZATION    . CATARACT EXTRACTION W/PHACO Left 02/21/2015   Procedure: CATARACT EXTRACTION PHACO AND INTRAOCULAR LENS PLACEMENT LEFT EYE CDE=5.86;  Surgeon: Tonny Branch, MD;  Location: [facility];  Service: Ophthalmology;  Laterality: Left;  . CATARACT EXTRACTION W/PHACO Right 03/14/2015   Procedure: CATARACT  EXTRACTION PHACO AND INTRAOCULAR LENS PLACEMENT ([place]);  Surgeon: Tonny Branch, MD;  Location: [facility];  Service: Ophthalmology;  Laterality: Right;  CDE:6.36  . COLONOSCOPY  04/16/2007   RMR: Normal rectum and colon  . COLONOSCOPY WITH ESOPHAGOGASTRODUODENOSCOPY (EGD) N/A 01/08/2013   EVO:JJKK erosive reflux esophagitis. Negative H.pylori. Small hiatal hernia-s/p bx (gastritis)-TCS/Colonic polyp-hyperplastic  . DILATION AND CURETTAGE OF UTERUS    . LEFT HEART CATHETERIZATION WITH CORONARY ANGIOGRAM N/A 06/05/2012   Procedure: LEFT HEART CATHETERIZATION WITH CORONARY ANGIOGRAM;  Surgeon: Hillary Bow, MD;  Location: [facility] C[facility] LAB;  Service: Cardiovascular;  Laterality: N/A;  . TOE SURGERY Right    5th toe, not sure what was wrong with it    OB History    No data available       Home Medications    Prior to Admission medications   Medication Sig Start Date End Date Taking? Authorizing Provider  ALPRAZolam (XANAX) 0.25 MG tablet Take 1 tablet (0.25 mg total) by mouth 2 (two) times daily. 07/09/16  Yes Cloria Spring, MD  escitalopram (LEXAPRO) 10 MG tablet Take 1 tablet (10 mg total) by mouth daily. 07/09/16  Yes Cloria Spring, MD  gabapentin (NEURONTIN) 100 MG capsule Take 1 capsule (100 mg total) by mouth 3 (three) times daily. 07/09/16 07/09/17 Yes Cloria Spring, MD  lisinopril (PRINIVIL,ZESTRIL) 10 MG tablet Take 10 mg by mouth daily.  Yes [provider]  mirtazapine (REMERON) 7.5 MG tablet Take 7.5 mg by mouth at bedtime.   Yes [provider]  nebivolol (BYSTOLIC) 5 MG tablet Take 5 mg by mouth daily.   Yes [provider]  omeprazole (PRILOSEC) 20 MG capsule TAKE 2 CAPSULES BY MOUTH TWICE A DAY BEFORE MEALS 03/29/16  Yes Annitta Needs, NP  Probiotic Product (RESTORA) CAPS Take by mouth.   Yes [provider]  traMADol (ULTRAM) 50 MG tablet Take 1 tablet (50 mg total) by mouth every 6 (six) hours as needed. 11/28/16  Yes Milton Ferguson, MD    Family  History Family History  Problem Relation Age of Onset  . Colon cancer Neg Hx     Social History Social History  Substance Use Topics  . Smoking status: Never Smoker  . Smokeless tobacco: Never Used     Comment: Never smoked  . Alcohol use No     Allergies   Patient has no known allergies.   Review of Systems Review of Systems  Constitutional: Negative for appetite change, fatigue and fever.  HENT: Negative for congestion, ear discharge and sinus pressure.   Eyes: Negative for discharge.  Respiratory: Positive for shortness of breath. Negative for cough.   Cardiovascular: Negative for chest pain.  Gastrointestinal: Negative for abdominal pain and diarrhea.  Genitourinary: Negative for frequency and hematuria.  Musculoskeletal: Negative for back pain.  Skin: Negative for rash.  Neurological: Negative for seizures and headaches.  Psychiatric/Behavioral: Negative for hallucinations.     Physical Exam Updated Vital Signs BP 140/73   Pulse 73   Temp 98.3 F (36.8 C) (Oral)   Resp 19   Ht 5\' 7"  (1.702 m)   Wt 70.3 kg (155 lb)   SpO2 97%   BMI 24.28 kg/m   Physical Exam  Constitutional: She is oriented to person, place, and time. She appears well-developed.  HENT:  Head: Normocephalic.  Eyes: Conjunctivae and EOM are normal. No scleral icterus.  Neck: Neck supple. No thyromegaly present.  Cardiovascular: Normal rate and regular rhythm.  Exam reveals no gallop and no friction rub.   No murmur heard. Pulmonary/Chest: No stridor. She has no wheezes. She has no rales. She exhibits no tenderness.  Abdominal: She exhibits no distension. There is no tenderness. There is no rebound.  Musculoskeletal: Normal range of motion. She exhibits no edema.  Lymphadenopathy:    She has no cervical adenopathy.  Neurological: She is oriented to person, place, and time. She exhibits normal muscle tone. Coordination normal.  Skin: No rash noted. No erythema.  Psychiatric:  Anxiety       ED Treatments / Results  Labs (all labs ordered are listed, but only abnormal results are displayed) Labs Reviewed  CBC WITH DIFFERENTIAL/PLATELET - Abnormal; Notable for the following:       Result Value   Platelets 642 (*)    All other components within normal limits  COMPREHENSIVE METABOLIC PANEL - Abnormal; Notable for the following:    Potassium 3.3 (*)    CO2 21 (*)    Glucose, Bld 130 (*)    GFR calc non Af Amer 55 (*)    All other components within normal limits  D-DIMER, QUANTITATIVE (NOT AT [facility]) - Abnormal; Notable for the following:    D-Dimer, Quant 3.33 (*)    All other components within normal limits  BRAIN NATRIURETIC PEPTIDE  I-STAT TROPOININ, ED    EKG  EKG Interpretation  Date/Time:  Wednesday December 05 2016 07:26:29 EDT Ventricular Rate:  76 PR Interval:    QRS Duration: 113 QT Interval:  417 QTC Calculation: 469 R Axis:   54 Text Interpretation:  Sinus rhythm Borderline intraventricular conduction delay Confirmed by Naomee Nowland  MD, Corlene Sabia [phone]) on 12/05/2016 11:21:07 AM       Radiology Dg Chest 2 View  Result Date: 12/05/2016 CLINICAL DATA:  Shortness of breath, chest pain, decreased appetite, nausea vomiting and diarrhea. EXAM: CHEST  2 VIEW COMPARISON:  Chest x-ray of Nov 28, 2016 FINDINGS: The lungs are well-expanded with mild hemidiaphragm flattening. There is no focal infiltrate. There is no pleural effusion. The heart and mediastinal structures are normal. There is metallic density that projects over the left chest wall which was previously demonstrated. The bony structures exhibit no acute abnormality. IMPRESSION: Mild hyperinflation may be voluntary or may reflect underlying chronic bronchitis or reactive airway disease. No pneumonia nor CHF. Electronically Signed   By: David  Martinique M.D.   On: 12/05/2016 07:58   Ct Angio Chest Pe W Or Wo Contrast  Result Date: 12/05/2016 CLINICAL DATA:  Shortness of breath, chest pain EXAM: CT ANGIOGRAPHY CHEST  WITH CONTRAST TECHNIQUE: Multidetector CT imaging of the chest was performed using the standard protocol during bolus administration of intravenous contrast. Multiplanar CT image reconstructions and MIPs were obtained to evaluate the vascular anatomy. CONTRAST:  100 cc Isovue 370 IV COMPARISON:  Chest x-ray earlier today. FINDINGS: Cardiovascular: Heart is normal size. Aorta is normal caliber. No filling defects in the pulmonary arteries to suggest pulmonary emboli. Mediastinum/Nodes: No mediastinal, hilar, or axillary adenopathy. Trachea and esophagus are unremarkable. Lungs/Pleura: Lungs are clear. No focal airspace opacities or suspicious nodules. No effusions. Upper Abdomen: Imaging into the upper abdomen shows no acute findings. Musculoskeletal: Chest wall soft tissues are unremarkable. No acute bony abnormality. Review of the MIP images confirms the above findings. IMPRESSION: No evidence of pulmonary embolus.  No acute cardiopulmonary disease. Electronically Signed   By: Rolm Baptise M.D.   On: 12/05/2016 10:14    Procedures Procedures (including critical care time)  Medications Ordered in ED Medications  LORazepam (ATIVAN) injection 0.5 mg (0.5 mg Intravenous Given 12/05/16 0738)  iopamidol (ISOVUE-370) 76 % injection 100 mL (100 mLs Intravenous Contrast Given 12/05/16 0941)     Initial Impression / Assessment and Plan / ED Course  I have reviewed the triage vital signs and the nursing notes.  Pertinent labs & imaging results that were available during my care of the patient were reviewed by me and considered in my medical decision making (see chart for details).    Labs unremarkable except for elevated d-dimer. CT angiogram did not show any PE. Patient improved with Ativan. I think the shortness of breath is stress anxiety related. She has not been taking her Xanax. She has a half a milligram that she supposed to take twice a day. I told her daughter to increase it to 3 times a day if needed  and follow-up with her PCP  Final Clinical Impressions(s) / ED Diagnoses   Final diagnoses:  Pain  Anxiety    New Prescriptions Discharge Medication List as of 12/05/2016 11:51 AM       Milton Ferguson, MD 12/05/16 1226

## 2016-12-05 NOTE — ED Notes (Signed)
EPIC went down and was unable to have pt sign for d/c instructions.

## 2016-12-05 NOTE — ED Triage Notes (Signed)
EMS called out for SOB, upon arrival RR of 40, pt reported chest pain under left breast that increased with palpation, 156/84

## 2016-12-05 NOTE — ED Notes (Signed)
Pt is very anxious.  C/o epigastric burning, rating discomfort 6/10.

## 2016-12-05 NOTE — Telephone Encounter (Signed)
Phone call from the patient's daughter, who when asked if she was the patient, she said she was.   Since we do not have a release to communicate with the patient;s daughter, she had her mother call us.  When the patient called, she said she had been to ER at least two times, also today, she said she could not breathe good.   She said she is not working and she can't afford to pay when she comes.    She has missed the last two appointments.   She was offered a slot today, but she said she do not feel like coming today.   She has been scheduled for tomorrow, 12/06/16.

## 2016-12-05 NOTE — ED Notes (Signed)
Pt currently denies any pain at this time.

## 2016-12-05 NOTE — Discharge Instructions (Signed)
Increase your Xanax to 1 tablet every 8 hours as needed for shortness of breath anxiety or stress. Follow-up with your doctor in one-two weeks

## 2016-12-05 NOTE — ED Notes (Signed)
Pt is relaxed.  Not anxious.  Pt says she feels better following dose of Ativan.

## 2016-12-05 NOTE — Telephone Encounter (Signed)
ok 

## 2016-12-06 ENCOUNTER — Encounter (HOSPITAL_COMMUNITY): Payer: Self-pay | Admitting: Psychiatry

## 2016-12-06 ENCOUNTER — Ambulatory Visit (INDEPENDENT_AMBULATORY_CARE_PROVIDER_SITE_OTHER): Payer: Medicare HMO | Admitting: Psychiatry

## 2016-12-06 VITALS — BP 120/70 | HR 78 | Ht 67.0 in | Wt 155.0 lb

## 2016-12-06 DIAGNOSIS — F459 Somatoform disorder, unspecified: Secondary | ICD-10-CM

## 2016-12-06 DIAGNOSIS — F321 Major depressive disorder, single episode, moderate: Secondary | ICD-10-CM

## 2016-12-06 MED ORDER — ALPRAZOLAM 0.5 MG PO TABS: 0.5000 mg | ORAL_TABLET | Freq: Three times a day (TID) | ORAL | 2 refills | Status: DC

## 2016-12-06 MED ORDER — MIRTAZAPINE 15 MG PO TABS: 15.0000 mg | ORAL_TABLET | Freq: Every day | ORAL | 2 refills | Status: DC

## 2016-12-06 MED ORDER — GABAPENTIN 100 MG PO CAPS: 100.0000 mg | ORAL_CAPSULE | Freq: Three times a day (TID) | ORAL | 2 refills | Status: DC

## 2016-12-06 MED ORDER — ESCITALOPRAM OXALATE 10 MG PO TABS: 10.0000 mg | ORAL_TABLET | Freq: Every day | ORAL | 2 refills | Status: DC

## 2016-12-06 NOTE — Progress Notes (Signed)
Patient ID: KIORA HALLBERG, female   DOB: 07-26-1940, 76 y.o.   MRN: [id number] Patient ID: DUNIA PRINGLE, female   DOB: [birth year], 76 y.o.   MRN: [id number] Patient ID: AAYLIAH ROTENBERRY, female   DOB: 05-11-1941, 76 y.o.   MRN: [id number] Patient ID: RAVYNN HOGATE, female   DOB: 16-Mar-1941, 76 y.o.   MRN: [id number] Patient ID: MIRREN GEST, female   DOB: Sep 21, 1940, 76 y.o.   MRN: [id number] Patient ID: DYANA MAGNER, female   DOB: [birth year], 76 y.o.   MRN: [id number] Patient ID: STEFHANIE KACHMAR, female   DOB: [birth year], 76 y.o.   MRN: [id number] Patient ID: EVAMARIE RAETZ, female   DOB: 1941/07/02, 76 y.o.   MRN: [id number] Patient ID: KJIRSTEN BLOODGOOD, female   DOB: 1941/04/20, 76 y.o.   MRN: [id number] Patient ID: JENIKA CHIEM, female   DOB: 02/11/1941, 76 y.o.   MRN: [id number] Patient ID: CIEARA STIERWALT, female   DOB: 02-08-1941, 76 y.o.   MRN: [id number] Patient ID: IEESHA ABBASI, female   DOB: 1941/05/19, 76 y.o.   MRN: [id number] Patient ID: EVELETTE HOLLERN, female   DOB: [birth year], 76 y.o.   MRN: [id number] Patient ID: CHINYERE GALIANO, female   DOB: May 14, 1941, 76 y.o.   MRN: [id number]  Psychiatric Assessment Adult  Patient Identification:  STELLAR GENSEL Date of Evaluation:  12/06/2016 Chief Complaint: I feel dizzy during the day History of Chief Complaint:   Chief Complaint  Patient presents with  . Anxiety  . Depression  . Follow-up    Depression         Associated symptoms include appetite change.  Past medical history includes anxiety.   Anxiety     Altered Mental Status  Associated symptoms include joint swelling.   this patient is a 76 year old widowed black female who lives alonein [place].The patient used to work in a [employer] but has been retired for several years.  The patient was referred by Dr. Nevada Crane, her primary Dr. Apparently she's not had any history of mental health symptoms until about 2 months  ago. She started to have severe stomach pain and has gone to several emergency room this in the local area. She's had 9 emergency room visits in the last couple of months. Her last one was yesterday. She's had 3 abdominal CTs in the last month, and ultrasound and blood work. Nothing significantly wrong has been found.  The patient is on numerous medications for her stomach but they don't seem to help. She was prescribed Lexapro by her primary doctor but she refused to take it.  she is taking a low dose of Xanax  She returns after 5 months. She is here with her daughter [name]. She has not been doing well in the last few months. She's missed some appointments here. She has been compliant with her medications but has gotten increasingly anxious. Unfortunately she is getting some of the symptoms she is had in the past. She has a lot of abdominal pain and can't eat and has lost 20 pounds since January. She's been to the emergency room twice in the last week one time for chest wall pain and another time yesterday for severe anxiety. The low-dose Xanax doesn't seem to be helping much. Her primary doctor increased her mirtazapine to 15 mg. She is holding her stomach today and is rather distraught and seems quite anxious and has a difficult time focusing. She denies paranoia or auditory or visual  hallucinations. Her daughter mentioned that she was helping an elderly woman in her home but the woman died a few months ago and since then she's been in a bit of a decline Review of Systems  Constitutional: Positive for appetite change and unexpected weight change.  Musculoskeletal: Positive for back pain and joint swelling.  Psychiatric/Behavioral: Positive for depression.   Physical Exam not done  Depressive Symptoms: depressed mood, anhedonia, insomnia, psychomotor agitation, difficulty concentrating, hopelessness, suicidal thoughts with specific plan, anxiety, loss of energy/fatigue, weight loss,  (Hypo)  Manic Symptoms:   Elevated Mood:  No Irritable Mood:  Yes Grandiosity:  No Distractibility:  No Labiality of Mood:  Yes Delusions:  Yes Hallucinations:  Yes Impulsivity:  No Sexually Inappropriate Behavior:  No Financial Extravagance:  No Flight of Ideas:  No  Anxiety Symptoms: Excessive Worry:  Yes Panic Symptoms:  No Agoraphobia:  No Obsessive Compulsive: No  Symptoms: None, Specific Phobias:  No Social Anxiety:  No  Psychotic Symptoms:  Hallucinations: Yes Visual Delusions:  Yes Paranoia:  No   Ideas of Reference:  No  PTSD Symptoms: Ever had a traumatic exposure:  No Had a traumatic exposure in the last month:  No Re-experiencing: No None Hypervigilance:  No Hyperarousal: No None Avoidance: No None  Traumatic Brain Injury: No   Past Psychiatric History: Diagnosis: None   Hospitalizations: None   Outpatient Care: None   Substance Abuse Care: None   Self-Mutilation: None   Suicidal Attempts: None   Violent Behaviors: None    Past Medical History:   Past Medical History:  Diagnosis Date  . Anxiety   . Arthritis    in back .   Marland Kitchen Borderline diabetes   . Chronic abdominal pain   . Chronic back pain   . Chronic constipation   . Chronic nausea   . GERD (gastroesophageal reflux disease)   . High cholesterol   . Hypertension   . Noncompliance with medication regimen   . Pneumonia   . Poor historian   . Shortness of breath     even walking causes sob...    History of Loss of Consciousness:  No Seizure History:  No Cardiac History:  No Allergies:  No Known Allergies Current Medications:  Current Outpatient Prescriptions  Medication Sig Dispense Refill  . escitalopram (LEXAPRO) 10 MG tablet Take 1 tablet (10 mg total) by mouth daily. 90 tablet 2  . gabapentin (NEURONTIN) 100 MG capsule Take 1 capsule (100 mg total) by mouth 3 (three) times daily. 270 capsule 2  . lisinopril (PRINIVIL,ZESTRIL) 10 MG tablet Take 10 mg by mouth daily.    . nebivolol  (BYSTOLIC) 5 MG tablet Take 5 mg by mouth daily.    . traMADol (ULTRAM) 50 MG tablet Take 1 tablet (50 mg total) by mouth every 6 (six) hours as needed. 20 tablet 0  . ALPRAZolam (XANAX) 0.5 MG tablet Take 1 tablet (0.5 mg total) by mouth 3 (three) times daily. 90 tablet 2  . mirtazapine (REMERON) 15 MG tablet Take 1 tablet (15 mg total) by mouth at bedtime. 30 tablet 2   No current facility-administered medications for this visit.     Previous Psychotropic Medications:  Medication Dose   Xanax   0.25 mg every 8 hours as needed                      Substance Abuse History in the last 12 months: Substance Age of 1st Use Last Use Amount Specific  Type  Nicotine      Alcohol      Cannabis      Opiates      Cocaine      Methamphetamines      LSD      Ecstasy      Benzodiazepines      Caffeine      Inhalants      Others:                          Medical Consequences of Substance Abuse: n/a  Legal Consequences of Substance Abuse: n/a  Family Consequences of Substance Abuse: n/a  Blackouts:  No DT's:  No Withdrawal Symptoms:  No None  Social History: Current Place of Residence: [place] of Birth: Unknown Family Members: Lives with son Marital Status:  Widowed Children:   Sons: 1  Daughters: 1 Relationships:  Education:  [employer] Problems/Performance:  Religious Beliefs/Practices: Unknown History of Abuse: none Pensions consultant; Manufacturing engineer History:  None. Legal History: None Hobbies/Interests: Church  Family History:   Family History  Problem Relation Age of Onset  . Colon cancer Neg Hx     Mental Status Examination/Evaluation: Objective:  Appearance:   She is neatly dressedHead down poor eye contact   Eye Contact:: Poor   Speech:  Normal   Volume:  Decreased  Mood:Very anxious and depressed   Affect:  Constricted   Thought Process:  Organized   Orientation:  Full (Time, Place, and Person)   Thought Content:  No delusions or hallucinationsBut again overly focused on her stomach pain   Suicidal Thoughts:  No  Homicidal Thoughts:  No  Judgement:  Impaired  Insight:  Lacking  Psychomotor Activity:  Decreased   Akathisia:  No  Handed:  Right  AIMS (if indicated):    Assets:  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 depression, single episode with psychotic features and somatization disorder  AXIS I Major Depression, single episode with psychotic features, and somatization disorder   AXIS II Deferred  AXIS III Past Medical History:  Diagnosis Date  . Anxiety   . Arthritis    in back .   Marland Kitchen Borderline diabetes   . Chronic abdominal pain   . Chronic back pain   . Chronic constipation   . Chronic nausea   . GERD (gastroesophageal reflux disease)   . High cholesterol   . Hypertension   . Noncompliance with medication regimen   . Pneumonia   . Poor historian   . Shortness of breath     even walking causes sob...      AXIS IV other psychosocial or environmental problems  AXIS V 11-20 some danger of hurting self or others possible OR occasionally fails to maintain minimal personal hygiene OR gross impairment in communication   Treatment Plan/Recommendations:  Plan of Care: Medication management   Laboratory:    Psychotherapy: She is scheduled to see Maurice Small   Medications:  She will continue gabapentin 100 mg 3 times a day for anxiety and neuropathic pain. continue Lexapro 10 mg daily for depression and anxiety. She'll increase Xanax to 0.5 mg 3 times a day for anxiety. Her mirtazapine will be maintained at 15 mg at bedtime.   Routine PRN Medications:  No  Consultations:   Safety Concerns:  She denies  any thoughts or plans to hurt self or others   Other:  She'll return in 2 weeks but call sooner if her condition continues to deteriorate. I explained to the  patient and daughter we are trying to keep her from rehospitalization     [name], MD 6/7/20185:00 PM

## 2016-12-17 DIAGNOSIS — F411 Generalized anxiety disorder: Secondary | ICD-10-CM | POA: Diagnosis not present

## 2016-12-17 DIAGNOSIS — F339 Major depressive disorder, recurrent, unspecified: Secondary | ICD-10-CM | POA: Diagnosis not present

## 2016-12-17 DIAGNOSIS — Z6824 Body mass index (BMI) 24.0-24.9, adult: Secondary | ICD-10-CM | POA: Diagnosis not present

## 2016-12-17 DIAGNOSIS — R634 Abnormal weight loss: Secondary | ICD-10-CM | POA: Diagnosis not present

## 2016-12-17 DIAGNOSIS — E46 Unspecified protein-calorie malnutrition: Secondary | ICD-10-CM | POA: Diagnosis not present

## 2016-12-20 ENCOUNTER — Ambulatory Visit (INDEPENDENT_AMBULATORY_CARE_PROVIDER_SITE_OTHER): Payer: Medicare HMO | Admitting: Psychiatry

## 2016-12-20 ENCOUNTER — Encounter (HOSPITAL_COMMUNITY): Payer: Self-pay | Admitting: Psychiatry

## 2016-12-20 VITALS — BP 108/70 | HR 65 | Ht 67.0 in | Wt 156.0 lb

## 2016-12-20 DIAGNOSIS — F419 Anxiety disorder, unspecified: Secondary | ICD-10-CM

## 2016-12-20 DIAGNOSIS — G629 Polyneuropathy, unspecified: Secondary | ICD-10-CM | POA: Diagnosis not present

## 2016-12-20 DIAGNOSIS — F459 Somatoform disorder, unspecified: Secondary | ICD-10-CM

## 2016-12-20 DIAGNOSIS — Z79899 Other long term (current) drug therapy: Secondary | ICD-10-CM

## 2016-12-20 DIAGNOSIS — F321 Major depressive disorder, single episode, moderate: Secondary | ICD-10-CM

## 2016-12-20 DIAGNOSIS — F323 Major depressive disorder, single episode, severe with psychotic features: Secondary | ICD-10-CM

## 2016-12-20 NOTE — Progress Notes (Signed)
Patient ID: MAYBELLE DEPAOLI, female   DOB: January 11, 1941, 76 y.o.   MRN: [id number] Patient ID: LATINA FRANK, female   DOB: 1940/11/11, 76 y.o.   MRN: [id number] Patient ID: SRUTHI MAURER, female   DOB: 1940/10/24, 76 y.o.   MRN: [id number] Patient ID: RAY GLACKEN, female   DOB: 08/06/1940, 76 y.o.   MRN: [id number] Patient ID: CADIENCE BRADFIELD, female   DOB: [birth year], 76 y.o.   MRN: [id number] Patient ID: CARALYN TWINING, female   DOB: [birth year], 76 y.o.   MRN: [id number] Patient ID: RAVENNA LEGORE, female   DOB: 1940/08/22, 76 y.o.   MRN: [id number] Patient ID: MAKAILEY HODGKIN, female   DOB: 1941-01-30, 76 y.o.   MRN: [id number] Patient ID: CHARRON COULTAS, female   DOB: 03/16/1941, 76 y.o.   MRN: [id number] Patient ID: JAMARRIA REAL, female   DOB: 03-02-1941, 76 y.o.   MRN: [id number] Patient ID: ROSEALYNN MATEUS, female   DOB: [birth year], 76 y.o.   MRN: [id number] Patient ID: NAKEESHA BOWLER, female   DOB: 1940/08/14, 76 y.o.   MRN: [id number] Patient ID: SKILER TYE, female   DOB: 02-09-1941, 76 y.o.   MRN: [id number] Patient ID: SHERIDEN ARCHIBEQUE, female   DOB: 07/14/1940, 76 y.o.   MRN: [id number]  Psychiatric Assessment Adult  Patient Identification:  Audrey Johnson Date of Evaluation:  12/20/2016 Chief Complaint: I am still not eating History of Chief Complaint:   Chief Complaint  Patient presents with  . Anxiety  . Follow-up    Anxiety     Depression         Associated symptoms include appetite change.  Past medical history includes anxiety.   Altered Mental Status  Associated symptoms include joint swelling.   this patient is a 76 year old widowed black female who lives alonein [place].The patient used to work in a [employer] but has been retired for several years.  The patient was referred by Dr. Nevada Crane, her primary Dr. Apparently she's not had any history of mental health symptoms until about 2 months ago. She started to  have severe stomach pain and has gone to several emergency room this in the local area. She's had 9 emergency room visits in the last couple of months. Her last one was yesterday. She's had 3 abdominal CTs in the last month, and ultrasound and blood work. Nothing significantly wrong has been found.  The patient is on numerous medications for her stomach but they don't seem to help. She was prescribed Lexapro by her primary doctor but she refused to take it.  she is taking a low dose of Xanax  She returns after 3 weeks. Last time she felt sick and unable to eat. Since I saw her she's been to Dr. Nevada Crane her primary physician. He stopped her mirtazapine and she never started the Lexapro. She is now on Zoloft 25 mg daily. She states her anxiety is better and she is sleeping well but she still feels sick every time she tries to eat. He prescribed Megace but has to get prior authorization she states that this medicine helped her a lot in the past. The Xanax 0.5 mg is helping her anxiety. She denies any paranoia or delusions and I reassured her that she has not lost any weight in the last month Review of Systems  Constitutional: Positive for appetite change and unexpected weight change.  Musculoskeletal: Positive for back pain and joint swelling.  Psychiatric/Behavioral: Positive for depression.  Physical Exam not done  Depressive Symptoms: depressed mood, anhedonia, insomnia, psychomotor agitation, difficulty concentrating, hopelessness, suicidal thoughts with specific plan, anxiety, loss of energy/fatigue, weight loss,  (Hypo) Manic Symptoms:   Elevated Mood:  No Irritable Mood:  Yes Grandiosity:  No Distractibility:  No Labiality of Mood:  Yes Delusions:  Yes Hallucinations:  Yes Impulsivity:  No Sexually Inappropriate Behavior:  No Financial Extravagance:  No Flight of Ideas:  No  Anxiety Symptoms: Excessive Worry:  Yes Panic Symptoms:  No Agoraphobia:  No Obsessive Compulsive:  No  Symptoms: None, Specific Phobias:  No Social Anxiety:  No  Psychotic Symptoms:  Hallucinations: Yes Visual Delusions:  Yes Paranoia:  No   Ideas of Reference:  No  PTSD Symptoms: Ever had a traumatic exposure:  No Had a traumatic exposure in the last month:  No Re-experiencing: No None Hypervigilance:  No Hyperarousal: No None Avoidance: No None  Traumatic Brain Injury: No   Past Psychiatric History: Diagnosis: None   Hospitalizations: None   Outpatient Care: None   Substance Abuse Care: None   Self-Mutilation: None   Suicidal Attempts: None   Violent Behaviors: None    Past Medical History:   Past Medical History:  Diagnosis Date  . Anxiety   . Arthritis    in back .   Marland Kitchen Borderline diabetes   . Chronic abdominal pain   . Chronic back pain   . Chronic constipation   . Chronic nausea   . GERD (gastroesophageal reflux disease)   . High cholesterol   . Hypertension   . Noncompliance with medication regimen   . Pneumonia   . Poor historian   . Shortness of breath     even walking causes sob...    History of Loss of Consciousness:  No Seizure History:  No Cardiac History:  No Allergies:  No Known Allergies Current Medications:  Current Outpatient Prescriptions  Medication Sig Dispense Refill  . ALPRAZolam (XANAX) 0.5 MG tablet Take 1 tablet (0.5 mg total) by mouth 3 (three) times daily. 90 tablet 2  . gabapentin (NEURONTIN) 100 MG capsule Take 1 capsule (100 mg total) by mouth 3 (three) times daily. 270 capsule 2  . lisinopril (PRINIVIL,ZESTRIL) 10 MG tablet Take 10 mg by mouth daily.    . nebivolol (BYSTOLIC) 5 MG tablet Take 5 mg by mouth daily.    . traMADol (ULTRAM) 50 MG tablet Take 1 tablet (50 mg total) by mouth every 6 (six) hours as needed. 20 tablet 0  . sertraline (ZOLOFT) 25 MG tablet      No current facility-administered medications for this visit.     Previous Psychotropic Medications:  Medication Dose   Xanax   0.25 mg every 8 hours  as needed                      Substance Abuse History in the last 12 months: Substance Age of 1st Use Last Use Amount Specific Type  Nicotine      Alcohol      Cannabis      Opiates      Cocaine      Methamphetamines      LSD      Ecstasy      Benzodiazepines      Caffeine      Inhalants      Others:  Medical Consequences of Substance Abuse: n/a  Legal Consequences of Substance Abuse: n/a  Family Consequences of Substance Abuse: n/a  Blackouts:  No DT's:  No Withdrawal Symptoms:  No None  Social History: Current Place of Residence: [place] of Birth: Unknown Family Members: Lives with son Marital Status:  Widowed Children:   Sons: 1  Daughters: 1 Relationships:  Education:  [employer] Problems/Performance:  Religious Beliefs/Practices: Unknown History of Abuse: none Pensions consultant; Manufacturing engineer History:  None. Legal History: None Hobbies/Interests: Church  Family History:   Family History  Problem Relation Age of Onset  . Colon cancer Neg Hx     Mental Status Examination/Evaluation: Objective:  Appearance:   She is neatly dressed. 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 depression, single episode with psychotic features and  somatization disorder  AXIS I Major Depression, single episode with psychotic features, and somatization disorder   AXIS II Deferred  AXIS III Past Medical History:  Diagnosis Date  . Anxiety   . Arthritis    in back .   Marland Kitchen Borderline diabetes   . Chronic abdominal pain   . Chronic back pain   . Chronic constipation   . Chronic nausea   . GERD (gastroesophageal reflux disease)   . High cholesterol   . Hypertension   . Noncompliance with medication regimen   . Pneumonia   . Poor historian   . Shortness of breath     even walking causes sob...      AXIS IV other psychosocial or environmental problems  AXIS V 11-20 some danger of hurting self or others possible OR occasionally fails to maintain minimal personal hygiene OR gross impairment in communication   Treatment Plan/Recommendations:  Plan of Care: Medication management   Laboratory:    Psychotherapy: She is scheduled to see Maurice Small   Medications:  She will continue gabapentin 100 mg 3 times a day for anxiety and neuropathic pain. continue Zoloft 25 mg daily for depression and anxiety. She'll increase Xanax to 0.5 mg 3 times a day for anxiety.  Routine PRN Medications:  No  Consultations:   Safety Concerns:  She denies any thoughts or plans to hurt self or others   Other:  She'll return in 4 weeks but call sooner if her condition continues to deteriorate.     [name], MD 6/21/20184:02 PM

## 2017-01-03 ENCOUNTER — Other Ambulatory Visit (HOSPITAL_COMMUNITY): Payer: Self-pay | Admitting: Psychiatry

## 2017-01-03 ENCOUNTER — Encounter (HOSPITAL_COMMUNITY): Payer: Self-pay

## 2017-01-03 MED ORDER — SERTRALINE HCL 50 MG PO TABS: 50.0000 mg | ORAL_TABLET | Freq: Every day | ORAL | 2 refills | Status: DC

## 2017-01-03 NOTE — Telephone Encounter (Signed)
Still not having energy, Zoloft increased to 50 mg daily

## 2017-01-03 NOTE — Telephone Encounter (Signed)
Patient's daughter [name] calling requesting a call back from Dr Harrington Challenger concerning Mom's behavior & mood swings.  She states that on last visit one med was discontinued & another one added but her Mom she dtates she really doesn't know how to explain it -- But Mom is different. Contact # [name] [phone]

## 2017-01-10 DIAGNOSIS — E782 Mixed hyperlipidemia: Secondary | ICD-10-CM | POA: Diagnosis not present

## 2017-01-10 DIAGNOSIS — I10 Essential (primary) hypertension: Secondary | ICD-10-CM | POA: Diagnosis not present

## 2017-01-10 DIAGNOSIS — E1165 Type 2 diabetes mellitus with hyperglycemia: Secondary | ICD-10-CM | POA: Diagnosis not present

## 2017-01-14 ENCOUNTER — Other Ambulatory Visit: Payer: Self-pay

## 2017-01-14 ENCOUNTER — Ambulatory Visit (INDEPENDENT_AMBULATORY_CARE_PROVIDER_SITE_OTHER): Payer: Medicare HMO | Admitting: Nurse Practitioner

## 2017-01-14 ENCOUNTER — Other Ambulatory Visit (HOSPITAL_COMMUNITY)
Admission: RE | Admit: 2017-01-14 | Discharge: 2017-01-14 | Disposition: A | Discharge status: Home / Self Care | Payer: Medicare HMO | Source: Ambulatory Visit | Attending: Nurse Practitioner | Admitting: Nurse Practitioner

## 2017-01-14 ENCOUNTER — Ambulatory Visit (HOSPITAL_COMMUNITY)
Admission: RE | Admit: 2017-01-14 | Discharge: 2017-01-14 | Disposition: A | Discharge status: Home / Self Care | Payer: Medicare HMO | Source: Ambulatory Visit | Attending: Nurse Practitioner | Admitting: Nurse Practitioner

## 2017-01-14 ENCOUNTER — Encounter: Payer: Self-pay | Admitting: Nurse Practitioner

## 2017-01-14 VITALS — BP 104/66 | HR 86 | Temp 99.2°F | Ht 67.0 in | Wt 151.4 lb

## 2017-01-14 DIAGNOSIS — G8929 Other chronic pain: Secondary | ICD-10-CM

## 2017-01-14 DIAGNOSIS — K219 Gastro-esophageal reflux disease without esophagitis: Secondary | ICD-10-CM | POA: Diagnosis not present

## 2017-01-14 DIAGNOSIS — K449 Diaphragmatic hernia without obstruction or gangrene: Secondary | ICD-10-CM | POA: Diagnosis not present

## 2017-01-14 DIAGNOSIS — R109 Unspecified abdominal pain: Secondary | ICD-10-CM | POA: Insufficient documentation

## 2017-01-14 DIAGNOSIS — R112 Nausea with vomiting, unspecified: Secondary | ICD-10-CM | POA: Diagnosis not present

## 2017-01-14 DIAGNOSIS — K5900 Constipation, unspecified: Secondary | ICD-10-CM | POA: Insufficient documentation

## 2017-01-14 DIAGNOSIS — K429 Umbilical hernia without obstruction or gangrene: Secondary | ICD-10-CM | POA: Diagnosis not present

## 2017-01-14 DIAGNOSIS — M419 Scoliosis, unspecified: Secondary | ICD-10-CM | POA: Insufficient documentation

## 2017-01-14 DIAGNOSIS — R1110 Vomiting, unspecified: Secondary | ICD-10-CM | POA: Diagnosis not present

## 2017-01-14 LAB — COMPREHENSIVE METABOLIC PANEL
ALT: 89 U/L — ABNORMAL HIGH (ref 14–54)
AST: 40 U/L (ref 15–41)
Albumin: 4.1 g/dL (ref 3.5–5.0)
Alkaline Phosphatase: 101 U/L (ref 38–126)
Anion gap: 10 (ref 5–15)
BUN: 12 mg/dL (ref 6–20)
CO2: 20 mmol/L — ABNORMAL LOW (ref 22–32)
Calcium: 9.8 mg/dL (ref 8.9–10.3)
Chloride: 110 mmol/L (ref 101–111)
Creatinine, Ser: 0.94 mg/dL (ref 0.44–1.00)
GFR calc Af Amer: >60 mL/min (ref >60)
GFR calc non Af Amer: 58 mL/min — ABNORMAL LOW (ref >60)
Glucose, Bld: 105 mg/dL — ABNORMAL HIGH (ref 65–99)
Potassium: 3.9 mmol/L (ref 3.5–5.1)
Sodium: 140 mmol/L (ref 135–145)
Total Bilirubin: 0.8 mg/dL (ref 0.3–1.2)
Total Protein: 7.6 g/dL (ref 6.5–8.1)

## 2017-01-14 LAB — CBC WITH DIFFERENTIAL/PLATELET
Basophils Absolute: 0.1 10*3/uL (ref 0.0–0.1)
Basophils Relative: 1 %
Eosinophils Absolute: 0.1 10*3/uL (ref 0.0–0.7)
Eosinophils Relative: 1 %
HCT: 41.5 % (ref 36.0–46.0)
Hemoglobin: 13.9 g/dL (ref 12.0–15.0)
Lymphocytes Relative: 42 %
Lymphs Abs: 3.1 10*3/uL (ref 0.7–4.0)
MCH: 29 pg (ref 26.0–34.0)
MCHC: 33.5 g/dL (ref 30.0–36.0)
MCV: 86.6 fL (ref 78.0–100.0)
Monocytes Absolute: 0.4 10*3/uL (ref 0.1–1.0)
Monocytes Relative: 5 %
Neutro Abs: 3.9 10*3/uL (ref 1.7–7.7)
Neutrophils Relative %: 51 %
Platelets: 509 10*3/uL — ABNORMAL HIGH (ref 150–400)
RBC: 4.79 MIL/uL (ref 3.87–5.11)
RDW: 14.7 % (ref 11.5–15.5)
WBC: 7.5 10*3/uL (ref 4.0–10.5)

## 2017-01-14 LAB — LIPASE, BLOOD: Lipase: 26 U/L (ref 11–51)

## 2017-01-14 MED ORDER — OMEPRAZOLE 20 MG PO CPDR: 20.0000 mg | DELAYED_RELEASE_CAPSULE | Freq: Every day | ORAL | 1 refills | Status: DC

## 2017-01-14 MED ORDER — IOPAMIDOL (ISOVUE-300) INJECTION 61%
100.0000 mL | Freq: Once | INTRAVENOUS | Status: AC | PRN
  Administered 2017-01-14: 100 mL via INTRAVENOUS

## 2017-01-14 NOTE — Assessment & Plan Note (Signed)
Acute on chronic abdominal pain. Epigastric pain discussed above. She also describes a periumbilical pain. She seems to be in significant pain today. No acute abdomen on exam. She has significant worsening of tenderness to her periumbilical region. Denies alcohol use. I will check CBC, CMP, lipase all stat. Also check a stat CT of the abdomen and pelvis. Pancreatitis is in the differentials given her nausea, significant pain that keeps her awake at night. She does have an element of chronic pain, although this appears to be much worse today. Return for follow-up in 2 weeks.

## 2017-01-14 NOTE — Assessment & Plan Note (Addendum)
The patient has a history of GERD and documented esophagitis on upper endoscopy. Has a history of H. pylori status post eradication. At some point in the last 2 years her PPI was discontinued. She describes epigastric pain, in addition to her periumbilical pain, which is described as cramping in nature. No other dyspepsia symptoms. She seems quite uncomfortable today. Worsening pain with palpation. Given her history and presentation differentials include esophagitis, gastritis, gastric ulcer, duodenitis, duodenal ulcer. Less likely upper GI cancerous process. At this point given her pain, as well as her periumbilical pain (as discussed below) I will check labs including CBC, CMP, lipase all stat as well as a CT of the abdomen and pelvis, also stat. I will also schedule her for upper endoscopy to further evaluate her acute on chronic symptoms. Return for follow-up in 2 weeks. ER precautions given.  Proceed with EGD on propofol/MAC with Dr. Gala Romney in near future: the risks, benefits, and alternatives have been discussed with the patient in detail. The patient states understanding and desires to proceed.  The patient is currently on Neurontin, Xanax, Zoloft. No other anticoagulants, anxiolytics, chronic pain medications, or antidepressants. We will plan for the procedure on propofol/MAC to promote adequate sedation.

## 2017-01-14 NOTE — Patient Instructions (Signed)
1. Have your labs drawn when you leave our office. 2. Our office will help schedule your CAT scan. 3. When you complete the stool test, bring it back to our office. 4. I have sent in Prilosec to your pharmacy. Start taking this today. 5. Return for follow-up in 2 weeks. 6. We will schedule your upper endoscopy for you. 7. Call us if you have any questions or concerns, or worsening/severe symptoms.

## 2017-01-14 NOTE — Progress Notes (Signed)
Referring Provider: Celene Squibb, MD Primary Care Physician:  Celene Squibb, MD Primary GI:  Dr. Gala Romney  Chief Complaint  Patient presents with  . Abdominal Pain    x2 months; around navel  . Constipation    HPI:   Audrey Johnson is a 76 y.o. female who presents For abdominal pain lasting 2 months as well as constipation. The patient was last seen in our office on 03/31/2015 for GERD, constipation, chronic abdominal pain. At that time it was noted she had extensive evaluation including CT scan negative for mesenteric ischemia in June 2014, EGD and colonoscopy, multiple CT exams, MRI of the spine. Insignificant findings related to GI. Her constipation initially responded the Linzess but she stopped taking it. At her last visit she noted to be doing well with intermittent left-sided abdominal pain worse with tight close. Associated occasional nausea without vomiting. Noted decrease in appetite and subjective weight loss, although her objective weight loss was stable. No other GI symptoms. Evaluation of her abdominal pain felt to be due to either constipation or abdominal wall pain. Recommended pain management referral in follow-up in one year. Constipation overall well controlled as well as her GERD symptoms.  Today she states she's not feeling well. Her constipation seems well managed as she states she has a bowel movement 1-2 times a day, stools are soft and pass easily. No sensation of incomplete emptying. Abdominal pain started about 2 months ago. Pain worse before eating and no improvement after eating. No other aggravating factors. Sometimes pain improves temporarily after eating. Denies GERD symptoms. Admits epigastric pain described as cramping. Pain at her naval "just pain, just hurts" and unable to describe. Her pain makes it difficult to sleep. Has nausea, no vomiting. Denies hematochezia, admits occasional black stools. Has some dizziness, dyspnea. No syncope. Has some bloating.  Denies any other upper or lower GI symptoms.  Took Aleve frequently without improvement (last dose 7+ days ago). Denies ASA powders.  Past Medical History:  Diagnosis Date  . Anxiety   . Arthritis    in back .   Marland Kitchen Borderline diabetes   . Chronic abdominal pain   . Chronic back pain   . Chronic constipation   . Chronic nausea   . GERD (gastroesophageal reflux disease)   . High cholesterol   . Hypertension   . Noncompliance with medication regimen   . Pneumonia   . Poor historian   . Shortness of breath     even walking causes sob...     Past Surgical History:  Procedure Laterality Date  . ABDOMINAL HYSTERECTOMY    . BREAST SURGERY  unknown (many yrs)   excision  of lump right breast  was per pt "nothing"   . CARDIAC CATHETERIZATION    . CATARACT EXTRACTION W/PHACO Left 02/21/2015   Procedure: CATARACT EXTRACTION PHACO AND INTRAOCULAR LENS PLACEMENT LEFT EYE CDE=5.86;  Surgeon: Tonny Branch, MD;  Location: [facility];  Service: Ophthalmology;  Laterality: Left;  . CATARACT EXTRACTION W/PHACO Right 03/14/2015   Procedure: CATARACT EXTRACTION PHACO AND INTRAOCULAR LENS PLACEMENT ([place]);  Surgeon: Tonny Branch, MD;  Location: [facility];  Service: Ophthalmology;  Laterality: Right;  CDE:6.36  . COLONOSCOPY  04/16/2007   RMR: Normal rectum and colon  . COLONOSCOPY WITH ESOPHAGOGASTRODUODENOSCOPY (EGD) N/A 01/08/2013   SWF:UXNA erosive reflux esophagitis. Negative H.pylori. Small hiatal hernia-s/p bx (gastritis)-TCS/Colonic polyp-hyperplastic  . DILATION AND CURETTAGE OF UTERUS    . LEFT HEART CATHETERIZATION WITH CORONARY ANGIOGRAM N/A  06/05/2012   Procedure: LEFT HEART CATHETERIZATION WITH CORONARY ANGIOGRAM;  Surgeon: Hillary Bow, MD;  [facility] C[facility];  Service: Cardiovascular;  Laterality: N/A;  . TOE SURGERY Right    5th toe, not sure what was wrong with it    Current Outpatient Prescriptions  Medication Sig Dispense Refill  . ALPRAZolam (XANAX) 0.5 MG tablet Take 1 tablet (0.5  mg total) by mouth 3 (three) times daily. 90 tablet 2  . gabapentin (NEURONTIN) 100 MG capsule Take 1 capsule (100 mg total) by mouth 3 (three) times daily. 270 capsule 2  . lisinopril (PRINIVIL,ZESTRIL) 10 MG tablet Take 10 mg by mouth daily.    . nebivolol (BYSTOLIC) 5 MG tablet Take 5 mg by mouth daily.    . sertraline (ZOLOFT) 50 MG tablet Take 1 tablet (50 mg total) by mouth daily. 30 tablet 2   No current facility-administered medications for this visit.     Allergies as of 01/14/2017  . (No Known Allergies)    Family History  Problem Relation Age of Onset  . Colon cancer Neg Hx     Social History   Social History  . Marital status: Widowed    Spouse name: N/A  . Number of children: N/A  . Years of education: N/A   Social History Main Topics  . Smoking status: Never Smoker  . Smokeless tobacco: Never Used     Comment: Never smoked  . Alcohol use No  . Drug use: No  . Sexual activity: No   Other Topics Concern  . None   Social History Narrative  . None    Review of Systems: General: Negative for anorexia, weight loss, fever, chills, fatigue, weakness. Eyes: Negative for vision changes.  ENT: Negative for hoarseness, difficulty swallowing , nasal congestion. CV: Negative for chest pain, angina, palpitations, dyspnea on exertion, peripheral edema.  Respiratory: Negative for dyspnea at rest, dyspnea on exertion, cough, sputum, wheezing.  GI: See history of present illness. GU:  Negative for dysuria, hematuria, urinary incontinence, urinary frequency, nocturnal urination.  MS: Negative for joint pain, low back pain.  Derm: Negative for rash or itching.  Neuro: Negative for weakness, abnormal sensation, seizure, frequent headaches, memory loss, confusion.  Psych: Negative for anxiety, depression, suicidal ideation, hallucinations.  Endo: Negative for unusual weight change.  Heme: Negative for bruising or bleeding. Allergy: Negative for rash or  hives.   Physical Exam: BP 104/66   Pulse 86   Temp 99.2 F (37.3 C) (Oral)   Ht 5\' 7"  (1.702 m)   Wt 151 lb 6.4 oz (68.7 kg)   BMI 23.71 kg/m  General:   Alert and oriented. Pleasant and cooperative. Well-nourished and well-developed.  Head:  Normocephalic and atraumatic. Eyes:  Without icterus, sclera clear and conjunctiva pink.  Ears:  Normal auditory acuity. Mouth:  No deformity or lesions, oral mucosa pink.  Throat/Neck:  Supple, without mass or thyromegaly. Cardiovascular:  S1, S2 present without murmurs appreciated. Normal pulses noted. Extremities without clubbing or edema. Respiratory:  Clear to auscultation bilaterally. No wheezes, rales, or rhonchi. No distress.  Gastrointestinal:  +BS, soft, non-tender and non-distended. No HSM noted. No guarding or rebound. No masses appreciated.  Rectal:  Deferred  Musculoskalatal:  Symmetrical without gross deformities. Normal posture. Skin:  Intact without significant lesions or rashes. Neurologic:  Alert and oriented x4;  grossly normal neurologically. Psych:  Alert and cooperative. Normal mood and affect. Heme/Lymph/Immune: No significant cervical adenopathy. No excessive bruising noted.  01/14/2017 11:54 AM   Disclaimer: This note was dictated with voice recognition software. Similar sounding words can inadvertently be transcribed and may not be corrected upon review.

## 2017-01-14 NOTE — Progress Notes (Signed)
CC'D TO PCP °

## 2017-01-14 NOTE — Progress Notes (Signed)
Called pt, got recording that pt is unavailable now. To please try the call later.

## 2017-01-14 NOTE — Patient Instructions (Signed)
PA info for CT abd/pelvis w/contrast submitted via website for [facility]. PA# [id number], 01/14/17-02/13/17.

## 2017-01-14 NOTE — Progress Notes (Signed)
Called and got recording that pt is unavailable now and to try call later.

## 2017-01-14 NOTE — Assessment & Plan Note (Signed)
Constipation seems well-controlled with 1-2 soft bowel movements a day without noted sensation of incomplete emptying, no hematochezia. States her stools are occasionally dark. I will check an iFob for blood. I will also check labs as per above. Seems unlikely that constipation is a significant contributor to her symptoms today.

## 2017-01-15 ENCOUNTER — Telehealth: Payer: Self-pay

## 2017-01-15 NOTE — Progress Notes (Signed)
PT is aware of results and plan.  

## 2017-01-15 NOTE — Progress Notes (Signed)
I had to call daughter who is listed on emergency contact and get pt's updated phone number of [phone]. Pt is aware of all of her results and plan. Said she is about like she was but will go to ED if worsens.

## 2017-01-15 NOTE — Telephone Encounter (Signed)
Called and informed pt of pre-op appt 02/11/17 at 12:45pm. Letter also mailed.

## 2017-01-17 ENCOUNTER — Encounter (HOSPITAL_COMMUNITY): Payer: Self-pay | Admitting: Psychiatry

## 2017-01-17 ENCOUNTER — Ambulatory Visit (INDEPENDENT_AMBULATORY_CARE_PROVIDER_SITE_OTHER): Payer: Medicare HMO | Admitting: Psychiatry

## 2017-01-17 VITALS — BP 113/65 | HR 74 | Ht 67.0 in | Wt 152.8 lb

## 2017-01-17 DIAGNOSIS — F321 Major depressive disorder, single episode, moderate: Secondary | ICD-10-CM | POA: Diagnosis not present

## 2017-01-17 DIAGNOSIS — F459 Somatoform disorder, unspecified: Secondary | ICD-10-CM | POA: Diagnosis not present

## 2017-01-17 MED ORDER — SERTRALINE HCL 50 MG PO TABS: 50.0000 mg | ORAL_TABLET | Freq: Every day | ORAL | 2 refills | Status: DC

## 2017-01-17 MED ORDER — MIRTAZAPINE 15 MG PO TABS: 15.0000 mg | ORAL_TABLET | Freq: Every day | ORAL | 2 refills | Status: DC

## 2017-01-17 NOTE — Progress Notes (Signed)
Patient ID: Audrey Johnson, female   DOB: February 05, 1941, 76 y.o.   MRN: [id number] Patient ID: Audrey Johnson, female   DOB: Sep 16, 1940, 76 y.o.   MRN: [id number] Patient ID: Audrey Johnson, female   DOB: Nov 13, 1940, 76 y.o.   MRN: [id number] Patient ID: Audrey Johnson, female   DOB: 12-Dec-1940, 77 y.o.   MRN: [id number] Patient ID: Audrey Johnson, female   DOB: 08-17-1940, 76 y.o.   MRN: [id number] Patient ID: Audrey Johnson, female   DOB: 10-Oct-1940, 76 y.o.   MRN: [id number] Patient ID: Audrey Johnson, female   DOB: [birth year], 76 y.o.   MRN: [id number] Patient ID: Audrey Johnson, female   DOB: August 20, 1940, 76 y.o.   MRN: [id number] Patient ID: Audrey Johnson, female   DOB: 06/14/1941, 76 y.o.   MRN: [id number] Patient ID: Audrey Johnson, female   DOB: [birth year], 76 y.o.   MRN: [id number] Patient ID: Audrey Johnson, female   DOB: 09/19/1940, 76 y.o.   MRN: [id number] Patient ID: ARTURO FREUNDLICH, female   DOB: 08/09/1940, 76 y.o.   MRN: [id number] Patient ID: Audrey Johnson, female   DOB: 04-24-1941, 76 y.o.   MRN: [id number] Patient ID: Audrey Johnson, female   DOB: [birth year], 76 y.o.   MRN: [id number]  Psychiatric Assessment Adult  Patient Identification:  Audrey Johnson Date of Evaluation:  01/17/2017 Chief Complaint: I am still not eating History of Chief Complaint:   Chief Complaint  Patient presents with  . Depression  . Anxiety  . Follow-up    Anxiety     Depression         Associated symptoms include appetite change.  Past medical history includes anxiety.   Altered Mental Status  Associated symptoms include joint swelling.   this patient is a 76 year old widowed black female who lives alonein [place].The patient used to work in a [employer] but has been retired for several years.  The patient was referred by Dr. Nevada Crane, her primary Dr. Apparently she's not had any history of mental health symptoms until about 2 months ago.  She started to have severe stomach pain and has gone to several emergency room this in the local area. She's had 9 emergency room visits in the last couple of months. Her last one was yesterday. She's had 3 abdominal CTs in the last month, and ultrasound and blood work. Nothing significantly wrong has been found.  The patient is on numerous medications for her stomach but they don't seem to help. She was prescribed Lexapro by her primary doctor but she refused to take it.  she is taking a low dose of Xanax  She returns after 3 weeks. Last time she felt sick and unable to eat. Since then she's had abdominal and chest scans which only 6 showed a small hernia. She is scheduled to have an upper endoscopy. She's now on Megace and eating a little better but she has lost weight. She states that she only eats enough to take her medications. She denies being depressed and feels like her stomach so little better today. She's been put back on protonix this week and perhaps it's starting to help. She is now not sleeping and I think we need to put her back on mirtazapine so we'll go ahead and do that today. Review of Systems  Constitutional: Positive for appetite change and unexpected weight change.  Musculoskeletal: Positive for back pain and joint swelling.  Psychiatric/Behavioral: Positive for depression.  Physical Exam not done  Depressive Symptoms: depressed mood, anhedonia, insomnia, psychomotor agitation, difficulty concentrating, hopelessness, suicidal thoughts with specific plan, anxiety, loss of energy/fatigue, weight loss,  (Hypo) Manic Symptoms:   Elevated Mood:  No Irritable Mood:  Yes Grandiosity:  No Distractibility:  No Labiality of Mood:  Yes Delusions:  Yes Hallucinations:  Yes Impulsivity:  No Sexually Inappropriate Behavior:  No Financial Extravagance:  No Flight of Ideas:  No  Anxiety Symptoms: Excessive Worry:  Yes Panic Symptoms:  No Agoraphobia:  No Obsessive  Compulsive: No  Symptoms: None, Specific Phobias:  No Social Anxiety:  No  Psychotic Symptoms:  Hallucinations: Yes Visual Delusions:  Yes Paranoia:  No   Ideas of Reference:  No  PTSD Symptoms: Ever had a traumatic exposure:  No Had a traumatic exposure in the last month:  No Re-experiencing: No None Hypervigilance:  No Hyperarousal: No None Avoidance: No None  Traumatic Brain Injury: No   Past Psychiatric History: Diagnosis: None   Hospitalizations: None   Outpatient Care: None   Substance Abuse Care: None   Self-Mutilation: None   Suicidal Attempts: None   Violent Behaviors: None    Past Medical History:   Past Medical History:  Diagnosis Date  . Anxiety   . Arthritis    in back .   Marland Kitchen Borderline diabetes   . Chronic abdominal pain   . Chronic back pain   . Chronic constipation   . Chronic nausea   . GERD (gastroesophageal reflux disease)   . High cholesterol   . Hypertension   . Noncompliance with medication regimen   . Pneumonia   . Poor historian   . Shortness of breath     even walking causes sob...    History of Loss of Consciousness:  No Seizure History:  No Cardiac History:  No Allergies:  No Known Allergies Current Medications:  Current Outpatient Prescriptions  Medication Sig Dispense Refill  . ALPRAZolam (XANAX) 0.5 MG tablet Take 1 tablet (0.5 mg total) by mouth 3 (three) times daily. 90 tablet 2  . gabapentin (NEURONTIN) 100 MG capsule Take 1 capsule (100 mg total) by mouth 3 (three) times daily. 270 capsule 2  . lisinopril (PRINIVIL,ZESTRIL) 10 MG tablet Take 10 mg by mouth daily.    . nebivolol (BYSTOLIC) 5 MG tablet Take 5 mg by mouth daily.    Marland Kitchen omeprazole (PRILOSEC) 20 MG capsule Take 1 capsule (20 mg total) by mouth daily. 90 capsule 1  . sertraline (ZOLOFT) 50 MG tablet Take 1 tablet (50 mg total) by mouth daily. 30 tablet 2  . mirtazapine (REMERON) 15 MG tablet Take 1 tablet (15 mg total) by mouth at bedtime. 30 tablet 2   No  current facility-administered medications for this visit.     Previous Psychotropic Medications:  Medication Dose   Xanax   0.25 mg every 8 hours as needed                      Substance Abuse History in the last 12 months: Substance Age of 1st Use Last Use Amount Specific Type  Nicotine      Alcohol      Cannabis      Opiates      Cocaine      Methamphetamines      LSD      Ecstasy      Benzodiazepines      Caffeine      Inhalants  Others:                          Medical Consequences of Substance Abuse: n/a  Legal Consequences of Substance Abuse: n/a  Family Consequences of Substance Abuse: n/a  Blackouts:  No DT's:  No Withdrawal Symptoms:  No None  Social History: Current Place of Residence: [place] of Birth: Unknown Family Members: Lives with son Marital Status:  Widowed Children:   Sons: 1  Daughters: 1 Relationships:  Education:  [employer] Problems/Performance:  Religious Beliefs/Practices: Unknown History of Abuse: none Pensions consultant; Manufacturing engineer History:  None. Legal History: None Hobbies/Interests: Church  Family History:   Family History  Problem Relation Age of Onset  . Colon cancer Neg Hx     Mental Status Examination/Evaluation: Objective:  Appearance:   She is neatly dressed.   Eye Contact:: Poor   Speech:  Normal   Volume:  Decreased  Mood:Early good less anxious   Affect:  Constricted   Thought Process:  Organized   Orientation:  Full (Time, Place, and Person)  Thought Content:  No delusions or hallucinationsBut again overly focused on her stomach pain   Suicidal Thoughts:  No  Homicidal Thoughts:  No  Judgement:  Impaired  Insight:  Lacking  Psychomotor Activity:  Decreased   Akathisia:  No  Handed:  Right  AIMS (if indicated):    Assets:  Social Support  Fund of knowledge memory and language are all good as well  Event organiser)        Assessment:  Maj. depression, single episode with psychotic features and somatization disorder  AXIS I Major Depression, single episode with psychotic features, and somatization disorder   AXIS II Deferred  AXIS III Past Medical History:  Diagnosis Date  . Anxiety   . Arthritis    in back .   Marland Kitchen Borderline diabetes   . Chronic abdominal pain   . Chronic back pain   . Chronic constipation   . Chronic nausea   . GERD (gastroesophageal reflux disease)   . High cholesterol   . Hypertension   . Noncompliance with medication regimen   . Pneumonia   . Poor historian   . Shortness of breath     even walking causes sob...      AXIS IV other psychosocial or environmental problems  AXIS V 11-20 some danger of hurting self or others possible OR occasionally fails to maintain minimal personal hygiene OR gross impairment in communication   Treatment Plan/Recommendations:  Plan of Care: Medication management   Laboratory:    Psychotherapy: She is scheduled to see Maurice Small   Medications:  She will continue gabapentin 100 mg 3 times a day for anxiety and neuropathic pain. continue Zoloft 50 mg daily for depression and anxiety. She'll continue Xanax to 0.5 mg 3 times a day for anxiety. mirtazapine 15 mg will be added at bedtime for sleep and appetite   Routine PRN Medications:  No  Consultations:   Safety Concerns:  She denies any thoughts or plans to hurt self or others   Other:  She'll return in 6 weeks but call sooner if her condition continues to deteriorate.     [name], MD 7/19/20182:43 PM

## 2017-01-21 ENCOUNTER — Ambulatory Visit (INDEPENDENT_AMBULATORY_CARE_PROVIDER_SITE_OTHER): Payer: Medicare HMO

## 2017-01-21 DIAGNOSIS — N182 Chronic kidney disease, stage 2 (mild): Secondary | ICD-10-CM | POA: Diagnosis not present

## 2017-01-21 DIAGNOSIS — D696 Thrombocytopenia, unspecified: Secondary | ICD-10-CM | POA: Diagnosis not present

## 2017-01-21 DIAGNOSIS — E1165 Type 2 diabetes mellitus with hyperglycemia: Secondary | ICD-10-CM | POA: Diagnosis not present

## 2017-01-21 DIAGNOSIS — K921 Melena: Secondary | ICD-10-CM | POA: Diagnosis not present

## 2017-01-21 DIAGNOSIS — I10 Essential (primary) hypertension: Secondary | ICD-10-CM | POA: Diagnosis not present

## 2017-01-21 DIAGNOSIS — F411 Generalized anxiety disorder: Secondary | ICD-10-CM | POA: Diagnosis not present

## 2017-01-21 DIAGNOSIS — R630 Anorexia: Secondary | ICD-10-CM | POA: Diagnosis not present

## 2017-01-21 DIAGNOSIS — R109 Unspecified abdominal pain: Secondary | ICD-10-CM | POA: Diagnosis not present

## 2017-01-21 DIAGNOSIS — Z6824 Body mass index (BMI) 24.0-24.9, adult: Secondary | ICD-10-CM | POA: Diagnosis not present

## 2017-01-21 DIAGNOSIS — E782 Mixed hyperlipidemia: Secondary | ICD-10-CM | POA: Diagnosis not present

## 2017-01-21 LAB — IFOBT (OCCULT BLOOD): IFOBT: NEGATIVE

## 2017-01-30 ENCOUNTER — Telehealth: Payer: Self-pay | Admitting: Orthopedic Surgery

## 2017-01-30 NOTE — Telephone Encounter (Signed)
Patient called and canceled her appointment for the 27th.  She did not want to wait to cancel this appointment.  She said she had too many other appointments.  She states she has been sick for 4 months.

## 2017-02-08 ENCOUNTER — Ambulatory Visit (INDEPENDENT_AMBULATORY_CARE_PROVIDER_SITE_OTHER): Payer: Medicare HMO | Admitting: Gastroenterology

## 2017-02-08 ENCOUNTER — Other Ambulatory Visit (HOSPITAL_COMMUNITY)
Admission: RE | Admit: 2017-02-08 | Discharge: 2017-02-08 | Disposition: A | Discharge status: Home / Self Care | Payer: Medicare HMO | Source: Ambulatory Visit | Attending: Gastroenterology | Admitting: Gastroenterology

## 2017-02-08 ENCOUNTER — Ambulatory Visit: Payer: Medicare HMO | Admitting: Nurse Practitioner

## 2017-02-08 VITALS — BP 122/63 | HR 87 | Temp 98.9°F | Ht 67.0 in | Wt 151.8 lb

## 2017-02-08 DIAGNOSIS — R109 Unspecified abdominal pain: Secondary | ICD-10-CM

## 2017-02-08 DIAGNOSIS — G8929 Other chronic pain: Secondary | ICD-10-CM | POA: Diagnosis not present

## 2017-02-08 DIAGNOSIS — K5900 Constipation, unspecified: Secondary | ICD-10-CM | POA: Diagnosis not present

## 2017-02-08 LAB — COMPREHENSIVE METABOLIC PANEL
ALT: 89 U/L — ABNORMAL HIGH (ref 14–54)
AST: 54 U/L — ABNORMAL HIGH (ref 15–41)
Albumin: 4.1 g/dL (ref 3.5–5.0)
Alkaline Phosphatase: 97 U/L (ref 38–126)
Anion gap: 8 (ref 5–15)
BUN: 13 mg/dL (ref 6–20)
CO2: 21 mmol/L — ABNORMAL LOW (ref 22–32)
Calcium: 9.5 mg/dL (ref 8.9–10.3)
Chloride: 111 mmol/L (ref 101–111)
Creatinine, Ser: 0.97 mg/dL (ref 0.44–1.00)
GFR calc Af Amer: >60 mL/min (ref >60)
GFR calc non Af Amer: 56 mL/min — ABNORMAL LOW (ref >60)
Glucose, Bld: 101 mg/dL — ABNORMAL HIGH (ref 65–99)
Potassium: 3.8 mmol/L (ref 3.5–5.1)
Sodium: 140 mmol/L (ref 135–145)
Total Bilirubin: 0.8 mg/dL (ref 0.3–1.2)
Total Protein: 8 g/dL (ref 6.5–8.1)

## 2017-02-08 NOTE — Progress Notes (Signed)
Referring Provider: Celene Squibb, MD Primary Care Physician:  Celene Squibb, MD Primary GI: Dr. Gala Romney   Chief Complaint  Patient presents with  . Abdominal Pain    HPI:   Audrey Johnson is a 76 y.o. female presenting today with a history of chronic abdominal pain, GERD, constipation. Last seen July 2018 and arranged to have EGD with Propofol due to epigastric pain that was acute on chronic. Due to severity of pain in July, stat labs and CT were ordered. She had reported possible black stools intermittently, BUT HEME NEGATIVE. Lipase, CBC unrevealing. CMP showed ALT elevated at 89, otherwise normal LFTs. CT with contrast July 2018 without acute abnormality, small sliding hiatal hernia noted.   Extensive prior evaluation in past due to chronic abdominal pain with a CT inJune 2014 reviewed with Dr. Thornton Papas with celiac, SMA, and IMA patent. Last EGD in 2014 with esophagitis, negative H.pylori. Colonoscopy 2014 with hyperplastic polyp. Recommended pain management in the past. She has not had a CTA. She has had multiple CTs in the past. I do note that she has weight loss over the last year of 16 lbs.   Notes pain in lower abdomen after having BM. Drinking/eating causes pain periumbilically. Today tells me no black stools, just dark. Taking Prilosec each morning. Feels constipated. Not on anything for it. Can't eat if doesn't have Megace.   Past Medical History:  Diagnosis Date  . Anxiety   . Arthritis    in back .   Marland Kitchen Borderline diabetes   . Chronic abdominal pain   . Chronic back pain   . Chronic constipation   . Chronic nausea   . GERD (gastroesophageal reflux disease)   . High cholesterol   . Hypertension   . Noncompliance with medication regimen   . Pneumonia   . Poor historian   . Shortness of breath     even walking causes sob...     Past Surgical History:  Procedure Laterality Date  . ABDOMINAL HYSTERECTOMY    . BREAST SURGERY  unknown (many yrs)   excision  of lump  right breast  was per pt "nothing"   . CARDIAC CATHETERIZATION    . CATARACT EXTRACTION W/PHACO Left 02/21/2015   Procedure: CATARACT EXTRACTION PHACO AND INTRAOCULAR LENS PLACEMENT LEFT EYE CDE=5.86;  Surgeon: Tonny Branch, MD;  Location: [facility];  Service: Ophthalmology;  Laterality: Left;  . CATARACT EXTRACTION W/PHACO Right 03/14/2015   Procedure: CATARACT EXTRACTION PHACO AND INTRAOCULAR LENS PLACEMENT ([facility]);  Surgeon: Tonny Branch, MD;  Location: [facility];  Service: Ophthalmology;  Laterality: Right;  CDE:6.36  . COLONOSCOPY  04/16/2007   RMR: Normal rectum and colon  . COLONOSCOPY WITH ESOPHAGOGASTRODUODENOSCOPY (EGD) N/A 01/08/2013   JJO:ACZY erosive reflux esophagitis. Negative H.pylori. Small hiatal hernia-s/p bx (gastritis)-TCS/Colonic polyp-hyperplastic  . DILATION AND CURETTAGE OF UTERUS    . LEFT HEART CATHETERIZATION WITH CORONARY ANGIOGRAM N/A 06/05/2012   Procedure: LEFT HEART CATHETERIZATION WITH CORONARY ANGIOGRAM;  Surgeon: Hillary Bow, MD;  Location[facility] C[facility];  Service: Cardiovascular;  Laterality: N/A;  . TOE SURGERY Right    5th toe, not sure what was wrong with it    Current Outpatient Prescriptions  Medication Sig Dispense Refill  . ALPRAZolam (XANAX) 0.5 MG tablet Take 1 tablet (0.5 mg total) by mouth 3 (three) times daily. 90 tablet 2  . gabapentin (NEURONTIN) 100 MG capsule Take 1 capsule (100 mg total) by mouth 3 (three) times daily. 270 capsule 2  .  lisinopril (PRINIVIL,ZESTRIL) 10 MG tablet Take 10 mg by mouth daily.    . megestrol (MEGACE) 40 MG/ML suspension Take by mouth daily.    . nebivolol (BYSTOLIC) 5 MG tablet Take 5 mg by mouth daily.    Marland Kitchen omeprazole (PRILOSEC) 20 MG capsule Take 1 capsule (20 mg total) by mouth daily. 90 capsule 1  . sertraline (ZOLOFT) 50 MG tablet Take 1 tablet (50 mg total) by mouth daily. 30 tablet 2  . mirtazapine (REMERON) 15 MG tablet Take 1 tablet (15 mg total) by mouth at bedtime. (Patient not taking: Reported on 02/08/2017) 30  tablet 2   No current facility-administered medications for this visit.     Allergies as of 02/08/2017  . (No Known Allergies)    Family History  Problem Relation Age of Onset  . Colon cancer Neg Hx     Social History   Social History  . Marital status: Widowed    Spouse name: N/A  . Number of children: N/A  . Years of education: N/A   Social History Main Topics  . Smoking status: Never Smoker  . Smokeless tobacco: Never Used     Comment: Never smoked  . Alcohol use No  . Drug use: No  . Sexual activity: No   Other Topics Concern  . Not on file   Social History Narrative  . No narrative on file    Review of Systems: As mentioned in HPI   Physical Exam: BP 122/63   Pulse 87   Temp 98.9 F (37.2 C) (Oral)   Ht 5\' 7"  (1.702 m)  General:   Alert and oriented. No distress noted. Pleasant and cooperative.  Head:  Normocephalic and atraumatic. Eyes:  Conjuctiva clear without scleral icterus. Mouth:  Oral mucosa pink and moist. Good dentition. No lesions. Heart:  S1, S2 present  Abdomen:  +BS, soft, mild TTP upper abdomen and periumbically and non-distended. No rebound or guarding. No HSM or masses noted. Msk:  Symmetrical without gross deformities. Normal posture. Extremities:  Without edema. Neurologic:  Alert and  oriented x4;  grossly normal neurologically. Psych:  Alert and cooperative. Normal mood and affect.

## 2017-02-08 NOTE — Patient Instructions (Signed)
Audrey Johnson  02/08/2017     @PREFPERIOPPHARMACY @   Your procedure is scheduled on  02/14/2017   Report to [facility] at  730  A.M.  Call this number if you have problems the morning of surgery:  [phone]   Remember:  Do not eat food or drink liquids after midnight.  Take these medicines the morning of surgery with A SIP OF WATER  Xanax, neurontin, lisinopril, bystolic, prilosec, zoloft.   Do not wear jewelry, make-up or nail polish.  Do not wear lotions, powders, or perfumes, or deoderant.  Do not shave 48 hours prior to surgery.  Men may shave face and neck.  Do not bring valuables to the hospital.  [facility] is not responsible for any belongings or valuables.  Contacts, dentures or bridgework may not be worn into surgery.  Leave your suitcase in the car.  After surgery it may be brought to your room.  For patients admitted to the hospital, discharge time will be determined by your treatment team.  Patients discharged the day of surgery will not be allowed to drive home.   Name and phone number of your driver:   family Special instructions: Follow the diet instructions given to you by Dr Roseanne Kaufman office.  Please read over the following fact sheets that you were given. Anesthesia Post-op Instructions and Care and Recovery After Surgery       Esophagogastroduodenoscopy Esophagogastroduodenoscopy (EGD) is a procedure to examine the lining of the esophagus, stomach, and first part of the small intestine (duodenum). This procedure is done to check for problems such as inflammation, bleeding, ulcers, or growths. During this procedure, a long, flexible, lighted tube with a camera attached (endoscope) is inserted down the throat. Tell a health care provider about:  Any allergies you have.  All medicines you are taking, including vitamins, herbs, eye drops, creams, and over-the-counter medicines.  Any problems you or family members have had with  anesthetic medicines.  Any blood disorders you have.  Any surgeries you have had.  Any medical conditions you have.  Whether you are pregnant or may be pregnant. What are the risks? Generally, this is a safe procedure. However, problems may occur, including:  Infection.  Bleeding.  A tear (perforation) in the esophagus, stomach, or duodenum.  Trouble breathing.  Excessive sweating.  Spasms of the larynx.  A slowed heartbeat.  Low blood pressure.  What happens before the procedure?  Follow instructions from your health care provider about eating or drinking restrictions.  Ask your health care provider about: ? Changing or stopping your regular medicines. This is especially important if you are taking diabetes medicines or blood thinners. ? Taking medicines such as aspirin and ibuprofen. These medicines can thin your blood. Do not take these medicines before your procedure if your health care provider instructs you not to.  Plan to have someone take you home after the procedure.  If you wear dentures, be ready to remove them before the procedure. What happens during the procedure?  To reduce your risk of infection, your health care team will wash or sanitize their hands.  An IV tube will be put in a vein in your hand or arm. You will get medicines and fluids through this tube.  You will be given one or more of the following: ? A medicine to help you relax (sedative). ? A medicine to numb the area (local anesthetic). This medicine may be  sprayed into your throat. It will make you feel more comfortable and keep you from gagging or coughing during the procedure. ? A medicine for pain.  A mouth guard may be placed in your mouth to protect your teeth and to keep you from biting on the endoscope.  You will be asked to lie on your left side.  The endoscope will be lowered down your throat into your esophagus, stomach, and duodenum.  Air will be put into the endoscope.  This will help your health care provider see better.  The lining of your esophagus, stomach, and duodenum will be examined.  Your health care provider may: ? Take a tissue sample so it can be looked at in a lab (biopsy). ? Remove growths. ? Remove objects (foreign bodies) that are stuck. ? Treat any bleeding with medicines or other devices that stop tissue from bleeding. ? Widen (dilate) or stretch narrowed areas of your esophagus and stomach.  The endoscope will be taken out. The procedure may vary among health care providers and hospitals. What happens after the procedure?  Your blood pressure, heart rate, breathing rate, and blood oxygen level will be monitored often until the medicines you were given have worn off.  Do not eat or drink anything until the numbing medicine has worn off and your gag reflex has returned. This information is not intended to replace advice given to you by your health care provider. Make sure you discuss any questions you have with your health care provider. Document Released: 10/19/2004 Document Revised: 11/24/2015 Document Reviewed: 05/12/2015 Elsevier Interactive Patient Education  2018 [employer]. Esophagogastroduodenoscopy, Care After Refer to this sheet in the next few weeks. These instructions provide you with information about caring for yourself after your procedure. Your health care provider may also give you more specific instructions. Your treatment has been planned according to current medical practices, but problems sometimes occur. Call your health care provider if you have any problems or questions after your procedure. What can I expect after the procedure? After the procedure, it is common to have:  A sore throat.  Nausea.  Bloating.  Dizziness.  Fatigue.  Follow these instructions at home:  Do not eat or drink anything until the numbing medicine (local anesthetic) has worn off and your gag reflex has returned. You will know  that the local anesthetic has worn off when you can swallow comfortably.  Do not drive for 24 hours if you received a medicine to help you relax (sedative).  If your health care provider took a tissue sample for testing during the procedure, make sure to get your test results. This is your responsibility. Ask your health care provider or the department performing the test when your results will be ready.  Keep all follow-up visits as told by your health care provider. This is important. Contact a health care provider if:  You cannot stop coughing.  You are not urinating.  You are urinating less than usual. Get help right away if:  You have trouble swallowing.  You cannot eat or drink.  You have throat or chest pain that gets worse.  You are dizzy or light-headed.  You faint.  You have nausea or vomiting.  You have chills.  You have a fever.  You have severe abdominal pain.  You have black, tarry, or bloody stools. This information is not intended to replace advice given to you by your health care provider. Make sure you discuss any questions you have with your  health care provider. Document Released: 06/04/2012 Document Revised: 11/24/2015 Document Reviewed: 05/12/2015 Elsevier Interactive Patient Education  2018 [place] Anesthesia, Adult General anesthesia is the use of medicines to make a person "go to sleep" (be unconscious) for a medical procedure. General anesthesia is often recommended when a procedure:  Is long.  Requires you to be still or in an unusual position.  Is major and can cause you to lose blood.  Is impossible to do without general anesthesia.  The medicines used for general anesthesia are called general anesthetics. In addition to making you sleep, the medicines:  Prevent pain.  Control your blood pressure.  Relax your muscles.  Tell a health care provider about:  Any allergies you have.  All medicines you are taking,  including vitamins, herbs, eye drops, creams, and over-the-counter medicines.  Any problems you or family members have had with anesthetic medicines.  Types of anesthetics you have had in the past.  Any bleeding disorders you have.  Any surgeries you have had.  Any medical conditions you have.  Any history of heart or lung conditions, such as heart failure, sleep apnea, or chronic obstructive pulmonary disease (COPD).  Whether you are pregnant or may be pregnant.  Whether you use tobacco, alcohol, marijuana, or street drugs.  Any history of Armed forces logistics/support/administrative officer.  Any history of depression or anxiety. What are the risks? Generally, this is a safe procedure. However, problems may occur, including:  Allergic reaction to anesthetics.  Lung and heart problems.  Inhaling food or liquids from your stomach into your lungs (aspiration).  Injury to nerves.  Waking up during your procedure and being unable to move (rare).  Extreme agitation or a state of mental confusion (delirium) when you wake up from the anesthetic.  Air in the bloodstream, which can lead to stroke.  These problems are more likely to develop if you are having a major surgery or if you have an advanced medical condition. You can prevent some of these complications by answering all of your health care provider's questions thoroughly and by following all pre-procedure instructions. General anesthesia can cause side effects, including:  Nausea or vomiting  A sore throat from the breathing tube.  Feeling cold or shivery.  Feeling tired, washed out, or achy.  Sleepiness or drowsiness.  Confusion or agitation.  What happens before the procedure? Staying hydrated Follow instructions from your health care provider about hydration, which may include:  Up to 2 hours before the procedure - you may continue to drink clear liquids, such as water, clear fruit juice, black coffee, and plain tea.  Eating and drinking  restrictions Follow instructions from your health care provider about eating and drinking, which may include:  8 hours before the procedure - stop eating heavy meals or foods such as meat, fried foods, or fatty foods.  6 hours before the procedure - stop eating light meals or foods, such as toast or cereal.  6 hours before the procedure - stop drinking milk or drinks that contain milk.  2 hours before the procedure - stop drinking clear liquids.  Medicines  Ask your health care provider about: ? Changing or stopping your regular medicines. This is especially important if you are taking diabetes medicines or blood thinners. ? Taking medicines such as aspirin and ibuprofen. These medicines can thin your blood. Do not take these medicines before your procedure if your health care provider instructs you not to. ? Taking new dietary supplements or medicines. Do  not take these during the week before your procedure unless your health care provider approves them.  If you are told to take a medicine or to continue taking a medicine on the day of the procedure, take the medicine with sips of water. General instructions   Ask if you will be going home the same day, the following day, or after a longer hospital stay. ? Plan to have someone take you home. ? Plan to have someone stay with you for the first 24 hours after you leave the hospital or clinic.  For 3-6 weeks before the procedure, try not to use any tobacco products, such as cigarettes, chewing tobacco, and e-cigarettes.  You may brush your teeth on the morning of the procedure, but make sure to spit out the toothpaste. What happens during the procedure?  You will be given anesthetics through a mask and through an IV tube in one of your veins.  You may receive medicine to help you relax (sedative).  As soon as you are asleep, a breathing tube may be used to help you breathe.  An anesthesia specialist will stay with you throughout the  procedure. He or she will help keep you comfortable and safe by continuing to give you medicines and adjusting the amount of medicine that you get. He or she will also watch your blood pressure, pulse, and oxygen levels to make sure that the anesthetics do not cause any problems.  If a breathing tube was used to help you breathe, it will be removed before you wake up. The procedure may vary among health care providers and hospitals. What happens after the procedure?  You will wake up, often slowly, after the procedure is complete, usually in a recovery area.  Your blood pressure, heart rate, breathing rate, and blood oxygen level will be monitored until the medicines you were given have worn off.  You may be given medicine to help you calm down if you feel anxious or agitated.  If you will be going home the same day, your health care provider may check to make sure you can stand, drink, and urinate.  Your health care providers will treat your pain and side effects before you go home.  Do not drive for 24 hours if you received a sedative.  You may: ? Feel nauseous and vomit. ? Have a sore throat. ? Have mental slowness. ? Feel cold or shivery. ? Feel sleepy. ? Feel tired. ? Feel sore or achy, even in parts of your body where you did not have surgery. This information is not intended to replace advice given to you by your health care provider. Make sure you discuss any questions you have with your health care provider. Document Released: 09/25/2007 Document Revised: 11/29/2015 Document Reviewed: 06/02/2015 Elsevier Interactive Patient Education  2018 [place] Anesthesia, Adult, Care After These instructions provide you with information about caring for yourself after your procedure. Your health care provider may also give you more specific instructions. Your treatment has been planned according to current medical practices, but problems sometimes occur. Call your health care  provider if you have any problems or questions after your procedure. What can I expect after the procedure? After the procedure, it is common to have:  Vomiting.  A sore throat.  Mental slowness.  It is common to feel:  Nauseous.  Cold or shivery.  Sleepy.  Tired.  Sore or achy, even in parts of your body where you did not have surgery.  Follow these instructions at home: For at least 24 hours after the procedure:  Do not: ? Participate in activities where you could fall or become injured. ? Drive. ? Use heavy machinery. ? Drink alcohol. ? Take sleeping pills or medicines that cause drowsiness. ? Make important decisions or sign legal documents. ? Take care of children on your own.  Rest. Eating and drinking  If you vomit, drink water, juice, or soup when you can drink without vomiting.  Drink enough fluid to keep your urine clear or pale yellow.  Make sure you have little or no nausea before eating solid foods.  Follow the diet recommended by your health care provider. General instructions  Have a responsible adult stay with you until you are awake and alert.  Return to your normal activities as told by your health care provider. Ask your health care provider what activities are safe for you.  Take over-the-counter and prescription medicines only as told by your health care provider.  If you smoke, do not smoke without supervision.  Keep all follow-up visits as told by your health care provider. This is important. Contact a health care provider if:  You continue to have nausea or vomiting at home, and medicines are not helpful.  You cannot drink fluids or start eating again.  You cannot urinate after 8-12 hours.  You develop a skin rash.  You have fever.  You have increasing redness at the site of your procedure. Get help right away if:  You have difficulty breathing.  You have chest pain.  You have unexpected bleeding.  You feel that you  are having a life-threatening or urgent problem. This information is not intended to replace advice given to you by your health care provider. Make sure you discuss any questions you have with your health care provider. Document Released: 09/24/2000 Document Revised: 11/21/2015 Document Reviewed: 06/02/2015 Elsevier Interactive Patient Education  [employer].

## 2017-02-08 NOTE — Patient Instructions (Signed)
Please have blood work done today.  We have scheduled you for a special CT scan to look at the vessels in your abdomen.  Continue Prilosec once each morning, 30 minutes before breakfast.  Start taking the samples of Linzess 1 capsule each morning 30 minutes before breakfast. You may have some loose stool at first, but this should get better. If not, call me next week.

## 2017-02-08 NOTE — Patient Instructions (Signed)
PA info for CT angio abdomen submitted via [facility] website. Case approved. PA# [id number], 02/11/17-03/13/17.

## 2017-02-11 ENCOUNTER — Ambulatory Visit (HOSPITAL_COMMUNITY)
Admission: RE | Admit: 2017-02-11 | Discharge: 2017-02-11 | Disposition: A | Discharge status: Home / Self Care | Payer: Medicare HMO | Source: Ambulatory Visit | Attending: Gastroenterology | Admitting: Gastroenterology

## 2017-02-11 ENCOUNTER — Encounter (HOSPITAL_COMMUNITY)
Admission: RE | Admit: 2017-02-11 | Discharge: 2017-02-11 | Disposition: A | Discharge status: Home / Self Care | Payer: Medicare HMO | Source: Ambulatory Visit | Attending: Internal Medicine | Admitting: Internal Medicine

## 2017-02-11 ENCOUNTER — Ambulatory Visit (HOSPITAL_COMMUNITY): Admission: RE | Admit: 2017-02-11 | Payer: Medicare HMO | Source: Ambulatory Visit

## 2017-02-11 ENCOUNTER — Encounter (HOSPITAL_COMMUNITY): Payer: Self-pay

## 2017-02-11 ENCOUNTER — Other Ambulatory Visit: Payer: Self-pay

## 2017-02-11 ENCOUNTER — Telehealth: Payer: Self-pay

## 2017-02-11 DIAGNOSIS — M5137 Other intervertebral disc degeneration, lumbosacral region: Secondary | ICD-10-CM | POA: Diagnosis not present

## 2017-02-11 DIAGNOSIS — M48061 Spinal stenosis, lumbar region without neurogenic claudication: Secondary | ICD-10-CM | POA: Diagnosis not present

## 2017-02-11 DIAGNOSIS — R1030 Lower abdominal pain, unspecified: Secondary | ICD-10-CM | POA: Diagnosis not present

## 2017-02-11 DIAGNOSIS — G8929 Other chronic pain: Secondary | ICD-10-CM

## 2017-02-11 DIAGNOSIS — M5136 Other intervertebral disc degeneration, lumbar region: Secondary | ICD-10-CM | POA: Diagnosis not present

## 2017-02-11 DIAGNOSIS — R109 Unspecified abdominal pain: Secondary | ICD-10-CM | POA: Insufficient documentation

## 2017-02-11 DIAGNOSIS — I700 Atherosclerosis of aorta: Secondary | ICD-10-CM | POA: Insufficient documentation

## 2017-02-11 HISTORY — DX: Major depressive disorder, single episode, unspecified: F32.9

## 2017-02-11 HISTORY — DX: Depression, unspecified: F32.A

## 2017-02-11 MED ORDER — IOPAMIDOL (ISOVUE-370) INJECTION 76%
100.0000 mL | Freq: Once | INTRAVENOUS | Status: AC | PRN
  Administered 2017-02-11: 100 mL via INTRAVENOUS

## 2017-02-11 NOTE — Telephone Encounter (Signed)
Audrey Johnson at [employer] called office this morning. She received a message from CT at [facility] that CT angio that's scheduled for today may need to be abdomen and pelvis. AB gave the ok to change order to CT angio abd/pelvis. Called Audrey Johnson back and informed her of order change. The prior authorization that was done 02/08/17 was for CT angio abd/pelvis. PA# [id number], 02/11/17-03/13/17.

## 2017-02-13 ENCOUNTER — Encounter: Payer: Self-pay | Admitting: Gastroenterology

## 2017-02-13 NOTE — Assessment & Plan Note (Signed)
Trial of Linzess 290 mcg once daily. Samples provided.

## 2017-02-13 NOTE — Progress Notes (Signed)
APPOINTMENT MADE °

## 2017-02-13 NOTE — Progress Notes (Signed)
cc'ed to pcp °

## 2017-02-13 NOTE — Assessment & Plan Note (Addendum)
76 year old female with known chronic abdominal pain that has been evaluated extensively in past, presenting with worsening of epigastric and periumbilical pain. Lower abdominal pain likely secondary to constipation. She has had documented weight loss. Labs thus far unrevealing. She has already been scheduled for an EGD, which we will keep scheduled as her last was in 2014. In interim, I would like to obtain a CTA, as she has never had one before, especially in light of her weight loss and abdominal pain after eating.  CTA now Keep EGD scheduled with Dr. Gala Romney. Risks, benefits discussed in detail with stated understanding. PROPOFOL due to polypharmacy Continue Prilosec once each morning See constipation   UPDATE: Transaminases with new onset elevation, ALT 89, AST 54. Lipase has remained normal. CTA and CT on file. No ultrasound, which would be more sensitive for gallstones if that is a concern at this time after EGD is completed. CTA negative for chronic mesenteric ischemia. We will see what EGD shows and go from there. LFTs will need to be followed. Office to arrange visit for follow-up in September.

## 2017-02-14 ENCOUNTER — Encounter (HOSPITAL_COMMUNITY)
Admission: RE | Disposition: A | Discharge status: Home / Self Care | Payer: Self-pay | Source: Ambulatory Visit | Attending: Internal Medicine

## 2017-02-14 ENCOUNTER — Ambulatory Visit (HOSPITAL_COMMUNITY): Payer: Medicare HMO | Admitting: Anesthesiology

## 2017-02-14 ENCOUNTER — Ambulatory Visit (HOSPITAL_COMMUNITY)
Admission: RE | Admit: 2017-02-14 | Discharge: 2017-02-14 | Disposition: A | Discharge status: Home / Self Care | Payer: Medicare HMO | Source: Ambulatory Visit | Attending: Internal Medicine | Admitting: Internal Medicine

## 2017-02-14 ENCOUNTER — Encounter (HOSPITAL_COMMUNITY): Payer: Self-pay

## 2017-02-14 ENCOUNTER — Telehealth: Payer: Self-pay | Admitting: Internal Medicine

## 2017-02-14 ENCOUNTER — Other Ambulatory Visit: Payer: Self-pay

## 2017-02-14 DIAGNOSIS — M479 Spondylosis, unspecified: Secondary | ICD-10-CM | POA: Insufficient documentation

## 2017-02-14 DIAGNOSIS — K219 Gastro-esophageal reflux disease without esophagitis: Secondary | ICD-10-CM | POA: Insufficient documentation

## 2017-02-14 DIAGNOSIS — I10 Essential (primary) hypertension: Secondary | ICD-10-CM | POA: Diagnosis not present

## 2017-02-14 DIAGNOSIS — R1033 Periumbilical pain: Secondary | ICD-10-CM | POA: Diagnosis not present

## 2017-02-14 DIAGNOSIS — Z86010 Personal history of colonic polyps: Secondary | ICD-10-CM | POA: Insufficient documentation

## 2017-02-14 DIAGNOSIS — R634 Abnormal weight loss: Secondary | ICD-10-CM | POA: Diagnosis not present

## 2017-02-14 DIAGNOSIS — G8929 Other chronic pain: Secondary | ICD-10-CM | POA: Insufficient documentation

## 2017-02-14 DIAGNOSIS — F419 Anxiety disorder, unspecified: Secondary | ICD-10-CM | POA: Diagnosis not present

## 2017-02-14 DIAGNOSIS — E7800 Pure hypercholesterolemia, unspecified: Secondary | ICD-10-CM | POA: Insufficient documentation

## 2017-02-14 DIAGNOSIS — K449 Diaphragmatic hernia without obstruction or gangrene: Secondary | ICD-10-CM | POA: Diagnosis not present

## 2017-02-14 DIAGNOSIS — R1013 Epigastric pain: Secondary | ICD-10-CM

## 2017-02-14 DIAGNOSIS — F329 Major depressive disorder, single episode, unspecified: Secondary | ICD-10-CM | POA: Insufficient documentation

## 2017-02-14 DIAGNOSIS — R109 Unspecified abdominal pain: Secondary | ICD-10-CM

## 2017-02-14 HISTORY — PX: ESOPHAGOGASTRODUODENOSCOPY (EGD) WITH PROPOFOL: SHX5813

## 2017-02-14 LAB — SURGICAL PCR SCREEN
MRSA, PCR: NEGATIVE
Staphylococcus aureus: POSITIVE — AB

## 2017-02-14 LAB — HEPATIC FUNCTION PANEL
ALT: 67 U/L — ABNORMAL HIGH (ref 14–54)
AST: 38 U/L (ref 15–41)
Albumin: 4.1 g/dL (ref 3.5–5.0)
Alkaline Phosphatase: 95 U/L (ref 38–126)
Bilirubin, Direct: 0.1 mg/dL (ref 0.1–0.5)
Indirect Bilirubin: 0.7 mg/dL (ref 0.3–0.9)
Total Bilirubin: 0.8 mg/dL (ref 0.3–1.2)
Total Protein: 7.7 g/dL (ref 6.5–8.1)

## 2017-02-14 SURGERY — ESOPHAGOGASTRODUODENOSCOPY (EGD) WITH PROPOFOL
Anesthesia: Monitor Anesthesia Care

## 2017-02-14 MED ORDER — LACTATED RINGERS IV SOLN
INTRAVENOUS | Status: DC
  Administered 2017-02-14: 08:00:00 via INTRAVENOUS

## 2017-02-14 MED ORDER — FENTANYL CITRATE (PF) 100 MCG/2ML IJ SOLN
25.0000 ug | Freq: Once | INTRAMUSCULAR | Status: AC
  Administered 2017-02-14: 25 ug via INTRAVENOUS

## 2017-02-14 MED ORDER — LIDOCAINE VISCOUS 2 % MT SOLN
15.0000 mL | Freq: Once | OROMUCOSAL | Status: AC
  Administered 2017-02-14: 15 mL via OROMUCOSAL

## 2017-02-14 MED ORDER — LIDOCAINE VISCOUS 2 % MT SOLN
OROMUCOSAL | Status: AC
  Filled 2017-02-14: qty 15

## 2017-02-14 MED ORDER — CHLORHEXIDINE GLUCONATE CLOTH 2 % EX PADS: 6.0000 | MEDICATED_PAD | Freq: Once | CUTANEOUS | Status: DC

## 2017-02-14 MED ORDER — FENTANYL CITRATE (PF) 100 MCG/2ML IJ SOLN
INTRAMUSCULAR | Status: AC
  Filled 2017-02-14: qty 2

## 2017-02-14 MED ORDER — PROPOFOL 10 MG/ML IV BOLUS
INTRAVENOUS | Status: AC
  Filled 2017-02-14: qty 40

## 2017-02-14 MED ORDER — PROPOFOL 500 MG/50ML IV EMUL
INTRAVENOUS | Status: DC | PRN
  Administered 2017-02-14: 125 ug/kg/min via INTRAVENOUS

## 2017-02-14 MED ORDER — MIDAZOLAM HCL 5 MG/5ML IJ SOLN
INTRAMUSCULAR | Status: DC | PRN
  Administered 2017-02-14: 2 mg via INTRAVENOUS

## 2017-02-14 MED ORDER — MIDAZOLAM HCL 2 MG/2ML IJ SOLN
1.0000 mg | INTRAMUSCULAR | Status: AC
  Administered 2017-02-14: 2 mg via INTRAVENOUS
  Filled 2017-02-14: qty 2

## 2017-02-14 NOTE — Op Note (Signed)
[facility] Patient Name: Audrey Johnson Procedure Date: 02/14/2017 8:46 AM MRN: [id number] Date of Birth: [birth year] Attending MD: Norvel Richards , MD CSN: [id number] Age: 76 Admit Type: Outpatient Procedure:                Upper GI endoscopy Indications:              Epigastric abdominal pain Providers:                Norvel Richards, MD, Lurline Del, RN, Randa Spike, Technician Referring MD:              Medicines:                Propofol per Anesthesia Complications:            No immediate complications. Estimated Blood Loss:     Estimated blood loss: none. Procedure:                Pre-Anesthesia Assessment:                           - Prior to the procedure, a History and Physical                            was performed, and patient medications and                            allergies were reviewed. The patient's tolerance of                            previous anesthesia was also reviewed. The risks                            and benefits of the procedure and the sedation                            options and risks were discussed with the patient.                            All questions were answered, and informed consent                            was obtained. Prior Anticoagulants: The patient has                            taken no previous anticoagulant or antiplatelet                            agents. ASA Grade Assessment: II - A patient with                            mild systemic disease. After reviewing the risks  and benefits, the patient was deemed in                            satisfactory condition to undergo the procedure.                           After obtaining informed consent, the endoscope was                            passed under direct vision. Throughout the                            procedure, the patient's blood pressure, pulse, and                            oxygen  saturations were monitored continuously. The                            [id number] ([id number]) scope was introduced through the                            and advanced to the second part of duodenum. The                            upper GI endoscopy was accomplished without                            difficulty. The patient tolerated the procedure                            well. Scope In: 9:17:10 AM Scope Out: 9:22:48 AM Total Procedure Duration: 0 hours 5 minutes 38 seconds  Findings:      The examined esophagus was normal.      A small hiatal hernia was present.      The exam was otherwise without abnormality.      The duodenal bulb and second portion of the duodenum were normal. Impression:               - Normal esophagus.                           - Small hiatal hernia.                           - The examination was otherwise normal.                           - No specimens collected. No explanation for                            patient's sympion. CTA failed to demonstrate any                            evidence compelling for mesenteric ischemia.  Gallbladder needs further evaluation. Moderate Sedation:      Moderate (conscious) sedation was personally administered by an       anesthesia professional. The following parameters were monitored: oxygen       saturation, heart rate, blood pressure, respiratory rate, EKG, adequacy       of pulmonary ventilation, and response to care. Total physician       intraservice time was 12 minutes. Recommendation:           - Patient has a contact number available for                            emergencies. The signs and symptoms of potential                            delayed complications were discussed with the                            patient. Return to normal activities tomorrow.                            Written discharge instructions were provided to the                            patient.                            - Resume previous diet.                           - Continue present medications. Proceed with a                            gallbladder ultrasound and obtain a hepatic                            function profile today.                           - Await pathology results.                           - No repeat upper endoscopy.                           - Return to GI office in 2 months. Procedure Code(s):        --- Professional ---                           [phone], Esophagogastroduodenoscopy, flexible,                            transoral; diagnostic, including collection of                            specimen(s) by brushing or washing, when performed                            (  separate procedure) Diagnosis Code(s):        --- Professional ---                           K44.9, Diaphragmatic hernia without obstruction or                            gangrene                           R10.13, Epigastric pain CPT copyright 2016 American [employer] Association. All rights reserved. The codes documented in this report are preliminary and upon coder review may  be revised to meet current compliance requirements. Cristopher Estimable. Karo Rog, MD Norvel Richards, MD 02/14/2017 9:42:08 AM This report has been signed electronically. Number of Addenda: 0

## 2017-02-14 NOTE — Interval H&P Note (Signed)
History and Physical Interval Note:  02/14/2017 9:07 AM  Audrey Johnson  has presented today for surgery, with the diagnosis of abdominal pain, GERD  The various methods of treatment have been discussed with the patient and family. After consideration of risks, benefits and other options for treatment, the patient has consented to  Procedure(s) with comments: ESOPHAGOGASTRODUODENOSCOPY (EGD) WITH PROPOFOL (N/A) - 9:00am as a surgical intervention .  The patient's history has been reviewed, patient examined, no change in status, stable for surgery.  I have reviewed the patient's chart and labs.  Questions were answered to the patient's satisfaction.    CTA without any evidence of critical mesenteric arterial stenosis or other cause for abdominal pain.  No dysphagia. EGD today per plan.  The risks, benefits, limitations, alternatives and imponderables have been reviewed with the patient. Potential for esophageal dilation, biopsy, etc. have also been reviewed.  Questions have been answered. All parties agreeable.    Manus Rudd

## 2017-02-14 NOTE — Anesthesia Postprocedure Evaluation (Signed)
Anesthesia Post Note  Patient: Audrey Johnson  Procedure(s) Performed: Procedure(s) (LRB): ESOPHAGOGASTRODUODENOSCOPY (EGD) WITH PROPOFOL (N/A)  Patient location during evaluation: PACU Anesthesia Type: MAC Level of consciousness: awake and patient cooperative Pain management: pain level controlled Vital Signs Assessment: post-procedure vital signs reviewed and stable Respiratory status: spontaneous breathing, nonlabored ventilation and respiratory function stable Cardiovascular status: blood pressure returned to baseline Postop Assessment: no signs of nausea or vomiting Anesthetic complications: no     Last Vitals:  Vitals:   02/14/17 0900 02/14/17 0905  BP: (!) 120/55   Pulse:    Resp: (!) 31 19  Temp:    SpO2: 100% 96%    Last Pain:  Vitals:   02/14/17 0750  TempSrc: Oral                 Martino Tompson J

## 2017-02-14 NOTE — Discharge Instructions (Addendum)
EGD Discharge instructions Please read the instructions outlined below and refer to this sheet in the next few weeks. These discharge instructions provide you with general information on caring for yourself after you leave the hospital. Your doctor may also give you specific instructions. While your treatment has been planned according to the most current medical practices available, unavoidable complications occasionally occur. If you have any problems or questions after discharge, please call your doctor. ACTIVITY  You may resume your regular activity but move at a slower pace for the next 24 hours.   Take frequent rest periods for the next 24 hours.   Walking will help expel (get rid of) the air and reduce the bloated feeling in your abdomen.   No driving for 24 hours (because of the anesthesia (medicine) used during the test).   You may shower.   Do not sign any important legal documents or operate any machinery for 24 hours (because of the anesthesia used during the test).  NUTRITION  Drink plenty of fluids.   You may resume your normal diet.   Begin with a light meal and progress to your normal diet.   Avoid alcoholic beverages for 24 hours or as instructed by your caregiver.  MEDICATIONS  You may resume your normal medications unless your caregiver tells you otherwise.  WHAT YOU CAN EXPECT TODAY  You may experience abdominal discomfort such as a feeling of fullness or gas pains.  FOLLOW-UP  Your doctor will discuss the results of your test with you.  SEEK IMMEDIATE MEDICAL ATTENTION IF ANY OF THE FOLLOWING OCCUR:  Excessive nausea (feeling sick to your stomach) and/or vomiting.   Severe abdominal pain and distention (swelling).   Trouble swallowing.   Temperature over 101 F (37.8 C).   Rectal bleeding or vomiting of blood.    Hepatic profile today  Gallbladder ultrasound on another day to evaluate epigastric pain  Office visit with [place] in 4-6 weeks  Sept  10th at 11:30 am

## 2017-02-14 NOTE — Telephone Encounter (Signed)
Ultrasound abdomen limited RUQ scheduled for 02/22/17 at 9:30am, arrive at 9:15am. NPO 6-8 hours before test. Called and informed pt of appt. Letter also mailed.

## 2017-02-14 NOTE — Transfer of Care (Signed)
Immediate Anesthesia Transfer of Care Note  Patient: Audrey Johnson  Procedure(s) Performed: Procedure(s) with comments: ESOPHAGOGASTRODUODENOSCOPY (EGD) WITH PROPOFOL (N/A) - 9:00am  Patient Location: PACU  Anesthesia Type:MAC  Level of Consciousness: awake and patient cooperative  Airway & Oxygen Therapy: Patient Spontanous Breathing and Patient connected to nasal cannula oxygen  Post-op Assessment: Report given to RN, Post -op Vital signs reviewed and stable and Patient moving all extremities  Post vital signs: Reviewed and stable  Last Vitals:  Vitals:   02/14/17 0900 02/14/17 0905  BP: (!) 120/55   Pulse:    Resp: (!) 31 19  Temp:    SpO2: 100% 96%    Last Pain:  Vitals:   02/14/17 0750  TempSrc: Oral      Patients Stated Pain Goal: 9 (50/93/26 7124)  Complications: No apparent anesthesia complications

## 2017-02-14 NOTE — Telephone Encounter (Signed)
Day surgery called and stated patient is to have an ultrasound per rmr

## 2017-02-14 NOTE — Anesthesia Preprocedure Evaluation (Signed)
Anesthesia Evaluation  Patient identified by MRN, date of birth, ID band Patient awake    Reviewed: Allergy & Precautions, NPO status , Patient's Chart, lab work & pertinent test results, reviewed documented beta blocker date and time   Airway Mallampati: II  TM Distance: >3 FB     Dental  (+) Teeth Intact   Pulmonary shortness of breath and with exertion, pneumonia, resolved,    breath sounds clear to auscultation       Cardiovascular hypertension, Pt. on medications and Pt. on home beta blockers  Rhythm:Regular Rate:Normal     Neuro/Psych PSYCHIATRIC DISORDERS Anxiety Depression    GI/Hepatic GERD  Medicated and Controlled,  Endo/Other    Renal/GU      Musculoskeletal  (+) Arthritis ,   Abdominal   Peds  Hematology   Anesthesia Other Findings   Reproductive/Obstetrics                             Anesthesia Physical Anesthesia Plan  ASA: III  Anesthesia Plan: MAC   Post-op Pain Management:    Induction: Intravenous  PONV Risk Score and Plan:   Airway Management Planned: Simple Face Mask  Additional Equipment:   Intra-op Plan:   Post-operative Plan:   Informed Consent: I have reviewed the patients History and Physical, chart, labs and discussed the procedure including the risks, benefits and alternatives for the proposed anesthesia with the patient or authorized representative who has indicated his/her understanding and acceptance.     Plan Discussed with:   Anesthesia Plan Comments:         Anesthesia Quick Evaluation

## 2017-02-14 NOTE — H&P (View-Only) (Signed)
Referring Provider: Celene Squibb, MD Primary Care Physician:  Celene Squibb, MD Primary GI: Dr. Gala Romney   Chief Complaint  Patient presents with  . Abdominal Pain    HPI:   Audrey Johnson is a 76 y.o. female presenting today with a history of chronic abdominal pain, GERD, constipation. Last seen July 2018 and arranged to have EGD with Propofol due to epigastric pain that was acute on chronic. Due to severity of pain in July, stat labs and CT were ordered. She had reported possible black stools intermittently, BUT HEME NEGATIVE. Lipase, CBC unrevealing. CMP showed ALT elevated at 89, otherwise normal LFTs. CT with contrast July 2018 without acute abnormality, small sliding hiatal hernia noted.   Extensive prior evaluation in past due to chronic abdominal pain with a CT inJune 2014 reviewed with Dr. Thornton Papas with celiac, SMA, and IMA patent. Last EGD in 2014 with esophagitis, negative H.pylori. Colonoscopy 2014 with hyperplastic polyp. Recommended pain management in the past. She has not had a CTA. She has had multiple CTs in the past. I do note that she has weight loss over the last year of 16 lbs.   Notes pain in lower abdomen after having BM. Drinking/eating causes pain periumbilically. Today tells me no black stools, just dark. Taking Prilosec each morning. Feels constipated. Not on anything for it. Can't eat if doesn't have Megace.   Past Medical History:  Diagnosis Date  . Anxiety   . Arthritis    in back .   Marland Kitchen Borderline diabetes   . Chronic abdominal pain   . Chronic back pain   . Chronic constipation   . Chronic nausea   . GERD (gastroesophageal reflux disease)   . High cholesterol   . Hypertension   . Noncompliance with medication regimen   . Pneumonia   . Poor historian   . Shortness of breath     even walking causes sob...     Past Surgical History:  Procedure Laterality Date  . ABDOMINAL HYSTERECTOMY    . BREAST SURGERY  unknown (many yrs)   excision  of lump  right breast  was per pt "nothing"   . CARDIAC CATHETERIZATION    . CATARACT EXTRACTION W/PHACO Left 02/21/2015   Procedure: CATARACT EXTRACTION PHACO AND INTRAOCULAR LENS PLACEMENT LEFT EYE CDE=5.86;  Surgeon: Tonny Branch, MD;  Location: [facility];  Service: Ophthalmology;  Laterality: Left;  . CATARACT EXTRACTION W/PHACO Right 03/14/2015   Procedure: CATARACT EXTRACTION PHACO AND INTRAOCULAR LENS PLACEMENT ([place]);  Surgeon: Tonny Branch, MD;  Location: [facility];  Service: Ophthalmology;  Laterality: Right;  CDE:6.36  . COLONOSCOPY  04/16/2007   RMR: Normal rectum and colon  . COLONOSCOPY WITH ESOPHAGOGASTRODUODENOSCOPY (EGD) N/A 01/08/2013   RWE:RXVQ erosive reflux esophagitis. Negative H.pylori. Small hiatal hernia-s/p bx (gastritis)-TCS/Colonic polyp-hyperplastic  . DILATION AND CURETTAGE OF UTERUS    . LEFT HEART CATHETERIZATION WITH CORONARY ANGIOGRAM N/A 06/05/2012   Procedure: LEFT HEART CATHETERIZATION WITH CORONARY ANGIOGRAM;  Surgeon: Hillary Bow, MD;  [facility];  Service: Cardiovascular;  Laterality: N/A;  . TOE SURGERY Right    5th toe, not sure what was wrong with it    Current Outpatient Prescriptions  Medication Sig Dispense Refill  . ALPRAZolam (XANAX) 0.5 MG tablet Take 1 tablet (0.5 mg total) by mouth 3 (three) times daily. 90 tablet 2  . gabapentin (NEURONTIN) 100 MG capsule Take 1 capsule (100 mg total) by mouth 3 (three) times daily. 270 capsule 2  .  lisinopril (PRINIVIL,ZESTRIL) 10 MG tablet Take 10 mg by mouth daily.    . megestrol (MEGACE) 40 MG/ML suspension Take by mouth daily.    . nebivolol (BYSTOLIC) 5 MG tablet Take 5 mg by mouth daily.    Marland Kitchen omeprazole (PRILOSEC) 20 MG capsule Take 1 capsule (20 mg total) by mouth daily. 90 capsule 1  . sertraline (ZOLOFT) 50 MG tablet Take 1 tablet (50 mg total) by mouth daily. 30 tablet 2  . mirtazapine (REMERON) 15 MG tablet Take 1 tablet (15 mg total) by mouth at bedtime. (Patient not taking: Reported on 02/08/2017) 30  tablet 2   No current facility-administered medications for this visit.     Allergies as of 02/08/2017  . (No Known Allergies)    Family History  Problem Relation Age of Onset  . Colon cancer Neg Hx     Social History   Social History  . Marital status: Widowed    Spouse name: N/A  . Number of children: N/A  . Years of education: N/A   Social History Main Topics  . Smoking status: Never Smoker  . Smokeless tobacco: Never Used     Comment: Never smoked  . Alcohol use No  . Drug use: No  . Sexual activity: No   Other Topics Concern  . Not on file   Social History Narrative  . No narrative on file    Review of Systems: As mentioned in HPI   Physical Exam: BP 122/63   Pulse 87   Temp 98.9 F (37.2 C) (Oral)   Ht 5\' 7"  (1.702 m)  General:   Alert and oriented. No distress noted. Pleasant and cooperative.  Head:  Normocephalic and atraumatic. Eyes:  Conjuctiva clear without scleral icterus. Mouth:  Oral mucosa pink and moist. Good dentition. No lesions. Heart:  S1, S2 present  Abdomen:  +BS, soft, mild TTP upper abdomen and periumbically and non-distended. No rebound or guarding. No HSM or masses noted. Msk:  Symmetrical without gross deformities. Normal posture. Extremities:  Without edema. Neurologic:  Alert and  oriented x4;  grossly normal neurologically. Psych:  Alert and cooperative. Normal mood and affect.

## 2017-02-15 ENCOUNTER — Other Ambulatory Visit: Payer: Self-pay | Admitting: Internal Medicine

## 2017-02-15 DIAGNOSIS — R7989 Other specified abnormal findings of blood chemistry: Secondary | ICD-10-CM

## 2017-02-15 DIAGNOSIS — R945 Abnormal results of liver function studies: Secondary | ICD-10-CM

## 2017-02-18 ENCOUNTER — Encounter (HOSPITAL_COMMUNITY): Payer: Self-pay | Admitting: Internal Medicine

## 2017-02-22 ENCOUNTER — Ambulatory Visit (HOSPITAL_COMMUNITY)
Admission: RE | Admit: 2017-02-22 | Discharge: 2017-02-22 | Disposition: A | Discharge status: Home / Self Care | Payer: Medicare HMO | Source: Ambulatory Visit | Attending: Internal Medicine | Admitting: Internal Medicine

## 2017-02-22 DIAGNOSIS — R109 Unspecified abdominal pain: Secondary | ICD-10-CM | POA: Diagnosis not present

## 2017-02-22 DIAGNOSIS — R1013 Epigastric pain: Secondary | ICD-10-CM | POA: Insufficient documentation

## 2017-02-25 ENCOUNTER — Ambulatory Visit: Payer: Self-pay | Admitting: Nurse Practitioner

## 2017-02-25 ENCOUNTER — Ambulatory Visit: Payer: Self-pay | Admitting: Orthopedic Surgery

## 2017-02-26 ENCOUNTER — Ambulatory Visit (INDEPENDENT_AMBULATORY_CARE_PROVIDER_SITE_OTHER): Payer: Medicare HMO | Admitting: Psychiatry

## 2017-02-26 ENCOUNTER — Encounter (HOSPITAL_COMMUNITY): Payer: Self-pay | Admitting: Psychiatry

## 2017-02-26 VITALS — BP 116/63 | HR 70 | Ht 67.0 in | Wt 152.6 lb

## 2017-02-26 DIAGNOSIS — F459 Somatoform disorder, unspecified: Secondary | ICD-10-CM

## 2017-02-26 DIAGNOSIS — F321 Major depressive disorder, single episode, moderate: Secondary | ICD-10-CM

## 2017-02-26 MED ORDER — ALPRAZOLAM 0.5 MG PO TABS: 0.5000 mg | ORAL_TABLET | Freq: Three times a day (TID) | ORAL | 2 refills | Status: DC

## 2017-02-26 MED ORDER — SERTRALINE HCL 50 MG PO TABS: 50.0000 mg | ORAL_TABLET | Freq: Every day | ORAL | 2 refills | Status: DC

## 2017-02-26 NOTE — Progress Notes (Signed)
Patient ID: Audrey Johnson, female   DOB: 12/19/1940, 76 y.o.   MRN: [id number] Patient ID: Audrey Johnson, female   DOB: Feb 26, 1941, 76 y.o.   MRN: [id number] Patient ID: Audrey Johnson, female   DOB: [birth year], 76 y.o.   MRN: [id number] Patient ID: Audrey Johnson, female   DOB: 06-05-1941, 76 y.o.   MRN: [id number] Patient ID: Audrey Johnson, female   DOB: 01-Nov-1940, 76 y.o.   MRN: [id number] Patient ID: Audrey Johnson, female   DOB: 03-23-1941, 76 y.o.   MRN: [id number] Patient ID: Audrey Johnson, female   DOB: [birth year], 76 y.o.   MRN: [id number] Patient ID: Audrey Johnson, female   DOB: [birth year], 76 y.o.   MRN: [id number] Patient ID: Audrey Johnson, female   DOB: 05/06/1941, 76 y.o.   MRN: [id number] Patient ID: Audrey Johnson, female   DOB: 06/17/1941, 76 y.o.   MRN: [id number] Patient ID: Audrey Johnson, female   DOB: 1940/12/17, 76 y.o.   MRN: [id number] Patient ID: Audrey Johnson, female   DOB: 1940-08-12, 76 y.o.   MRN: [id number] Patient ID: Audrey Johnson, female   DOB: Nov 22, 1940, 76 y.o.   MRN: [id number] Patient ID: Audrey Johnson, female   DOB: 06-28-1941, 76 y.o.   MRN: [id number]  Psychiatric Assessment Adult  Patient Identification:  Audrey Johnson Date of Evaluation:  02/26/2017 Chief Complaint: I am still not eating History of Chief Complaint:   Chief Complaint  Patient presents with  . Depression  . Anxiety  . Follow-up    Depression         Associated symptoms include appetite change.  Past medical history includes anxiety.   Anxiety     Altered Mental Status  Associated symptoms include joint swelling.   this patient is a 76 year old widowed black female who lives alonein [place].The patient used to work in a [employer] but has been retired for several years.  The patient was referred by Dr. Nevada Crane, her primary Dr. Apparently she's not had any history of mental health symptoms until about 2 months ago.  She started to have severe stomach pain and has gone to several emergency room this in the local area. She's had 9 emergency room visits in the last couple of months. Her last one was yesterday. She's had 3 abdominal CTs in the last month, and ultrasound and blood work. Nothing significantly wrong has been found.  The patient is on numerous medications for her stomach but they don't seem to help. She was prescribed Lexapro by her primary doctor but she refused to take it.  she is taking a low dose of Xanax  She returns after 6 weeks. Since I last saw her she had an upper endoscopy which showed a small hiatal hernia. She had an ultrasound of the right upper quadrant that was totally negative. She still continues to have constant stomach pain after she eats or drinks anything. GI is also done angiography of her abdomen and pelvis which was negative. She had slightly elevated LFTs but no other lab abnormalities. She states she is going to have more testing done but it's not clear from the chart with that will be. She states that she stays drowsy and sleepy and feels weak. She never started the mirtazapine she thinks the gabapentin is making her feel weak and tired so we will might as well just stop it. She's very negative and thinks that the doctors have not been able to  find what is really wrong with her Review of Systems  Constitutional: Positive for appetite change and unexpected weight change.  Musculoskeletal: Positive for back pain and joint swelling.  Psychiatric/Behavioral: Positive for depression.   Physical Exam not done  Depressive Symptoms: depressed mood, anhedonia, insomnia, psychomotor agitation, difficulty concentrating, hopelessness, suicidal thoughts with specific plan, anxiety, loss of energy/fatigue, weight loss,  (Hypo) Manic Symptoms:   Elevated Mood:  No Irritable Mood:  Yes Grandiosity:  No Distractibility:  No Labiality of Mood:  Yes Delusions:  Yes Hallucinations:   Yes Impulsivity:  No Sexually Inappropriate Behavior:  No Financial Extravagance:  No Flight of Ideas:  No  Anxiety Symptoms: Excessive Worry:  Yes Panic Symptoms:  No Agoraphobia:  No Obsessive Compulsive: No  Symptoms: None, Specific Phobias:  No Social Anxiety:  No  Psychotic Symptoms:  Hallucinations: Yes Visual Delusions:  Yes Paranoia:  No   Ideas of Reference:  No  PTSD Symptoms: Ever had a traumatic exposure:  No Had a traumatic exposure in the last month:  No Re-experiencing: No None Hypervigilance:  No Hyperarousal: No None Avoidance: No None  Traumatic Brain Injury: No   Past Psychiatric History: Diagnosis: None   Hospitalizations: None   Outpatient Care: None   Substance Abuse Care: None   Self-Mutilation: None   Suicidal Attempts: None   Violent Behaviors: None    Past Medical History:   Past Medical History:  Diagnosis Date  . Anxiety   . Arthritis    in back .   Marland Kitchen Borderline diabetes   . Chronic abdominal pain   . Chronic back pain   . Chronic constipation   . Chronic nausea   . Depression   . GERD (gastroesophageal reflux disease)   . High cholesterol   . Hypertension   . Noncompliance with medication regimen   . Pneumonia   . Poor historian   . Shortness of breath     even walking causes sob...    History of Loss of Consciousness:  No Seizure History:  No Cardiac History:  No Allergies:  No Known Allergies Current Medications:  Current Outpatient Prescriptions  Medication Sig Dispense Refill  . ALPRAZolam (XANAX) 0.5 MG tablet Take 1 tablet (0.5 mg total) by mouth 3 (three) times daily. 90 tablet 2  . lisinopril (PRINIVIL,ZESTRIL) 10 MG tablet Take 10 mg by mouth daily.    . nebivolol (BYSTOLIC) 5 MG tablet Take 5 mg by mouth daily.    Marland Kitchen omeprazole (PRILOSEC) 20 MG capsule Take 1 capsule (20 mg total) by mouth daily. 90 capsule 1  . sertraline (ZOLOFT) 50 MG tablet Take 1 tablet (50 mg total) by mouth daily. 30 tablet 2  .  megestrol (MEGACE) 40 MG/ML suspension Take by mouth daily.     No current facility-administered medications for this visit.     Previous Psychotropic Medications:  Medication Dose   Xanax   0.25 mg every 8 hours as needed                      Substance Abuse History in the last 12 months: Substance Age of 1st Use Last Use Amount Specific Type  Nicotine      Alcohol      Cannabis      Opiates      Cocaine      Methamphetamines      LSD      Ecstasy      Benzodiazepines  Caffeine      Inhalants      Others:                          Medical Consequences of Substance Abuse: n/a  Legal Consequences of Substance Abuse: n/a  Family Consequences of Substance Abuse: n/a  Blackouts:  No DT's:  No Withdrawal Symptoms:  No None  Social History: Current Place of Residence: [place] of Birth: Unknown Family Members: Lives with son Marital Status:  Widowed Children:   Sons: 1  Daughters: 1 Relationships:  Education:  [employer] Problems/Performance:  Religious Beliefs/Practices: Unknown History of Abuse: none Pensions consultant; Manufacturing engineer History:  None. Legal History: None Hobbies/Interests: Church  Family History:   Family History  Problem Relation Age of Onset  . Colon cancer Neg Hx     Mental Status Examination/Evaluation: Objective:  Appearance:   She is neatly dressed.   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 depression, single episode with psychotic features and somatization disorder  AXIS I Major Depression, single episode with psychotic features, and somatization disorder   AXIS II Deferred  AXIS III Past Medical History:  Diagnosis Date  . Anxiety   . Arthritis    in back .   Marland Kitchen Borderline diabetes   . Chronic abdominal pain   . Chronic back pain   . Chronic constipation   . Chronic nausea   . Depression   . GERD (gastroesophageal reflux disease)   . High cholesterol   . Hypertension   . Noncompliance with medication regimen   . Pneumonia   . Poor historian   . Shortness of breath     even walking causes sob...      AXIS IV other psychosocial or environmental problems  AXIS V 11-20 some danger of hurting self or others possible OR occasionally fails to maintain minimal personal hygiene OR gross impairment in communication   Treatment Plan/Recommendations:  Plan of Care: Medication management   Laboratory:    Psychotherapy: She is scheduled to see Peggy Bynum   Medications:  She willcontinue Zoloft 50 mg daily for depression and anxiety. She'll continue Xanax to 0.5 mg 3 times a day for anxiety. For now we will stop gabapentin   Routine PRN Medications:  No  Consultations:   Safety Concerns:  She denies any thoughts or plans to hurt self or others   Other:  She'll return in 4 weeks but call sooner if her condition continues to deteriorate.     [name], MD 8/28/20182:38 PM

## 2017-03-11 ENCOUNTER — Ambulatory Visit (INDEPENDENT_AMBULATORY_CARE_PROVIDER_SITE_OTHER): Payer: Self-pay | Admitting: Gastroenterology

## 2017-03-11 ENCOUNTER — Encounter: Payer: Self-pay | Admitting: Gastroenterology

## 2017-03-11 VITALS — BP 106/65 | HR 75 | Temp 97.5°F | Ht 67.0 in | Wt 152.2 lb

## 2017-03-11 DIAGNOSIS — R109 Unspecified abdominal pain: Secondary | ICD-10-CM

## 2017-03-11 DIAGNOSIS — R1011 Right upper quadrant pain: Secondary | ICD-10-CM

## 2017-03-11 DIAGNOSIS — R399 Unspecified symptoms and signs involving the genitourinary system: Secondary | ICD-10-CM

## 2017-03-11 DIAGNOSIS — G8929 Other chronic pain: Secondary | ICD-10-CM

## 2017-03-11 MED ORDER — LIDOCAINE VISCOUS 2 % MT SOLN: 15.0000 mL | Freq: Four times a day (QID) | OROMUCOSAL | 0 refills | Status: DC | PRN

## 2017-03-11 NOTE — Patient Instructions (Signed)
We have scheduled a HIDA scan to evaluate your gallbladder.  We will repeat liver numbers in October.   I have sent in a medication for you to take every 6 hours as needed for abdominal pain.   We will check a urinalysis today.

## 2017-03-11 NOTE — Progress Notes (Signed)
Referring Provider: Celene Squibb, MD Primary Care Physician:  Celene Squibb, MD  Primary GI: Dr. Gala Romney   Chief Complaint  Patient presents with  . Abdominal Pain  . Constipation    HPI:   Audrey Johnson is a 76 y.o. female presenting today with a history of chronic abdominal pain evaluated extensively in the past. Documented weight loss. EGD normal. CTA without evidence of mesenteric ischemia. US abdomen normal. No HIDA on file. Slight bump in transaminases noted recently with normal lipase. Weight stable from Aug 2018 visit. Returns in follow-up after EGD.   Noting lower abdominal discomfort after urinating. Urgency with urination. Present for a few weeks.  Has upper abdominal pain that radiates bilateral sides. Feels sore all over. Pain noted after eating. Only eats small amounts. Poor appetite. Eats just enough to take her medication.     Past Medical History:  Diagnosis Date  . Anxiety   . Arthritis    in back .   Marland Kitchen Borderline diabetes   . Chronic abdominal pain   . Chronic back pain   . Chronic constipation   . Chronic nausea   . Depression   . GERD (gastroesophageal reflux disease)   . High cholesterol   . Hypertension   . Noncompliance with medication regimen   . Pneumonia   . Poor historian   . Shortness of breath     even walking causes sob...     Past Surgical History:  Procedure Laterality Date  . ABDOMINAL HYSTERECTOMY    . BREAST SURGERY  unknown (many yrs)   excision  of lump right breast  was per pt "nothing"   . CARDIAC CATHETERIZATION    . CATARACT EXTRACTION W/PHACO Left 02/21/2015   Procedure: CATARACT EXTRACTION PHACO AND INTRAOCULAR LENS PLACEMENT LEFT EYE CDE=5.86;  Surgeon: Tonny Branch, MD;  Location: [facility];  Service: Ophthalmology;  Laterality: Left;  . CATARACT EXTRACTION W/PHACO Right 03/14/2015   Procedure: CATARACT EXTRACTION PHACO AND INTRAOCULAR LENS PLACEMENT ([place]);  Surgeon: Tonny Branch, MD;  Location: [facility];  Service:  Ophthalmology;  Laterality: Right;  CDE:6.36  . COLONOSCOPY  04/16/2007   RMR: Normal rectum and colon  . COLONOSCOPY WITH ESOPHAGOGASTRODUODENOSCOPY (EGD) N/A 01/08/2013   QAS:TMHD erosive reflux esophagitis. Negative H.pylori. Small hiatal hernia-s/p bx (gastritis)-TCS/Colonic polyp-hyperplastic  . DILATION AND CURETTAGE OF UTERUS    . ESOPHAGOGASTRODUODENOSCOPY (EGD) WITH PROPOFOL N/A 02/14/2017   DR. Rourk: normal esophagus, small hiatal hernia  . LEFT HEART CATHETERIZATION WITH CORONARY ANGIOGRAM N/A 06/05/2012   Procedure: LEFT HEART CATHETERIZATION WITH CORONARY ANGIOGRAM;  Surgeon: Hillary Bow, MD;  Location: [facility] CATH [facility];  Service: Cardiovascular;  Laterality: N/A;  . TOE SURGERY Right    5th toe, not sure what was wrong with it    Current Outpatient Prescriptions  Medication Sig Dispense Refill  . ALPRAZolam (XANAX) 0.5 MG tablet Take 1 tablet (0.5 mg total) by mouth 3 (three) times daily. (Patient taking differently: Take 0.5 mg by mouth 3 (three) times daily. Takes 2-3 times a day) 90 tablet 2  . gabapentin (NEURONTIN) 100 MG capsule Take 100 mg by mouth 3 (three) times daily.    Marland Kitchen lisinopril (PRINIVIL,ZESTRIL) 10 MG tablet Take 10 mg by mouth daily.    . megestrol (MEGACE) 40 MG/ML suspension Take by mouth daily.    . nebivolol (BYSTOLIC) 5 MG tablet Take 5 mg by mouth daily.    Marland Kitchen omeprazole (PRILOSEC) 20 MG capsule Take 1 capsule (  20 mg total) by mouth daily. 90 capsule 1  . sertraline (ZOLOFT) 50 MG tablet Take 1 tablet (50 mg total) by mouth daily. 30 tablet 2  . lidocaine (XYLOCAINE) 2 % solution Use as directed 15 mLs in the mouth or throat every 6 (six) hours as needed for mouth pain. 500 mL 0   No current facility-administered medications for this visit.     Allergies as of 03/11/2017  . (No Known Allergies)    Family History  Problem Relation Age of Onset  . Colon cancer Neg Hx     Social History   Social History  . Marital status: Widowed    Spouse  name: N/A  . Number of children: N/A  . Years of education: N/A   Social History Main Topics  . Smoking status: Never Smoker  . Smokeless tobacco: Never Used     Comment: Never smoked  . Alcohol use No  . Drug use: No  . Sexual activity: No   Other Topics Concern  . None   Social History Narrative  . None    Review of Systems: Gen: see HPI  CV: Denies chest pain, palpitations, syncope, peripheral edema, and claudication. Resp: Denies dyspnea at rest, cough, wheezing, coughing up blood, and pleurisy. GI: see HPI  Derm: Denies rash, itching, dry skin Psych: Denies depression, anxiety, memory loss, confusion. No homicidal or suicidal ideation.  Heme: Denies bruising, bleeding, and enlarged lymph nodes.  Physical Exam: BP 106/65   Pulse 75   Temp (!) 97.5 F (36.4 C) (Oral)   Ht 5\' 7"  (1.702 m)   Wt 152 lb 3.2 oz (69 kg)   BMI 23.84 kg/m  General:   Alert and oriented. No distress noted. Pleasant and cooperative.  Head:  Normocephalic and atraumatic. Eyes:  Conjuctiva clear without scleral icterus. Mouth:  Oral mucosa pink and moist.  Abdomen:  +BS, soft, TTP diffusely but without rebound or guarding. non-distended. . No HSM or masses noted. Msk:  Symmetrical without gross deformities. Normal posture. Extremities:  Without edema. Neurologic:  Alert and  oriented x4 Psych:  Alert and cooperative. Normal mood and affect.  Lab Results  Component Value Date   ALT 67 (H) 02/14/2017   AST 38 02/14/2017   ALKPHOS 95 02/14/2017   BILITOT 0.8 02/14/2017   Lab Results  Component Value Date   CREATININE 0.97 02/08/2017   BUN 13 02/08/2017   NA 140 02/08/2017   K 3.8 02/08/2017   CL 111 02/08/2017   CO2 21 (L) 02/08/2017   Lab Results  Component Value Date   WBC 7.5 01/14/2017   HGB 13.9 01/14/2017   HCT 41.5 01/14/2017   MCV 86.6 01/14/2017   PLT 509 (H) 01/14/2017

## 2017-03-12 ENCOUNTER — Encounter: Payer: Self-pay | Admitting: Gastroenterology

## 2017-03-12 NOTE — Assessment & Plan Note (Signed)
Check UA and culture now.

## 2017-03-12 NOTE — Progress Notes (Signed)
cc'ed to pcp °

## 2017-03-12 NOTE — Assessment & Plan Note (Signed)
Thoroughly evaluated thus far to include EGD, US abdomen, CTA. Needs HIDA to assess for biliary dyskinesia. Continue Prilosec once daily. Add viscous lidocaine. Repeat LFTs in October.

## 2017-03-13 LAB — URINALYSIS W MICROSCOPIC + REFLEX CULTURE
Bacteria, UA: NONE SEEN /HPF
Bilirubin Urine: NEGATIVE
Glucose, UA: NEGATIVE
Hgb urine dipstick: NEGATIVE
Hyaline Cast: NONE SEEN /LPF
Ketones, ur: NEGATIVE
Nitrites, Initial: NEGATIVE
Protein, ur: NEGATIVE
RBC / HPF: NONE SEEN /HPF (ref 0–2)
Specific Gravity, Urine: 1.017 (ref 1.001–1.03)
pH: 6.5 (ref 5.0–8.0)

## 2017-03-13 LAB — URINE CULTURE
MICRO NUMBER:: 80999016
Result:: NO GROWTH
SPECIMEN QUALITY:: ADEQUATE

## 2017-03-13 LAB — CULTURE INDICATED

## 2017-03-13 NOTE — Progress Notes (Signed)
Looks like probable UTI> Waiting on culture. How long does this take to return?

## 2017-03-14 NOTE — Progress Notes (Signed)
She has leukocytes but no nitrites, and her culture is negative. She needs to see PCP as soon as she can to have further evaluation.

## 2017-03-18 ENCOUNTER — Encounter (HOSPITAL_COMMUNITY): Payer: Self-pay

## 2017-03-18 ENCOUNTER — Encounter (HOSPITAL_COMMUNITY)
Admission: RE | Admit: 2017-03-18 | Discharge: 2017-03-18 | Disposition: A | Discharge status: Home / Self Care | Payer: Medicare HMO | Source: Ambulatory Visit | Attending: Gastroenterology | Admitting: Gastroenterology

## 2017-03-18 DIAGNOSIS — R1011 Right upper quadrant pain: Secondary | ICD-10-CM | POA: Insufficient documentation

## 2017-03-18 MED ORDER — TECHNETIUM TC 99M MEBROFENIN IV KIT
5.0000 | PACK | Freq: Once | INTRAVENOUS | Status: AC | PRN
  Administered 2017-03-18: 5.3 via INTRAVENOUS

## 2017-03-19 ENCOUNTER — Telehealth: Payer: Self-pay

## 2017-03-19 NOTE — Telephone Encounter (Signed)
Pt is requesting her test results from yesterday.  Pt said she did have pain when she had the test done.

## 2017-03-20 ENCOUNTER — Other Ambulatory Visit: Payer: Self-pay

## 2017-03-20 DIAGNOSIS — R109 Unspecified abdominal pain: Secondary | ICD-10-CM

## 2017-03-20 NOTE — Telephone Encounter (Signed)
See result note.  

## 2017-03-20 NOTE — Progress Notes (Signed)
She has a normal EF. She did note pain during procedure. She has had an extensive evaluation with [place]. The only thing left is to refer to Dr. Arnoldo Morale. She may be dealing with chronic abdominal pain even after cholecystectomy, but she does endorse biliary-type symptoms after eating and CTA, EGD are unrevealing.

## 2017-03-26 ENCOUNTER — Ambulatory Visit (INDEPENDENT_AMBULATORY_CARE_PROVIDER_SITE_OTHER): Payer: Medicare HMO | Admitting: Psychiatry

## 2017-03-26 ENCOUNTER — Encounter (HOSPITAL_COMMUNITY): Payer: Self-pay | Admitting: Psychiatry

## 2017-03-26 VITALS — BP 114/56 | HR 76 | Ht 67.0 in | Wt 150.0 lb

## 2017-03-26 DIAGNOSIS — M2550 Pain in unspecified joint: Secondary | ICD-10-CM

## 2017-03-26 DIAGNOSIS — F321 Major depressive disorder, single episode, moderate: Secondary | ICD-10-CM | POA: Diagnosis not present

## 2017-03-26 DIAGNOSIS — R109 Unspecified abdominal pain: Secondary | ICD-10-CM

## 2017-03-26 DIAGNOSIS — F459 Somatoform disorder, unspecified: Secondary | ICD-10-CM

## 2017-03-26 DIAGNOSIS — R5383 Other fatigue: Secondary | ICD-10-CM | POA: Diagnosis not present

## 2017-03-26 DIAGNOSIS — G4700 Insomnia, unspecified: Secondary | ICD-10-CM

## 2017-03-26 DIAGNOSIS — R4584 Anhedonia: Secondary | ICD-10-CM | POA: Diagnosis not present

## 2017-03-26 MED ORDER — SERTRALINE HCL 50 MG PO TABS: 50.0000 mg | ORAL_TABLET | Freq: Every day | ORAL | 2 refills | Status: DC

## 2017-03-26 MED ORDER — ALPRAZOLAM 0.5 MG PO TABS: 0.5000 mg | ORAL_TABLET | Freq: Three times a day (TID) | ORAL | 2 refills | Status: DC

## 2017-03-26 MED ORDER — GABAPENTIN 100 MG PO CAPS: 100.0000 mg | ORAL_CAPSULE | Freq: Three times a day (TID) | ORAL | 2 refills | Status: DC

## 2017-03-26 NOTE — Progress Notes (Signed)
Patient ID: Audrey Johnson, female   DOB: 1941-01-10, 76 y.o.   MRN: [id number] Patient ID: Audrey Johnson, female   DOB: 05-03-1941, 76 y.o.   MRN: [id number] Patient ID: Audrey Johnson, female   DOB: 1941-06-27, 76 y.o.   MRN: [id number] Patient ID: Audrey Johnson, female   DOB: 04/25/1941, 77 y.o.   MRN: [id number] Patient ID: Audrey Johnson, female   DOB: June 24, 1941, 76 y.o.   MRN: [id number] Patient ID: Audrey Johnson, female   DOB: 1941-06-11, 76 y.o.   MRN: [id number] Patient ID: Audrey Johnson, female   DOB: June 04, 1941, 76 y.o.   MRN: [id number] Patient ID: Audrey Johnson, female   DOB: 22-Jan-1941, 76 y.o.   MRN: [id number] Patient ID: Audrey Johnson, female   DOB: [birth year], 76 y.o.   MRN: [id number] Patient ID: Audrey Johnson, female   DOB: 06/19/1941, 76 y.o.   MRN: [id number] Patient ID: Audrey Johnson, female   DOB: 1941/03/12, 76 y.o.   MRN: [id number] Patient ID: Audrey Johnson, female   DOB: Jun 21, 1941, 76 y.o.   MRN: [id number] Patient ID: Audrey Johnson, female   DOB: [birth year], 76 y.o.   MRN: [id number] Patient ID: Audrey Johnson, female   DOB: 08-05-1940, 76 y.o.   MRN: [id number]  Psychiatric Assessment Adult  Patient Identification:  Audrey Johnson Date of Evaluation:  03/26/2017 Chief Complaint: I am doing a little better History of Chief Complaint:   Chief Complaint  Patient presents with  . Depression  . Anxiety  . Follow-up    Depression         Associated symptoms include appetite change.  Past medical history includes anxiety.   Anxiety     Altered Mental Status  Associated symptoms include joint swelling.   this patient is a 76 year old widowed black female who lives alonein [place].The patient used to work in a [employer] but has been retired for several years.  The patient was referred by Dr. Nevada Crane, her primary Dr. Apparently she's not had any history of mental health symptoms until about 2 months  ago. She started to have severe stomach pain and has gone to several emergency room this in the local area. She's had 9 emergency room visits in the last couple of months. Her last one was yesterday. She's had 3 abdominal CTs in the last month, and ultrasound and blood work. Nothing significantly wrong has been found.  The patient is on numerous medications for her stomach but they don't seem to help. She was prescribed Lexapro by her primary doctor but she refused to take it.  she is taking a low dose of Xanax  She returns after 6 weeks. She states that her stomach still hurts but she is doing a little bit better with it. The GI doctors have run all the tests are going to be able to do and they haven't found anything wrong. She recently had a HIDA scan which showed normal ejection fraction. They're thinking of referring her back to her surgeon but there is really nothing else to be done. She often complains of constipation and her primary doctor gave her Metamucil and MiraLAX to try and she is hopeful that this will help. Her mood seems to be a little bit better and she is getting out more. She doesn't know what to attribute this to but continues taking the Zoloft and other medications as prescribed Review of Systems  Constitutional: Positive for appetite change and unexpected  weight change.  Musculoskeletal: Positive for joint swelling.  Psychiatric/Behavioral: Positive for depression.   Physical Exam not done  Depressive Symptoms: depressed mood, anhedonia, insomnia, psychomotor agitation, difficulty concentrating, hopelessness, suicidal thoughts with specific plan, anxiety, loss of energy/fatigue, weight loss,  (Hypo) Manic Symptoms:   Elevated Mood:  No Irritable Mood:  Yes Grandiosity:  No Distractibility:  No Labiality of Mood:  Yes Delusions:  Yes Hallucinations:  Yes Impulsivity:  No Sexually Inappropriate Behavior:  No Financial Extravagance:  No Flight of Ideas:   No  Anxiety Symptoms: Excessive Worry:  Yes Panic Symptoms:  No Agoraphobia:  No Obsessive Compulsive: No  Symptoms: None, Specific Phobias:  No Social Anxiety:  No  Psychotic Symptoms:  Hallucinations: Yes Visual Delusions:  Yes Paranoia:  No   Ideas of Reference:  No  PTSD Symptoms: Ever had a traumatic exposure:  No Had a traumatic exposure in the last month:  No Re-experiencing: No None Hypervigilance:  No Hyperarousal: No None Avoidance: No None  Traumatic Brain Injury: No   Past Psychiatric History: Diagnosis: None   Hospitalizations: None   Outpatient Care: None   Substance Abuse Care: None   Self-Mutilation: None   Suicidal Attempts: None   Violent Behaviors: None    Past Medical History:   Past Medical History:  Diagnosis Date  . Anxiety   . Arthritis    in back .   Marland Kitchen Borderline diabetes   . Chronic abdominal pain   . Chronic back pain   . Chronic constipation   . Chronic nausea   . Depression   . GERD (gastroesophageal reflux disease)   . High cholesterol   . Hypertension   . Noncompliance with medication regimen   . Pneumonia   . Poor historian   . Shortness of breath     even walking causes sob...    History of Loss of Consciousness:  No Seizure History:  No Cardiac History:  No Allergies:  No Known Allergies Current Medications:  Current Outpatient Prescriptions  Medication Sig Dispense Refill  . ALPRAZolam (XANAX) 0.5 MG tablet Take 1 tablet (0.5 mg total) by mouth 3 (three) times daily. 90 tablet 2  . gabapentin (NEURONTIN) 100 MG capsule Take 1 capsule (100 mg total) by mouth 3 (three) times daily. 90 capsule 2  . lidocaine (XYLOCAINE) 2 % solution Use as directed 15 mLs in the mouth or throat every 6 (six) hours as needed for mouth pain. 500 mL 0  . lisinopril (PRINIVIL,ZESTRIL) 10 MG tablet Take 10 mg by mouth daily.    . megestrol (MEGACE) 40 MG/ML suspension Take by mouth daily.    . nebivolol (BYSTOLIC) 5 MG tablet Take 5 mg by  mouth daily.    Marland Kitchen omeprazole (PRILOSEC) 20 MG capsule Take 1 capsule (20 mg total) by mouth daily. 90 capsule 1  . sertraline (ZOLOFT) 50 MG tablet Take 1 tablet (50 mg total) by mouth daily. 30 tablet 2   No current facility-administered medications for this visit.     Previous Psychotropic Medications:  Medication Dose   Xanax   0.25 mg every 8 hours as needed                      Substance Abuse History in the last 12 months: Substance Age of 1st Use Last Use Amount Specific Type  Nicotine      Alcohol      Cannabis      Opiates  Cocaine      Methamphetamines      LSD      Ecstasy      Benzodiazepines      Caffeine      Inhalants      Others:                          Medical Consequences of Substance Abuse: n/a  Legal Consequences of Substance Abuse: n/a  Family Consequences of Substance Abuse: n/a  Blackouts:  No DT's:  No Withdrawal Symptoms:  No None  Social History: Current Place of Residence: [place] of Birth: Unknown Family Members: Lives with son Marital Status:  Widowed Children:   Sons: 1  Daughters: 1 Relationships:  Education:  [employer] Problems/Performance:  Religious Beliefs/Practices: Unknown History of Abuse: none Pensions consultant; Manufacturing engineer History:  None. Legal History: None Hobbies/Interests: Church  Family History:   Family History  Problem Relation Age of Onset  . Colon cancer Neg Hx     Mental Status Examination/Evaluation: Objective:  Appearance:   She is neatly dressed.   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 depression, single episode with psychotic features and somatization disorder  AXIS I Major Depression, single episode with psychotic features, and somatization disorder   AXIS II Deferred  AXIS III Past Medical History:  Diagnosis Date  . Anxiety   . Arthritis    in back .   Marland Kitchen Borderline diabetes   . Chronic abdominal pain   . Chronic back pain   . Chronic constipation   . Chronic nausea   . Depression   . GERD (gastroesophageal reflux disease)   . High cholesterol   . Hypertension   . Noncompliance with medication regimen   . Pneumonia   . Poor historian   . Shortness of breath     even walking causes sob...      AXIS IV other psychosocial or environmental problems  AXIS V 11-20 some danger of hurting self or others possible OR occasionally fails to maintain minimal personal hygiene OR gross impairment in communication   Treatment Plan/Recommendations:  Plan of Care: Medication management   Laboratory:    Psychotherapy: She is scheduled to see Peggy Bynum   Medications:  She willcontinue Zoloft 50 mg daily for depression and anxiety. She'll continue Xanax to 0.5 mg 3 times a day for anxiety.   Routine PRN Medications:  No  Consultations:   Safety Concerns:  She denies any thoughts or plans to hurt self or others   Other:  She'll return in 2 months but call sooner if her condition continues to deteriorate.     [name], MD 9/25/20181:58 PM

## 2017-03-27 ENCOUNTER — Telehealth: Payer: Self-pay

## 2017-03-27 NOTE — Telephone Encounter (Signed)
Audrey Johnson at Dr. Arnoldo Morale office called. Pt didn't want appt with Dr. Arnoldo Morale because she doesn't want to have her gallbladder removed. Routing to AB as FYI.

## 2017-03-29 ENCOUNTER — Other Ambulatory Visit (HOSPITAL_COMMUNITY): Payer: Self-pay | Admitting: Dermatology

## 2017-03-29 DIAGNOSIS — Z1231 Encounter for screening mammogram for malignant neoplasm of breast: Secondary | ICD-10-CM

## 2017-04-01 ENCOUNTER — Telehealth: Payer: Self-pay | Admitting: Internal Medicine

## 2017-04-01 NOTE — Telephone Encounter (Signed)
Routing to AB.  

## 2017-04-01 NOTE — Telephone Encounter (Signed)
PATIENT CALLED AND STATED WE REFERRED HER TO A SURGEON TO REMOVE HER GALLBLADDER AND SHE IS NOT GOING TO HAVE IT DONE BECAUSE SHE STATED SHE WAS BETTER

## 2017-04-02 ENCOUNTER — Encounter: Payer: Self-pay | Admitting: Internal Medicine

## 2017-04-02 NOTE — Telephone Encounter (Signed)
Noted  

## 2017-04-02 NOTE — Telephone Encounter (Signed)
Well that's great! Needs follow-up with [place] in 6 weeks.

## 2017-04-02 NOTE — Telephone Encounter (Signed)
PATIENT SCHEDULED  °

## 2017-05-01 ENCOUNTER — Ambulatory Visit (HOSPITAL_COMMUNITY)
Admission: RE | Admit: 2017-05-01 | Discharge: 2017-05-01 | Disposition: A | Discharge status: Home / Self Care | Payer: Medicare HMO | Source: Ambulatory Visit | Attending: Dermatology | Admitting: Dermatology

## 2017-05-01 ENCOUNTER — Other Ambulatory Visit (HOSPITAL_COMMUNITY): Payer: Self-pay | Admitting: Dermatology

## 2017-05-01 DIAGNOSIS — Z1231 Encounter for screening mammogram for malignant neoplasm of breast: Secondary | ICD-10-CM | POA: Insufficient documentation

## 2017-05-07 ENCOUNTER — Telehealth: Payer: Self-pay | Admitting: Internal Medicine

## 2017-05-07 ENCOUNTER — Other Ambulatory Visit: Payer: Self-pay | Admitting: *Deleted

## 2017-05-07 ENCOUNTER — Other Ambulatory Visit (HOSPITAL_COMMUNITY): Payer: Self-pay | Admitting: Internal Medicine

## 2017-05-07 DIAGNOSIS — R109 Unspecified abdominal pain: Secondary | ICD-10-CM

## 2017-05-07 DIAGNOSIS — N6310 Unspecified lump in the right breast, unspecified quadrant: Secondary | ICD-10-CM

## 2017-05-07 NOTE — Telephone Encounter (Signed)
Spoke with patient and she is now ready to surgery. I have re placed this referral as old one was closed. She is aware she will be contacted with an appointment

## 2017-05-07 NOTE — Telephone Encounter (Signed)
Pt called to say that we had referred her to a surgeon and she got scared and cancelled it. Now she is wanting to reschedule it. She has an OV with [place] on 11/28 also. Please call her at [phone]

## 2017-05-09 ENCOUNTER — Ambulatory Visit: Payer: Medicare HMO | Admitting: General Surgery

## 2017-05-09 ENCOUNTER — Encounter: Payer: Self-pay | Admitting: General Surgery

## 2017-05-09 VITALS — BP 136/70 | HR 81 | Temp 97.3°F | Resp 18 | Ht 64.0 in | Wt 156.0 lb

## 2017-05-09 DIAGNOSIS — R1084 Generalized abdominal pain: Secondary | ICD-10-CM | POA: Diagnosis not present

## 2017-05-09 NOTE — Patient Instructions (Signed)
Biliary Colic, Adult °Biliary colic is severe pain caused by a problem with a small organ in the upper right part of your belly (gallbladder). The gallbladder stores a digestive fluid produced in the liver (bile) that helps the body break down fat. Bile and other digestive enzymes are carried from the liver to the small intestine though tube-like structures (bile ducts). The gallbladder and the bile ducts form the biliary tract. °Sometimes hard deposits of digestive fluids form in the gallbladder (gallstones) and block the flow of bile from the gallbladder, causing biliary colic. This condition is also called a gallbladder attack. Gallstones can be as small as a grain of sand or as big as a golf ball. There could be just one gallstone in the gallbladder, or there could be many. °What are the causes? °Biliary colic is usually caused by gallstones. Less often, a tumor could block the flow of bile from the gallbladder and trigger biliary colic. °What increases the risk? °This condition is more likely to develop in: °· Women. °· People of Hispanic descent. °· People with a family history of gallstones. °· People who are obese. °· People who suddenly or quickly lose weight. °· People who eat a high-calorie, low-fiber diet that is rich in refined carbs (carbohydrates), such as white bread and white rice. °· People who have an intestinal disease that affects nutrient absorption, such as Crohn disease. °· People who have a metabolic condition, such as metabolic syndrome or diabetes. ° °What are the signs or symptoms? °Severe pain in the upper right side of the belly is the main symptom of biliary colic. You may feel this pain below the chest but above the hip. This pain often occurs at night or after eating a very fatty meal. This pain may get worse for up to an hour and last as long as 12 hours. In most cases, the pain fades (subsides) within a couple hours. °Other symptoms of this condition include: °· Nausea and  vomiting. °· Pain under the right shoulder. ° °How is this diagnosed? °This condition is diagnosed based on your medical history, your symptoms, and a physical exam. You may have tests, including: °· Blood tests to rule out infection or inflammation of the bile ducts, gallbladder, pancreas, or liver. °· Imaging studies such as: °? Ultrasound. °? CT scan. °? MRI. ° °In some cases, you may need to have an imaging study done using a small amount of radioactive material (nuclear medicine) to confirm the diagnosis. °How is this treated? °Treatment for this condition may include medicine to relieve your pain or nausea. If you have gallstones that are causing biliary colic, you may need surgery to remove the gallbladder (cholecystectomy). Gallstones can also be dissolved gradually with medicine. It may take months or years before the gallstones are completely gone. °Follow these instructions at home: °· Take over-the-counter and prescription medicines only as told by your health care provider. °· Drink enough fluid to keep your urine clear or pale yellow. °· Follow instructions from your health care provider about eating or drinking restrictions. These may include avoiding: °? Fatty, greasy, and fried foods. °? Any foods that make the pain worse. °? Overeating. °? Having a large meal after not eating for a while. °· Keep all follow-up visits as told by your health care provider. This is important. °How is this prevented? °Steps to prevent this condition include: °· Maintaining a healthy body weight. °· Getting regular exercise. °· Eating a healthy, high-fiber, low-fat diet. °· Limiting   how much sugar and refined carbs you eat, such as sweets, white flour, and white rice. ° °Contact a health care provider if: °· Your pain lasts more than 5 hours. °· You vomit. °· You have a fever and chills. °· Your pain gets worse. °Get help right away if: °· Your skin or the whites of your eyes look yellow (jaundice). °· Your have  tea-colored urine and light-colored stools. °· You are dizzy or you faint. °This information is not intended to replace advice given to you by your health care provider. Make sure you discuss any questions you have with your health care provider. °Document Released: 11/19/2005 Document Revised: 02/14/2016 Document Reviewed: 01/02/2016 °Elsevier Interactive Patient Education © 2018 [employer] ° °

## 2017-05-10 NOTE — Progress Notes (Signed)
Audrey Johnson History and Physical  Reason for Referral: Chronic Abdominal Pain Referring Physician:  [facility] Johnson, Roseanne Kaufman NP  Chief Complaint    Abdominal Pain      Audrey Johnson is a 76 y.o. female.  HPI: [name] is a 76 yo pleasant lady with a history of chronic abdominal pain for several years. She has had an extensive workup for this chronic abdominal pain over multiple years including a repeat EGD, repeat CTA for mesenteric ischemia, US/ HIDA to assess the gallbladder.  Her pain has been in the left lower quadrant, the right upper quadrant, and pelvis. She comes into my clinic today reporting lower abdominal pain when she has to urinate in the but only at night. She reports the pain as pressure in her pelvis. She reports that this pain is not present in the day and quotes her daytime pain at a 0 and reports night time pain at a 10.  This happens at about 3AM when she gets up to urinate. She says in the past she has had RUQ pain that came on with eating, but is not experiencing that type of pain at this time but it is overall improved Her [place] and HIDA were both normal without stones and with a normal EF.  The pain has been present for years and is associated currently with using the restroom at night before bed.    She is also have a significant amount of gas. She does not have intercourse and does not recall ever having pain with intercourse.   Past Medical History:  Diagnosis Date  . Anxiety   . Arthritis    in back .   Marland Kitchen Borderline diabetes   . Chronic abdominal pain   . Chronic back pain   . Chronic constipation   . Chronic nausea   . Depression   . GERD (gastroesophageal reflux disease)   . High cholesterol   . Hypertension   . Noncompliance with medication regimen   . Pneumonia   . Poor historian   . Shortness of breath     even walking causes sob...     Past Surgical History:  Procedure Laterality Date  .  ABDOMINAL HYSTERECTOMY    . BREAST SURGERY  unknown (many yrs)   excision  of lump right breast  was per pt "nothing"   . CARDIAC CATHETERIZATION    . COLONOSCOPY  04/16/2007   RMR: Normal rectum and colon  . DILATION AND CURETTAGE OF UTERUS    . TOE SURGERY Right    5th toe, not sure what was wrong with it    Family History  Problem Relation Age of Onset  . Colon cancer Neg Hx     Social History   Tobacco Use  . Smoking status: Never Smoker  . Smokeless tobacco: Never Used  . Tobacco comment: Never smoked  Substance Use Topics  . Alcohol use: No  . Drug use: No    Medications: I have reviewed the patient's current medications. Allergies as of 05/09/2017   No Known Allergies     Medication List        Accurate as of 05/09/17 11:59 PM. Always use your most recent med list.          ALPRAZolam 0.5 MG tablet Commonly known as:  XANAX Take 1 tablet (0.5 mg total) by mouth 3 (three) times daily.   gabapentin 100 MG capsule Commonly known as:  NEURONTIN Take 1 capsule (100  mg total) by mouth 3 (three) times daily.   lidocaine 2 % solution Commonly known as:  XYLOCAINE Use as directed 15 mLs in the mouth or throat every 6 (six) hours as needed for mouth pain.   lisinopril 10 MG tablet Commonly known as:  PRINIVIL,ZESTRIL Take 10 mg by mouth daily.   megestrol 40 MG/ML suspension Commonly known as:  MEGACE Take by mouth daily.   nebivolol 5 MG tablet Commonly known as:  BYSTOLIC Take 5 mg by mouth daily.   omeprazole 20 MG capsule Commonly known as:  PRILOSEC Take 1 capsule (20 mg total) by mouth daily.   sertraline 50 MG tablet Commonly known as:  ZOLOFT Take 1 tablet (50 mg total) by mouth daily.        ROS:  A comprehensive review of systems was negative except for: Constitutional: positive for fevers Respiratory: positive for SOB Gastrointestinal: positive for abdominal pain, reflux symptoms and gaseous Genitourinary: positive for  dysuria Musculoskeletal: positive for back pain  Blood pressure 136/70, pulse 81, temperature (!) 97.3 F (36.3 C), resp. rate 18, height 5\' 4"  (1.626 m), weight 156 lb (70.8 kg). Physical Exam  Constitutional: She is oriented to person, place, and time and well-developed, well-nourished, and in no distress.  Cardiovascular: Regular rhythm.  Pulmonary/Chest: Effort normal and breath sounds normal.  Abdominal: Soft. She exhibits no distension. There is tenderness. There is no rebound.  No RUQ tenderness, some tenderness in the lower quadrants with deep palpation   Musculoskeletal: Normal range of motion.  Neurological: She is alert and oriented to person, place, and time.  Skin: Skin is warm and dry.  Psychiatric: Mood, memory, affect and judgment normal.  Vitals reviewed.  Results: EGD 01/2017- small hiatal hernia, otherwise negative Colonoscopy 2014- benign polyp removed   [place]- no stones   Personally reviewed CTA- has some minor constipation but otherwise looked wnl  CTA-  IMPRESSION: VASCULAR  1. No acute vascular findings and no evidence of high-grade stenosis, occlusion or other focal vascular abnormality. 2. Incidentally noted vascular anatomical variants including a replaced left hepatic artery, and 2 right-sided renal arteries. 3.  Aortic Atherosclerosis (ICD10-170.0).  NON-VASCULAR  1. No focal abnormality in the abdomen or pelvis to explain the patient's clinical symptoms. 2. Multilevel degenerative disc disease which is advanced at L1-L2 with evidence of central stenosis, and moderate at L4-L5 and L5-S1.  Assessment & Plan:  HAELEY Johnson is a 76 y.o. female with chronic abdominal pain. Her pain is mostly in the pelvis and at night after urinating and it only occurs at night.  This pain is atypical, and located in her pelvis for the most part at this time. It does not occur all the time.  Given that she does not have any history of pain with intercourse,  unlikely for it to be any type of pelvic dysfunction.    -Recommended probiotics/ activia for the excessive gas  -Do not recommend removing the  Gallbladder as it is not without risks, and I do not see a true indication to remove it at this time.  -Discussed this with the patient in the office and also called her to further discuss after I reviewed the chart more thoroughly -Follow up PRN  All questions were answered to the satisfaction of the patient.  Virl Cagey 05/10/2017, 2:05 PM

## 2017-05-14 ENCOUNTER — Ambulatory Visit (HOSPITAL_COMMUNITY)
Admission: RE | Admit: 2017-05-14 | Discharge: 2017-05-14 | Disposition: A | Discharge status: Home / Self Care | Payer: Medicare HMO | Source: Ambulatory Visit | Attending: Internal Medicine | Admitting: Internal Medicine

## 2017-05-14 DIAGNOSIS — R928 Other abnormal and inconclusive findings on diagnostic imaging of breast: Secondary | ICD-10-CM | POA: Insufficient documentation

## 2017-05-14 DIAGNOSIS — N6489 Other specified disorders of breast: Secondary | ICD-10-CM | POA: Diagnosis not present

## 2017-05-14 DIAGNOSIS — R922 Inconclusive mammogram: Secondary | ICD-10-CM | POA: Diagnosis not present

## 2017-05-14 DIAGNOSIS — N6310 Unspecified lump in the right breast, unspecified quadrant: Secondary | ICD-10-CM

## 2017-05-16 ENCOUNTER — Other Ambulatory Visit: Payer: Self-pay | Admitting: Pharmacy Technician

## 2017-05-16 NOTE — Patient Outreach (Signed)
[facility]) Care Management  05/16/2017  SAFIATOU ISLAM [phone] [id number]   Successful Outreach call to Ms. Philipp Ovens, HIPAA identifiers verified. Contacted patient in regards to Lisinopril 10mg  medication adherence. Patient states that she is not taking 1 and 1/2 tablets daily. I asked patient if she had any issues picking up her meds or taking them. Patient stated that her daughter keeps her meds at her home and fills her pill boxes for her.  Maud Deed [place], [place] Management [phone]

## 2017-05-22 ENCOUNTER — Encounter (HOSPITAL_COMMUNITY): Payer: Self-pay | Admitting: Psychiatry

## 2017-05-22 ENCOUNTER — Ambulatory Visit (INDEPENDENT_AMBULATORY_CARE_PROVIDER_SITE_OTHER): Payer: Medicare HMO | Admitting: Psychiatry

## 2017-05-22 VITALS — BP 124/66 | HR 91 | Ht 64.0 in | Wt 155.0 lb

## 2017-05-22 DIAGNOSIS — Z79899 Other long term (current) drug therapy: Secondary | ICD-10-CM | POA: Diagnosis not present

## 2017-05-22 DIAGNOSIS — F321 Major depressive disorder, single episode, moderate: Secondary | ICD-10-CM | POA: Diagnosis not present

## 2017-05-22 DIAGNOSIS — F4541 Pain disorder exclusively related to psychological factors: Secondary | ICD-10-CM | POA: Diagnosis not present

## 2017-05-22 MED ORDER — ALPRAZOLAM 0.5 MG PO TABS
0.5000 mg | ORAL_TABLET | Freq: Three times a day (TID) | ORAL | 2 refills | Status: DC
Start: 2017-05-22 — End: 2017-08-28

## 2017-05-22 MED ORDER — GABAPENTIN 100 MG PO CAPS: 100.0000 mg | ORAL_CAPSULE | Freq: Three times a day (TID) | ORAL | 2 refills | Status: DC

## 2017-05-22 MED ORDER — SERTRALINE HCL 50 MG PO TABS: 50.0000 mg | ORAL_TABLET | Freq: Every day | ORAL | 2 refills | Status: DC

## 2017-05-22 NOTE — Progress Notes (Signed)
[facility] MD/PA/NP OP Progress Note  05/22/2017 3:01 PM Audrey Johnson  MRN:  [id number]  Chief Complaint:  HPI: this patient is a 76 year old widowed black female who lives alonein [place].The patient used to work in a [employer] but has been retired for several years.  The patient was referred by Dr. Nevada Crane, her primary Dr. Apparently she's not had any history of mental health symptoms until about 2 months ago. She started to have severe stomach pain and has gone to several emergency room this in the local area. She's had 9 emergency room visits in the last couple of months. Her last one was yesterday. She's had 3 abdominal CTs in the last month, and ultrasound and blood work. Nothing significantly wrong has been found.  The patient is on numerous medications for her stomach but they don't seem to help. She was prescribed Lexapro by her primary doctor but she refused to take it. she is taking a low dose of Xanax  The patient returns after 2 months.  For the most part she is doing better she was complaining of pelvic pain and saw an general surgeon but she did not think the patient needed any surgery particularly not cholecystectomy.  The patient states she still has some abdominal pain but she continues to eat pretty well.  There are certain foods that make it worse and she eats a fairly bland diet.  She states that her mood is good and that she has been staying really busy raking her yard.  Her energy is fair.  She only sleeps about 5 hours a night but is not tired.  She still feels that her medications have been helpful  Visit Diagnosis:    ICD-10-CM   1. Major depressive disorder, single episode, moderate (HCC) F32.1     Past Psychiatric History: 1 psychiatric hospitalization for severe depression and anxiety manifested by stomach pain and inability to eat  Past Medical History:  Past Medical History:  Diagnosis Date  . Anxiety   . Arthritis    in back .   Marland Kitchen Borderline diabetes   .  Chronic abdominal pain   . Chronic back pain   . Chronic constipation   . Chronic nausea   . Depression   . GERD (gastroesophageal reflux disease)   . High cholesterol   . Hypertension   . Noncompliance with medication regimen   . Pneumonia   . Poor historian   . Shortness of breath     even walking causes sob...     Past Surgical History:  Procedure Laterality Date  . ABDOMINAL HYSTERECTOMY    . BREAST SURGERY  unknown (many yrs)   excision  of lump right breast  was per pt "nothing"   . CARDIAC CATHETERIZATION    . CATARACT EXTRACTION W/PHACO Left 02/21/2015   Procedure: CATARACT EXTRACTION PHACO AND INTRAOCULAR LENS PLACEMENT LEFT EYE CDE=5.86;  Surgeon: Tonny Branch, MD;  Location: [facility];  Service: Ophthalmology;  Laterality: Left;  . CATARACT EXTRACTION W/PHACO Right 03/14/2015   Procedure: CATARACT EXTRACTION PHACO AND INTRAOCULAR LENS PLACEMENT ([place]);  Surgeon: Tonny Branch, MD;  Location: [facility];  Service: Ophthalmology;  Laterality: Right;  CDE:6.36  . COLONOSCOPY  04/16/2007   RMR: Normal rectum and colon  . COLONOSCOPY WITH ESOPHAGOGASTRODUODENOSCOPY (EGD) N/A 01/08/2013   QZE:SPQZ erosive reflux esophagitis. Negative H.pylori. Small hiatal hernia-s/p bx (gastritis)-TCS/Colonic polyp-hyperplastic  . DILATION AND CURETTAGE OF UTERUS    . ESOPHAGOGASTRODUODENOSCOPY (EGD) WITH PROPOFOL N/A 02/14/2017   DR.  Rourk: normal esophagus, small hiatal hernia  . LEFT HEART CATHETERIZATION WITH CORONARY ANGIOGRAM N/A 06/05/2012   Procedure: LEFT HEART CATHETERIZATION WITH CORONARY ANGIOGRAM;  Surgeon: Hillary Bow, MD;  Location: [facility] CATH LAB;  Service: Cardiovascular;  Laterality: N/A;  . TOE SURGERY Right    5th toe, not sure what was wrong with it    Family Psychiatric History: None  Family History:  Family History  Problem Relation Age of Onset  . Colon cancer Neg Hx     Social History:  Social History   Socioeconomic History  . Marital status: Widowed    Spouse name:  None  . Number of children: None  . Years of education: None  . Highest education level: None  Social Needs  . Financial resource strain: None  . Food insecurity - worry: None  . Food insecurity - inability: None  . Transportation needs - medical: None  . Transportation needs - non-medical: None  Occupational History  . None  Tobacco Use  . Smoking status: Never Smoker  . Smokeless tobacco: Never Used  . Tobacco comment: Never smoked  Substance and Sexual Activity  . Alcohol use: No  . Drug use: No  . Sexual activity: No    Birth control/protection: Surgical  Other Topics Concern  . None  Social History Narrative  . None    Allergies: No Known Allergies  Metabolic Disorder Labs: No results found for: HGBA1C, MPG No results found for: PROLACTIN No results found for: CHOL, TRIG, HDL, CHOLHDL, VLDL, LDLCALC Lab Results  Component Value Date   TSH 2.821 07/17/2010    Therapeutic Level Labs: No results found for: LITHIUM No results found for: VALPROATE No components found for:  CBMZ  Current Medications: Current Outpatient Medications  Medication Sig Dispense Refill  . ALPRAZolam (XANAX) 0.5 MG tablet Take 1 tablet (0.5 mg total) by mouth 3 (three) times daily. 90 tablet 2  . gabapentin (NEURONTIN) 100 MG capsule Take 1 capsule (100 mg total) by mouth 3 (three) times daily. 90 capsule 2  . lidocaine (XYLOCAINE) 2 % solution Use as directed 15 mLs in the mouth or throat every 6 (six) hours as needed for mouth pain. 500 mL 0  . lisinopril (PRINIVIL,ZESTRIL) 10 MG tablet Take 10 mg by mouth daily.    . megestrol (MEGACE) 40 MG/ML suspension Take by mouth daily.    . nebivolol (BYSTOLIC) 5 MG tablet Take 5 mg by mouth daily.    Marland Kitchen omeprazole (PRILOSEC) 20 MG capsule Take 1 capsule (20 mg total) by mouth daily. 90 capsule 1  . sertraline (ZOLOFT) 50 MG tablet Take 1 tablet (50 mg total) by mouth daily. 30 tablet 2   No current facility-administered medications for this  visit.      Musculoskeletal: Strength & Muscle Tone: within normal limits Gait & Station: unsteady Patient leans: N/A  Psychiatric Specialty Exam: Review of Systems  Gastrointestinal: Positive for abdominal pain.  Genitourinary: Positive for dysuria.  Musculoskeletal: Positive for joint pain.  All other systems reviewed and are negative.   Blood pressure 124/66, pulse 91, height 5\' 4"  (1.626 m), weight 155 lb (70.3 kg), SpO2 98 %.Body mass index is 26.61 kg/m.  General Appearance: Casual and Fairly Groomed  Eye Contact:  Good  Speech:  Clear and Coherent  Volume:  Normal  Mood:  Anxious  Affect:  Appropriate and Congruent  Thought Process:  Goal Directed  Orientation:  Full (Time, Place, and Person)  Thought Content: Rumination  Suicidal Thoughts:  No  Homicidal Thoughts:  No  Memory:  Immediate;   Good Recent;   Fair Remote;   Fair  Judgement:  Fair  Insight:  Lacking  Psychomotor Activity:  Decreased  Concentration:  Concentration: Good and Attention Span: Good  Recall:  Good  Fund of Knowledge: Good  Language: Good  Akathisia:  No  Handed:  Right  AIMS (if indicated): not done  Assets:  Communication Skills Desire for Improvement Resilience Social Support Talents/Skills  ADL's:  Intact  Cognition: WNL  Sleep:  Fair   Screenings:   Assessment and Plan: Patient is a 76 year old female with a history of significant somatizations disorder which manifested at one point and severe stomach pain inability to eat and weight loss.  She is eating better now and has maintained her weight and no longer has any somatic delusions or hallucinations.  She seems to be stable on her current medications so she will continue gabapentin 100 mg 3 times daily Zoloft 50 mg daily and Xanax 0.5 mg 3 times daily.  She will return to see me in 3 months   [name], MD 05/22/2017, 3:01 PM

## 2017-05-29 ENCOUNTER — Ambulatory Visit: Payer: Medicare HMO | Admitting: Gastroenterology

## 2017-07-04 ENCOUNTER — Other Ambulatory Visit: Payer: Self-pay | Admitting: Nurse Practitioner

## 2017-08-22 ENCOUNTER — Ambulatory Visit (HOSPITAL_COMMUNITY): Payer: Medicare HMO | Admitting: Psychiatry

## 2017-08-22 DIAGNOSIS — E119 Type 2 diabetes mellitus without complications: Secondary | ICD-10-CM | POA: Diagnosis not present

## 2017-08-22 DIAGNOSIS — I10 Essential (primary) hypertension: Secondary | ICD-10-CM | POA: Diagnosis not present

## 2017-08-22 DIAGNOSIS — E782 Mixed hyperlipidemia: Secondary | ICD-10-CM | POA: Diagnosis not present

## 2017-08-26 DIAGNOSIS — I1310 Hypertensive heart and chronic kidney disease without heart failure, with stage 1 through stage 4 chronic kidney disease, or unspecified chronic kidney disease: Secondary | ICD-10-CM | POA: Diagnosis not present

## 2017-08-26 DIAGNOSIS — F411 Generalized anxiety disorder: Secondary | ICD-10-CM | POA: Diagnosis not present

## 2017-08-26 DIAGNOSIS — I10 Essential (primary) hypertension: Secondary | ICD-10-CM | POA: Diagnosis not present

## 2017-08-26 DIAGNOSIS — R7301 Impaired fasting glucose: Secondary | ICD-10-CM | POA: Diagnosis not present

## 2017-08-26 DIAGNOSIS — E782 Mixed hyperlipidemia: Secondary | ICD-10-CM | POA: Diagnosis not present

## 2017-08-26 DIAGNOSIS — D696 Thrombocytopenia, unspecified: Secondary | ICD-10-CM | POA: Diagnosis not present

## 2017-08-26 DIAGNOSIS — N183 Chronic kidney disease, stage 3 (moderate): Secondary | ICD-10-CM | POA: Diagnosis not present

## 2017-08-28 ENCOUNTER — Ambulatory Visit (HOSPITAL_COMMUNITY): Payer: Medicare HMO | Admitting: Psychiatry

## 2017-08-28 ENCOUNTER — Encounter (HOSPITAL_COMMUNITY): Payer: Self-pay | Admitting: Psychiatry

## 2017-08-28 VITALS — BP 144/75 | HR 68 | Ht 64.0 in | Wt 157.0 lb

## 2017-08-28 DIAGNOSIS — M2550 Pain in unspecified joint: Secondary | ICD-10-CM | POA: Diagnosis not present

## 2017-08-28 DIAGNOSIS — F321 Major depressive disorder, single episode, moderate: Secondary | ICD-10-CM

## 2017-08-28 DIAGNOSIS — R109 Unspecified abdominal pain: Secondary | ICD-10-CM

## 2017-08-28 MED ORDER — GABAPENTIN 100 MG PO CAPS: 100.0000 mg | ORAL_CAPSULE | Freq: Three times a day (TID) | ORAL | 2 refills | Status: DC

## 2017-08-28 MED ORDER — ALPRAZOLAM 0.5 MG PO TABS: 0.5000 mg | ORAL_TABLET | Freq: Three times a day (TID) | ORAL | 2 refills | Status: DC

## 2017-08-28 MED ORDER — SERTRALINE HCL 50 MG PO TABS: 50.0000 mg | ORAL_TABLET | Freq: Every day | ORAL | 2 refills | Status: DC

## 2017-08-28 NOTE — Progress Notes (Signed)
[place] MD/PA/NP OP Progress Note  08/28/2017 11:05 AM Audrey Johnson  MRN:  [id number]  Chief Complaint:  Chief Complaint    Depression; Anxiety; Follow-up     HPI: This patient is a 77 year old widowed black female who lives alone in [place].  She is to work at a [employer] but is currently retired.  The patient returns for follow-up today for treatment of depression and anxiety.  A few years ago she got depressed and very delusional.  She was hospitalized for a brief time and since then has done fairly well as an outpatient.  She had significant somatizations problems with recurrent stomach pain.  Her stomach still hurts occasionally in the mornings.  Her main concern today is that she has no appetite.  She continues to lose weight.  She denies being depressed or anxious and she is sleeping well.  She states that Megace helped her in the past but her insurance currently will not pay for it.  She is asked her family physician to help get prior authorization but so far no success. Visit Diagnosis:    ICD-10-CM   1. Major depressive disorder, single episode, moderate (HCC) F32.1     Past Psychiatric History: One prior hospitalization for psychotic depression  Past Medical History:  Past Medical History:  Diagnosis Date  . Anxiety   . Arthritis    in back .   Marland Kitchen Borderline diabetes   . Chronic abdominal pain   . Chronic back pain   . Chronic constipation   . Chronic nausea   . Depression   . GERD (gastroesophageal reflux disease)   . High cholesterol   . Hypertension   . Noncompliance with medication regimen   . Pneumonia   . Poor historian   . Shortness of breath     even walking causes sob...     Past Surgical History:  Procedure Laterality Date  . ABDOMINAL HYSTERECTOMY    . BREAST SURGERY  unknown (many yrs)   excision  of lump right breast  was per pt "nothing"   . CARDIAC CATHETERIZATION    . CATARACT EXTRACTION W/PHACO Left 02/21/2015   Procedure: CATARACT  EXTRACTION PHACO AND INTRAOCULAR LENS PLACEMENT LEFT EYE CDE=5.86;  Surgeon: Tonny Branch, MD;  Location: [facility];  Service: Ophthalmology;  Laterality: Left;  . CATARACT EXTRACTION W/PHACO Right 03/14/2015   Procedure: CATARACT EXTRACTION PHACO AND INTRAOCULAR LENS PLACEMENT ([place]);  Surgeon: Tonny Branch, MD;  Location: [facility];  Service: Ophthalmology;  Laterality: Right;  CDE:6.36  . COLONOSCOPY  04/16/2007   RMR: Normal rectum and colon  . COLONOSCOPY WITH ESOPHAGOGASTRODUODENOSCOPY (EGD) N/A 01/08/2013   QPY:PPJK erosive reflux esophagitis. Negative H.pylori. Small hiatal hernia-s/p bx (gastritis)-TCS/Colonic polyp-hyperplastic  . DILATION AND CURETTAGE OF UTERUS    . ESOPHAGOGASTRODUODENOSCOPY (EGD) WITH PROPOFOL N/A 02/14/2017   DR. Rourk: normal esophagus, small hiatal hernia  . LEFT HEART CATHETERIZATION WITH CORONARY ANGIOGRAM N/A 06/05/2012   Procedure: LEFT HEART CATHETERIZATION WITH CORONARY ANGIOGRAM;  Surgeon: Hillary Bow, MD;  Location: [facility] CATH LAB;  Service: Cardiovascular;  Laterality: N/A;  . TOE SURGERY Right    5th toe, not sure what was wrong with it    Family Psychiatric History: None  Family History:  Family History  Problem Relation Age of Onset  . Colon cancer Neg Hx     Social History:  Social History   Socioeconomic History  . Marital status: Widowed    Spouse name: None  . Number of children: None  .  Years of education: None  . Highest education level: None  Social Needs  . Financial resource strain: None  . Food insecurity - worry: None  . Food insecurity - inability: None  . Transportation needs - medical: None  . Transportation needs - non-medical: None  Occupational History  . None  Tobacco Use  . Smoking status: Never Smoker  . Smokeless tobacco: Never Used  . Tobacco comment: Never smoked  Substance and Sexual Activity  . Alcohol use: No  . Drug use: No  . Sexual activity: No    Birth control/protection: Surgical  Other Topics Concern   . None  Social History Narrative  . None    Allergies: No Known Allergies  Metabolic Disorder Labs: No results found for: HGBA1C, MPG No results found for: PROLACTIN No results found for: CHOL, TRIG, HDL, CHOLHDL, VLDL, LDLCALC Lab Results  Component Value Date   TSH 2.821 07/17/2010    Therapeutic Level Labs: No results found for: LITHIUM No results found for: VALPROATE No components found for:  CBMZ  Current Medications: Current Outpatient Medications  Medication Sig Dispense Refill  . ALPRAZolam (XANAX) 0.5 MG tablet Take 1 tablet (0.5 mg total) by mouth 3 (three) times daily. 90 tablet 2  . gabapentin (NEURONTIN) 100 MG capsule Take 1 capsule (100 mg total) by mouth 3 (three) times daily. 90 capsule 2  . lidocaine (XYLOCAINE) 2 % solution Use as directed 15 mLs in the mouth or throat every 6 (six) hours as needed for mouth pain. 500 mL 0  . lisinopril (PRINIVIL,ZESTRIL) 10 MG tablet Take 10 mg by mouth daily.    . megestrol (MEGACE) 40 MG/ML suspension Take by mouth daily.    . nebivolol (BYSTOLIC) 5 MG tablet Take 5 mg by mouth daily.    Marland Kitchen omeprazole (PRILOSEC) 20 MG capsule TAKE 1 CAPSULE BY MOUTH EVERY DAY 90 capsule 1  . sertraline (ZOLOFT) 50 MG tablet Take 1 tablet (50 mg total) by mouth daily. 30 tablet 2   No current facility-administered medications for this visit.      Musculoskeletal: Strength & Muscle Tone: within normal limits Gait & Station: Limping due to knee pain Patient leans: N/A  Psychiatric Specialty Exam: Review of Systems  Constitutional: Positive for weight loss.  Gastrointestinal: Positive for abdominal pain.  Musculoskeletal: Positive for joint pain.    Blood pressure (!) 144/75, pulse 68, height 5\' 4"  (1.626 m), weight 157 lb (71.2 kg), SpO2 98 %.Body mass index is 26.95 kg/m.  General Appearance: Casual and Fairly Groomed  Eye Contact:  Good  Speech:  Clear and Coherent  Volume:  Normal  Mood:  Anxious  Affect:  Congruent   Thought Process:  Goal Directed  Orientation:  Full (Time, Place, and Person)  Thought Content: Rumination   Suicidal Thoughts:  No  Homicidal Thoughts:  No  Memory:  Immediate;   Good Recent;   Good Remote;   Fair  Judgement:  Fair  Insight:  Lacking  Psychomotor Activity:  Decreased  Concentration:  Concentration: Good and Attention Span: Good  Recall:  Good  Fund of Knowledge: Good  Language: Good  Akathisia:  No  Handed:  Right  AIMS (if indicated): not done  Assets:  Communication Skills Desire for Improvement Resilience Social Support Talents/Skills  ADL's:  Intact  Cognition: WNL  Sleep:  Good   Screenings:   Assessment and Plan: This patient is a 77 year old female with a history of psychotic depression.  She no longer has psychotic  symptoms and claims she is not depressed.  She sees Dr. Nadara Mustard for primary care and apparently was seen recently with a normal exam.  She still has very poor appetite.  She does think her medications for depression and anxiety have helped so we will continue Zoloft 50 mg daily, gabapentin 100 mg 3 times a day for anxiety and Xanax 0.5 mg 3 times a day for anxiety.  We will try to contact Dr. Lyman Speller office about reinstating the Megace.  She will return to see me in 3 months   [name], MD 08/28/2017, 11:05 AM

## 2017-09-14 ENCOUNTER — Other Ambulatory Visit: Payer: Self-pay

## 2017-09-14 ENCOUNTER — Emergency Department (HOSPITAL_COMMUNITY)
Admission: EM | Admit: 2017-09-14 | Discharge: 2017-09-14 | Disposition: A | Discharge status: Home / Self Care | Payer: Medicare HMO | Attending: Emergency Medicine | Admitting: Emergency Medicine

## 2017-09-14 ENCOUNTER — Emergency Department (HOSPITAL_COMMUNITY): Payer: Medicare HMO

## 2017-09-14 ENCOUNTER — Encounter (HOSPITAL_COMMUNITY): Payer: Self-pay | Admitting: Emergency Medicine

## 2017-09-14 DIAGNOSIS — E876 Hypokalemia: Secondary | ICD-10-CM | POA: Diagnosis not present

## 2017-09-14 DIAGNOSIS — R1084 Generalized abdominal pain: Secondary | ICD-10-CM | POA: Insufficient documentation

## 2017-09-14 DIAGNOSIS — I10 Essential (primary) hypertension: Secondary | ICD-10-CM | POA: Insufficient documentation

## 2017-09-14 DIAGNOSIS — R197 Diarrhea, unspecified: Secondary | ICD-10-CM | POA: Diagnosis not present

## 2017-09-14 DIAGNOSIS — R112 Nausea with vomiting, unspecified: Secondary | ICD-10-CM | POA: Insufficient documentation

## 2017-09-14 DIAGNOSIS — R1110 Vomiting, unspecified: Secondary | ICD-10-CM | POA: Diagnosis not present

## 2017-09-14 DIAGNOSIS — R109 Unspecified abdominal pain: Secondary | ICD-10-CM

## 2017-09-14 LAB — COMPREHENSIVE METABOLIC PANEL
ALT: 31 U/L (ref 14–54)
AST: 39 U/L (ref 15–41)
Albumin: 4.4 g/dL (ref 3.5–5.0)
Alkaline Phosphatase: 114 U/L (ref 38–126)
Anion gap: 18 — ABNORMAL HIGH (ref 5–15)
BUN: 11 mg/dL (ref 6–20)
CO2: 18 mmol/L — ABNORMAL LOW (ref 22–32)
Calcium: 10 mg/dL (ref 8.9–10.3)
Chloride: 105 mmol/L (ref 101–111)
Creatinine, Ser: 1.01 mg/dL — ABNORMAL HIGH (ref 0.44–1.00)
GFR calc Af Amer: >60 mL/min (ref >60)
GFR calc non Af Amer: 53 mL/min — ABNORMAL LOW (ref >60)
Glucose, Bld: 144 mg/dL — ABNORMAL HIGH (ref 65–99)
Potassium: 2.9 mmol/L — ABNORMAL LOW (ref 3.5–5.1)
Sodium: 141 mmol/L (ref 135–145)
Total Bilirubin: 1.4 mg/dL — ABNORMAL HIGH (ref 0.3–1.2)
Total Protein: 8.4 g/dL — ABNORMAL HIGH (ref 6.5–8.1)

## 2017-09-14 LAB — URINALYSIS, ROUTINE W REFLEX MICROSCOPIC
Bilirubin Urine: NEGATIVE
Glucose, UA: NEGATIVE mg/dL
Hgb urine dipstick: NEGATIVE
Ketones, ur: 5 mg/dL — AB
Leukocytes, UA: NEGATIVE
Nitrite: NEGATIVE
Protein, ur: NEGATIVE mg/dL
Specific Gravity, Urine: 1.043 — ABNORMAL HIGH (ref 1.005–1.030)
pH: 8 (ref 5.0–8.0)

## 2017-09-14 LAB — LACTIC ACID, PLASMA
Lactic Acid, Venous: 2.6 mmol/L (ref 0.5–1.9)
Lactic Acid, Venous: 1.8 mmol/L (ref 0.5–1.9)

## 2017-09-14 LAB — CBC
HCT: 46 % (ref 36.0–46.0)
Hemoglobin: 14.7 g/dL (ref 12.0–15.0)
MCH: 29.2 pg (ref 26.0–34.0)
MCHC: 32 g/dL (ref 30.0–36.0)
MCV: 91.5 fL (ref 78.0–100.0)
Platelets: ADEQUATE 10*3/uL (ref 150–400)
RBC: 5.03 MIL/uL (ref 3.87–5.11)
RDW: 13.6 % (ref 11.5–15.5)
WBC: 8.4 10*3/uL (ref 4.0–10.5)

## 2017-09-14 LAB — LIPASE, BLOOD: Lipase: 20 U/L (ref 11–51)

## 2017-09-14 MED ORDER — ONDANSETRON 4 MG PO TBDP
4.0000 mg | ORAL_TABLET | Freq: Once | ORAL | Status: AC | PRN
  Administered 2017-09-14: 4 mg via ORAL

## 2017-09-14 MED ORDER — POTASSIUM CHLORIDE CRYS ER 20 MEQ PO TBCR
40.0000 meq | EXTENDED_RELEASE_TABLET | Freq: Once | ORAL | Status: AC
  Administered 2017-09-14: 40 meq via ORAL
  Filled 2017-09-14: qty 2

## 2017-09-14 MED ORDER — SODIUM CHLORIDE 0.9 % IV BOLUS (SEPSIS)
1000.0000 mL | Freq: Once | INTRAVENOUS | Status: AC
  Administered 2017-09-14: 1000 mL via INTRAVENOUS

## 2017-09-14 MED ORDER — POTASSIUM CHLORIDE 10 MEQ/100ML IV SOLN
10.0000 meq | Freq: Once | INTRAVENOUS | Status: AC
  Administered 2017-09-14: 10 meq via INTRAVENOUS
  Filled 2017-09-14: qty 100

## 2017-09-14 MED ORDER — MAGNESIUM SULFATE 2 GM/50ML IV SOLN
2.0000 g | Freq: Once | INTRAVENOUS | Status: AC
  Administered 2017-09-14: 2 g via INTRAVENOUS
  Filled 2017-09-14: qty 50

## 2017-09-14 MED ORDER — POTASSIUM CHLORIDE CRYS ER 20 MEQ PO TBCR: 40.0000 meq | EXTENDED_RELEASE_TABLET | Freq: Two times a day (BID) | ORAL | Status: DC

## 2017-09-14 MED ORDER — IOPAMIDOL (ISOVUE-300) INJECTION 61%
100.0000 mL | Freq: Once | INTRAVENOUS | Status: AC | PRN
  Administered 2017-09-14: 100 mL via INTRAVENOUS

## 2017-09-14 MED ORDER — POTASSIUM CHLORIDE CRYS ER 20 MEQ PO TBCR: 40.0000 meq | EXTENDED_RELEASE_TABLET | Freq: Two times a day (BID) | ORAL | 0 refills | Status: DC

## 2017-09-14 MED ORDER — FENTANYL CITRATE (PF) 100 MCG/2ML IJ SOLN
50.0000 ug | INTRAMUSCULAR | Status: DC | PRN
  Administered 2017-09-14: 50 ug via INTRAVENOUS
  Filled 2017-09-14: qty 2

## 2017-09-14 MED ORDER — ONDANSETRON 4 MG PO TBDP
ORAL_TABLET | ORAL | Status: AC
  Filled 2017-09-14: qty 1

## 2017-09-14 MED ORDER — ONDANSETRON HCL 4 MG PO TABS: 4.0000 mg | ORAL_TABLET | Freq: Three times a day (TID) | ORAL | 0 refills | Status: DC | PRN

## 2017-09-14 NOTE — ED Triage Notes (Signed)
N/V/D and all over abd pain started this am.

## 2017-09-14 NOTE — ED Notes (Signed)
Pt ambulatory to bathroom without difficulty.  

## 2017-09-14 NOTE — ED Provider Notes (Signed)
Emergency Department Provider Note   I have reviewed the triage vital signs and the nursing notes.   HISTORY  Chief Complaint Emesis   HPI Audrey Johnson is a 77 y.o. female with multiple medical problems as documented below the presents emergency department today with an acute worsening of her abdominal pain.  Patient states that she always has some belly pain but this morning she had severe epigastric pain and progressively and quickly worsened to become a whole abdomen.  She states it feels a bit distended.  She has had multiple episodes of vomiting a couple episodes of diarrhea as well.  She states she never anything quite like this before.  No shortness of breath or diaphoresis.  She has no back pain.  She states that she does not drink alcohol, eat spicy or greasy foods recently.  No history of pancreatic problems.  No past history of abdominal surgeries however her past medical history does state that she has had a hysterectomy.  No sick contacts.  No suspicious food.  No fevers but states that she feels that she has had chills. No other associated or modifying symptoms.    Past Medical History:  Diagnosis Date  . Anxiety   . Arthritis    in back .   Marland Kitchen Borderline diabetes   . Chronic abdominal pain   . Chronic back pain   . Chronic constipation   . Chronic nausea   . Depression   . GERD (gastroesophageal reflux disease)   . High cholesterol   . Hypertension   . Noncompliance with medication regimen   . Pneumonia   . Poor historian   . Shortness of breath     even walking causes sob...     Patient Active Problem List   Diagnosis Date Noted  . Urinary tract infection symptoms 03/11/2017  . GERD (gastroesophageal reflux disease) 07/20/2013  . Abnormal LFTs 07/20/2013  . Constipation 07/20/2013  . Depression 04/24/2013  . Chronic abdominal pain 12/04/2012  . Dyspnea 05/19/2012  . Benign hypertension 05/19/2012  . Hyperlipidemia 05/19/2012    Past  Surgical History:  Procedure Laterality Date  . ABDOMINAL HYSTERECTOMY    . BREAST SURGERY  unknown (many yrs)   excision  of lump right breast  was per pt "nothing"   . CARDIAC CATHETERIZATION    . CATARACT EXTRACTION W/PHACO Left 02/21/2015   Procedure: CATARACT EXTRACTION PHACO AND INTRAOCULAR LENS PLACEMENT LEFT EYE CDE=5.86;  Surgeon: Tonny Branch, MD;  Location: [facility];  Service: Ophthalmology;  Laterality: Left;  . CATARACT EXTRACTION W/PHACO Right 03/14/2015   Procedure: CATARACT EXTRACTION PHACO AND INTRAOCULAR LENS PLACEMENT ([place]);  Surgeon: Tonny Branch, MD;  Location: [facility];  Service: Ophthalmology;  Laterality: Right;  CDE:6.36  . COLONOSCOPY  04/16/2007   RMR: Normal rectum and colon  . COLONOSCOPY WITH ESOPHAGOGASTRODUODENOSCOPY (EGD) N/A 01/08/2013   PJK:DTOI erosive reflux esophagitis. Negative H.pylori. Small hiatal hernia-s/p bx (gastritis)-TCS/Colonic polyp-hyperplastic  . DILATION AND CURETTAGE OF UTERUS    . ESOPHAGOGASTRODUODENOSCOPY (EGD) WITH PROPOFOL N/A 02/14/2017   DR. Rourk: normal esophagus, small hiatal hernia  . LEFT HEART CATHETERIZATION WITH CORONARY ANGIOGRAM N/A 06/05/2012   Procedure: LEFT HEART CATHETERIZATION WITH CORONARY ANGIOGRAM;  Surgeon: Hillary Bow, MD;  Location[facility] CATH LAB;  Service: Cardiovascular;  Laterality: N/A;  . TOE SURGERY Right    5th toe, not sure what was wrong with it    Current Outpatient Rx  . Order #: [id number] Class: Normal  . Order #:  [id number] Class: Normal  . Order #: [id number] Class: Historical Med  . Order #: [id number] Class: Normal  . Order #: [id number] Class: Normal  . Order #: [id number] Class: Normal  . Order #: [id number] Class: Print  . Order #: [id number] Class: Print    Allergies Patient has no known allergies.  Family History  Problem Relation Age of Onset  . Colon cancer Neg Hx     Social History Social History   Tobacco Use  . Smoking status: Never Smoker  . Smokeless tobacco: Never Used  . Tobacco  comment: Never smoked  Substance Use Topics  . Alcohol use: No  . Drug use: No    Review of Systems  All other systems negative except as documented in the HPI. All pertinent positives and negatives as reviewed in the HPI. ____________________________________________   PHYSICAL EXAM:  VITAL SIGNS: ED Triage Vitals  Enc Vitals Group     BP 09/14/17 1359 (!) 157/87     Pulse Rate 09/14/17 1359 89     Resp 09/14/17 1359 19     Temp 09/14/17 1359 98.5 F (36.9 C)     Temp Source 09/14/17 1359 Oral     SpO2 09/14/17 1359 100 %     Weight 09/14/17 1359 145 lb (65.8 kg)     Height 09/14/17 1359 5\' 4"  (1.626 m)     Head Circumference --      Peak Flow --      Pain Score 09/14/17 1400 10     Pain Loc --      Pain Edu? --      Excl. in [place]? --     Constitutional: Alert and oriented. Well appearing and in no acute distress. Eyes: Conjunctivae are normal. PERRL. EOMI. Head: Atraumatic. Nose: No congestion/rhinnorhea. Mouth/Throat: Mucous membranes are moist.  Oropharynx non-erythematous. Neck: No stridor.  No meningeal signs.   Cardiovascular: Normal rate, regular rhythm. Good peripheral circulation. Grossly normal heart sounds.   Respiratory: Normal respiratory effort.  No retractions. Lungs CTAB. Gastrointestinal: Soft but difusely tender without rebound. No distention.  Musculoskeletal: No lower extremity tenderness nor edema. No gross deformities of extremities. Neurologic:  Normal speech and language. No gross focal neurologic deficits are appreciated.  Skin:  Skin is warm, dry and intact. No rash noted.  ____________________________________________   LABS (all labs ordered are listed, but only abnormal results are displayed)  Labs Reviewed  COMPREHENSIVE METABOLIC PANEL - Abnormal; Notable for the following components:      Result Value   Potassium 2.9 (*)    CO2 18 (*)    Glucose, Bld 144 (*)    Creatinine, Ser 1.01 (*)    Total Protein 8.4 (*)    Total Bilirubin  1.4 (*)    GFR calc non Af Amer 53 (*)    Anion gap 18 (*)    All other components within normal limits  URINALYSIS, ROUTINE W REFLEX MICROSCOPIC - Abnormal; Notable for the following components:   Specific Gravity, Urine 1.043 (*)    Ketones, ur 5 (*)    All other components within normal limits  LACTIC ACID, PLASMA - Abnormal; Notable for the following components:   Lactic Acid, Venous 2.6 (*)    All other components within normal limits  LIPASE, BLOOD  CBC  LACTIC ACID, PLASMA   ____________________________________________  EKG   EKG Interpretation  Date/Time:  Saturday September 14 2017 15:55:06 EDT Ventricular Rate:  69 PR Interval:    QRS Duration: 76 QT Interval:  405 QTC Calculation:  434 R Axis:   67 Text Interpretation:  Sinus rhythm Borderline T wave abnormalities No significant change since last tracing Confirmed by Merrily Pew [phone]) on 09/14/2017 4:44:36 PM       ____________________________________________  RADIOLOGY  Ct Abdomen Pelvis W Contrast  Result Date: 09/14/2017 CLINICAL DATA:  78 year old female with history of nausea, vomiting and diarrhea with generalized abdominal pain this morning. EXAM: CT ABDOMEN AND PELVIS WITH CONTRAST TECHNIQUE: Multidetector CT imaging of the abdomen and pelvis was performed using the standard protocol following bolus administration of intravenous contrast. CONTRAST:  162mL ISOVUE-300 IOPAMIDOL (ISOVUE-300) INJECTION 61% COMPARISON:  CT the abdomen and pelvis 02/11/2017. FINDINGS: Lower chest: Unremarkable. Hepatobiliary: No suspicious cystic or solid hepatic lesions. No intra or extrahepatic biliary ductal dilatation. Gallbladder is normal in appearance. Pancreas: No pancreatic mass. No pancreatic ductal dilatation. No pancreatic or peripancreatic fluid or inflammatory changes. Spleen: Unremarkable. Adrenals/Urinary Tract: 1.4 cm low-attenuation lesion in the upper pole of the right kidney is compatible with a simple cyst. Left  kidney is normal in appearance. Bilateral adrenal glands are normal in appearance. No hydroureteronephrosis. Urinary bladder is normal in appearance. Stomach/Bowel: Normal appearance of the stomach. No pathologic dilatation of small bowel or colon. Normal appendix. Vascular/Lymphatic: Aortic atherosclerosis, without evidence of aneurysm or dissection in the abdominal or pelvic vasculature. No lymphadenopathy noted in the abdomen or pelvis. Reproductive: Status posthysterectomy. Ovaries are not confidently identified may be surgically absent or atrophic. Other: No significant volume of ascites.  No pneumoperitoneum. Musculoskeletal: There are no aggressive appearing lytic or blastic lesions noted in the visualized portions of the skeleton. IMPRESSION: 1. No acute findings are noted in the abdomen or pelvis to account for the patient's symptoms. 2. Aortic atherosclerosis. 3. Additional incidental findings, as above. Electronically Signed   By: Vinnie Langton M.D.   On: 09/14/2017 16:54    ____________________________________________   PROCEDURES  Procedure(s) performed:   Procedures   ____________________________________________   INITIAL IMPRESSION / ASSESSMENT AND PLAN / ED COURSE   Her history states that she has chronic abdominal pain however this sounds like it was different today so we will image her secondary to her diffuse tenderness and questionable distention.  Also add on a EKG to evaluate for cardiac causes.  Pain medicine, nausea medicine and fluids n.p.o. for now.  Unremarkable.  Pain has disappeared.  Initial lactic acid slightly high but improved on repeat.  Potassium repleted.  Repeat abdominal exam benign.  Tolerating p.o.  Unclear the cause of her symptoms but will continue follow with a primary doctor.   Pertinent labs & imaging results that were available during my care of the patient were reviewed by me and considered in my medical decision making (see chart for  details).  ____________________________________________  FINAL CLINICAL IMPRESSION(S) / ED DIAGNOSES  Final diagnoses:  Nausea vomiting and diarrhea  Abdominal pain, unspecified abdominal location  Hypokalemia     MEDICATIONS GIVEN DURING THIS VISIT:  Medications  fentaNYL (SUBLIMAZE) injection 50 mcg (50 mcg Intravenous Given 09/14/17 1549)  ondansetron (ZOFRAN-ODT) disintegrating tablet 4 mg (4 mg Oral Given 09/14/17 1403)  potassium chloride SA (K-DUR,KLOR-CON) CR tablet 40 mEq (40 mEq Oral Given 09/14/17 1634)  potassium chloride 10 mEq in 100 mL IVPB (0 mEq Intravenous Stopped 09/14/17 1738)  magnesium sulfate IVPB 2 g 50 mL (0 g Intravenous Stopped 09/14/17 1738)  sodium chloride 0.9 % bolus 1,000 mL (0 mLs Intravenous Stopped 09/14/17 1808)  iopamidol (ISOVUE-300) 61 % injection 100 mL (100 mLs Intravenous  Contrast Given 09/14/17 1614)     NEW OUTPATIENT MEDICATIONS STARTED DURING THIS VISIT:  New Prescriptions   ONDANSETRON (ZOFRAN) 4 MG TABLET    Take 1 tablet (4 mg total) by mouth every 8 (eight) hours as needed for nausea or vomiting.   POTASSIUM CHLORIDE SA (K-DUR,KLOR-CON) 20 MEQ TABLET    Take 2 tablets (40 mEq total) by mouth 2 (two) times daily for 7 days.    Note:  This note was prepared with assistance of Dragon voice recognition software. Occasional wrong-word or sound-a-like substitutions may have occurred due to the inherent limitations of voice recognition software.   Merrily Pew, MD 09/14/17 [phone]

## 2017-09-14 NOTE — ED Notes (Signed)
CRITICAL VALUE ALERT  Critical Value: Troponin 2.6  Date & Time Notied:  09/14/2017 at 1653  Provider Notified: Dr. Dayna Barker  Orders Received/Actions taken:

## 2017-09-17 ENCOUNTER — Ambulatory Visit: Payer: Self-pay | Admitting: Orthopedic Surgery

## 2017-09-23 DIAGNOSIS — E782 Mixed hyperlipidemia: Secondary | ICD-10-CM | POA: Diagnosis not present

## 2017-09-23 DIAGNOSIS — R110 Nausea: Secondary | ICD-10-CM | POA: Diagnosis not present

## 2017-09-23 DIAGNOSIS — F411 Generalized anxiety disorder: Secondary | ICD-10-CM | POA: Diagnosis not present

## 2017-09-23 DIAGNOSIS — R7301 Impaired fasting glucose: Secondary | ICD-10-CM | POA: Diagnosis not present

## 2017-09-23 DIAGNOSIS — R109 Unspecified abdominal pain: Secondary | ICD-10-CM | POA: Diagnosis not present

## 2017-09-23 DIAGNOSIS — I1310 Hypertensive heart and chronic kidney disease without heart failure, with stage 1 through stage 4 chronic kidney disease, or unspecified chronic kidney disease: Secondary | ICD-10-CM | POA: Diagnosis not present

## 2017-09-23 DIAGNOSIS — I10 Essential (primary) hypertension: Secondary | ICD-10-CM | POA: Diagnosis not present

## 2017-09-23 DIAGNOSIS — N183 Chronic kidney disease, stage 3 (moderate): Secondary | ICD-10-CM | POA: Diagnosis not present

## 2017-09-23 DIAGNOSIS — D696 Thrombocytopenia, unspecified: Secondary | ICD-10-CM | POA: Diagnosis not present

## 2017-09-24 ENCOUNTER — Ambulatory Visit (INDEPENDENT_AMBULATORY_CARE_PROVIDER_SITE_OTHER): Payer: Medicare HMO

## 2017-09-24 ENCOUNTER — Encounter: Payer: Self-pay | Admitting: Orthopedic Surgery

## 2017-09-24 ENCOUNTER — Telehealth: Payer: Self-pay | Admitting: Orthopedic Surgery

## 2017-09-24 ENCOUNTER — Ambulatory Visit (INDEPENDENT_AMBULATORY_CARE_PROVIDER_SITE_OTHER): Payer: Medicare HMO | Admitting: Orthopedic Surgery

## 2017-09-24 VITALS — BP 131/68 | HR 93 | Ht 64.0 in | Wt 150.0 lb

## 2017-09-24 DIAGNOSIS — M1711 Unilateral primary osteoarthritis, right knee: Secondary | ICD-10-CM

## 2017-09-24 DIAGNOSIS — M545 Low back pain: Secondary | ICD-10-CM

## 2017-09-24 DIAGNOSIS — M25511 Pain in right shoulder: Secondary | ICD-10-CM | POA: Diagnosis not present

## 2017-09-24 MED ORDER — TRAMADOL-ACETAMINOPHEN 37.5-325 MG PO TABS: 1.0000 | ORAL_TABLET | ORAL | 0 refills | Status: DC | PRN

## 2017-09-24 MED ORDER — PREDNISONE 10 MG PO TABS: 10.0000 mg | ORAL_TABLET | Freq: Two times a day (BID) | ORAL | 0 refills | Status: DC

## 2017-09-24 NOTE — Telephone Encounter (Signed)
Patient called following today's office visit - states her prescription not at [place], [place]. Chart notes indicate received.

## 2017-09-24 NOTE — Progress Notes (Signed)
Progress Note   Patient ID: Audrey Johnson, female   DOB: 1941/06/10, 77 y.o.   MRN: [id number]  Chief Complaint  Patient presents with  . Knee Pain    right      Audrey Johnson is a 77 y.o. female.   HPI She has chronic arthritis and lateral joint pain with valgus deformity and did not want to have surgery on her last visit.  She has been managed nonoperatively tramadol and injection and comes in for 47-month follow-up visit with symptoms of  Chronic posterior knee pain saying she is worse      Review of Systems  Constitutional: Negative for fever.  Respiratory: Positive for shortness of breath.   Cardiovascular: Negative for chest pain.  Gastrointestinal: Positive for abdominal pain.   Current Meds  Medication Sig  . ALPRAZolam (XANAX) 0.5 MG tablet Take 1 tablet (0.5 mg total) by mouth 3 (three) times daily.  Audrey Johnson BYSTOLIC 5 MG tablet   . gabapentin (NEURONTIN) 100 MG capsule Take 1 capsule (100 mg total) by mouth 3 (three) times daily.  Audrey Johnson lisinopril (PRINIVIL,ZESTRIL) 10 MG tablet Take 10 mg by mouth daily.  . ondansetron (ZOFRAN) 4 MG tablet Take 1 tablet (4 mg total) by mouth every 8 (eight) hours as needed for nausea or vomiting.  . sertraline (ZOLOFT) 50 MG tablet Take 1 tablet (50 mg total) by mouth daily.    Past Medical History:  Diagnosis Date  . Anxiety   . Arthritis    in back .   Audrey Johnson Borderline diabetes   . Chronic abdominal pain   . Chronic back pain   . Chronic constipation   . Chronic nausea   . Depression   . GERD (gastroesophageal reflux disease)   . High cholesterol   . Hypertension   . Noncompliance with medication regimen   . Pneumonia   . Poor historian   . Shortness of breath     even walking causes sob...      No Known Allergies  BP 131/68   Pulse 93   Ht 5\' 4"  (1.626 m)   Wt 150 lb (68 kg)   BMI 25.75 kg/m    Physical Exam  Constitutional: She is oriented to person, place, and time. She appears well-developed and  well-nourished.  Musculoskeletal:       Right knee: She exhibits effusion.  Neurological: She is alert and oriented to person, place, and time. Gait abnormal.  Cane limping cane in right hand  Psychiatric: She has a normal mood and affect. Judgment normal.  Vitals reviewed.   Right Knee Exam   Muscle Strength  The patient has normal right knee strength.  Tenderness  The patient is experiencing tenderness in the MCL and lateral joint line.  Range of Motion  Extension:  5 normal  Flexion: normal Right knee flexion: 125.   Tests  McMurray:  Medial - negative Lateral - negative Varus: negative Valgus: negative Drawer:  Anterior - negative    Posterior - negative  Other  Erythema: absent Scars: absent Sensation: normal Pulse: present Swelling: none Effusion: effusion present   Left Knee Exam   Muscle Strength  The patient has normal left knee strength.  Tenderness  The patient is experiencing no tenderness.   Range of Motion  Extension: normal  Flexion: normal   Tests  McMurray:  Medial - negative Lateral - negative Varus: negative Valgus: negative Drawer:  Anterior - negative     Posterior - negative  Other  Erythema: absent Scars: absent Sensation: normal Pulse: present Swelling: none       Medical decision-making  Imaging:   Ordered x-ray today right knee see the report  Summation valgus knee 20 degrees severe arthritis joint effusion  Encounter Diagnoses  Name Primary?  Audrey Johnson Arthritis of knee, right Yes  . Acute pain of right shoulder   . Acute low back pain, unspecified back pain laterality, with sciatica presence unspecified     She is not doing well she has shortness of breath recently seen for abdominal pain by Dr. Nevada Johnson, next appointment not until August.  She is ready for surgery in terms of knee pain but is not medically ready.  Her shortness of breath needs to be worked up and improved and her shoulder pain and back pain have to be  resolved as well as the abdominal pain  I gave her an injection in the right knee started her on Ultracet and prednisone for the shoulder and back and I will see her in 6 or 8 weeks to see if she is gotten any better medically  Procedure note right knee injection verbal consent was obtained to inject right knee joint  Timeout was completed to confirm the site of injection  The medications used were 40 mg of Depo-Medrol and 1% lidocaine 3 cc  Anesthesia was provided by ethyl chloride and the skin was prepped with alcohol.  After cleaning the skin with alcohol a 20-gauge needle was used to inject the right knee joint. There were no complications. A sterile bandage was applied.   Meds ordered this encounter  Medications  . traMADol-acetaminophen (ULTRACET) 37.5-325 MG tablet    Sig: Take 1 tablet by mouth every 4 (four) hours as needed.    Dispense:  30 tablet    Refill:  0  . predniSONE (DELTASONE) 10 MG tablet    Sig: Take 1 tablet (10 mg total) by mouth 2 (two) times daily with a meal.    Dispense:  60 tablet    Refill:  0     Audrey Abbott, MD 09/24/2017 10:48 AM

## 2017-10-07 ENCOUNTER — Other Ambulatory Visit (HOSPITAL_COMMUNITY): Payer: Self-pay | Admitting: Psychiatry

## 2017-10-07 MED ORDER — SERTRALINE HCL 50 MG PO TABS: 50.0000 mg | ORAL_TABLET | Freq: Every day | ORAL | 2 refills | Status: DC

## 2017-10-17 ENCOUNTER — Other Ambulatory Visit (HOSPITAL_COMMUNITY): Payer: Self-pay | Admitting: Nurse Practitioner

## 2017-10-17 ENCOUNTER — Ambulatory Visit (HOSPITAL_COMMUNITY)
Admission: RE | Admit: 2017-10-17 | Discharge: 2017-10-17 | Disposition: A | Discharge status: Home / Self Care | Payer: Medicare HMO | Source: Ambulatory Visit | Attending: Nurse Practitioner | Admitting: Nurse Practitioner

## 2017-10-17 DIAGNOSIS — I10 Essential (primary) hypertension: Secondary | ICD-10-CM | POA: Diagnosis not present

## 2017-10-17 DIAGNOSIS — N183 Chronic kidney disease, stage 3 (moderate): Secondary | ICD-10-CM | POA: Diagnosis not present

## 2017-10-17 DIAGNOSIS — E876 Hypokalemia: Secondary | ICD-10-CM | POA: Diagnosis not present

## 2017-10-17 DIAGNOSIS — I1310 Hypertensive heart and chronic kidney disease without heart failure, with stage 1 through stage 4 chronic kidney disease, or unspecified chronic kidney disease: Secondary | ICD-10-CM | POA: Diagnosis not present

## 2017-10-17 DIAGNOSIS — R109 Unspecified abdominal pain: Secondary | ICD-10-CM | POA: Diagnosis not present

## 2017-10-17 DIAGNOSIS — R0602 Shortness of breath: Secondary | ICD-10-CM

## 2017-10-17 DIAGNOSIS — R110 Nausea: Secondary | ICD-10-CM | POA: Diagnosis not present

## 2017-10-17 DIAGNOSIS — R7301 Impaired fasting glucose: Secondary | ICD-10-CM | POA: Diagnosis not present

## 2017-10-17 DIAGNOSIS — E782 Mixed hyperlipidemia: Secondary | ICD-10-CM | POA: Diagnosis not present

## 2017-10-18 ENCOUNTER — Other Ambulatory Visit: Payer: Self-pay | Admitting: Orthopedic Surgery

## 2017-10-18 DIAGNOSIS — M1711 Unilateral primary osteoarthritis, right knee: Secondary | ICD-10-CM

## 2017-11-14 DIAGNOSIS — R110 Nausea: Secondary | ICD-10-CM | POA: Diagnosis not present

## 2017-11-14 DIAGNOSIS — I10 Essential (primary) hypertension: Secondary | ICD-10-CM | POA: Diagnosis not present

## 2017-11-14 DIAGNOSIS — E876 Hypokalemia: Secondary | ICD-10-CM | POA: Diagnosis not present

## 2017-11-14 DIAGNOSIS — N183 Chronic kidney disease, stage 3 (moderate): Secondary | ICD-10-CM | POA: Diagnosis not present

## 2017-11-14 DIAGNOSIS — I1310 Hypertensive heart and chronic kidney disease without heart failure, with stage 1 through stage 4 chronic kidney disease, or unspecified chronic kidney disease: Secondary | ICD-10-CM | POA: Diagnosis not present

## 2017-11-14 DIAGNOSIS — R0602 Shortness of breath: Secondary | ICD-10-CM | POA: Diagnosis not present

## 2017-11-14 DIAGNOSIS — M25511 Pain in right shoulder: Secondary | ICD-10-CM | POA: Diagnosis not present

## 2017-11-14 DIAGNOSIS — Z6826 Body mass index (BMI) 26.0-26.9, adult: Secondary | ICD-10-CM | POA: Diagnosis not present

## 2017-11-14 DIAGNOSIS — R109 Unspecified abdominal pain: Secondary | ICD-10-CM | POA: Diagnosis not present

## 2017-11-20 ENCOUNTER — Ambulatory Visit: Payer: Self-pay | Admitting: Orthopedic Surgery

## 2017-11-27 ENCOUNTER — Ambulatory Visit (INDEPENDENT_AMBULATORY_CARE_PROVIDER_SITE_OTHER): Payer: Medicare HMO | Admitting: Psychiatry

## 2017-11-27 ENCOUNTER — Encounter (HOSPITAL_COMMUNITY): Payer: Self-pay | Admitting: Psychiatry

## 2017-11-27 VITALS — BP 113/72 | HR 78 | Ht 64.0 in | Wt 154.0 lb

## 2017-11-27 DIAGNOSIS — M2550 Pain in unspecified joint: Secondary | ICD-10-CM

## 2017-11-27 DIAGNOSIS — H5210 Myopia, unspecified eye: Secondary | ICD-10-CM | POA: Diagnosis not present

## 2017-11-27 DIAGNOSIS — F419 Anxiety disorder, unspecified: Secondary | ICD-10-CM | POA: Diagnosis not present

## 2017-11-27 DIAGNOSIS — F321 Major depressive disorder, single episode, moderate: Secondary | ICD-10-CM

## 2017-11-27 DIAGNOSIS — R109 Unspecified abdominal pain: Secondary | ICD-10-CM

## 2017-11-27 MED ORDER — GABAPENTIN 100 MG PO CAPS: 100.0000 mg | ORAL_CAPSULE | Freq: Three times a day (TID) | ORAL | 2 refills | Status: DC

## 2017-11-27 MED ORDER — SERTRALINE HCL 50 MG PO TABS: 50.0000 mg | ORAL_TABLET | Freq: Every day | ORAL | 2 refills | Status: DC

## 2017-11-27 MED ORDER — ALPRAZOLAM 0.5 MG PO TABS: 0.5000 mg | ORAL_TABLET | Freq: Every day | ORAL | 2 refills | Status: DC

## 2017-11-27 NOTE — Progress Notes (Signed)
[facility] MD/PA/NP OP Progress Note  11/27/2017 2:35 PM Audrey Johnson  MRN:  [id number]  Chief Complaint:  Chief Complaint    Depression; Anxiety; Follow-up     HPI: This patient is a 77 year old widowed black female who lives alone in [place].  She used to work in a [employer] but is currently retired  The patient returns for follow-up today for treatment of depression and anxiety.  A few years ago she got depressed and very delusional.  She was hospitalized for a brief time and since then has done well as an outpatient.  She continues to have some matization problems with recurrent stomach pain.  She states that her stomach still acts up but she is eating a little better and has gained about 9 pounds.  She is states that her appetite has improved.  She denies being significantly depressed or anxious and asked if she can take the Ativan only at bedtime because it makes her drowsy during the day.  I think this is reasonable.  She is getting out some with her family Visit Diagnosis:    ICD-10-CM   1. Major depressive disorder, single episode, moderate (HCC) F32.1     Past Psychiatric History: One prior hospitalization for psychotic depression  Past Medical History:  Past Medical History:  Diagnosis Date  . Anxiety   . Arthritis    in back .   Marland Kitchen Borderline diabetes   . Chronic abdominal pain   . Chronic back pain   . Chronic constipation   . Chronic nausea   . Depression   . GERD (gastroesophageal reflux disease)   . High cholesterol   . Hypertension   . Noncompliance with medication regimen   . Pneumonia   . Poor historian   . Shortness of breath     even walking causes sob...     Past Surgical History:  Procedure Laterality Date  . ABDOMINAL HYSTERECTOMY    . BREAST SURGERY  unknown (many yrs)   excision  of lump right breast  was per pt "nothing"   . CARDIAC CATHETERIZATION    . CATARACT EXTRACTION W/PHACO Left 02/21/2015   Procedure: CATARACT EXTRACTION PHACO AND  INTRAOCULAR LENS PLACEMENT LEFT EYE CDE=5.86;  Surgeon: Tonny Branch, MD;  Location: [facility];  Service: Ophthalmology;  Laterality: Left;  . CATARACT EXTRACTION W/PHACO Right 03/14/2015   Procedure: CATARACT EXTRACTION PHACO AND INTRAOCULAR LENS PLACEMENT ([place]);  Surgeon: Tonny Branch, MD;  Location: [facility];  Service: Ophthalmology;  Laterality: Right;  CDE:6.36  . COLONOSCOPY  04/16/2007   RMR: Normal rectum and colon  . COLONOSCOPY WITH ESOPHAGOGASTRODUODENOSCOPY (EGD) N/A 01/08/2013   WUJ:WJXB erosive reflux esophagitis. Negative H.pylori. Small hiatal hernia-s/p bx (gastritis)-TCS/Colonic polyp-hyperplastic  . DILATION AND CURETTAGE OF UTERUS    . ESOPHAGOGASTRODUODENOSCOPY (EGD) WITH PROPOFOL N/A 02/14/2017   DR. Rourk: normal esophagus, small hiatal hernia  . LEFT HEART CATHETERIZATION WITH CORONARY ANGIOGRAM N/A 06/05/2012   Procedure: LEFT HEART CATHETERIZATION WITH CORONARY ANGIOGRAM;  Surgeon: Hillary Bow, MD;  Location: [facility] CATH LAB;  Service: Cardiovascular;  Laterality: N/A;  . TOE SURGERY Right    5th toe, not sure what was wrong with it    Family Psychiatric History: none  Family History:  Family History  Problem Relation Age of Onset  . Colon cancer Neg Hx     Social History:  Social History   Socioeconomic History  . Marital status: Widowed    Spouse name: Not on file  . Number of  children: Not on file  . Years of education: Not on file  . Highest education level: Not on file  Occupational History  . Not on file  Social Needs  . Financial resource strain: Not on file  . Food insecurity:    Worry: Not on file    Inability: Not on file  . Transportation needs:    Medical: Not on file    Non-medical: Not on file  Tobacco Use  . Smoking status: Never Smoker  . Smokeless tobacco: Never Used  . Tobacco comment: Never smoked  Substance and Sexual Activity  . Alcohol use: No  . Drug use: No  . Sexual activity: Never    Birth control/protection: Surgical   Lifestyle  . Physical activity:    Days per week: Not on file    Minutes per session: Not on file  . Stress: Not on file  Relationships  . Social connections:    Talks on phone: Not on file    Gets together: Not on file    Attends religious service: Not on file    Active member of club or organization: Not on file    Attends meetings of clubs or organizations: Not on file    Relationship status: Not on file  Other Topics Concern  . Not on file  Social History Narrative  . Not on file    Allergies: No Known Allergies  Metabolic Disorder Labs: No results found for: HGBA1C, MPG No results found for: PROLACTIN No results found for: CHOL, TRIG, HDL, CHOLHDL, VLDL, LDLCALC Lab Results  Component Value Date   TSH 2.821 07/17/2010    Therapeutic Level Labs: No results found for: LITHIUM No results found for: VALPROATE No components found for:  CBMZ  Current Medications: Current Outpatient Medications  Medication Sig Dispense Refill  . ALPRAZolam (XANAX) 0.5 MG tablet Take 1 tablet (0.5 mg total) by mouth at bedtime. 30 tablet 2  . BYSTOLIC 5 MG tablet     . gabapentin (NEURONTIN) 100 MG capsule Take 1 capsule (100 mg total) by mouth 3 (three) times daily. 90 capsule 2  . lisinopril (PRINIVIL,ZESTRIL) 10 MG tablet Take 10 mg by mouth daily.    Marland Kitchen loratadine (CLARITIN) 10 MG tablet Take 10 mg by mouth daily.  3  . meloxicam (MOBIC) 15 MG tablet Take 15 mg by mouth daily.  1  . omeprazole (PRILOSEC) 20 MG capsule TAKE 1 CAPSULE BY MOUTH EVERY DAY 90 capsule 1  . sertraline (ZOLOFT) 50 MG tablet Take 1 tablet (50 mg total) by mouth daily. 90 tablet 2   No current facility-administered medications for this visit.      Musculoskeletal: Strength & Muscle Tone: decreased Gait & Station: normal Patient leans: N/A  Psychiatric Specialty Exam: Review of Systems  Gastrointestinal: Positive for abdominal pain.  Musculoskeletal: Positive for joint pain.  All other systems  reviewed and are negative.   Blood pressure 113/72, pulse 78, height 5\' 4"  (1.626 m), weight 154 lb (69.9 kg), SpO2 98 %.Body mass index is 26.43 kg/m.  General Appearance: Casual and Fairly Groomed  Eye Contact:  Good  Speech:  Clear and Coherent  Volume:  Normal  Mood:  Euthymic  Affect:  Congruent  Thought Process:  Goal Directed  Orientation:  Full (Time, Place, and Person)  Thought Content: WDL   Suicidal Thoughts:  No  Homicidal Thoughts:  No  Memory:  Immediate;   Good Recent;   Good Remote;   Fair  Judgement:  Fair  Insight:  Lacking  Psychomotor Activity:  Decreased  Concentration:  Concentration: Fair and Attention Span: Fair  Recall:  Good  Fund of Knowledge: Good  Language: Good  Akathisia:  No  Handed:  Right  AIMS (if indicated): not done  Assets:  Communication Skills Desire for Improvement Resilience Social Support Talents/Skills  ADL's:  Intact  Cognition: WNL  Sleep:  Fair   Screenings:   Assessment and Plan: This patient is a 77 year old female with a history of depression and anxiety.  She thinks she is doing fairly well.  She will continue Zoloft 50 mg daily for depression and gabapentin 100 mg 3 times daily for anxiety.  She will reduce Xanax to 0.5 mg only at bedtime for anxiety and sleep.  She will return to see me in 3 months   [name], MD 11/27/2017, 2:35 PM

## 2017-12-02 ENCOUNTER — Other Ambulatory Visit: Payer: Self-pay | Admitting: Orthopedic Surgery

## 2017-12-02 DIAGNOSIS — M1711 Unilateral primary osteoarthritis, right knee: Secondary | ICD-10-CM

## 2017-12-10 ENCOUNTER — Other Ambulatory Visit (HOSPITAL_COMMUNITY): Payer: Self-pay | Admitting: Psychiatry

## 2017-12-12 DIAGNOSIS — R109 Unspecified abdominal pain: Secondary | ICD-10-CM | POA: Diagnosis not present

## 2017-12-12 DIAGNOSIS — E876 Hypokalemia: Secondary | ICD-10-CM | POA: Diagnosis not present

## 2017-12-12 DIAGNOSIS — K5900 Constipation, unspecified: Secondary | ICD-10-CM | POA: Diagnosis not present

## 2017-12-12 DIAGNOSIS — R1084 Generalized abdominal pain: Secondary | ICD-10-CM | POA: Diagnosis not present

## 2017-12-12 DIAGNOSIS — I1310 Hypertensive heart and chronic kidney disease without heart failure, with stage 1 through stage 4 chronic kidney disease, or unspecified chronic kidney disease: Secondary | ICD-10-CM | POA: Diagnosis not present

## 2017-12-12 DIAGNOSIS — M25511 Pain in right shoulder: Secondary | ICD-10-CM | POA: Diagnosis not present

## 2017-12-12 DIAGNOSIS — Z6826 Body mass index (BMI) 26.0-26.9, adult: Secondary | ICD-10-CM | POA: Diagnosis not present

## 2017-12-12 DIAGNOSIS — R0602 Shortness of breath: Secondary | ICD-10-CM | POA: Diagnosis not present

## 2017-12-12 DIAGNOSIS — R110 Nausea: Secondary | ICD-10-CM | POA: Diagnosis not present

## 2017-12-25 ENCOUNTER — Other Ambulatory Visit: Payer: Self-pay | Admitting: Gastroenterology

## 2017-12-30 ENCOUNTER — Ambulatory Visit: Payer: Medicare HMO | Admitting: Orthopedic Surgery

## 2017-12-30 ENCOUNTER — Encounter: Payer: Self-pay | Admitting: Orthopedic Surgery

## 2017-12-30 VITALS — BP 130/75 | HR 74 | Ht 64.0 in | Wt 150.0 lb

## 2017-12-30 DIAGNOSIS — M1711 Unilateral primary osteoarthritis, right knee: Secondary | ICD-10-CM

## 2017-12-30 MED ORDER — MELOXICAM 15 MG PO TABS: 15.0000 mg | ORAL_TABLET | Freq: Every day | ORAL | 1 refills | Status: DC

## 2017-12-30 NOTE — Progress Notes (Signed)
Progress Note   Patient ID: Audrey Johnson, female   DOB: Nov 05, 1940, 77 y.o.   MRN: [id number]   Chief Complaint  Patient presents with  . Follow-up    Recheck on right knee.    77 year old female I recommended total knee replacement she does not want to have surgery she is comfortable taking Mobic and getting knee injections    Review of Systems  Neurological: Negative for tingling.     No Known Allergies   BP 130/75   Pulse 74   Ht 5\' 4"  (1.626 m)   Wt 150 lb (68 kg)   BMI 25.75 kg/m   Physical Exam  Constitutional: She is oriented to person, place, and time. She appears well-developed and well-nourished.  Musculoskeletal:       Right knee: She exhibits decreased range of motion, swelling, effusion and abnormal alignment. She exhibits no ecchymosis, no deformity, no laceration, no erythema, no LCL laxity and no MCL laxity. Tenderness found. Lateral joint line tenderness noted.  Neurological: She is alert and oriented to person, place, and time.  Psychiatric: She has a normal mood and affect. Judgment normal.  Vitals reviewed.    Medical decisions:   Data  Imaging:   No new imaging  Encounter Diagnosis  Name Primary?  Marland Kitchen Arthritis of knee, right Yes    Procedure note right knee injection verbal consent was obtained to inject right knee joint  Timeout was completed to confirm the site of injection  The medications used were 40 mg of Depo-Medrol and 1% lidocaine 3 cc  Anesthesia was provided by ethyl chloride and the skin was prepped with alcohol.  After cleaning the skin with alcohol a 20-gauge needle was used to inject the right knee joint. There were no complications. A sterile bandage was applied.   PLAN:   Patient is arthritis slightly worse than last time but she is comfortable getting a new injection And taking Mobic  Meds ordered this encounter  Medications  . meloxicam (MOBIC) 15 MG tablet    Sig: Take 1 tablet (15 mg total) by mouth  daily.    Dispense:  60 tablet    Refill:  1   Follow-up as needed    Arther Abbott, MD 12/30/2017 3:29 PM

## 2018-01-01 ENCOUNTER — Ambulatory Visit: Payer: Medicare HMO | Admitting: Orthopedic Surgery

## 2018-01-14 ENCOUNTER — Other Ambulatory Visit: Payer: Self-pay

## 2018-01-14 ENCOUNTER — Telehealth: Payer: Self-pay

## 2018-01-14 ENCOUNTER — Ambulatory Visit: Payer: Medicare HMO | Admitting: Internal Medicine

## 2018-01-14 ENCOUNTER — Encounter: Payer: Self-pay | Admitting: Internal Medicine

## 2018-01-14 VITALS — BP 128/76 | HR 78 | Temp 98.6°F | Ht 64.0 in | Wt 153.6 lb

## 2018-01-14 DIAGNOSIS — G8929 Other chronic pain: Secondary | ICD-10-CM | POA: Diagnosis not present

## 2018-01-14 DIAGNOSIS — R1031 Right lower quadrant pain: Secondary | ICD-10-CM | POA: Diagnosis not present

## 2018-01-14 DIAGNOSIS — R1011 Right upper quadrant pain: Secondary | ICD-10-CM

## 2018-01-14 NOTE — Telephone Encounter (Signed)
Referral sent via Epic to Dr. Constance Haw per Dr. Gala Romney (?gallbladder removal).

## 2018-01-14 NOTE — Patient Instructions (Signed)
No change in medications for now  I will speak to [facility].  It may well be that removing your gallbladder is the next DIAGNOSTIC TEST; no guarantees this surgery would alleviate all of your pain.  Further recommendations to follow

## 2018-01-14 NOTE — Progress Notes (Signed)
Primary Care Physician:  Celene Squibb, MD Primary Gastroenterologist:  Dr. Gala Romney  Pre-Procedure History & Physical: HPI:  Audrey Johnson is a 77 y.o. female here for follow-up of what is now a multiyear history of postprandial right sided abdominal pain -  Appears more like right mid / somewhat lower quadrant abdominal pain. Almost exclusively occurs postprandially -  comes on within 15 minutes of eating and subsides within about an hour. There is no associated change in bowel habits. No nausea or vomiting. Sometimes afraid to eat because of the anticipated symptoms which will follow.  She has gained 1 1/2 pounds since she was here earlier in the year. She has had an extensive evaluation well chronicled in the record including EGD/  colonoscopy CT/CTA abdomen pelvis, ultrasound and HIDA with CCK  without yielding a clear-cut cause for her symptoms.  She did have reproduction of her symptoms with the CCK administration.  Past Medical History:  Diagnosis Date  . Anxiety   . Arthritis    in back .   Marland Kitchen Borderline diabetes   . Chronic abdominal pain   . Chronic back pain   . Chronic constipation   . Chronic nausea   . Depression   . GERD (gastroesophageal reflux disease)   . High cholesterol   . Hypertension   . Noncompliance with medication regimen   . Pneumonia   . Poor historian   . Shortness of breath     even walking causes sob...     Past Surgical History:  Procedure Laterality Date  . ABDOMINAL HYSTERECTOMY    . BREAST SURGERY  unknown (many yrs)   excision  of lump right breast  was per pt "nothing"   . CARDIAC CATHETERIZATION    . CATARACT EXTRACTION W/PHACO Left 02/21/2015   Procedure: CATARACT EXTRACTION PHACO AND INTRAOCULAR LENS PLACEMENT LEFT EYE CDE=5.86;  Surgeon: Tonny Branch, MD;  Location: [facility];  Service: Ophthalmology;  Laterality: Left;  . CATARACT EXTRACTION W/PHACO Right 03/14/2015   Procedure: CATARACT EXTRACTION PHACO AND INTRAOCULAR LENS PLACEMENT  ([place]);  Surgeon: Tonny Branch, MD;  Location: [facility];  Service: Ophthalmology;  Laterality: Right;  CDE:6.36  . COLONOSCOPY  04/16/2007   RMR: Normal rectum and colon  . COLONOSCOPY WITH ESOPHAGOGASTRODUODENOSCOPY (EGD) N/A 01/08/2013   LOV:FIEP erosive reflux esophagitis. Negative H.pylori. Small hiatal hernia-s/p bx (gastritis)-TCS/Colonic polyp-hyperplastic  . DILATION AND CURETTAGE OF UTERUS    . ESOPHAGOGASTRODUODENOSCOPY (EGD) WITH PROPOFOL N/A 02/14/2017   DR. Lindalee Huizinga: normal esophagus, small hiatal hernia  . LEFT HEART CATHETERIZATION WITH CORONARY ANGIOGRAM N/A 06/05/2012   Procedure: LEFT HEART CATHETERIZATION WITH CORONARY ANGIOGRAM;  Surgeon: Hillary Bow, MD;  Location: [facility] CATH LAB;  Service: Cardiovascular;  Laterality: N/A;  . TOE SURGERY Right    5th toe, not sure what was wrong with it    Prior to Admission medications   Medication Sig Start Date End Date Taking? Authorizing Provider  ALPRAZolam Duanne Moron) 0.5 MG tablet Take 1 tablet (0.5 mg total) by mouth at bedtime. 11/27/17 11/27/18 Yes Cloria Spring, MD  BYSTOLIC 5 MG tablet Take 5 mg by mouth daily.  09/15/17  Yes [provider]  calcium carbonate (TUMS - DOSED IN MG ELEMENTAL CALCIUM) 500 MG chewable tablet Chew 1 tablet by mouth as needed for indigestion or heartburn.   Yes [provider]  diclofenac sodium (VOLTAREN) 1 % GEL Apply 1 application topically as needed. 12/12/17  Yes [provider]  gabapentin (NEURONTIN) 100  MG capsule Take 1 capsule (100 mg total) by mouth 3 (three) times daily. 11/27/17  Yes Cloria Spring, MD  lisinopril (PRINIVIL,ZESTRIL) 10 MG tablet Take 10 mg by mouth daily.   Yes [provider]  loratadine (CLARITIN) 10 MG tablet Take 10 mg by mouth daily. 11/13/17  Yes [provider]  megestrol (MEGACE) 40 MG/ML suspension Take 10 mLs by mouth daily. 10/14/17  Yes [provider]  meloxicam (MOBIC) 15 MG tablet Take 1 tablet (15 mg total) by mouth  daily. 12/30/17  Yes Carole Civil, MD  omeprazole (PRILOSEC) 20 MG capsule TAKE 1 CAPSULE BY MOUTH EVERY DAY 12/27/17  Yes Mahala Menghini, PA-C  sertraline (ZOLOFT) 50 MG tablet Take 1 tablet (50 mg total) by mouth daily. 11/27/17 11/27/18 Yes Cloria Spring, MD  predniSONE (DELTASONE) 10 MG tablet TAKE 1 TABLET BY MOUTH 2 TIMES DAILY WITH A MEAL. Patient not taking: Reported on 12/30/2017 12/03/17   Carole Civil, MD    Allergies as of 01/14/2018  . (No Known Allergies)    Family History  Problem Relation Age of Onset  . Colon cancer Neg Hx     Social History   Socioeconomic History  . Marital status: Widowed    Spouse name: Not on file  . Number of children: Not on file  . Years of education: Not on file  . Highest education level: Not on file  Occupational History  . Not on file  Social Needs  . Financial resource strain: Not on file  . Food insecurity:    Worry: Not on file    Inability: Not on file  . Transportation needs:    Medical: Not on file    Non-medical: Not on file  Tobacco Use  . Smoking status: Never Smoker  . Smokeless tobacco: Never Used  . Tobacco comment: Never smoked  Substance and Sexual Activity  . Alcohol use: No  . Drug use: No  . Sexual activity: Never    Birth control/protection: Surgical  Lifestyle  . Physical activity:    Days per week: Not on file    Minutes per session: Not on file  . Stress: Not on file  Relationships  . Social connections:    Talks on phone: Not on file    Gets together: Not on file    Attends religious service: Not on file    Active member of club or organization: Not on file    Attends meetings of clubs or organizations: Not on file    Relationship status: Not on file  . Intimate partner violence:    Fear of current or ex partner: Not on file    Emotionally abused: Not on file    Physically abused: Not on file    Forced sexual activity: Not on file  Other Topics Concern  . Not on file  Social  History Narrative  . Not on file    Review of Systems: See HPI, otherwise negative ROS  Physical Exam: BP 128/76   Pulse 78   Temp 98.6 F (37 C) (Oral)   Ht 5\' 4"  (1.626 m)   Wt 153 lb 9.6 oz (69.7 kg)   BMI 26.37 kg/m  General:   Alert,  Well-developed, well-nourished, pleasant and cooperative in NAD Neck:  Supple; no masses or thyromegaly. No significant cervical adenopathy. Lungs:  Clear throughout to auscultation.   No wheezes, crackles, or rhonchi. No acute distress. Heart:  Regular rate and rhythm; no murmurs,  clicks, rubs,  or gallops. Abdomen: Non-distended, normal bowel sounds. Soft with some right midabdominal tenderness to deep palpation. Her rib cage is somewhat low-lying bilaterally. No appreciable mass or organomegaly. Pulses:  Normal pulses noted. Extremities:  Without clubbing or edema.  Impression/Plan:  Very pleasant 77 year old lady frustrated with chronic postprandial right mid/lower quadrant abdominal pain. She has been extensively evaluated over time. She does not have any alarm symptoms such as GI bleeding or weight loss displayed.  Ultrasound and HIDA not at all compelling for objective evidence of underlying gallbladder disease. However, these imaging studies are not 100% sensitive for detecting symptomatic gallbladder disease.  Her symptoms of right-sided abdominal pain seem to be a little bit low to be emanating from her gallbladder.   I had a lengthy discussion with patient in the office today. It is highly unlikely that we have failed to diagnose a significant underlying disease process. It is unlikely she has an underlying malignancy.  Circling back around, it remains within the realm of possibility that her symptoms are emanating from her gallbladder.  The next diagnostic test would be a cholecystectomy. I did explain to her that we go this route there is absolutely no guarantee that her symptoms would improve or resolve with this maneuver.It would,  however, remove the gallbladder from future consideration should her symptoms persist.  At this time, I told her I'd reach out to Dr. Constance Haw and ask her to see her once again regarding her abdominal pain.  Further recommendations to follow.   I have discussed the case with Dr. Constance Haw via telephone. She has kindly agreed to see the patient once again.       Notice: This dictation was prepared with Dragon dictation along with smaller phrase technology. Any transcriptional errors that result from this process are unintentional and may not be corrected upon review.

## 2018-01-21 ENCOUNTER — Ambulatory Visit: Payer: Medicare HMO | Admitting: General Surgery

## 2018-01-21 ENCOUNTER — Encounter: Payer: Self-pay | Admitting: General Surgery

## 2018-01-21 VITALS — BP 123/73 | HR 78 | Temp 97.7°F | Resp 18 | Wt 153.0 lb

## 2018-01-21 DIAGNOSIS — K828 Other specified diseases of gallbladder: Secondary | ICD-10-CM

## 2018-01-21 NOTE — Patient Instructions (Signed)
Laparoscopic Cholecystectomy Laparoscopic cholecystectomy is surgery to remove the gallbladder. The gallbladder is a pear-shaped organ that lies beneath the liver on the right side of the body. The gallbladder stores bile, which is a fluid that helps the body to digest fats. Cholecystectomy is often done for inflammation of the gallbladder (cholecystitis). This condition is usually caused by a buildup of gallstones (cholelithiasis) in the gallbladder. Gallstones can block the flow of bile, which can result in inflammation and pain. In severe cases, emergency surgery may be required. This procedure is done though small incisions in your abdomen (laparoscopic surgery). A thin scope with a camera (laparoscope) is inserted through one incision. Thin surgical instruments are inserted through the other incisions. In some cases, a laparoscopic procedure may be turned into a type of surgery that is done through a larger incision (open surgery). Tell a health care provider about:  Any allergies you have.  All medicines you are taking, including vitamins, herbs, eye drops, creams, and over-the-counter medicines.  Any problems you or family members have had with anesthetic medicines.  Any blood disorders you have.  Any surgeries you have had.  Any medical conditions you have.  Whether you are pregnant or may be pregnant. What are the risks? Generally, this is a safe procedure. However, problems may occur, including:  Infection.  Bleeding.  Allergic reactions to medicines.  Damage to other structures or organs.  A stone remaining in the common bile duct. The common bile duct carries bile from the gallbladder into the small intestine.  A bile leak from the cyst duct that is clipped when your gallbladder is removed.  at contain milk.   Medicines  Ask your health care provider about: ? Changing or stopping your regular medicines. This is especially important if you are taking diabetes  medicines or blood thinners. ? Taking medicines such as aspirin and ibuprofen. These medicines can thin your blood. Do not take these medicines before your procedure if your health care provider instructs you not to.  You may be given antibiotic medicine to help prevent infection. General instructions  Let your health care provider know if you develop a cold or an infection before surgery.  Plan to have someone take you home from the hospital or clinic.  Ask your health care provider how your surgical site will be marked or identified. What happens during the procedure?  To reduce your risk of infection: ? Your health care team will wash or sanitize their hands. ? Your skin will be washed with soap. ? Hair may be removed from the surgical area.  An IV tube may be inserted into one of your veins.  You will be given one or more of the following: ? A medicine to help you relax (sedative). ? A medicine to make you fall asleep (general anesthetic).  A breathing tube will be placed in your mouth.  Your surgeon will make several small cuts (incisions) in your abdomen.  The laparoscope will be inserted through one of the small incisions. The camera on the laparoscope will send images to a TV screen (monitor) in the operating room. This lets your surgeon see inside your abdomen.  Air-like gas will be pumped into your abdomen. This will expand your abdomen to give the surgeon more room to perform the surgery.  Other tools that are needed for the procedure will be inserted through the other incisions. The gallbladder will be removed through one of the incisions.  Your common bile duct may be  examined. If stones are found in the common bile duct, they may be removed.  After your gallbladder has been removed, the incisions will be closed with stitches (sutures), staples, or skin glue.  Your incisions may be covered with a bandage (dressing). The procedure may vary among health care  providers and hospitals. What happens after the procedure?  Your blood pressure, heart rate, breathing rate, and blood oxygen level will be monitored until the medicines you were given have worn off.  You will be given medicines as needed to control your pain.  Do not drive for 24 hours if you were given a sedative. This information is not intended to replace advice given to you by your health care provider. Make sure you discuss any questions you have with your health care provider. Document Released: 06/18/2005 Document Revised: 01/08/2016 Document Reviewed: 12/05/2015 Elsevier Interactive Patient Education  2018 [employer].

## 2018-01-21 NOTE — H&P (Signed)
[facility] History and Physical  Reason for Referral: Chronic RUQ/ Abdominal pain, Concern for gallbladder  Referring Physician:  Dr. Doralee Johnson is a 77 y.o. female.  HPI: [name] is a 89 female who saw me a few months ago with complaints of chronic abdominal pain that was inconsistent with gallbladder pain, mentioning pain at night with urination and pain in her lower abdomen.  She continues to have abdominal pain and has been seen in the ED and by Dr. Gala Johnson. She continues to have abdominal pain and says now that it is in the RUQ/ epigastric pain and pain also generalized in there lower abdomen and in her umbilicus area.  She says that the pain use to be associated with some food years ago, but that now it is just constantly there. She told Dr. Gala Johnson a different story with it being after eating, so I am not sure which is the correct history at this point.  She has been evaluated in the past with an EGD 01/2017 that was normal except for a small hiatal hernia, and a CTA that was negative for any mesenteric stenosis.    She says that she feels bloated and nauseated all the time, and she occasionally has some diarrhea. She says that she has lost weight over the last 3-4 years of dealing with this pain that has been chronic and is not improving and getting worse.       Past Medical History:  Diagnosis Date  . Anxiety   . Arthritis    in back .   Marland Kitchen Borderline diabetes   . Chronic abdominal pain   . Chronic back pain   . Chronic constipation   . Chronic nausea   . Depression   . GERD (gastroesophageal reflux disease)   . High cholesterol   . Hypertension   . Noncompliance with medication regimen   . Pneumonia   . Poor historian   . Shortness of breath     even walking causes sob...          Past Surgical History:  Procedure Laterality Date  . ABDOMINAL HYSTERECTOMY    . BREAST SURGERY  unknown (many yrs)   excision   of lump right breast  was per pt "nothing"   . CARDIAC CATHETERIZATION    . CATARACT EXTRACTION W/PHACO Left 02/21/2015   Procedure: CATARACT EXTRACTION PHACO AND INTRAOCULAR LENS PLACEMENT LEFT EYE CDE=5.86;  Surgeon: Audrey Branch, MD;  Location: [facility];  Service: Ophthalmology;  Laterality: Left;  . CATARACT EXTRACTION W/PHACO Right 03/14/2015   Procedure: CATARACT EXTRACTION PHACO AND INTRAOCULAR LENS PLACEMENT ([place]);  Surgeon: Audrey Branch, MD;  Location: [facility];  Service: Ophthalmology;  Laterality: Right;  CDE:6.36  . COLONOSCOPY  04/16/2007   RMR: Normal rectum and colon  . COLONOSCOPY WITH ESOPHAGOGASTRODUODENOSCOPY (EGD) N/A 01/08/2013   [id number]:[id number] erosive reflux esophagitis. Negative H.pylori. Small hiatal hernia-s/p bx (gastritis)-TCS/Colonic polyp-hyperplastic  . DILATION AND CURETTAGE OF UTERUS    . ESOPHAGOGASTRODUODENOSCOPY (EGD) WITH PROPOFOL N/A 02/14/2017   DR. Rourk: normal esophagus, small hiatal hernia  . LEFT HEART CATHETERIZATION WITH CORONARY ANGIOGRAM N/A 06/05/2012   Procedure: LEFT HEART CATHETERIZATION WITH CORONARY ANGIOGRAM;  Surgeon: Audrey Bow, MD;  Location: [facility] C[facility] LAB;  Service: Cardiovascular;  Laterality: N/A;  . TOE SURGERY Right    5th toe, not sure what was wrong with it         Family History  Problem Relation Age of Onset  .  Colon cancer Neg Hx     Social History       Tobacco Use  . Smoking status: Never Smoker  . Smokeless tobacco: Never Used  . Tobacco comment: Never smoked  Substance Use Topics  . Alcohol use: No  . Drug use: No    Medications: I have reviewed the patient's current medications. Allergies as of 01/21/2018   No Known Allergies              Medication List            Accurate as of 01/21/18 12:33 PM. Always use your most recent med list.           ALPRAZolam 0.5 MG tablet Commonly known as:  XANAX Take 1 tablet (0.5 mg total) by mouth at bedtime.   BYSTOLIC 5 MG tablet Generic  drug:  nebivolol Take 5 mg by mouth daily.   calcium carbonate 500 MG chewable tablet Commonly known as:  TUMS - dosed in mg elemental calcium Chew 1 tablet by mouth as needed for indigestion or heartburn.   diclofenac sodium 1 % Gel Commonly known as:  VOLTAREN Apply 1 application topically as needed.   gabapentin 100 MG capsule Commonly known as:  NEURONTIN Take 1 capsule (100 mg total) by mouth 3 (three) times daily.   lisinopril 10 MG tablet Commonly known as:  PRINIVIL,ZESTRIL Take 10 mg by mouth daily.   loratadine 10 MG tablet Commonly known as:  CLARITIN Take 10 mg by mouth daily.   megestrol 40 MG/ML suspension Commonly known as:  MEGACE Take 10 mLs by mouth daily.   meloxicam 15 MG tablet Commonly known as:  MOBIC Take 1 tablet (15 mg total) by mouth daily.   omeprazole 20 MG capsule Commonly known as:  PRILOSEC TAKE 1 CAPSULE BY MOUTH EVERY DAY   sertraline 50 MG tablet Commonly known as:  ZOLOFT Take 1 tablet (50 mg total) by mouth daily.        ROS:  A comprehensive review of systems was negative except for: Constitutional: positive for chills Eyes: positive for blurred vision Gastrointestinal: positive for abdominal pain, nausea, reflux symptoms and vomiting Musculoskeletal: positive for back pain and stiff joints Endocrine: positive for tired sluggish  Blood pressure 123/73, pulse 78, temperature 97.7 F (36.5 C), temperature source Temporal, resp. rate 18, weight 153 lb (69.4 kg). Physical Exam  Constitutional: She is oriented to person, place, and time. She appears well-developed.  HENT:  Head: Normocephalic.  Eyes: Pupils are equal, round, and reactive to light. EOM are normal.  Neck: Normal range of motion.  Cardiovascular: Normal rate.  Pulmonary/Chest: Effort normal and breath sounds normal. No respiratory distress.  Abdominal: Soft. She exhibits no distension and no mass. There is tenderness in the right upper quadrant.  There is no rebound.  Musculoskeletal: Normal range of motion. She exhibits no edema.  Neurological: She is alert and oriented to person, place, and time.  Skin: Skin is warm and dry.  Psychiatric: She has a normal mood and affect. Her behavior is normal. Judgment and thought content normal.  Vitals reviewed.   Results: RUQ [place] 01/2017 EXAM: ULTRASOUND ABDOMEN LIMITED RIGHT UPPER QUADRANT  COMPARISON: None.  FINDINGS: Gallbladder:  No gallstones or wall thickening visualized. No sonographic Murphy sign noted by sonographer.  Common bile duct:  Diameter: 3.0 mm  Liver:  No focal lesion identified. Within normal limits in parenchymal echogenicity. Portal vein is patent on color Doppler imaging with normal direction of  blood flow towards the liver.  IMPRESSION: No acute or focal significant abnormality identified no gallstones or biliary distention.  HIDA 03/2017 CLINICAL DATA: 77 year old female with right upper quadrant pain for 5 months. Subsequent encounter.  EXAM: NUCLEAR MEDICINE HEPATOBILIARY IMAGING WITH GALLBLADDER EF  TECHNIQUE: Sequential images of the abdomen were obtained out to 60 minutes following intravenous administration of radiopharmaceutical. After oral ingestion of Ensure, gallbladder ejection fraction was determined. At 60 min, normal ejection fraction is greater than 33%.  RADIOPHARMACEUTICALS: 5.3 mCi Tc-64m Choletec IV  COMPARISON: 02/22/2017 ultrasound. 02/11/2017 CT.  FINDINGS: Prompt uptake and biliary excretion of activity by the liver is seen. Gallbladder activity is visualized, consistent with patency of cystic duct. Biliary activity passes into small bowel, consistent with patent common bile duct.  Calculated gallbladder ejection fraction is 87%. (Normal gallbladder ejection fraction with Ensure is greater than 33%.)  Patient experienced abdominal pain during examination.  IMPRESSION: Gallbladder  ejection fraction of 87%. (Normal gallbladder ejection fraction with Ensure is greater than 33%.)  Patient experienced abdominal pain during examination.  CT a/p 3/19 EXAM: CT ABDOMEN AND PELVIS WITH CONTRAST  TECHNIQUE: Multidetector CT imaging of the abdomen and pelvis was performed using the standard protocol following bolus administration of intravenous contrast.  CONTRAST: 154mL ISOVUE-300 IOPAMIDOL (ISOVUE-300) INJECTION 61%  COMPARISON: CT the abdomen and pelvis 02/11/2017.  FINDINGS: Lower chest: Unremarkable.  Hepatobiliary: No suspicious cystic or solid hepatic lesions. No intra or extrahepatic biliary ductal dilatation. Gallbladder is normal in appearance.  Pancreas: No pancreatic mass. No pancreatic ductal dilatation. No pancreatic or peripancreatic fluid or inflammatory changes.  Spleen: Unremarkable.  Adrenals/Urinary Tract: 1.4 cm low-attenuation lesion in the upper pole of the right kidney is compatible with a simple cyst. Left kidney is normal in appearance. Bilateral adrenal glands are normal in appearance. No hydroureteronephrosis. Urinary bladder is normal in appearance.  Stomach/Bowel: Normal appearance of the stomach. No pathologic dilatation of small bowel or colon. Normal appendix.  Vascular/Lymphatic: Aortic atherosclerosis, without evidence of aneurysm or dissection in the abdominal or pelvic vasculature. No lymphadenopathy noted in the abdomen or pelvis.  Reproductive: Status posthysterectomy. Ovaries are not confidently identified may be surgically absent or atrophic.  Other: No significant volume of ascites. No pneumoperitoneum.  Musculoskeletal: There are no aggressive appearing lytic or blastic lesions noted in the visualized portions of the skeleton.  IMPRESSION: 1. No acute findings are noted in the abdomen or pelvis to account for the patient's sym ptoms. 2. Aortic atherosclerosis. 3. Additional incidental  findings, as above.  Assessment & Plan:  Audrey Johnson is a 77 y.o. female with possible biliary dyskinesia/ possibly chronic cholecystitis who has been through the complete workup and is still having pain.  Her gallbladder does look a little distended on the CT scan, but otherwise her studies have been normal.  She has vague/ inconsistent pain but does at least have some RUQ tenderness and pain complaints with associated nausea and bloating.  We have discussed extensively that this might not be her gallbladder given the studies, but that we can remove the gallbladder and see if her symptoms improve.  She have discussed that she might not have relief of any of her symptoms and that there are real risks with surgery.   PLAN: I counseled the patient about the indication, risks and benefits of laparoscopic cholecystectomy.  She understands there is a very small chance for bleeding, infection, injury to normal structures (including common bile duct), conversion to open surgery, persistent symptoms, evolution of  postcholecystectomy diarrhea, need for secondary interventions, anesthesia reaction, cardiopulmonary issues and other risks not specifically detailed here. I described the expected recovery, the plan for follow-up and the restrictions during the recovery phase.  All questions were answered. She opts to proceed with surgery given the possibility of any improvement as she has suffered for years without relief.    Virl Cagey 01/21/2018, 12:33 PM

## 2018-01-21 NOTE — Progress Notes (Signed)
[facility] History and Physical  Reason for Referral: Chronic RUQ/ Abdominal pain, Concern for gallbladder  Referring Physician:  Dr. Doralee Albino is a 77 y.o. female.  HPI: [name] is a 15 female who saw me a few months ago with complaints of chronic abdominal pain that was inconsistent with gallbladder pain, mentioning pain at night with urination and pain in her lower abdomen.  She continues to have abdominal pain and has been seen in the ED and by Dr. Gala Romney. She continues to have abdominal pain and says now that it is in the RUQ/ epigastric pain and pain also generalized in there lower abdomen and in her umbilicus area.  She says that the pain use to be associated with some food years ago, but that now it is just constantly there. She told Dr. Gala Romney a different story with it being after eating, so I am not sure which is the correct history at this point.  She has been evaluated in the past with an EGD 01/2017 that was normal except for a small hiatal hernia, and a CTA that was negative for any mesenteric stenosis.    She says that she feels bloated and nauseated all the time, and she occasionally has some diarrhea. She says that she has lost weight over the last 3-4 years of dealing with this pain that has been chronic and is not improving and getting worse.   Past Medical History:  Diagnosis Date  . Anxiety   . Arthritis    in back .   Marland Kitchen Borderline diabetes   . Chronic abdominal pain   . Chronic back pain   . Chronic constipation   . Chronic nausea   . Depression   . GERD (gastroesophageal reflux disease)   . High cholesterol   . Hypertension   . Noncompliance with medication regimen   . Pneumonia   . Poor historian   . Shortness of breath     even walking causes sob...     Past Surgical History:  Procedure Laterality Date  . ABDOMINAL HYSTERECTOMY    . BREAST SURGERY  unknown (many yrs)   excision  of lump right breast  was per pt  "nothing"   . CARDIAC CATHETERIZATION    . CATARACT EXTRACTION W/PHACO Left 02/21/2015   Procedure: CATARACT EXTRACTION PHACO AND INTRAOCULAR LENS PLACEMENT LEFT EYE CDE=5.86;  Surgeon: Tonny Branch, MD;  Location: [facility];  Service: Ophthalmology;  Laterality: Left;  . CATARACT EXTRACTION W/PHACO Right 03/14/2015   Procedure: CATARACT EXTRACTION PHACO AND INTRAOCULAR LENS PLACEMENT ([place]);  Surgeon: Tonny Branch, MD;  Location: [facility];  Service: Ophthalmology;  Laterality: Right;  CDE:6.36  . COLONOSCOPY  04/16/2007   RMR: Normal rectum and colon  . COLONOSCOPY WITH ESOPHAGOGASTRODUODENOSCOPY (EGD) N/A 01/08/2013   ZMO:QHUT erosive reflux esophagitis. Negative H.pylori. Small hiatal hernia-s/p bx (gastritis)-TCS/Colonic polyp-hyperplastic  . DILATION AND CURETTAGE OF UTERUS    . ESOPHAGOGASTRODUODENOSCOPY (EGD) WITH PROPOFOL N/A 02/14/2017   DR. Rourk: normal esophagus, small hiatal hernia  . LEFT HEART CATHETERIZATION WITH CORONARY ANGIOGRAM N/A 06/05/2012   Procedure: LEFT HEART CATHETERIZATION WITH CORONARY ANGIOGRAM;  Surgeon: Hillary Bow, MD;  Location: [facility] CATH LAB;  Service: Cardiovascular;  Laterality: N/A;  . TOE SURGERY Right    5th toe, not sure what was wrong with it    Family History  Problem Relation Age of Onset  . Colon cancer Neg Hx     Social History  Tobacco Use  . Smoking status: Never Smoker  . Smokeless tobacco: Never Used  . Tobacco comment: Never smoked  Substance Use Topics  . Alcohol use: No  . Drug use: No    Medications: I have reviewed the patient's current medications. Allergies as of 01/21/2018   No Known Allergies     Medication List        Accurate as of 01/21/18 12:33 PM. Always use your most recent med list.          ALPRAZolam 0.5 MG tablet Commonly known as:  XANAX Take 1 tablet (0.5 mg total) by mouth at bedtime.   BYSTOLIC 5 MG tablet Generic drug:  nebivolol Take 5 mg by mouth daily.   calcium carbonate 500 MG chewable  tablet Commonly known as:  TUMS - dosed in mg elemental calcium Chew 1 tablet by mouth as needed for indigestion or heartburn.   diclofenac sodium 1 % Gel Commonly known as:  VOLTAREN Apply 1 application topically as needed.   gabapentin 100 MG capsule Commonly known as:  NEURONTIN Take 1 capsule (100 mg total) by mouth 3 (three) times daily.   lisinopril 10 MG tablet Commonly known as:  PRINIVIL,ZESTRIL Take 10 mg by mouth daily.   loratadine 10 MG tablet Commonly known as:  CLARITIN Take 10 mg by mouth daily.   megestrol 40 MG/ML suspension Commonly known as:  MEGACE Take 10 mLs by mouth daily.   meloxicam 15 MG tablet Commonly known as:  MOBIC Take 1 tablet (15 mg total) by mouth daily.   omeprazole 20 MG capsule Commonly known as:  PRILOSEC TAKE 1 CAPSULE BY MOUTH EVERY DAY   sertraline 50 MG tablet Commonly known as:  ZOLOFT Take 1 tablet (50 mg total) by mouth daily.        ROS:  A comprehensive review of systems was negative except for: Constitutional: positive for chills Eyes: positive for blurred vision Gastrointestinal: positive for abdominal pain, nausea, reflux symptoms and vomiting Musculoskeletal: positive for back pain and stiff joints Endocrine: positive for tired sluggish  Blood pressure 123/73, pulse 78, temperature 97.7 F (36.5 C), temperature source Temporal, resp. rate 18, weight 153 lb (69.4 kg). Physical Exam  Constitutional: She is oriented to person, place, and time. She appears well-developed.  HENT:  Head: Normocephalic.  Eyes: Pupils are equal, round, and reactive to light. EOM are normal.  Neck: Normal range of motion.  Cardiovascular: Normal rate.  Pulmonary/Chest: Effort normal and breath sounds normal. No respiratory distress.  Abdominal: Soft. She exhibits no distension and no mass. There is tenderness in the right upper quadrant. There is no rebound.  Musculoskeletal: Normal range of motion. She exhibits no edema.   Neurological: She is alert and oriented to person, place, and time.  Skin: Skin is warm and dry.  Psychiatric: She has a normal mood and affect. Her behavior is normal. Judgment and thought content normal.  Vitals reviewed.   Results: RUQ [place] 01/2017 EXAM: ULTRASOUND ABDOMEN LIMITED RIGHT UPPER QUADRANT  COMPARISON:  None.  FINDINGS: Gallbladder:  No gallstones or wall thickening visualized. No sonographic Murphy sign noted by sonographer.  Common bile duct:  Diameter: 3.0 mm  Liver:  No focal lesion identified. Within normal limits in parenchymal echogenicity. Portal vein is patent on color Doppler imaging with normal direction of blood flow towards the liver.  IMPRESSION: No acute or focal significant abnormality identified no gallstones or biliary distention.  HIDA 03/2017 CLINICAL DATA:  77 year old female with right  upper quadrant pain for 5 months. Subsequent encounter.  EXAM: NUCLEAR MEDICINE HEPATOBILIARY IMAGING WITH GALLBLADDER EF  TECHNIQUE: Sequential images of the abdomen were obtained out to 60 minutes following intravenous administration of radiopharmaceutical. After oral ingestion of Ensure, gallbladder ejection fraction was determined. At 60 min, normal ejection fraction is greater than 33%.  RADIOPHARMACEUTICALS:  5.3 mCi Tc-38m  Choletec IV  COMPARISON:  02/22/2017 ultrasound.  02/11/2017 CT.  FINDINGS: Prompt uptake and biliary excretion of activity by the liver is seen. Gallbladder activity is visualized, consistent with patency of cystic duct. Biliary activity passes into small bowel, consistent with patent common bile duct.  Calculated gallbladder ejection fraction is 87%. (Normal gallbladder ejection fraction with Ensure is greater than 33%.)  Patient experienced abdominal pain during examination.  IMPRESSION: Gallbladder ejection fraction of 87%. (Normal gallbladder ejection fraction with Ensure is greater than  33%.)  Patient experienced abdominal pain during examination.  CT a/p 3/19 EXAM: CT ABDOMEN AND PELVIS WITH CONTRAST  TECHNIQUE: Multidetector CT imaging of the abdomen and pelvis was performed using the standard protocol following bolus administration of intravenous contrast.  CONTRAST:  151mL ISOVUE-300 IOPAMIDOL (ISOVUE-300) INJECTION 61%  COMPARISON:  CT the abdomen and pelvis 02/11/2017.  FINDINGS: Lower chest: Unremarkable.  Hepatobiliary: No suspicious cystic or solid hepatic lesions. No intra or extrahepatic biliary ductal dilatation. Gallbladder is normal in appearance.  Pancreas: No pancreatic mass. No pancreatic ductal dilatation. No pancreatic or peripancreatic fluid or inflammatory changes.  Spleen: Unremarkable.  Adrenals/Urinary Tract: 1.4 cm low-attenuation lesion in the upper pole of the right kidney is compatible with a simple cyst. Left kidney is normal in appearance. Bilateral adrenal glands are normal in appearance. No hydroureteronephrosis. Urinary bladder is normal in appearance.  Stomach/Bowel: Normal appearance of the stomach. No pathologic dilatation of small bowel or colon. Normal appendix.  Vascular/Lymphatic: Aortic atherosclerosis, without evidence of aneurysm or dissection in the abdominal or pelvic vasculature. No lymphadenopathy noted in the abdomen or pelvis.  Reproductive: Status posthysterectomy. Ovaries are not confidently identified may be surgically absent or atrophic.  Other: No significant volume of ascites.  No pneumoperitoneum.  Musculoskeletal: There are no aggressive appearing lytic or blastic lesions noted in the visualized portions of the skeleton.  IMPRESSION: 1. No acute findings are noted in the abdomen or pelvis to account for the patient's sym ptoms. 2. Aortic atherosclerosis. 3. Additional incidental findings, as above.  Assessment & Plan:  GEETIKA LABORDE is a 77 y.o. female with  possible biliary dyskinesia/ possibly chronic cholecystitis who has been through the complete workup and is still having pain.  Her gallbladder does look a little distended on the CT scan, but otherwise her studies have been normal.  She has vague/ inconsistent pain but does at least have some RUQ tenderness and pain complaints with associated nausea and bloating.  We have discussed extensively that this might not be her gallbladder given the studies, but that we can remove the gallbladder and see if her symptoms improve.  She have discussed that she might not have relief of any of her symptoms and that there are real risks with surgery.   PLAN: I counseled the patient about the indication, risks and benefits of laparoscopic cholecystectomy.  She understands there is a very small chance for bleeding, infection, injury to normal structures (including common bile duct), conversion to open surgery, persistent symptoms, evolution of postcholecystectomy diarrhea, need for secondary interventions, anesthesia reaction, cardiopulmonary issues and other risks not specifically detailed here. I described the expected  recovery, the plan for follow-up and the restrictions during the recovery phase.  All questions were answered. She opts to proceed with surgery given the possibility of any improvement as she has suffered for years without relief.    Virl Cagey 01/21/2018, 12:33 PM

## 2018-01-22 NOTE — Patient Instructions (Signed)
Audrey Johnson  01/22/2018     @PREFPERIOPPHARMACY @   Your procedure is scheduled on  01/24/2018.  Report to [facility] at  1100   A.M.  Call this number if you have problems the morning of surgery:  [phone]   Remember:  Do not eat or drink after midnight.  You may drink clear liquids until  12 midnight 01/23/2018 .  Clear liquids allowed are:                    Water, Juice (non-citric and without pulp), Carbonated beverages, Clear Tea, Black Coffee only, Plain Jell-O only, Gatorade and Plain Popsicles only    Take these medicines the morning of surgery with A SIP OF WATER  Bystoloc, neurontin, lisinopril, claritin, mobic, prilosec, zoloft.    Do not wear jewelry, make-up or nail polish.  Do not wear lotions, powders, or perfumes, or deodorant.  Do not shave 48 hours prior to surgery.  Men may shave face and neck.  Do not bring valuables to the hospital.  [facility] is not responsible for any belongings or valuables.  Contacts, dentures or bridgework may not be worn into surgery.  Leave your suitcase in the car.  After surgery it may be brought to your room.  For patients admitted to the hospital, discharge time will be determined by your treatment team.  Patients discharged the day of surgery will not be allowed to drive home.   Name and phone number of your driver:   family Special instructions:  None  Please read over the following fact sheets that you were given. Anesthesia Post-op Instructions and Care and Recovery After Surgery       Laparoscopic Cholecystectomy Laparoscopic cholecystectomy is surgery to remove the gallbladder. The gallbladder is a pear-shaped organ that lies beneath the liver on the right side of the body. The gallbladder stores bile, which is a fluid that helps the body to digest fats. Cholecystectomy is often done for inflammation of the gallbladder (cholecystitis). This condition is usually caused by a buildup of  gallstones (cholelithiasis) in the gallbladder. Gallstones can block the flow of bile, which can result in inflammation and pain. In severe cases, emergency surgery may be required. This procedure is done though small incisions in your abdomen (laparoscopic surgery). A thin scope with a camera (laparoscope) is inserted through one incision. Thin surgical instruments are inserted through the other incisions. In some cases, a laparoscopic procedure may be turned into a type of surgery that is done through a larger incision (open surgery). Tell a health care provider about:  Any allergies you have.  All medicines you are taking, including vitamins, herbs, eye drops, creams, and over-the-counter medicines.  Any problems you or family members have had with anesthetic medicines.  Any blood disorders you have.  Any surgeries you have had.  Any medical conditions you have.  Whether you are pregnant or may be pregnant. What are the risks? Generally, this is a safe procedure. However, problems may occur, including:  Infection.  Bleeding.  Allergic reactions to medicines.  Damage to other structures or organs.  A stone remaining in the common bile duct. The common bile duct carries bile from the gallbladder into the small intestine.  A bile leak from the cyst duct that is clipped when your gallbladder is removed.  What happens before the procedure? Staying hydrated Follow instructions from your health care provider about hydration, which  may include:  Up to 2 hours before the procedure - you may continue to drink clear liquids, such as water, clear fruit juice, black coffee, and plain tea.  Eating and drinking restrictions Follow instructions from your health care provider about eating and drinking, which may include:  8 hours before the procedure - stop eating heavy meals or foods such as meat, fried foods, or fatty foods.  6 hours before the procedure - stop eating light meals or  foods, such as toast or cereal.  6 hours before the procedure - stop drinking milk or drinks that contain milk.  2 hours before the procedure - stop drinking clear liquids.  Medicines  Ask your health care provider about: ? Changing or stopping your regular medicines. This is especially important if you are taking diabetes medicines or blood thinners. ? Taking medicines such as aspirin and ibuprofen. These medicines can thin your blood. Do not take these medicines before your procedure if your health care provider instructs you not to.  You may be given antibiotic medicine to help prevent infection. General instructions  Let your health care provider know if you develop a cold or an infection before surgery.  Plan to have someone take you home from the hospital or clinic.  Ask your health care provider how your surgical site will be marked or identified. What happens during the procedure?  To reduce your risk of infection: ? Your health care team will wash or sanitize their hands. ? Your skin will be washed with soap. ? Hair may be removed from the surgical area.  An IV tube may be inserted into one of your veins.  You will be given one or more of the following: ? A medicine to help you relax (sedative). ? A medicine to make you fall asleep (general anesthetic).  A breathing tube will be placed in your mouth.  Your surgeon will make several small cuts (incisions) in your abdomen.  The laparoscope will be inserted through one of the small incisions. The camera on the laparoscope will send images to a TV screen (monitor) in the operating room. This lets your surgeon see inside your abdomen.  Air-like gas will be pumped into your abdomen. This will expand your abdomen to give the surgeon more room to perform the surgery.  Other tools that are needed for the procedure will be inserted through the other incisions. The gallbladder will be removed through one of the  incisions.  Your common bile duct may be examined. If stones are found in the common bile duct, they may be removed.  After your gallbladder has been removed, the incisions will be closed with stitches (sutures), staples, or skin glue.  Your incisions may be covered with a bandage (dressing). The procedure may vary among health care providers and hospitals. What happens after the procedure?  Your blood pressure, heart rate, breathing rate, and blood oxygen level will be monitored until the medicines you were given have worn off.  You will be given medicines as needed to control your pain.  Do not drive for 24 hours if you were given a sedative. This information is not intended to replace advice given to you by your health care provider. Make sure you discuss any questions you have with your health care provider. Document Released: 06/18/2005 Document Revised: 01/08/2016 Document Reviewed: 12/05/2015 Elsevier Interactive Patient Education  2018 [employer].  Laparoscopic Cholecystectomy, Care After This sheet gives you information about how to care for yourself after  your procedure. Your health care provider may also give you more specific instructions. If you have problems or questions, contact your health care provider. What can I expect after the procedure? After the procedure, it is common to have:  Pain at your incision sites. You will be given medicines to control this pain.  Mild nausea or vomiting.  Bloating and possible shoulder pain from the air-like gas that was used during the procedure.  Follow these instructions at home: Incision care   Follow instructions from your health care provider about how to take care of your incisions. Make sure you: ? Wash your hands with soap and water before you change your bandage (dressing). If soap and water are not available, use hand sanitizer. ? Change your dressing as told by your health care provider. ? Leave stitches (sutures),  skin glue, or adhesive strips in place. These skin closures may need to be in place for 2 weeks or longer. If adhesive strip edges start to loosen and curl up, you may trim the loose edges. Do not remove adhesive strips completely unless your health care provider tells you to do that.  Do not take baths, swim, or use a hot tub until your health care provider approves. Ask your health care provider if you can take showers. You may only be allowed to take sponge baths for bathing.  Check your incision area every day for signs of infection. Check for: ? More redness, swelling, or pain. ? More fluid or blood. ? Warmth. ? Pus or a bad smell. Activity  Do not drive or use heavy machinery while taking prescription pain medicine.  Do not lift anything that is heavier than 10 lb (4.5 kg) until your health care provider approves.  Do not play contact sports until your health care provider approves.  Do not drive for 24 hours if you were given a medicine to help you relax (sedative).  Rest as needed. Do not return to work or school until your health care provider approves. General instructions  Take over-the-counter and prescription medicines only as told by your health care provider.  To prevent or treat constipation while you are taking prescription pain medicine, your health care provider may recommend that you: ? Drink enough fluid to keep your urine clear or pale yellow. ? Take over-the-counter or prescription medicines. ? Eat foods that are high in fiber, such as fresh fruits and vegetables, whole grains, and beans. ? Limit foods that are high in fat and processed sugars, such as fried and sweet foods. Contact a health care provider if:  You develop a rash.  You have more redness, swelling, or pain around your incisions.  You have more fluid or blood coming from your incisions.  Your incisions feel warm to the touch.  You have pus or a bad smell coming from your incisions.  You  have a fever.  One or more of your incisions breaks open. Get help right away if:  You have trouble breathing.  You have chest pain.  You have increasing pain in your shoulders.  You faint or feel dizzy when you stand.  You have severe pain in your abdomen.  You have nausea or vomiting that lasts for more than one day.  You have leg pain. This information is not intended to replace advice given to you by your health care provider. Make sure you discuss any questions you have with your health care provider. Document Released: 06/18/2005 Document Revised: 01/07/2016 Document Reviewed: 12/05/2015  Chartered certified [employer] Patient Education  2018 [place] Anesthesia, Adult General anesthesia is the use of medicines to make a person "go to sleep" (be unconscious) for a medical procedure. General anesthesia is often recommended when a procedure:  Is long.  Requires you to be still or in an unusual position.  Is major and can cause you to lose blood.  Is impossible to do without general anesthesia.  The medicines used for general anesthesia are called general anesthetics. In addition to making you sleep, the medicines:  Prevent pain.  Control your blood pressure.  Relax your muscles.  Tell a health care provider about:  Any allergies you have.  All medicines you are taking, including vitamins, herbs, eye drops, creams, and over-the-counter medicines.  Any problems you or family members have had with anesthetic medicines.  Types of anesthetics you have had in the past.  Any bleeding disorders you have.  Any surgeries you have had.  Any medical conditions you have.  Any history of heart or lung conditions, such as heart failure, sleep apnea, or chronic obstructive pulmonary disease (COPD).  Whether you are pregnant or may be pregnant.  Whether you use tobacco, alcohol, marijuana, or street drugs.  Any history of Armed forces logistics/support/administrative officer.  Any history of  depression or anxiety. What are the risks? Generally, this is a safe procedure. However, problems may occur, including:  Allergic reaction to anesthetics.  Lung and heart problems.  Inhaling food or liquids from your stomach into your lungs (aspiration).  Injury to nerves.  Waking up during your procedure and being unable to move (rare).  Extreme agitation or a state of mental confusion (delirium) when you wake up from the anesthetic.  Air in the bloodstream, which can lead to stroke.  These problems are more likely to develop if you are having a major surgery or if you have an advanced medical condition. You can prevent some of these complications by answering all of your health care provider's questions thoroughly and by following all pre-procedure instructions. General anesthesia can cause side effects, including:  Nausea or vomiting  A sore throat from the breathing tube.  Feeling cold or shivery.  Feeling tired, washed out, or achy.  Sleepiness or drowsiness.  Confusion or agitation.  What happens before the procedure? Staying hydrated Follow instructions from your health care provider about hydration, which may include:  Up to 2 hours before the procedure - you may continue to drink clear liquids, such as water, clear fruit juice, black coffee, and plain tea.  Eating and drinking restrictions Follow instructions from your health care provider about eating and drinking, which may include:  8 hours before the procedure - stop eating heavy meals or foods such as meat, fried foods, or fatty foods.  6 hours before the procedure - stop eating light meals or foods, such as toast or cereal.  6 hours before the procedure - stop drinking milk or drinks that contain milk.  2 hours before the procedure - stop drinking clear liquids.  Medicines  Ask your health care provider about: ? Changing or stopping your regular medicines. This is especially important if you are  taking diabetes medicines or blood thinners. ? Taking medicines such as aspirin and ibuprofen. These medicines can thin your blood. Do not take these medicines before your procedure if your health care provider instructs you not to. ? Taking new dietary supplements or medicines. Do not take these during the week before your procedure unless your health  care provider approves them.  If you are told to take a medicine or to continue taking a medicine on the day of the procedure, take the medicine with sips of water. General instructions   Ask if you will be going home the same day, the following day, or after a longer hospital stay. ? Plan to have someone take you home. ? Plan to have someone stay with you for the first 24 hours after you leave the hospital or clinic.  For 3-6 weeks before the procedure, try not to use any tobacco products, such as cigarettes, chewing tobacco, and e-cigarettes.  You may brush your teeth on the morning of the procedure, but make sure to spit out the toothpaste. What happens during the procedure?  You will be given anesthetics through a mask and through an IV tube in one of your veins.  You may receive medicine to help you relax (sedative).  As soon as you are asleep, a breathing tube may be used to help you breathe.  An anesthesia specialist will stay with you throughout the procedure. He or she will help keep you comfortable and safe by continuing to give you medicines and adjusting the amount of medicine that you get. He or she will also watch your blood pressure, pulse, and oxygen levels to make sure that the anesthetics do not cause any problems.  If a breathing tube was used to help you breathe, it will be removed before you wake up. The procedure may vary among health care providers and hospitals. What happens after the procedure?  You will wake up, often slowly, after the procedure is complete, usually in a recovery area.  Your blood pressure,  heart rate, breathing rate, and blood oxygen level will be monitored until the medicines you were given have worn off.  You may be given medicine to help you calm down if you feel anxious or agitated.  If you will be going home the same day, your health care provider may check to make sure you can stand, drink, and urinate.  Your health care providers will treat your pain and side effects before you go home.  Do not drive for 24 hours if you received a sedative.  You may: ? Feel nauseous and vomit. ? Have a sore throat. ? Have mental slowness. ? Feel cold or shivery. ? Feel sleepy. ? Feel tired. ? Feel sore or achy, even in parts of your body where you did not have surgery. This information is not intended to replace advice given to you by your health care provider. Make sure you discuss any questions you have with your health care provider. Document Released: 09/25/2007 Document Revised: 11/29/2015 Document Reviewed: 06/02/2015 Elsevier Interactive Patient Education  2018 [place] Anesthesia, Adult, Care After These instructions provide you with information about caring for yourself after your procedure. Your health care provider may also give you more specific instructions. Your treatment has been planned according to current medical practices, but problems sometimes occur. Call your health care provider if you have any problems or questions after your procedure. What can I expect after the procedure? After the procedure, it is common to have:  Vomiting.  A sore throat.  Mental slowness.  It is common to feel:  Nauseous.  Cold or shivery.  Sleepy.  Tired.  Sore or achy, even in parts of your body where you did not have surgery.  Follow these instructions at home: For at least 24 hours after the procedure:  Do not: ? Participate in activities where you could fall or become injured. ? Drive. ? Use heavy machinery. ? Drink alcohol. ? Take sleeping  pills or medicines that cause drowsiness. ? Make important decisions or sign legal documents. ? Take care of children on your own.  Rest. Eating and drinking  If you vomit, drink water, juice, or soup when you can drink without vomiting.  Drink enough fluid to keep your urine clear or pale yellow.  Make sure you have little or no nausea before eating solid foods.  Follow the diet recommended by your health care provider. General instructions  Have a responsible adult stay with you until you are awake and alert.  Return to your normal activities as told by your health care provider. Ask your health care provider what activities are safe for you.  Take over-the-counter and prescription medicines only as told by your health care provider.  If you smoke, do not smoke without supervision.  Keep all follow-up visits as told by your health care provider. This is important. Contact a health care provider if:  You continue to have nausea or vomiting at home, and medicines are not helpful.  You cannot drink fluids or start eating again.  You cannot urinate after 8-12 hours.  You develop a skin rash.  You have fever.  You have increasing redness at the site of your procedure. Get help right away if:  You have difficulty breathing.  You have chest pain.  You have unexpected bleeding.  You feel that you are having a life-threatening or urgent problem. This information is not intended to replace advice given to you by your health care provider. Make sure you discuss any questions you have with your health care provider. Document Released: 09/24/2000 Document Revised: 11/21/2015 Document Reviewed: 06/02/2015 Elsevier Interactive Patient Education  [employer].

## 2018-01-23 ENCOUNTER — Encounter (HOSPITAL_COMMUNITY): Payer: Self-pay

## 2018-01-23 ENCOUNTER — Encounter (HOSPITAL_COMMUNITY)
Admission: RE | Admit: 2018-01-23 | Discharge: 2018-01-23 | Disposition: A | Discharge status: Home / Self Care | Payer: Medicare HMO | Source: Ambulatory Visit | Attending: General Surgery | Admitting: General Surgery

## 2018-01-23 ENCOUNTER — Other Ambulatory Visit: Payer: Self-pay

## 2018-01-23 DIAGNOSIS — K5909 Other constipation: Secondary | ICD-10-CM | POA: Diagnosis not present

## 2018-01-23 DIAGNOSIS — R1011 Right upper quadrant pain: Secondary | ICD-10-CM | POA: Diagnosis present

## 2018-01-23 DIAGNOSIS — R110 Nausea: Secondary | ICD-10-CM | POA: Diagnosis not present

## 2018-01-23 DIAGNOSIS — K811 Chronic cholecystitis: Secondary | ICD-10-CM | POA: Diagnosis not present

## 2018-01-23 DIAGNOSIS — Z791 Long term (current) use of non-steroidal anti-inflammatories (NSAID): Secondary | ICD-10-CM | POA: Diagnosis not present

## 2018-01-23 DIAGNOSIS — Z79899 Other long term (current) drug therapy: Secondary | ICD-10-CM | POA: Diagnosis not present

## 2018-01-23 DIAGNOSIS — K219 Gastro-esophageal reflux disease without esophagitis: Secondary | ICD-10-CM | POA: Diagnosis not present

## 2018-01-23 DIAGNOSIS — I10 Essential (primary) hypertension: Secondary | ICD-10-CM | POA: Diagnosis not present

## 2018-01-23 DIAGNOSIS — F329 Major depressive disorder, single episode, unspecified: Secondary | ICD-10-CM | POA: Diagnosis not present

## 2018-01-23 DIAGNOSIS — F419 Anxiety disorder, unspecified: Secondary | ICD-10-CM | POA: Diagnosis not present

## 2018-01-23 LAB — CBC WITH DIFFERENTIAL/PLATELET
Basophils Absolute: 0.1 10*3/uL (ref 0.0–0.1)
Basophils Relative: 1 %
Eosinophils Absolute: 0.1 10*3/uL (ref 0.0–0.7)
Eosinophils Relative: 2 %
HCT: 39 % (ref 36.0–46.0)
Hemoglobin: 12.3 g/dL (ref 12.0–15.0)
Lymphocytes Relative: 34 %
Lymphs Abs: 2 10*3/uL (ref 0.7–4.0)
MCH: 29.7 pg (ref 26.0–34.0)
MCHC: 31.5 g/dL (ref 30.0–36.0)
MCV: 94.2 fL (ref 78.0–100.0)
Monocytes Absolute: 0.3 10*3/uL (ref 0.1–1.0)
Monocytes Relative: 5 %
Neutro Abs: 3.5 10*3/uL (ref 1.7–7.7)
Neutrophils Relative %: 58 %
Platelets: 404 10*3/uL — ABNORMAL HIGH (ref 150–400)
RBC: 4.14 MIL/uL (ref 3.87–5.11)
RDW: 13.8 % (ref 11.5–15.5)
WBC: 5.9 10*3/uL (ref 4.0–10.5)

## 2018-01-23 LAB — COMPREHENSIVE METABOLIC PANEL
ALT: 38 U/L (ref 0–44)
AST: 28 U/L (ref 15–41)
Albumin: 3.6 g/dL (ref 3.5–5.0)
Alkaline Phosphatase: 93 U/L (ref 38–126)
Anion gap: 7 (ref 5–15)
BUN: 15 mg/dL (ref 8–23)
CO2: 22 mmol/L (ref 22–32)
Calcium: 9 mg/dL (ref 8.9–10.3)
Chloride: 111 mmol/L (ref 98–111)
Creatinine, Ser: 1.06 mg/dL — ABNORMAL HIGH (ref 0.44–1.00)
GFR calc Af Amer: 58 mL/min — ABNORMAL LOW (ref >60)
GFR calc non Af Amer: 50 mL/min — ABNORMAL LOW (ref >60)
Glucose, Bld: 130 mg/dL — ABNORMAL HIGH (ref 70–99)
Potassium: 4.5 mmol/L (ref 3.5–5.1)
Sodium: 140 mmol/L (ref 135–145)
Total Bilirubin: 0.6 mg/dL (ref 0.3–1.2)
Total Protein: 6.7 g/dL (ref 6.5–8.1)

## 2018-01-23 NOTE — OR Nursing (Signed)
Istat 4 to be done on arrival per t.o. Dr. Constance Haw.  Patient notified of potassium level . She is to eat some bananas today and understands we will recheck blood work on arrival tomorrow.

## 2018-01-24 ENCOUNTER — Encounter (HOSPITAL_COMMUNITY)
Admission: RE | Disposition: A | Discharge status: Home / Self Care | Payer: Self-pay | Source: Ambulatory Visit | Attending: General Surgery

## 2018-01-24 ENCOUNTER — Encounter (HOSPITAL_COMMUNITY): Payer: Self-pay

## 2018-01-24 ENCOUNTER — Observation Stay (HOSPITAL_COMMUNITY)
Admission: RE | Admit: 2018-01-24 | Discharge: 2018-01-25 | Disposition: A | Discharge status: Home / Self Care | Payer: Medicare HMO | Source: Ambulatory Visit | Attending: General Surgery | Admitting: General Surgery

## 2018-01-24 ENCOUNTER — Other Ambulatory Visit: Payer: Self-pay

## 2018-01-24 ENCOUNTER — Ambulatory Visit (HOSPITAL_COMMUNITY): Payer: Medicare HMO | Admitting: Anesthesiology

## 2018-01-24 DIAGNOSIS — K219 Gastro-esophageal reflux disease without esophagitis: Secondary | ICD-10-CM | POA: Insufficient documentation

## 2018-01-24 DIAGNOSIS — R110 Nausea: Secondary | ICD-10-CM | POA: Insufficient documentation

## 2018-01-24 DIAGNOSIS — K811 Chronic cholecystitis: Secondary | ICD-10-CM | POA: Diagnosis not present

## 2018-01-24 DIAGNOSIS — K828 Other specified diseases of gallbladder: Secondary | ICD-10-CM

## 2018-01-24 DIAGNOSIS — F419 Anxiety disorder, unspecified: Secondary | ICD-10-CM | POA: Insufficient documentation

## 2018-01-24 DIAGNOSIS — Z79899 Other long term (current) drug therapy: Secondary | ICD-10-CM | POA: Diagnosis not present

## 2018-01-24 DIAGNOSIS — F329 Major depressive disorder, single episode, unspecified: Secondary | ICD-10-CM | POA: Insufficient documentation

## 2018-01-24 DIAGNOSIS — Z791 Long term (current) use of non-steroidal anti-inflammatories (NSAID): Secondary | ICD-10-CM | POA: Diagnosis not present

## 2018-01-24 DIAGNOSIS — K5909 Other constipation: Secondary | ICD-10-CM | POA: Insufficient documentation

## 2018-01-24 DIAGNOSIS — I10 Essential (primary) hypertension: Secondary | ICD-10-CM | POA: Diagnosis not present

## 2018-01-24 HISTORY — PX: CHOLECYSTECTOMY: SHX55

## 2018-01-24 SURGERY — LAPAROSCOPIC CHOLECYSTECTOMY
Anesthesia: General | Site: Abdomen

## 2018-01-24 MED ORDER — DEXAMETHASONE SODIUM PHOSPHATE 4 MG/ML IJ SOLN
INTRAMUSCULAR | Status: AC
Start: 2018-01-24 — End: ?
  Filled 2018-01-24: qty 2

## 2018-01-24 MED ORDER — LACTATED RINGERS IV SOLN
INTRAVENOUS | Status: DC
  Administered 2018-01-24 (×2): via INTRAVENOUS

## 2018-01-24 MED ORDER — DOCUSATE SODIUM 50 MG PO CAPS
50.0000 mg | ORAL_CAPSULE | Freq: Two times a day (BID) | ORAL | Status: DC
  Filled 2018-01-24 (×6): qty 1

## 2018-01-24 MED ORDER — NEBIVOLOL HCL 10 MG PO TABS
5.0000 mg | ORAL_TABLET | Freq: Every day | ORAL | Status: DC
  Administered 2018-01-25: 5 mg via ORAL
  Filled 2018-01-24: qty 1

## 2018-01-24 MED ORDER — ALBUTEROL SULFATE (2.5 MG/3ML) 0.083% IN NEBU: 3.0000 mL | INHALATION_SOLUTION | RESPIRATORY_TRACT | Status: DC | PRN

## 2018-01-24 MED ORDER — OXYCODONE HCL 5 MG PO TABS: 5.0000 mg | ORAL_TABLET | ORAL | 0 refills | Status: DC | PRN

## 2018-01-24 MED ORDER — ONDANSETRON HCL 4 MG/2ML IJ SOLN: 4.0000 mg | Freq: Four times a day (QID) | INTRAMUSCULAR | Status: DC | PRN

## 2018-01-24 MED ORDER — LIDOCAINE HCL (CARDIAC) PF 50 MG/5ML IV SOSY
PREFILLED_SYRINGE | INTRAVENOUS | Status: DC | PRN
  Administered 2018-01-24: 30 mg via INTRAVENOUS

## 2018-01-24 MED ORDER — ONDANSETRON HCL 4 MG/2ML IJ SOLN
INTRAMUSCULAR | Status: DC | PRN
  Administered 2018-01-24 (×2): 4 mg via INTRAVENOUS

## 2018-01-24 MED ORDER — LORATADINE 10 MG PO TABS
10.0000 mg | ORAL_TABLET | Freq: Every day | ORAL | Status: DC
  Administered 2018-01-25: 10 mg via ORAL
  Filled 2018-01-24: qty 1

## 2018-01-24 MED ORDER — FENTANYL CITRATE (PF) 100 MCG/2ML IJ SOLN
INTRAMUSCULAR | Status: AC
  Filled 2018-01-24: qty 2

## 2018-01-24 MED ORDER — FENTANYL CITRATE (PF) 250 MCG/5ML IJ SOLN
INTRAMUSCULAR | Status: AC
Start: 2018-01-24 — End: ?
  Filled 2018-01-24: qty 5

## 2018-01-24 MED ORDER — SIMETHICONE 80 MG PO CHEW: 40.0000 mg | CHEWABLE_TABLET | Freq: Four times a day (QID) | ORAL | Status: DC | PRN

## 2018-01-24 MED ORDER — BUPIVACAINE HCL (PF) 0.5 % IJ SOLN
INTRAMUSCULAR | Status: DC | PRN
  Administered 2018-01-24: 10 mL

## 2018-01-24 MED ORDER — ONDANSETRON HCL 4 MG/2ML IJ SOLN
4.0000 mg | Freq: Once | INTRAMUSCULAR | Status: DC | PRN
  Filled 2018-01-24: qty 2

## 2018-01-24 MED ORDER — DIPHENHYDRAMINE HCL 50 MG/ML IJ SOLN: 12.5000 mg | Freq: Four times a day (QID) | INTRAMUSCULAR | Status: DC | PRN

## 2018-01-24 MED ORDER — OXYCODONE HCL 5 MG PO TABS
5.0000 mg | ORAL_TABLET | ORAL | Status: DC | PRN
  Administered 2018-01-24 – 2018-01-25 (×3): 10 mg via ORAL
  Filled 2018-01-24 (×3): qty 2

## 2018-01-24 MED ORDER — SODIUM CHLORIDE 0.9 % IR SOLN
Status: DC | PRN
  Administered 2018-01-24: 1000 mL

## 2018-01-24 MED ORDER — METOPROLOL TARTRATE 5 MG/5ML IV SOLN: 5.0000 mg | Freq: Four times a day (QID) | INTRAVENOUS | Status: DC | PRN

## 2018-01-24 MED ORDER — MELOXICAM 7.5 MG PO TABS
15.0000 mg | ORAL_TABLET | Freq: Every day | ORAL | Status: DC
  Administered 2018-01-25: 15 mg via ORAL
  Filled 2018-01-24: qty 2

## 2018-01-24 MED ORDER — ALPRAZOLAM 0.5 MG PO TABS
0.5000 mg | ORAL_TABLET | Freq: Every day | ORAL | Status: DC
  Administered 2018-01-24: 0.5 mg via ORAL
  Filled 2018-01-24: qty 1

## 2018-01-24 MED ORDER — DEXAMETHASONE SODIUM PHOSPHATE 4 MG/ML IJ SOLN
INTRAMUSCULAR | Status: DC | PRN
  Administered 2018-01-24: 8 mg via INTRAVENOUS

## 2018-01-24 MED ORDER — MEPERIDINE HCL 50 MG/ML IJ SOLN: 6.2500 mg | INTRAMUSCULAR | Status: DC | PRN

## 2018-01-24 MED ORDER — PROPOFOL 10 MG/ML IV BOLUS
INTRAVENOUS | Status: AC
  Filled 2018-01-24: qty 20

## 2018-01-24 MED ORDER — BUPIVACAINE HCL (PF) 0.5 % IJ SOLN
INTRAMUSCULAR | Status: AC
  Filled 2018-01-24: qty 30

## 2018-01-24 MED ORDER — SEVOFLURANE IN SOLN
RESPIRATORY_TRACT | Status: AC
Start: 2018-01-24 — End: ?
  Filled 2018-01-24: qty 250

## 2018-01-24 MED ORDER — FENTANYL CITRATE (PF) 100 MCG/2ML IJ SOLN
INTRAMUSCULAR | Status: DC | PRN
  Administered 2018-01-24 (×2): 25 ug via INTRAVENOUS
  Administered 2018-01-24: 50 ug via INTRAVENOUS
  Administered 2018-01-24 (×2): 25 ug via INTRAVENOUS
  Administered 2018-01-24: 50 ug via INTRAVENOUS
  Administered 2018-01-24 (×2): 25 ug via INTRAVENOUS

## 2018-01-24 MED ORDER — HYDROCODONE-ACETAMINOPHEN 7.5-325 MG PO TABS: 1.0000 | ORAL_TABLET | Freq: Once | ORAL | Status: DC | PRN

## 2018-01-24 MED ORDER — ONDANSETRON 4 MG PO TBDP: 4.0000 mg | ORAL_TABLET | Freq: Four times a day (QID) | ORAL | Status: DC | PRN

## 2018-01-24 MED ORDER — CHLORHEXIDINE GLUCONATE CLOTH 2 % EX PADS
6.0000 | MEDICATED_PAD | Freq: Once | CUTANEOUS | Status: AC
  Administered 2018-01-24: 6 via TOPICAL

## 2018-01-24 MED ORDER — MORPHINE SULFATE (PF) 2 MG/ML IV SOLN: 1.0000 mg | INTRAVENOUS | Status: DC | PRN

## 2018-01-24 MED ORDER — SERTRALINE HCL 50 MG PO TABS
50.0000 mg | ORAL_TABLET | Freq: Every day | ORAL | Status: DC
  Administered 2018-01-25: 50 mg via ORAL
  Filled 2018-01-24: qty 1

## 2018-01-24 MED ORDER — DIPHENHYDRAMINE HCL 12.5 MG/5ML PO ELIX: 12.5000 mg | ORAL_SOLUTION | Freq: Four times a day (QID) | ORAL | Status: DC | PRN

## 2018-01-24 MED ORDER — CHLORHEXIDINE GLUCONATE CLOTH 2 % EX PADS: 6.0000 | MEDICATED_PAD | Freq: Once | CUTANEOUS | Status: DC

## 2018-01-24 MED ORDER — GABAPENTIN 100 MG PO CAPS
100.0000 mg | ORAL_CAPSULE | Freq: Three times a day (TID) | ORAL | Status: DC
  Administered 2018-01-24 – 2018-01-25 (×2): 100 mg via ORAL
  Filled 2018-01-24 (×2): qty 1

## 2018-01-24 MED ORDER — SUGAMMADEX SODIUM 500 MG/5ML IV SOLN
INTRAVENOUS | Status: DC | PRN
  Administered 2018-01-24: 200 mg via INTRAVENOUS

## 2018-01-24 MED ORDER — PROPOFOL 10 MG/ML IV BOLUS
INTRAVENOUS | Status: DC | PRN
  Administered 2018-01-24: 100 mg via INTRAVENOUS

## 2018-01-24 MED ORDER — HEMOSTATIC AGENTS (NO CHARGE) OPTIME
TOPICAL | Status: DC | PRN
  Administered 2018-01-24 (×2): 1 via TOPICAL

## 2018-01-24 MED ORDER — PANTOPRAZOLE SODIUM 40 MG PO TBEC
40.0000 mg | DELAYED_RELEASE_TABLET | Freq: Every day | ORAL | Status: DC
  Administered 2018-01-25: 40 mg via ORAL
  Filled 2018-01-24: qty 1

## 2018-01-24 MED ORDER — FENTANYL CITRATE (PF) 100 MCG/2ML IJ SOLN
50.0000 ug | INTRAMUSCULAR | Status: DC | PRN
  Administered 2018-01-24: 50 ug via INTRAVENOUS

## 2018-01-24 MED ORDER — SODIUM CHLORIDE 0.9 % IV SOLN
2.0000 g | INTRAVENOUS | Status: AC
  Administered 2018-01-24: 2 g via INTRAVENOUS
  Filled 2018-01-24: qty 2

## 2018-01-24 MED ORDER — SUGAMMADEX SODIUM 500 MG/5ML IV SOLN
INTRAVENOUS | Status: AC
Start: 2018-01-24 — End: ?
  Filled 2018-01-24: qty 5

## 2018-01-24 MED ORDER — HYDROMORPHONE HCL 1 MG/ML IJ SOLN
0.2500 mg | INTRAMUSCULAR | Status: DC | PRN
  Administered 2018-01-24 (×2): 0.5 mg via INTRAVENOUS
  Filled 2018-01-24: qty 0.5

## 2018-01-24 MED ORDER — ROCURONIUM BROMIDE 100 MG/10ML IV SOLN
INTRAVENOUS | Status: DC | PRN
  Administered 2018-01-24: 25 mg via INTRAVENOUS

## 2018-01-24 SURGICAL SUPPLY — 46 items
ADH SKN CLS APL DERMABOND .7 (GAUZE/BANDAGES/DRESSINGS) ×1
APPLIER CLIP ROT 10 11.4 M/L (STAPLE) ×2
APR CLP MED LRG 11.4X10 (STAPLE) ×1
BAG RETRIEVAL 10 (BASKET) ×1
BLADE SURG 15 STRL LF DISP TIS (BLADE) ×1 IMPLANT
BLADE SURG 15 STRL SS (BLADE) ×2
CHLORAPREP W/TINT 26ML (MISCELLANEOUS) ×2 IMPLANT
CLIP APPLIE ROT 10 11.4 M/L (STAPLE) ×1 IMPLANT
CLOTH BEACON ORANGE TIMEOUT ST (SAFETY) ×2 IMPLANT
COVER LIGHT HANDLE STERIS (MISCELLANEOUS) ×4 IMPLANT
DECANTER SPIKE VIAL GLASS SM (MISCELLANEOUS) ×2 IMPLANT
DERMABOND ADVANCED (GAUZE/BANDAGES/DRESSINGS) ×1
DERMABOND ADVANCED .7 DNX12 (GAUZE/BANDAGES/DRESSINGS) ×1 IMPLANT
ELECT REM PT RETURN 9FT ADLT (ELECTROSURGICAL) ×2
ELECTRODE REM PT RTRN 9FT ADLT (ELECTROSURGICAL) ×1 IMPLANT
FILTER SMOKE EVAC LAPAROSHD (FILTER) ×2 IMPLANT
GLOVE BIO SURGEON STRL SZ 6.5 (GLOVE) ×2 IMPLANT
GLOVE BIO SURGEON STRL SZ7 (GLOVE) ×2 IMPLANT
GLOVE BIOGEL PI IND STRL 6.5 (GLOVE) ×1 IMPLANT
GLOVE BIOGEL PI IND STRL 7.0 (GLOVE) ×3 IMPLANT
GLOVE BIOGEL PI INDICATOR 6.5 (GLOVE) ×1
GLOVE BIOGEL PI INDICATOR 7.0 (GLOVE) ×3
GOWN STRL REUS W/TWL LRG LVL3 (GOWN DISPOSABLE) ×6 IMPLANT
HEMOSTAT SNOW SURGICEL 2X4 (HEMOSTASIS) ×3 IMPLANT
INST SET LAPROSCOPIC AP (KITS) ×2 IMPLANT
IV NS IRRIG 3000ML ARTHROMATIC (IV SOLUTION) IMPLANT
KIT TURNOVER KIT A (KITS) ×2 IMPLANT
MANIFOLD NEPTUNE II (INSTRUMENTS) ×2 IMPLANT
NDL INSUFFLATION 14GA 120MM (NEEDLE) ×1 IMPLANT
NEEDLE INSUFFLATION 14GA 120MM (NEEDLE) ×2 IMPLANT
NS IRRIG 1000ML POUR BTL (IV SOLUTION) ×2 IMPLANT
PACK LAP CHOLE LZT030E (CUSTOM PROCEDURE TRAY) ×2 IMPLANT
PAD ARMBOARD 7.5X6 YLW CONV (MISCELLANEOUS) ×2 IMPLANT
SET BASIN LINEN APH (SET/KITS/TRAYS/PACK) ×2 IMPLANT
SET TUBE IRRIG SUCTION NO TIP (IRRIGATION / IRRIGATOR) IMPLANT
SLEEVE ENDOPATH XCEL 5M (ENDOMECHANICALS) ×2 IMPLANT
SUT MNCRL AB 4-0 PS2 18 (SUTURE) ×2 IMPLANT
SUT VICRYL 0 UR6 27IN ABS (SUTURE) ×2 IMPLANT
SYS BAG RETRIEVAL 10MM (BASKET) ×1
SYSTEM BAG RETRIEVAL 10MM (BASKET) ×1 IMPLANT
TROCAR ENDO BLADELESS 11MM (ENDOMECHANICALS) ×2 IMPLANT
TROCAR XCEL NON-BLD 5MMX100MML (ENDOMECHANICALS) ×2 IMPLANT
TROCAR XCEL UNIV SLVE 11M 100M (ENDOMECHANICALS) ×2 IMPLANT
TUBE CONNECTING 12X1/4 (SUCTIONS) ×2 IMPLANT
TUBING INSUFFLATION (TUBING) ×2 IMPLANT
WARMER LAPAROSCOPE (MISCELLANEOUS) ×2 IMPLANT

## 2018-01-24 NOTE — Anesthesia Preprocedure Evaluation (Signed)
Anesthesia Evaluation  Patient identified by MRN, date of birth, ID band Patient awake    Reviewed: Allergy & Precautions, NPO status , Patient's Chart, lab work & pertinent test results, reviewed documented beta blocker date and time   Airway Mallampati: II  TM Distance: >3 FB     Dental  (+) Teeth Intact   Pulmonary shortness of breath and with exertion, pneumonia, resolved,    breath sounds clear to auscultation       Cardiovascular hypertension, Pt. on medications and Pt. on home beta blockers  Rhythm:Regular Rate:Normal     Neuro/Psych PSYCHIATRIC DISORDERS Anxiety Depression    GI/Hepatic GERD  Medicated and Controlled,  Endo/Other    Renal/GU      Musculoskeletal  (+) Arthritis ,   Abdominal   Peds  Hematology   Anesthesia Other Findings   Reproductive/Obstetrics                             Anesthesia Physical  Anesthesia Plan  ASA: III  Anesthesia Plan: General ETT   Post-op Pain Management:    Induction: Intravenous  PONV Risk Score and Plan:   Airway Management Planned: Oral ETT  Additional Equipment:   Intra-op Plan:   Post-operative Plan: Extubation in OR  Informed Consent: I have reviewed the patients History and Physical, chart, labs and discussed the procedure including the risks, benefits and alternatives for the proposed anesthesia with the patient or authorized representative who has indicated his/her understanding and acceptance.     Plan Discussed with: CRNA  Anesthesia Plan Comments:         Anesthesia Quick Evaluation

## 2018-01-24 NOTE — Transfer of Care (Signed)
Immediate Anesthesia Transfer of Care Note  Patient: Audrey Johnson  Procedure(s) Performed: LAPAROSCOPIC CHOLECYSTECTOMY (N/A Abdomen)  Patient Location: PACU  Anesthesia Type:General  Level of Consciousness: awake and patient cooperative  Airway & Oxygen Therapy: Patient Spontanous Breathing and non-rebreather face mask  Post-op Assessment: Report given to RN and Post -op Vital signs reviewed and stable  Post vital signs: Reviewed and stable  Last Vitals:  Vitals Value Taken Time  BP    Temp    Pulse    Resp    SpO2      Last Pain:  Vitals:   01/24/18 1245  TempSrc:   PainSc: Asleep      Patients Stated Pain Goal: 9 (83/72/90 2111)  Complications: No apparent anesthesia complications

## 2018-01-24 NOTE — Anesthesia Postprocedure Evaluation (Signed)
Anesthesia Post Note  Patient: Audrey Johnson  Procedure(s) Performed: LAPAROSCOPIC CHOLECYSTECTOMY (N/A Abdomen)  Patient location during evaluation: PACU Anesthesia Type: General Level of consciousness: awake and patient cooperative Pain management: satisfactory to patient Vital Signs Assessment: post-procedure vital signs reviewed and stable Respiratory status: spontaneous breathing and patient connected to nasal cannula oxygen Cardiovascular status: stable : Nausea resolved at present. Anesthetic complications: no     Last Vitals:  Vitals:   01/24/18 1645 01/24/18 1700  BP:  (!) 144/63  Pulse: 85 79  Resp: 20 19  Temp:    SpO2: 98% 99%    Last Pain:  Vitals:   01/24/18 1700  TempSrc:   PainSc: Asleep                 Klyde Banka

## 2018-01-24 NOTE — Progress Notes (Signed)
This note also relates to the following rows which could not be included: Pulse Rate - Cannot attach notes to unvalidated device data ECG Heart Rate - Cannot attach notes to unvalidated device data Resp - Cannot attach notes to unvalidated device data BP - Cannot attach notes to unvalidated device data

## 2018-01-24 NOTE — Discharge Instructions (Signed)
Discharge Instructions: Shower per your regular routine. Take tylenol and ibuprofen as needed for pain control, alternating every 4-6 hours.  Take Roxicodone for breakthrough pain. Take colace for constipation related to narcotic pain medication. Do not pick at the dermabond glue on your incision sites.    Laparoscopic Cholecystectomy, Care After This sheet gives you information about how to care for yourself after your procedure. Your doctor may also give you more specific instructions. If you have problems or questions, contact your doctor. Follow these instructions at home: Care for cuts from surgery (incisions)   Follow instructions from your doctor about how to take care of your cuts from surgery. Make sure you: ? Wash your hands with soap and water before you change your bandage (dressing). If you cannot use soap and water, use hand sanitizer. ? Change your bandage as told by your doctor. ? Leave stitches (sutures), skin glue, or skin tape (adhesive) strips in place. They may need to stay in place for 2 weeks or longer. If tape strips get loose and curl up, you may trim the loose edges. Do not remove tape strips completely unless your doctor says it is okay.  Do not take baths, swim, or use a hot tub until your doctor says it is okay.   Check your surgical cut area every day for signs of infection. Check for: ? More redness, swelling, or pain. ? More fluid or blood. ? Warmth. ? Pus or a bad smell. Activity  Do not drive or use heavy machinery while taking prescription pain medicine.  Do not lift anything that is heavier than 10 lb (4.5 kg) until your doctor says it is okay.  Do not play contact sports until your doctor says it is okay.  Do not drive for 24 hours if you were given a medicine to help you relax (sedative).  Rest as needed. Do not return to work or school until your doctor says it is okay. General instructions  Take over-the-counter and prescription medicines  only as told by your doctor.  To prevent or treat constipation while you are taking prescription pain medicine, your doctor may recommend that you: ? Drink enough fluid to keep your pee (urine) clear or pale yellow. ? Take over-the-counter or prescription medicines. ? Eat foods that are high in fiber, such as fresh fruits and vegetables, whole grains, and beans. ? Limit foods that are high in fat and processed sugars, such as fried and sweet foods. Contact a doctor if:  You develop a rash.  You have more redness, swelling, or pain around your surgical cuts.  You have more fluid or blood coming from your surgical cuts.  Your surgical cuts feel warm to the touch.  You have pus or a bad smell coming from your surgical cuts.  You have a fever.  One or more of your surgical cuts breaks open. Get help right away if:  You have trouble breathing.  You have chest pain.  You have pain that is getting worse in your shoulders.  You faint or feel dizzy when you stand.  You have very bad pain in your belly (abdomen).  You are sick to your stomach (nauseous) for more than one day.  You have throwing up (vomiting) that lasts for more than one day.  You have leg pain. This information is not intended to replace advice given to you by your health care provider. Make sure you discuss any questions you have with your health care provider. Document  Released: 03/27/2008 Document Revised: 01/07/2016 Document Reviewed: 12/05/2015 Elsevier Interactive Patient Education  [employer].

## 2018-01-24 NOTE — Anesthesia Procedure Notes (Signed)
Procedure Name: Intubation Date/Time: 01/24/2018 3:18 PM Performed by: Ollen Bowl, CRNA Pre-anesthesia Checklist: Patient identified, Patient being monitored, Timeout performed, Emergency Drugs available and Suction available Patient Re-evaluated:Patient Re-evaluated prior to induction Oxygen Delivery Method: Circle System Utilized Preoxygenation: Pre-oxygenation with 100% oxygen Induction Type: IV induction Ventilation: Mask ventilation without difficulty Laryngoscope Size: Mac and 3 Grade View: Grade II Tube type: Oral Tube size: 7.0 mm Number of attempts: 1 Airway Equipment and Method: stylet Placement Confirmation: ETT inserted through vocal cords under direct vision,  positive ETCO2 and breath sounds checked- equal and bilateral Secured at: 21 cm Tube secured with: Tape Dental Injury: Teeth and Oropharynx as per pre-operative assessment

## 2018-01-24 NOTE — Interval H&P Note (Signed)
History and Physical Interval Note:  01/24/2018 1:14 PM  Audrey Johnson  has presented today for surgery, with the diagnosis of billiary dyskenisia  The various methods of treatment have been discussed with the patient and family. After consideration of risks, benefits and other options for treatment, the patient has consented to  Procedure(s): LAPAROSCOPIC CHOLECYSTECTOMY (N/A) as a surgical intervention .  The patient's history has been reviewed, patient examined, no change in status, stable for surgery.  I have reviewed the patient's chart and labs.  Questions were answered to the patient's satisfaction.    No additional questions. Feeling about the same.  Virl Cagey

## 2018-01-24 NOTE — Progress Notes (Signed)
Daughter Larene Beach took possession of patients clothing and car keys.  No purse in locker.  Family stated that she must have left it in her car.

## 2018-01-24 NOTE — Op Note (Signed)
Operative Note   Preoperative Diagnosis: Biliary Dyskinesia   Postoperative Diagnosis: Biliary dyskinesia, Cira Rue Curtis Adhesions    Procedure(s) Performed: Laparoscopic cholecystectomy, lysis of adhesions    Surgeon: Lanell Matar. Constance Haw, MD   Assistants: No qualified resident was available   Anesthesia: General endotracheal   Anesthesiologist: Jefferson Fuel, MD    Specimens: Gallbladder    Estimated Blood Loss: Minimal    Blood Replacement: None    Complications: None    Operative Findings:  Dense adhesions of the liver to the anterior abdominal wall and gallbladder densely adhered to the anterior abdominal wall  Liver on either side and gallbladder adherent and decompressed, hanging from the anterior abdominal wall       Procedure: The patient was taken to the operating room and placed supine. General endotracheal anesthesia was induced. Intravenous antibiotics were administered per protocol. An orogastric tube positioned to decompress the stomach. The abdomen was prepared and draped in the usual sterile fashion.    A supraumbilical incision was made and a Veress technique was utilized to achieve pneumoperitoneum to 15 mmHg with carbon dioxide. A 11 mm optiview port was placed through the supraumbilical region, and a 10 mm 0-degree operative laparoscope was introduced. The area underlying the trocar and Veress needle were inspected and without evidence of injury.    There were significant adhesions to the anterior abdominal wall and the gallbladder was decompressed and adherent to the anterior abdominal wall (see pictures now).  Remaining trocars were placed under direct vision. Two 5 mm ports were placed in the right abdomen, between the anterior axillary and midclavicular line.  A final 11 mm port was placed through the mid-epigastrium, near the falciform ligament.  The adhesions to the liver and the anterior abdominal wall and the gallbladder and the abdominal wall were  taken down. When doing this I saw that the 11 mm trocar had actually going through a very thin layer of liver that had been tacked up in the adhesions, the trocar was pulled back and the liver was hemostatic without bleeding from the edge.    Once the adhesions were taken down from the anterior abdominal wall, the adhesions between the gallbladder and duodenum were gently swept down as the duodenum was also being tented upward. After all the adhesions were down, the gallbladder could be grasped.    The gallbladder fundus was elevated cephalad and the infundibulum was retracted to the patient's right. The gallbladder/cystic duct junction was skeletonized. The cystic artery noted in the triangle of Calot and was also skeletonized.  We then continued liberal medial and lateral dissection until the critical view of safety was achieved.    The cystic duct and cystic artery were doubly clipped and divided. There was an anterior branch of the cystic artery coming toward and running with the cystic duct that was doubly clipped and ligated.   The gallbladder was then dissected from the liver bed with electrocautery.  There was a second small < 36mm size appearing tubular structure going into the gallbladder that I clipped with a single clip, because I could not tell if it was actually a vessel or a duct of luschka. The specimen was placed in an Endopouch and was retrieved through the epigastric site.   Final inspection revealed acceptable hemostasis. Surgical Emogene Morgan was placed in the gallbladder bed and on the cut edge of the liver where the trocar had been placed.  The epigastric port site was on the edge of the rib  and the umbilical site was smaller than the tip of my finger and these were not closed with vicryl. Trocars were removed and pneumoperitoneum was released. Skin incisions were closed with 4-0 Monocryl subcuticular sutures and Dermabond. The patient was awakened from anesthesia and extubated without  complication.    Curlene Labrum, MD [facility] [address] [place], [place] [address] [phone] (office)

## 2018-01-25 DIAGNOSIS — Z791 Long term (current) use of non-steroidal anti-inflammatories (NSAID): Secondary | ICD-10-CM | POA: Diagnosis not present

## 2018-01-25 DIAGNOSIS — F419 Anxiety disorder, unspecified: Secondary | ICD-10-CM | POA: Diagnosis not present

## 2018-01-25 DIAGNOSIS — K811 Chronic cholecystitis: Secondary | ICD-10-CM | POA: Diagnosis not present

## 2018-01-25 DIAGNOSIS — K219 Gastro-esophageal reflux disease without esophagitis: Secondary | ICD-10-CM | POA: Diagnosis not present

## 2018-01-25 DIAGNOSIS — Z79899 Other long term (current) drug therapy: Secondary | ICD-10-CM | POA: Diagnosis not present

## 2018-01-25 DIAGNOSIS — K5909 Other constipation: Secondary | ICD-10-CM | POA: Diagnosis not present

## 2018-01-25 DIAGNOSIS — R110 Nausea: Secondary | ICD-10-CM | POA: Diagnosis not present

## 2018-01-25 DIAGNOSIS — I10 Essential (primary) hypertension: Secondary | ICD-10-CM | POA: Diagnosis not present

## 2018-01-25 DIAGNOSIS — F329 Major depressive disorder, single episode, unspecified: Secondary | ICD-10-CM | POA: Diagnosis not present

## 2018-01-25 MED ORDER — DOCUSATE SODIUM 100 MG PO CAPS
100.0000 mg | ORAL_CAPSULE | Freq: Two times a day (BID) | ORAL | Status: DC
  Administered 2018-01-25: 100 mg via ORAL
  Filled 2018-01-25: qty 1

## 2018-01-25 NOTE — Discharge Summary (Signed)
Physician Discharge Summary  Patient ID: Audrey Johnson MRN: [id number] DOB/AGE: December 13, 1940 77 y.o.  Admit date: 01/24/2018 Discharge date: 01/25/2018  Admission Diagnoses: Biliary Dyskinesia  Discharge Diagnoses:  Principal Problem:   Biliary dyskinesia   Discharged Condition: good  Hospital Course: Audrey Johnson was admitted post op after having nausea that was not controlled. She stayed overnight, and in the AM was feeling much better.  She had adequate pain control and was tolerating a diet.   Consults: None  Significant Diagnostic Studies: None  Treatments: surgery: 7/26 Laparoscopic cholecystectomy  Discharge Exam: Blood pressure 117/61, pulse 74, temperature 98.7 F (37.1 C), temperature source Oral, resp. rate 20, height 5\' 4"  (1.626 m), weight 150 lb (68 kg), SpO2 96 %. General appearance: alert, cooperative and no distress Resp: normal work breathing GI: soft, appropriately tender, port sites c/d/i with dermabond  Disposition: Discharge disposition: 01-Home or Self Care       Discharge Instructions    Call MD for:  difficulty breathing, headache or visual disturbances   Complete by:  As directed    Call MD for:  extreme fatigue   Complete by:  As directed    Call MD for:  persistant dizziness or light-headedness   Complete by:  As directed    Call MD for:  persistant nausea and vomiting   Complete by:  As directed    Call MD for:  redness, tenderness, or signs of infection (pain, swelling, redness, odor or green/yellow discharge around incision site)   Complete by:  As directed    Call MD for:  severe uncontrolled pain   Complete by:  As directed    Call MD for:  temperature >100.4   Complete by:  As directed    Diet - low sodium heart healthy   Complete by:  As directed    Increase activity slowly   Complete by:  As directed      Allergies as of 01/25/2018   No Known Allergies     Medication List    TAKE these medications   ALPRAZolam 0.5  MG tablet Commonly known as:  XANAX Take 1 tablet (0.5 mg total) by mouth at bedtime.   aspirin 81 MG tablet Take 2 tablets by mouth daily as needed for pain.   BYSTOLIC 5 MG tablet Generic drug:  nebivolol Take 5 mg by mouth daily.   calcium carbonate 500 MG chewable tablet Commonly known as:  TUMS - dosed in mg elemental calcium Chew 1 tablet by mouth as needed for indigestion or heartburn.   diclofenac sodium 1 % Gel Commonly known as:  VOLTAREN Apply 1 application topically as needed.   docusate sodium 50 MG capsule Commonly known as:  COLACE Take 50 mg by mouth 2 (two) times daily.   gabapentin 100 MG capsule Commonly known as:  NEURONTIN Take 1 capsule (100 mg total) by mouth 3 (three) times daily.   lisinopril 10 MG tablet Commonly known as:  PRINIVIL,ZESTRIL Take 10 mg by mouth daily.   loratadine 10 MG tablet Commonly known as:  CLARITIN Take 10 mg by mouth daily.   megestrol 40 MG/ML suspension Commonly known as:  MEGACE Take 10 mLs by mouth daily.   meloxicam 15 MG tablet Commonly known as:  MOBIC Take 1 tablet (15 mg total) by mouth daily.   omega-3 fish oil 1000 MG Caps capsule Commonly known as:  MAXEPA Take 1 tablet by mouth daily.   omeprazole 20 MG capsule Commonly known as:  PRILOSEC TAKE 1 CAPSULE BY MOUTH EVERY DAY   oxyCODONE 5 MG immediate release tablet Commonly known as:  ROXICODONE Take 1 tablet (5 mg total) by mouth every 4 (four) hours as needed for severe pain or breakthrough pain.   sertraline 50 MG tablet Commonly known as:  ZOLOFT Take 1 tablet (50 mg total) by mouth daily.   VENTOLIN HFA 108 (90 Base) MCG/ACT inhaler Generic drug:  albuterol INHALE 1 TO 2 PUFFS EVERY 4 TO 6 HOURS AS NEEDED SHORTNESS OF BREATH      Follow-up Information    Virl Cagey, MD In 2 weeks.   Specialty:  General Surgery Contact information: [address] [place] [address] [phone]           Signed: Virl Cagey 01/25/2018, 11:24 AM

## 2018-01-25 NOTE — Anesthesia Postprocedure Evaluation (Signed)
Anesthesia Post Note  Patient: Audrey Johnson  Procedure(s) Performed: LAPAROSCOPIC CHOLECYSTECTOMY (N/A Abdomen)  Patient location during evaluation: Nursing Unit Anesthesia Type: General Level of consciousness: awake and alert and patient cooperative Pain management: pain level controlled Vital Signs Assessment: post-procedure vital signs reviewed and stable Respiratory status: spontaneous breathing, nonlabored ventilation and respiratory function stable Cardiovascular status: blood pressure returned to baseline Postop Assessment: no apparent nausea or vomiting, able to ambulate and adequate PO intake Anesthetic complications: no     Last Vitals:  Vitals:   01/24/18 2121 01/25/18 0515  BP: (!) 148/70 117/61  Pulse: 86 74  Resp: 20 20  Temp: 37.3 C 37.1 C  SpO2: 98% 96%    Last Pain:  Vitals:   01/25/18 0942  TempSrc:   PainSc: 3                  Samya Siciliano J

## 2018-01-25 NOTE — Addendum Note (Signed)
Addendum  created 01/25/18 1154 by Charmaine Downs, CRNA   Sign clinical note

## 2018-01-25 NOTE — Progress Notes (Signed)
[place]  Discharge home.  Curlene Labrum, MD [facility] [address] [place], [place] [address] [phone] (office)

## 2018-01-25 NOTE — Progress Notes (Signed)
IV removed, 2x2 gauze and paper tape applied to site, patient tolerated well.  Reviewed AVS with patient who verbalized understanding.  Patient to be transported home by son.

## 2018-01-27 ENCOUNTER — Encounter (HOSPITAL_COMMUNITY): Payer: Self-pay | Admitting: General Surgery

## 2018-02-04 ENCOUNTER — Encounter: Payer: Self-pay | Admitting: General Surgery

## 2018-02-04 ENCOUNTER — Ambulatory Visit (INDEPENDENT_AMBULATORY_CARE_PROVIDER_SITE_OTHER): Payer: Self-pay | Admitting: General Surgery

## 2018-02-04 VITALS — BP 150/65 | HR 75 | Temp 98.0°F | Resp 20 | Wt 155.0 lb

## 2018-02-04 DIAGNOSIS — I1310 Hypertensive heart and chronic kidney disease without heart failure, with stage 1 through stage 4 chronic kidney disease, or unspecified chronic kidney disease: Secondary | ICD-10-CM | POA: Diagnosis not present

## 2018-02-04 DIAGNOSIS — K219 Gastro-esophageal reflux disease without esophagitis: Secondary | ICD-10-CM | POA: Diagnosis not present

## 2018-02-04 DIAGNOSIS — R7301 Impaired fasting glucose: Secondary | ICD-10-CM | POA: Diagnosis not present

## 2018-02-04 DIAGNOSIS — I10 Essential (primary) hypertension: Secondary | ICD-10-CM | POA: Diagnosis not present

## 2018-02-04 DIAGNOSIS — K811 Chronic cholecystitis: Secondary | ICD-10-CM | POA: Insufficient documentation

## 2018-02-04 DIAGNOSIS — N183 Chronic kidney disease, stage 3 (moderate): Secondary | ICD-10-CM | POA: Diagnosis not present

## 2018-02-04 DIAGNOSIS — F411 Generalized anxiety disorder: Secondary | ICD-10-CM | POA: Diagnosis not present

## 2018-02-04 DIAGNOSIS — E782 Mixed hyperlipidemia: Secondary | ICD-10-CM | POA: Diagnosis not present

## 2018-02-04 NOTE — Patient Instructions (Signed)
Diet and activity as tolerated.

## 2018-02-04 NOTE — Progress Notes (Signed)
[facility] Note   HPI:  77 y.o. Female presents to clinic for post-op follow-up evaluation after a lap chole. Patient reports she is doing well. She still having constipation but otherwise her pain and bloating is improved.   Review of Systems:   No fevers or chills  All other review of systems: otherwise negative   Pathology: Diagnosis Gallbladder - CHRONIC CHOLECYSTITIS. - CHOLESTEROLOSIS.  Vital Signs:  BP (!) 150/65   Pulse 75   Temp 98 F (36.7 C) (Temporal)   Resp 20   Wt 155 lb (70.3 kg)   BMI 26.61 kg/m    Physical Exam:  Physical Exam  Constitutional: She is oriented to person, place, and time. She appears well-developed.  HENT:  Head: Normocephalic.  Cardiovascular: Normal rate.  Pulmonary/Chest: Effort normal.  Abdominal: Soft. She exhibits no distension. There is no tenderness.  Port sites healing, dermabond peeling  Neurological: She is alert and oriented to person, place, and time.  Skin: Skin is warm and dry.  Vitals reviewed.   Laboratory studies: None   Imaging:  None    Assessment:  77 y.o. yo Female s/p Lap chole for chronic cholecystitis. Doing well.  Plan:  - Activity and diet as tolerated   - Try miralax/ colace for constipation, and go see Dr. Gala Romney if continues for more recommendations  - PRN follow up    All of the above recommendations were discussed with the patient, and all of patient's questions were answered to her expressed satisfaction.  Curlene Labrum, MD [facility] [address] [place], [place] [address] [phone] (office)

## 2018-02-06 ENCOUNTER — Ambulatory Visit: Payer: Medicare HMO | Admitting: General Surgery

## 2018-02-13 DIAGNOSIS — E782 Mixed hyperlipidemia: Secondary | ICD-10-CM | POA: Diagnosis not present

## 2018-02-13 DIAGNOSIS — N183 Chronic kidney disease, stage 3 (moderate): Secondary | ICD-10-CM | POA: Diagnosis not present

## 2018-02-13 DIAGNOSIS — R7301 Impaired fasting glucose: Secondary | ICD-10-CM | POA: Diagnosis not present

## 2018-02-13 DIAGNOSIS — I10 Essential (primary) hypertension: Secondary | ICD-10-CM | POA: Diagnosis not present

## 2018-02-17 DIAGNOSIS — N183 Chronic kidney disease, stage 3 (moderate): Secondary | ICD-10-CM | POA: Diagnosis not present

## 2018-02-17 DIAGNOSIS — F39 Unspecified mood [affective] disorder: Secondary | ICD-10-CM | POA: Diagnosis not present

## 2018-02-17 DIAGNOSIS — R109 Unspecified abdominal pain: Secondary | ICD-10-CM | POA: Diagnosis not present

## 2018-02-17 DIAGNOSIS — F419 Anxiety disorder, unspecified: Secondary | ICD-10-CM | POA: Diagnosis not present

## 2018-02-17 DIAGNOSIS — K219 Gastro-esophageal reflux disease without esophagitis: Secondary | ICD-10-CM | POA: Diagnosis not present

## 2018-02-17 DIAGNOSIS — R7301 Impaired fasting glucose: Secondary | ICD-10-CM | POA: Diagnosis not present

## 2018-02-17 DIAGNOSIS — D696 Thrombocytopenia, unspecified: Secondary | ICD-10-CM | POA: Diagnosis not present

## 2018-02-17 DIAGNOSIS — E782 Mixed hyperlipidemia: Secondary | ICD-10-CM | POA: Diagnosis not present

## 2018-02-17 DIAGNOSIS — I129 Hypertensive chronic kidney disease with stage 1 through stage 4 chronic kidney disease, or unspecified chronic kidney disease: Secondary | ICD-10-CM | POA: Diagnosis not present

## 2018-02-27 ENCOUNTER — Encounter (HOSPITAL_COMMUNITY): Payer: Self-pay | Admitting: Psychiatry

## 2018-02-27 ENCOUNTER — Ambulatory Visit (HOSPITAL_COMMUNITY): Payer: Self-pay | Admitting: Psychiatry

## 2018-02-27 ENCOUNTER — Ambulatory Visit (INDEPENDENT_AMBULATORY_CARE_PROVIDER_SITE_OTHER): Payer: Medicare HMO | Admitting: Psychiatry

## 2018-02-27 VITALS — BP 125/70 | HR 79 | Ht 64.0 in | Wt 152.0 lb

## 2018-02-27 DIAGNOSIS — F321 Major depressive disorder, single episode, moderate: Secondary | ICD-10-CM

## 2018-02-27 MED ORDER — ALPRAZOLAM 0.5 MG PO TABS: 0.5000 mg | ORAL_TABLET | Freq: Every day | ORAL | 3 refills | Status: DC

## 2018-02-27 MED ORDER — SERTRALINE HCL 50 MG PO TABS: 50.0000 mg | ORAL_TABLET | Freq: Every day | ORAL | 2 refills | Status: DC

## 2018-02-27 NOTE — Progress Notes (Signed)
BH MD/PA/NP OP Progress Note  02/27/2018 3:24 PM Audrey Johnson  MRN:  [id number]  Chief Complaint:  Chief Complaint    Depression; Anxiety; Follow-up     HPI: This patient is a 77 year old widowed black female who lives alone in [place].  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  The patient returns for follow-up for depression and anxiety.  A few years ago she got depressed and very delusional she was hospitalized for a brief time and since then has done well as an outpatient.  The patient had cholecystectomy in mid July.  She is feeling a lot better since then.  Apparently she had a lot of adhesions to the abdominal wall that were causing pain.  She now is more constipation but she no longer has abdominal pain.  She still has knee and back pain but overall feels much better.  She has stopped the gabapentin and only takes Zoloft and 1/2 mg of Xanax at bedtime.  She denies being depressed or anxious and she is sleeping well. Visit Diagnosis:    ICD-10-CM   1. Major depressive disorder, single episode, moderate (HCC) F32.1     Past Psychiatric History: One prior hospitalization for psychotic depression  Past Medical History:  Past Medical History:  Diagnosis Date  . Anxiety   . Arthritis    in back .   Marland Kitchen Borderline diabetes   . Chronic abdominal pain   . Chronic back pain   . Chronic constipation   . Chronic nausea   . Depression   . GERD (gastroesophageal reflux disease)   . High cholesterol   . Hypertension   . Noncompliance with medication regimen   . Pneumonia   . Poor historian   . Shortness of breath     even walking causes sob...     Past Surgical History:  Procedure Laterality Date  . ABDOMINAL HYSTERECTOMY    . BREAST SURGERY  unknown (many yrs)   excision  of lump right breast  was per pt "nothing"   . CARDIAC CATHETERIZATION    . CATARACT EXTRACTION W/PHACO Left 02/21/2015   Procedure: CATARACT EXTRACTION PHACO AND INTRAOCULAR  LENS PLACEMENT LEFT EYE CDE=5.86;  Surgeon: Tonny Branch, MD;  Location: [facility];  Service: Ophthalmology;  Laterality: Left;  . CATARACT EXTRACTION W/PHACO Right 03/14/2015   Procedure: CATARACT EXTRACTION PHACO AND INTRAOCULAR LENS PLACEMENT ([place]);  Surgeon: Tonny Branch, MD;  Location: [facility];  Service: Ophthalmology;  Laterality: Right;  CDE:6.36  . CHOLECYSTECTOMY N/A 01/24/2018   Procedure: LAPAROSCOPIC CHOLECYSTECTOMY;  Surgeon: Virl Cagey, MD;  Location: [facility];  Service: General;  Laterality: N/A;  . COLONOSCOPY  04/16/2007   RMR: Normal rectum and colon  . COLONOSCOPY WITH ESOPHAGOGASTRODUODENOSCOPY (EGD) N/A 01/08/2013   OVF:IEPP erosive reflux esophagitis. Negative H.pylori. Small hiatal hernia-s/p bx (gastritis)-TCS/Colonic polyp-hyperplastic  . DILATION AND CURETTAGE OF UTERUS    . ESOPHAGOGASTRODUODENOSCOPY (EGD) WITH PROPOFOL N/A 02/14/2017   DR. Rourk: normal esophagus, small hiatal hernia  . LEFT HEART CATHETERIZATION WITH CORONARY ANGIOGRAM N/A 06/05/2012   Procedure: LEFT HEART CATHETERIZATION WITH CORONARY ANGIOGRAM;  Surgeon: Hillary Bow, MD;  Location: [facility] CATH LAB;  Service: Cardiovascular;  Laterality: N/A;  . TOE SURGERY Right    5th toe, not sure what was wrong with it    Family Psychiatric History: See below  Family History:  Family History  Problem Relation Age of Onset  . Lung disease Mother   . Cancer Father   .  Heart disease Sister   . Colon cancer Neg Hx     Social History:  Social History   Socioeconomic History  . Marital status: Widowed    Spouse name: Not on file  . Number of children: Not on file  . Years of education: Not on file  . Highest education level: Not on file  Occupational History  . Not on file  Social Needs  . Financial resource strain: Not on file  . Food insecurity:    Worry: Not on file    Inability: Not on file  . Transportation needs:    Medical: Not on file    Non-medical: Not on file  Tobacco Use  . Smoking  status: Never Smoker  . Smokeless tobacco: Never Used  . Tobacco comment: Never smoked  Substance and Sexual Activity  . Alcohol use: No  . Drug use: No  . Sexual activity: Never    Birth control/protection: Surgical  Lifestyle  . Physical activity:    Days per week: Not on file    Minutes per session: Not on file  . Stress: Not on file  Relationships  . Social connections:    Talks on phone: Not on file    Gets together: Not on file    Attends religious service: Not on file    Active member of club or organization: Not on file    Attends meetings of clubs or organizations: Not on file    Relationship status: Not on file  Other Topics Concern  . Not on file  Social History Narrative  . Not on file    Allergies: No Known Allergies  Metabolic Disorder Labs: No results found for: HGBA1C, MPG No results found for: PROLACTIN No results found for: CHOL, TRIG, HDL, CHOLHDL, VLDL, LDLCALC Lab Results  Component Value Date   TSH 2.821 07/17/2010    Therapeutic Level Labs: No results found for: LITHIUM No results found for: VALPROATE No components found for:  CBMZ  Current Medications: Current Outpatient Medications  Medication Sig Dispense Refill  . ALPRAZolam (XANAX) 0.5 MG tablet Take 1 tablet (0.5 mg total) by mouth at bedtime. 30 tablet 3  . aspirin 81 MG tablet Take 2 tablets by mouth daily as needed for pain.     Marland Kitchen atorvastatin (LIPITOR) 20 MG tablet Take 20 mg by mouth at bedtime.    Marland Kitchen BYSTOLIC 5 MG tablet Take 5 mg by mouth daily.     . calcium carbonate (TUMS - DOSED IN MG ELEMENTAL CALCIUM) 500 MG chewable tablet Chew 1 tablet by mouth as needed for indigestion or heartburn.    . diclofenac sodium (VOLTAREN) 1 % GEL Apply 1 application topically as needed.  3  . docusate sodium (COLACE) 50 MG capsule Take 50 mg by mouth 2 (two) times daily.    Marland Kitchen lisinopril (PRINIVIL,ZESTRIL) 10 MG tablet Take 10 mg by mouth daily.    Marland Kitchen omega-3 fish oil (MAXEPA) 1000 MG CAPS  capsule Take 1 tablet by mouth daily.    . sertraline (ZOLOFT) 50 MG tablet Take 1 tablet (50 mg total) by mouth daily. 90 tablet 2  . loratadine (CLARITIN) 10 MG tablet Take 10 mg by mouth daily.  3  . megestrol (MEGACE) 40 MG/ML suspension Take 10 mLs by mouth daily.  3   No current facility-administered medications for this visit.      Musculoskeletal: Strength & Muscle Tone: within normal limits Gait & Station: normal Patient leans: N/A  Psychiatric Specialty Exam:  Review of Systems  Gastrointestinal: Positive for constipation.  Musculoskeletal: Positive for back pain and joint pain.  All other systems reviewed and are negative.   Blood pressure 125/70, pulse 79, height 5\' 4"  (1.626 m), weight 152 lb (68.9 kg), SpO2 99 %.Body mass index is 26.09 kg/m.  General Appearance: Casual and Fairly Groomed  Eye Contact:  Good  Speech:  Clear and Coherent  Volume:  Normal  Mood:  Euthymic  Affect:  Congruent  Thought Process:  Goal Directed  Orientation:  Full (Time, Place, and Person)  Thought Content: WDL   Suicidal Thoughts:  No  Homicidal Thoughts:  No  Memory:  Immediate;   Good Recent;   Good Remote;   Fair  Judgement:  Fair  Insight:  Fair  Psychomotor Activity:  Decreased  Concentration:  Concentration: Good and Attention Span: Good  Recall:  Good  Fund of Knowledge: Fair  Language: Good  Akathisia:  No  Handed:  Right  AIMS (if indicated): not done  Assets:  Communication Skills Desire for Improvement Resilience Social Support Talents/Skills  ADL's:  Intact  Cognition: WNL  Sleep:  Good   Screenings:   Assessment and Plan: This patient is a 77 year old female with a history of severe depression and anxiety.  Much of this was related to chronic abdominal pain.  Now that she has had a cholecystectomy she is feeling much better.  She will continue Zoloft 50 mg daily for depression and Xanax 0.5 mg only at bedtime for anxiety or sleep.  She will return to see  me in 4 months   [name], MD 02/27/2018, 3:24 PM

## 2018-03-06 DIAGNOSIS — N951 Menopausal and female climacteric states: Secondary | ICD-10-CM | POA: Diagnosis not present

## 2018-03-06 DIAGNOSIS — F419 Anxiety disorder, unspecified: Secondary | ICD-10-CM | POA: Diagnosis not present

## 2018-03-06 DIAGNOSIS — F39 Unspecified mood [affective] disorder: Secondary | ICD-10-CM | POA: Diagnosis not present

## 2018-03-06 DIAGNOSIS — K5900 Constipation, unspecified: Secondary | ICD-10-CM | POA: Diagnosis not present

## 2018-03-06 DIAGNOSIS — Z6825 Body mass index (BMI) 25.0-25.9, adult: Secondary | ICD-10-CM | POA: Diagnosis not present

## 2018-03-14 DIAGNOSIS — H26492 Other secondary cataract, left eye: Secondary | ICD-10-CM | POA: Diagnosis not present

## 2018-03-14 DIAGNOSIS — H02834 Dermatochalasis of left upper eyelid: Secondary | ICD-10-CM | POA: Diagnosis not present

## 2018-03-14 DIAGNOSIS — H26493 Other secondary cataract, bilateral: Secondary | ICD-10-CM | POA: Diagnosis not present

## 2018-03-14 DIAGNOSIS — H18413 Arcus senilis, bilateral: Secondary | ICD-10-CM | POA: Diagnosis not present

## 2018-03-14 DIAGNOSIS — H02831 Dermatochalasis of right upper eyelid: Secondary | ICD-10-CM | POA: Diagnosis not present

## 2018-03-18 DIAGNOSIS — F411 Generalized anxiety disorder: Secondary | ICD-10-CM | POA: Diagnosis not present

## 2018-03-18 DIAGNOSIS — E782 Mixed hyperlipidemia: Secondary | ICD-10-CM | POA: Diagnosis not present

## 2018-03-18 DIAGNOSIS — R7301 Impaired fasting glucose: Secondary | ICD-10-CM | POA: Diagnosis not present

## 2018-03-18 DIAGNOSIS — N183 Chronic kidney disease, stage 3 (moderate): Secondary | ICD-10-CM | POA: Diagnosis not present

## 2018-03-18 DIAGNOSIS — K219 Gastro-esophageal reflux disease without esophagitis: Secondary | ICD-10-CM | POA: Diagnosis not present

## 2018-03-18 DIAGNOSIS — I10 Essential (primary) hypertension: Secondary | ICD-10-CM | POA: Diagnosis not present

## 2018-03-18 DIAGNOSIS — I1310 Hypertensive heart and chronic kidney disease without heart failure, with stage 1 through stage 4 chronic kidney disease, or unspecified chronic kidney disease: Secondary | ICD-10-CM | POA: Diagnosis not present

## 2018-04-02 ENCOUNTER — Ambulatory Visit: Payer: Medicare HMO | Admitting: Orthopedic Surgery

## 2018-04-02 ENCOUNTER — Encounter: Payer: Self-pay | Admitting: Orthopedic Surgery

## 2018-04-02 VITALS — BP 131/74 | HR 96 | Ht 64.0 in | Wt 151.0 lb

## 2018-04-02 DIAGNOSIS — R7303 Prediabetes: Secondary | ICD-10-CM | POA: Diagnosis not present

## 2018-04-02 DIAGNOSIS — R1084 Generalized abdominal pain: Secondary | ICD-10-CM | POA: Diagnosis not present

## 2018-04-02 DIAGNOSIS — F411 Generalized anxiety disorder: Secondary | ICD-10-CM | POA: Diagnosis not present

## 2018-04-02 DIAGNOSIS — M1711 Unilateral primary osteoarthritis, right knee: Secondary | ICD-10-CM | POA: Diagnosis not present

## 2018-04-02 DIAGNOSIS — Z6825 Body mass index (BMI) 25.0-25.9, adult: Secondary | ICD-10-CM | POA: Diagnosis not present

## 2018-04-02 DIAGNOSIS — Z0000 Encounter for general adult medical examination without abnormal findings: Secondary | ICD-10-CM | POA: Diagnosis not present

## 2018-04-02 DIAGNOSIS — R7301 Impaired fasting glucose: Secondary | ICD-10-CM | POA: Diagnosis not present

## 2018-04-02 DIAGNOSIS — F39 Unspecified mood [affective] disorder: Secondary | ICD-10-CM | POA: Diagnosis not present

## 2018-04-02 DIAGNOSIS — M792 Neuralgia and neuritis, unspecified: Secondary | ICD-10-CM | POA: Diagnosis not present

## 2018-04-02 NOTE — Progress Notes (Signed)
Chief Complaint  Patient presents with  . Knee Pain    right     Procedure note right knee injection verbal consent was obtained to inject right knee joint  Timeout was completed to confirm the site of injection  The medications used were 40 mg of Depo-Medrol and 1% lidocaine 3 cc  Anesthesia was provided by ethyl chloride and the skin was prepped with alcohol.  After cleaning the skin with alcohol a 20-gauge needle was used to inject the right knee joint. There were no complications. A sterile bandage was applied.  ... wants to be seen for the right shoulder and neck for pain right arm radiating to right hand  Encounter Diagnosis  Name Primary?  Marland Kitchen Arthritis of knee, right Yes

## 2018-04-07 ENCOUNTER — Ambulatory Visit: Payer: Medicare HMO | Admitting: Orthopedic Surgery

## 2018-04-16 ENCOUNTER — Ambulatory Visit (INDEPENDENT_AMBULATORY_CARE_PROVIDER_SITE_OTHER): Payer: Medicare HMO

## 2018-04-16 ENCOUNTER — Ambulatory Visit: Payer: Medicare HMO | Admitting: Orthopedic Surgery

## 2018-04-16 ENCOUNTER — Encounter: Payer: Self-pay | Admitting: Orthopedic Surgery

## 2018-04-16 VITALS — BP 130/72 | HR 85 | Ht 64.0 in | Wt 148.0 lb

## 2018-04-16 DIAGNOSIS — N951 Menopausal and female climacteric states: Secondary | ICD-10-CM | POA: Diagnosis not present

## 2018-04-16 DIAGNOSIS — I1310 Hypertensive heart and chronic kidney disease without heart failure, with stage 1 through stage 4 chronic kidney disease, or unspecified chronic kidney disease: Secondary | ICD-10-CM | POA: Diagnosis not present

## 2018-04-16 DIAGNOSIS — R1084 Generalized abdominal pain: Secondary | ICD-10-CM | POA: Diagnosis not present

## 2018-04-16 DIAGNOSIS — R110 Nausea: Secondary | ICD-10-CM | POA: Diagnosis not present

## 2018-04-16 DIAGNOSIS — M25511 Pain in right shoulder: Secondary | ICD-10-CM | POA: Diagnosis not present

## 2018-04-16 DIAGNOSIS — M542 Cervicalgia: Secondary | ICD-10-CM

## 2018-04-16 DIAGNOSIS — D510 Vitamin B12 deficiency anemia due to intrinsic factor deficiency: Secondary | ICD-10-CM | POA: Diagnosis not present

## 2018-04-16 DIAGNOSIS — K5900 Constipation, unspecified: Secondary | ICD-10-CM | POA: Diagnosis not present

## 2018-04-16 DIAGNOSIS — R109 Unspecified abdominal pain: Secondary | ICD-10-CM | POA: Diagnosis not present

## 2018-04-16 DIAGNOSIS — E876 Hypokalemia: Secondary | ICD-10-CM | POA: Diagnosis not present

## 2018-04-16 DIAGNOSIS — E559 Vitamin D deficiency, unspecified: Secondary | ICD-10-CM | POA: Diagnosis not present

## 2018-04-16 DIAGNOSIS — R0602 Shortness of breath: Secondary | ICD-10-CM | POA: Diagnosis not present

## 2018-04-16 DIAGNOSIS — D696 Thrombocytopenia, unspecified: Secondary | ICD-10-CM | POA: Diagnosis not present

## 2018-04-16 DIAGNOSIS — E782 Mixed hyperlipidemia: Secondary | ICD-10-CM | POA: Diagnosis not present

## 2018-04-16 DIAGNOSIS — M47812 Spondylosis without myelopathy or radiculopathy, cervical region: Secondary | ICD-10-CM | POA: Diagnosis not present

## 2018-04-16 MED ORDER — PREDNISONE 10 MG PO TABS: 10.0000 mg | ORAL_TABLET | Freq: Every day | ORAL | 1 refills | Status: DC

## 2018-04-16 MED ORDER — CYCLOBENZAPRINE HCL 5 MG PO TABS: 5.0000 mg | ORAL_TABLET | Freq: Three times a day (TID) | ORAL | 1 refills | Status: DC | PRN

## 2018-04-16 NOTE — Progress Notes (Signed)
Progress Note   Patient ID: Audrey Johnson, female   DOB: 1941/01/01, 77 y.o.   MRN: [id number]   Chief Complaint  Patient presents with  . Torticollis    Neck pain for one month.    HPI The patient presents for evaluation of neck pain x4 weeks  Patient reports initial periscapular pain which radiated into the neck and head giving her headache about 3 to 4 weeks ago.  She has not had any treatment.  She has intermittent radicular pain into the right hand but primarily neck and upper trapezius pain.  Pain is severe it wakes her up at night seems to be worse at night   Review of Systems  Constitutional: Negative for chills, fever, malaise/fatigue and weight loss.  Musculoskeletal: Positive for joint pain and neck pain.  Skin: Negative.   Neurological: Positive for sensory change. Negative for weakness.   No outpatient medications have been marked as taking for the 04/16/18 encounter (Office Visit) with Carole Civil, MD.    Past Medical History:  Diagnosis Date  . Anxiety   . Arthritis    in back .   Marland Kitchen Borderline diabetes   . Chronic abdominal pain   . Chronic back pain   . Chronic constipation   . Chronic nausea   . Depression   . GERD (gastroesophageal reflux disease)   . High cholesterol   . Hypertension   . Noncompliance with medication regimen   . Pneumonia   . Poor historian   . Shortness of breath     even walking causes sob...      No Known Allergies   BP 130/72   Pulse 85   Ht 5\' 4"  (1.626 m)   Wt 148 lb (67.1 kg)   BMI 25.40 kg/m    Physical Exam General appearance normal Oriented x3 normal Mood pleasant affect normal Gait cane is views but this is chronic no other gait abnormality  Ortho Exam Cervical spine  Tenderness to palpation on the right and midline of the cervical spine Rotation is altered and restricted as is flexion extension with a negative Spurling sign Increased muscle tension is noted Manual muscle testing  rotation normal Skin is intact  Right and left upper extremity show no weakness.  Range of motion is full no instability muscle tone and strength are excellent skin is warm dry and intact pulses are good temperature is normal and pinprick is normal as well    MEDICAL DECISION MAKING   Imaging:  Office x-ray shows severe spondylosis of the cervical spine   Encounter Diagnoses  Name Primary?  . Neck pain Yes  . Cervical spondylosis without myelopathy      PLAN: (RX., injection, surgery,frx,mri/ct, XR 2 body ares) Recommend medical treatment  Meds ordered this encounter  Medications  . cyclobenzaprine (FLEXERIL) 5 MG tablet    Sig: Take 1 tablet (5 mg total) by mouth 3 (three) times daily as needed for muscle spasms.    Dispense:  30 tablet    Refill:  1  . predniSONE (DELTASONE) 10 MG tablet    Sig: Take 1 tablet (10 mg total) by mouth daily.    Dispense:  30 tablet    Refill:  1   11:37 AM 04/16/2018

## 2018-04-18 ENCOUNTER — Other Ambulatory Visit (HOSPITAL_COMMUNITY): Payer: Self-pay | Admitting: Internal Medicine

## 2018-04-18 DIAGNOSIS — Z1231 Encounter for screening mammogram for malignant neoplasm of breast: Secondary | ICD-10-CM

## 2018-04-24 DIAGNOSIS — R7301 Impaired fasting glucose: Secondary | ICD-10-CM | POA: Diagnosis not present

## 2018-04-24 DIAGNOSIS — F419 Anxiety disorder, unspecified: Secondary | ICD-10-CM | POA: Diagnosis not present

## 2018-04-24 DIAGNOSIS — F39 Unspecified mood [affective] disorder: Secondary | ICD-10-CM | POA: Diagnosis not present

## 2018-04-24 DIAGNOSIS — K219 Gastro-esophageal reflux disease without esophagitis: Secondary | ICD-10-CM | POA: Diagnosis not present

## 2018-04-24 DIAGNOSIS — I10 Essential (primary) hypertension: Secondary | ICD-10-CM | POA: Diagnosis not present

## 2018-04-24 DIAGNOSIS — E782 Mixed hyperlipidemia: Secondary | ICD-10-CM | POA: Diagnosis not present

## 2018-05-07 ENCOUNTER — Ambulatory Visit (HOSPITAL_COMMUNITY)
Admission: RE | Admit: 2018-05-07 | Discharge: 2018-05-07 | Disposition: A | Discharge status: Home / Self Care | Payer: Medicare HMO | Source: Ambulatory Visit | Attending: Internal Medicine | Admitting: Internal Medicine

## 2018-05-07 DIAGNOSIS — Z1231 Encounter for screening mammogram for malignant neoplasm of breast: Secondary | ICD-10-CM | POA: Diagnosis not present

## 2018-05-31 ENCOUNTER — Other Ambulatory Visit: Payer: Self-pay

## 2018-05-31 ENCOUNTER — Encounter (HOSPITAL_COMMUNITY): Payer: Self-pay

## 2018-05-31 ENCOUNTER — Emergency Department (HOSPITAL_COMMUNITY)
Admission: EM | Admit: 2018-05-31 | Discharge: 2018-05-31 | Disposition: A | Discharge status: Home / Self Care | Payer: Medicare HMO | Attending: Emergency Medicine | Admitting: Emergency Medicine

## 2018-05-31 DIAGNOSIS — M5412 Radiculopathy, cervical region: Secondary | ICD-10-CM | POA: Insufficient documentation

## 2018-05-31 DIAGNOSIS — Z7982 Long term (current) use of aspirin: Secondary | ICD-10-CM | POA: Insufficient documentation

## 2018-05-31 DIAGNOSIS — M542 Cervicalgia: Secondary | ICD-10-CM

## 2018-05-31 DIAGNOSIS — I10 Essential (primary) hypertension: Secondary | ICD-10-CM | POA: Insufficient documentation

## 2018-05-31 DIAGNOSIS — Z79899 Other long term (current) drug therapy: Secondary | ICD-10-CM | POA: Diagnosis not present

## 2018-05-31 DIAGNOSIS — M47812 Spondylosis without myelopathy or radiculopathy, cervical region: Secondary | ICD-10-CM

## 2018-05-31 MED ORDER — CYCLOBENZAPRINE HCL 5 MG PO TABS: 5.0000 mg | ORAL_TABLET | Freq: Three times a day (TID) | ORAL | 1 refills | Status: DC | PRN

## 2018-05-31 MED ORDER — KETOROLAC TROMETHAMINE 30 MG/ML IJ SOLN
15.0000 mg | Freq: Once | INTRAMUSCULAR | Status: AC
  Administered 2018-05-31: 15 mg via INTRAMUSCULAR
  Filled 2018-05-31: qty 1

## 2018-05-31 MED ORDER — DEXAMETHASONE SODIUM PHOSPHATE 10 MG/ML IJ SOLN
10.0000 mg | Freq: Once | INTRAMUSCULAR | Status: AC
  Administered 2018-05-31: 10 mg via INTRAMUSCULAR
  Filled 2018-05-31: qty 1

## 2018-05-31 MED ORDER — HYDROCODONE-ACETAMINOPHEN 5-325 MG PO TABS: 1.0000 | ORAL_TABLET | ORAL | 0 refills | Status: DC | PRN

## 2018-05-31 NOTE — ED Triage Notes (Signed)
Pt reports cervical neck pain for several months. Pt has seen Dr Aline Brochure several times and had xray of C-spine, results showed severe spondylosis without myelopathy. Pt is out of flexeril, but still taking meloxicam 15 mg qd.

## 2018-05-31 NOTE — ED Provider Notes (Signed)
[facility] EMERGENCY DEPARTMENT Provider Note   CSN: [id number] Arrival date & time: 05/31/18  2252     History   Chief Complaint Chief Complaint  Patient presents with  . Neck Pain    HPI Audrey Johnson is a 77 y.o. female.  Patient presents to the emergency department for evaluation of right-sided neck pain.  Patient reports that this has been ongoing for more than a month.  She has seen her orthopedic surgeon, Dr. Aline Brochure for this.  Patient reports that she has run out of her Flexeril, has been taking the Mobic.  She continues to have intermittent episodes of pain, worse at night.  She has not had any new injury.  Pain starts on the right side of the base of the neck and radiates up to the base of the skull and into the shoulder and arm to the elbow.  She does not have any weakness or loss of sensation.     Past Medical History:  Diagnosis Date  . Anxiety   . Arthritis    in back .   Marland Kitchen Borderline diabetes   . Chronic abdominal pain   . Chronic back pain   . Chronic constipation   . Chronic nausea   . Depression   . GERD (gastroesophageal reflux disease)   . High cholesterol   . Hypertension   . Noncompliance with medication regimen   . Pneumonia   . Poor historian   . Shortness of breath     even walking causes sob...     Patient Active Problem List   Diagnosis Date Noted  . Chronic cholecystitis 02/04/2018  . Biliary dyskinesia 01/21/2018  . Urinary tract infection symptoms 03/11/2017  . GERD (gastroesophageal reflux disease) 07/20/2013  . Abnormal LFTs 07/20/2013  . Constipation 07/20/2013  . Depression 04/24/2013  . Chronic abdominal pain 12/04/2012  . Dyspnea 05/19/2012  . Benign hypertension 05/19/2012  . Hyperlipidemia 05/19/2012    Past Surgical History:  Procedure Laterality Date  . ABDOMINAL HYSTERECTOMY    . BREAST SURGERY  unknown (many yrs)   excision  of lump right breast  was per pt "nothing"   . CARDIAC CATHETERIZATION    .  CATARACT EXTRACTION W/PHACO Left 02/21/2015   Procedure: CATARACT EXTRACTION PHACO AND INTRAOCULAR LENS PLACEMENT LEFT EYE CDE=5.86;  Surgeon: Tonny Branch, MD;  Location: [facility];  Service: Ophthalmology;  Laterality: Left;  . CATARACT EXTRACTION W/PHACO Right 03/14/2015   Procedure: CATARACT EXTRACTION PHACO AND INTRAOCULAR LENS PLACEMENT ([place]);  Surgeon: Tonny Branch, MD;  Location: [facility];  Service: Ophthalmology;  Laterality: Right;  CDE:6.36  . CHOLECYSTECTOMY N/A 01/24/2018   Procedure: LAPAROSCOPIC CHOLECYSTECTOMY;  Surgeon: Virl Cagey, MD;  Location: [facility];  Service: General;  Laterality: N/A;  . COLONOSCOPY  04/16/2007   RMR: Normal rectum and colon  . COLONOSCOPY WITH ESOPHAGOGASTRODUODENOSCOPY (EGD) N/A 01/08/2013   YSA:YTKZ erosive reflux esophagitis. Negative H.pylori. Small hiatal hernia-s/p bx (gastritis)-TCS/Colonic polyp-hyperplastic  . DILATION AND CURETTAGE OF UTERUS    . ESOPHAGOGASTRODUODENOSCOPY (EGD) WITH PROPOFOL N/A 02/14/2017   DR. Rourk: normal esophagus, small hiatal hernia  . LEFT HEART CATHETERIZATION WITH CORONARY ANGIOGRAM N/A 06/05/2012   Procedure: LEFT HEART CATHETERIZATION WITH CORONARY ANGIOGRAM;  Surgeon: Hillary Bow, MD;  Location: [facility] CATH LAB;  Service: Cardiovascular;  Laterality: N/A;  . TOE SURGERY Right    5th toe, not sure what was wrong with it     OB History   None  Home Medications    Prior to Admission medications   Medication Sig Start Date End Date Taking? Authorizing Provider  meloxicam (MOBIC) 15 MG tablet Take 15 mg by mouth daily.   Yes [provider]  ALPRAZolam (XANAX) 0.5 MG tablet Take 1 tablet (0.5 mg total) by mouth at bedtime. 02/27/18 02/27/19  Cloria Spring, MD  aspirin 81 MG tablet Take 2 tablets by mouth daily as needed for pain.  12/30/17   [provider]  atorvastatin (LIPITOR) 20 MG tablet Take 20 mg by mouth at bedtime.    Celene Squibb, MD  BYSTOLIC 5 MG tablet Take 5 mg by mouth daily.   09/15/17   [provider]  calcium carbonate (TUMS - DOSED IN MG ELEMENTAL CALCIUM) 500 MG chewable tablet Chew 1 tablet by mouth as needed for indigestion or heartburn.    [provider]  cyclobenzaprine (FLEXERIL) 5 MG tablet Take 1 tablet (5 mg total) by mouth 3 (three) times daily as needed for muscle spasms. 05/31/18   Orpah Greek, MD  diclofenac sodium (VOLTAREN) 1 % GEL Apply 1 application topically as needed. 12/12/17   [provider]  docusate sodium (COLACE) 50 MG capsule Take 50 mg by mouth 2 (two) times daily.    [provider]  gabapentin (NEURONTIN) 100 MG capsule Take 100 mg by mouth 3 (three) times daily.    [provider]  HYDROcodone-acetaminophen (NORCO/VICODIN) 5-325 MG tablet Take 1-2 tablets by mouth every 4 (four) hours as needed. 05/31/18   Orpah Greek, MD  lisinopril (PRINIVIL,ZESTRIL) 10 MG tablet Take 10 mg by mouth daily.    [provider]  loratadine (CLARITIN) 10 MG tablet Take 10 mg by mouth daily. 11/13/17   [provider]  megestrol (MEGACE) 40 MG/ML suspension Take 10 mLs by mouth daily. 10/14/17   [provider]  omega-3 fish oil (MAXEPA) 1000 MG CAPS capsule Take 1 tablet by mouth daily. 12/30/17   [provider]  predniSONE (DELTASONE) 10 MG tablet Take 1 tablet (10 mg total) by mouth daily. 04/16/18   Carole Civil, MD  sertraline (ZOLOFT) 50 MG tablet Take 1 tablet (50 mg total) by mouth daily. 02/27/18 02/27/19  Cloria Spring, MD    Family History Family History  Problem Relation Age of Onset  . Lung disease Mother   . Diabetes Mother   . Cancer Father   . Heart disease Father   . Heart disease Sister   . Stroke Sister   . Cancer Brother   . Colon cancer Neg Hx     Social History Social History   Tobacco Use  . Smoking status: Never Smoker  . Smokeless tobacco: Never Used  . Tobacco comment: Never smoked  Substance Use Topics  .  Alcohol use: No  . Drug use: No     Allergies   Patient has no known allergies.   Review of Systems Review of Systems  Musculoskeletal: Positive for neck pain.  All other systems reviewed and are negative.    Physical Exam Updated Vital Signs BP (!) 158/98 (BP Location: Right Arm)   Pulse (!) 113   Temp 98.8 F (37.1 C) (Oral)   Resp 20   Ht 5\' 4"  (1.626 m)   Wt 67.6 kg   SpO2 99%   BMI 25.58 kg/m   Physical Exam  Constitutional: She is oriented to person, place, and time. She appears well-developed and well-nourished. No distress.  HENT:  Head: Normocephalic and atraumatic.  Right Ear: Hearing normal.  Left Ear: Hearing normal.  Nose: Nose normal.  Mouth/Throat: Oropharynx is clear and moist and mucous membranes are normal.  Eyes: Pupils are equal, round, and reactive to light. Conjunctivae and EOM are normal.  Neck: Neck supple. Muscular tenderness present. Decreased range of motion present.    Cardiovascular: Regular rhythm, S1 normal and S2 normal. Exam reveals no gallop and no friction rub.  No murmur heard. Pulmonary/Chest: Effort normal and breath sounds normal. No respiratory distress. She exhibits no tenderness.  Abdominal: Soft. Normal appearance and bowel sounds are normal. There is no hepatosplenomegaly. There is no tenderness. There is no rebound, no guarding, no tenderness at McBurney's point and negative Murphy's sign. No hernia.  Neurological: She is alert and oriented to person, place, and time. She has normal strength. No cranial nerve deficit or sensory deficit. Coordination normal. GCS eye subscore is 4. GCS verbal subscore is 5. GCS motor subscore is 6.  Equal grip strength bilateral upper extremities, normal sensation to light touch  Skin: Skin is warm, dry and intact. No rash noted. No cyanosis.  Psychiatric: She has a normal mood and affect. Her speech is normal and behavior is normal. Thought content normal.  Nursing note and vitals  reviewed.    ED Treatments / Results  Labs (all labs ordered are listed, but only abnormal results are displayed) Labs Reviewed - No data to display  EKG None  Radiology No results found.  Procedures Procedures (including critical care time)  Medications Ordered in ED Medications  dexamethasone (DECADRON) injection 10 mg (has no administration in time range)  ketorolac (TORADOL) 30 MG/ML injection 15 mg (has no administration in time range)     Initial Impression / Assessment and Plan / ED Course  I have reviewed the triage vital signs and the nursing notes.  Pertinent labs & imaging results that were available during my care of the patient were reviewed by me and considered in my medical decision making (see chart for details).     Presents with continued neck pain with radiation to the right upper arm.  This has been ongoing for some time.  Notes from her visit to her orthopedist were reviewed.  Current presentation is similar.  Patient has run out of some of her medications.  She also is experiencing intermittent episodes of anxiety.  Her primary doctor has weaned her off of Xanax 3 times a day down to 1 tablet a day and this is likely contributory.  Patient appears anxious currently and is also having pain.  She is not expensing any shortness of breath, chest pain.  Neurologic examination of upper extremities is normal, no deficit noted.  Patient will require further work-up as an outpatient for this chronic neck pain, referred back to Dr. Aline Brochure.  Patient's anxiety will need to be reevaluated by her primary care physician Dr. Nevada Crane.  Will provide analgesia tonight to get her through the weekend.  Final Clinical Impressions(s) / ED Diagnoses   Final diagnoses:  Cervical radiculopathy    ED Discharge Orders         Ordered    cyclobenzaprine (FLEXERIL) 5 MG tablet  3 times daily PRN     05/31/18 2322    HYDROcodone-acetaminophen (NORCO/VICODIN) 5-325 MG tablet  Every 4  hours PRN     05/31/18 2322           Orpah Greek, MD 05/31/18 2322

## 2018-06-02 ENCOUNTER — Other Ambulatory Visit: Payer: Self-pay | Admitting: Orthopedic Surgery

## 2018-06-02 DIAGNOSIS — M1711 Unilateral primary osteoarthritis, right knee: Secondary | ICD-10-CM

## 2018-06-02 MED FILL — Hydrocodone-Acetaminophen Tab 5-325 MG: ORAL | Qty: 6 | Status: AC

## 2018-06-11 ENCOUNTER — Ambulatory Visit: Payer: Medicare HMO | Admitting: Orthopedic Surgery

## 2018-06-11 ENCOUNTER — Encounter: Payer: Self-pay | Admitting: Orthopedic Surgery

## 2018-06-11 VITALS — BP 138/79 | HR 99 | Ht 64.0 in | Wt 146.0 lb

## 2018-06-11 DIAGNOSIS — M4722 Other spondylosis with radiculopathy, cervical region: Secondary | ICD-10-CM

## 2018-06-11 DIAGNOSIS — M542 Cervicalgia: Secondary | ICD-10-CM

## 2018-06-11 MED ORDER — TIZANIDINE HCL 4 MG PO TABS: 4.0000 mg | ORAL_TABLET | Freq: Four times a day (QID) | ORAL | 0 refills | Status: DC | PRN

## 2018-06-11 NOTE — Progress Notes (Signed)
Chief Complaint  Patient presents with  . Neck Pain    down right arm, can not sleep due to pain     77 year old African-American female first seen in September 2019 diagnosed with cervical spondylosis treated nonoperatively with oral medication Flexeril which she was not able to obtain secondary to insurance restrictions.  Her pain started back in September she had not had any treatment when I saw her she had neck pain radiating into the right arm and hand with severe pain including night pain.  I placed her on prednisone and Flexeril.  She did not improve and went to the ER they gave her IM Decadron hydrocodone and Toradol and advised Flexeril as well but she was unable to obtain  Review of systems she Reports clumsiness with the right upper extremity radiating pain down her arm  No gait disturbances.  Past Medical History:  Diagnosis Date  . Anxiety   . Arthritis    in back .   Marland Kitchen Borderline diabetes   . Chronic abdominal pain   . Chronic back pain   . Chronic constipation   . Chronic nausea   . Depression   . GERD (gastroesophageal reflux disease)   . High cholesterol   . Hypertension   . Noncompliance with medication regimen   . Pneumonia   . Poor historian   . Shortness of breath     even walking causes sob...    Past Surgical History:  Procedure Laterality Date  . ABDOMINAL HYSTERECTOMY    . BREAST SURGERY  unknown (many yrs)   excision  of lump right breast  was per pt "nothing"   . CARDIAC CATHETERIZATION    . CATARACT EXTRACTION W/PHACO Left 02/21/2015   Procedure: CATARACT EXTRACTION PHACO AND INTRAOCULAR LENS PLACEMENT LEFT EYE CDE=5.86;  Surgeon: Tonny Branch, MD;  Location: [facility];  Service: Ophthalmology;  Laterality: Left;  . CATARACT EXTRACTION W/PHACO Right 03/14/2015   Procedure: CATARACT EXTRACTION PHACO AND INTRAOCULAR LENS PLACEMENT ([place]);  Surgeon: Tonny Branch, MD;  Location: [facility];  Service: Ophthalmology;  Laterality: Right;  CDE:6.36  .  CHOLECYSTECTOMY N/A 01/24/2018   Procedure: LAPAROSCOPIC CHOLECYSTECTOMY;  Surgeon: Virl Cagey, MD;  Location: [facility];  Service: General;  Laterality: N/A;  . COLONOSCOPY  04/16/2007   RMR: Normal rectum and colon  . COLONOSCOPY WITH ESOPHAGOGASTRODUODENOSCOPY (EGD) N/A 01/08/2013   RFF:MBWG erosive reflux esophagitis. Negative H.pylori. Small hiatal hernia-s/p bx (gastritis)-TCS/Colonic polyp-hyperplastic  . DILATION AND CURETTAGE OF UTERUS    . ESOPHAGOGASTRODUODENOSCOPY (EGD) WITH PROPOFOL N/A 02/14/2017   DR. Rourk: normal esophagus, small hiatal hernia  . LEFT HEART CATHETERIZATION WITH CORONARY ANGIOGRAM N/A 06/05/2012   Procedure: LEFT HEART CATHETERIZATION WITH CORONARY ANGIOGRAM;  Surgeon: Hillary Bow, MD;  Location: [facility] CATH LAB;  Service: Cardiovascular;  Laterality: N/A;  . TOE SURGERY Right    5th toe, not sure what was wrong with it    Physical Exam  Constitutional: She is oriented to person, place, and time. She appears well-developed and well-nourished.  Neurological: She is alert and oriented to person, place, and time.  Psychiatric: She has a normal mood and affect. Judgment normal.  Vitals reviewed.  Pain and tenderness in the cervical spine with increased muscle tension muscle spasm in her trapezius muscles decreased range of motion in all planes skin normal  Upper extremities exhibit right upper extremity grip strength weakness compared to left Normal range of motion in all joints No subluxation dislocation shoulder elbow wrist or hand right  or left upper extremity Skin both arms normal  Reflexes are 2+ at the elbow and wrist bilaterally without tremor  Sensory loss C6 right upper extremity compared to left   Previous x-rays show severe spondylosis starting C 3 through T1 loss of cervical lordosis    Encounter Diagnoses  Name Primary?  . Cervicalgia   . Cervical spondylosis with radiculopathy Yes   Meds ordered this encounter  Medications  .  tiZANidine (ZANAFLEX) 4 MG tablet    Sig: Take 1 tablet (4 mg total) by mouth every 6 (six) hours as needed for muscle spasms.    Dispense:  30 tablet    Refill:  0   Recommend MRI cervical spine follow-up after MRI start Zanaflex as prescribed

## 2018-06-12 ENCOUNTER — Telehealth: Payer: Self-pay | Admitting: Radiology

## 2018-06-12 NOTE — Telephone Encounter (Signed)
I called patient about MRI scan and made her a follow up appointment

## 2018-06-13 ENCOUNTER — Ambulatory Visit (HOSPITAL_COMMUNITY)
Admission: RE | Admit: 2018-06-13 | Discharge: 2018-06-13 | Disposition: A | Discharge status: Home / Self Care | Payer: Medicare HMO | Source: Ambulatory Visit | Attending: Orthopedic Surgery | Admitting: Orthopedic Surgery

## 2018-06-13 DIAGNOSIS — M542 Cervicalgia: Secondary | ICD-10-CM | POA: Insufficient documentation

## 2018-06-13 DIAGNOSIS — M4802 Spinal stenosis, cervical region: Secondary | ICD-10-CM | POA: Diagnosis not present

## 2018-06-13 DIAGNOSIS — M47812 Spondylosis without myelopathy or radiculopathy, cervical region: Secondary | ICD-10-CM | POA: Insufficient documentation

## 2018-06-18 ENCOUNTER — Encounter (HOSPITAL_COMMUNITY): Payer: Self-pay | Admitting: Psychiatry

## 2018-06-18 ENCOUNTER — Ambulatory Visit (INDEPENDENT_AMBULATORY_CARE_PROVIDER_SITE_OTHER): Payer: Medicare HMO | Admitting: Psychiatry

## 2018-06-18 VITALS — BP 144/79 | HR 95 | Ht 64.0 in | Wt 146.6 lb

## 2018-06-18 DIAGNOSIS — F321 Major depressive disorder, single episode, moderate: Secondary | ICD-10-CM | POA: Diagnosis not present

## 2018-06-18 MED ORDER — SERTRALINE HCL 50 MG PO TABS: 75.0000 mg | ORAL_TABLET | Freq: Every day | ORAL | 2 refills | Status: DC

## 2018-06-18 MED ORDER — GABAPENTIN 100 MG PO CAPS: 100.0000 mg | ORAL_CAPSULE | Freq: Three times a day (TID) | ORAL | 2 refills | Status: DC

## 2018-06-18 NOTE — Progress Notes (Signed)
[facility] MD/PA/NP OP Progress Note  06/18/2018 11:07 AM Audrey Johnson  MRN:  [id number]  Chief Complaint:  Chief Complaint    Depression; Anxiety; Follow-up     HPI: This patient is a 77 year old widowed black female who lives alone in [place].  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  The patient returns for follow-up for depression and anxiety.  A few years ago she got depressed and very delusional she was hospitalized for a brief time and since then has done well as an outpatient.  The patient returns after 3 months.  She states that she is totally gotten off Xanax.  She states that the pharmacist employed by Dr. Nevada Crane her primary doctor encouraged her to get off of it.  I actually called Dr. Nevada Crane and he explained that the pharmacist is supposed to talk with the provider prescribing the medicine before things are changed.  Nevertheless the patient does not want to go back on Xanax.  She is claiming that she is having a lot of "hot flashes" which to be sound more like anxiety.  I suggested that we increase her Zoloft and she continue Neurontin for neuropathic pain and anxiety.  She just had an MRI of her cervical spine and it does show the cervical spondylosis is pretty severe.  I am thinking she is probably going to be referred to neurosurgery Visit Diagnosis:    ICD-10-CM   1. Major depressive disorder, single episode, moderate (HCC) F32.1     Past Psychiatric History: Prior hospitalization for psychotic depression  Past Medical History:  Past Medical History:  Diagnosis Date  . Anxiety   . Arthritis    in back .   Marland Kitchen Borderline diabetes   . Chronic abdominal pain   . Chronic back pain   . Chronic constipation   . Chronic nausea   . Depression   . GERD (gastroesophageal reflux disease)   . High cholesterol   . Hypertension   . Noncompliance with medication regimen   . Pneumonia   . Poor historian   . Shortness of breath     even walking causes  sob...     Past Surgical History:  Procedure Laterality Date  . ABDOMINAL HYSTERECTOMY    . BREAST SURGERY  unknown (many yrs)   excision  of lump right breast  was per pt "nothing"   . CARDIAC CATHETERIZATION    . CATARACT EXTRACTION W/PHACO Left 02/21/2015   Procedure: CATARACT EXTRACTION PHACO AND INTRAOCULAR LENS PLACEMENT LEFT EYE CDE=5.86;  Surgeon: Tonny Branch, MD;  Location: [facility];  Service: Ophthalmology;  Laterality: Left;  . CATARACT EXTRACTION W/PHACO Right 03/14/2015   Procedure: CATARACT EXTRACTION PHACO AND INTRAOCULAR LENS PLACEMENT ([place]);  Surgeon: Tonny Branch, MD;  Location: [facility];  Service: Ophthalmology;  Laterality: Right;  CDE:6.36  . CHOLECYSTECTOMY N/A 01/24/2018   Procedure: LAPAROSCOPIC CHOLECYSTECTOMY;  Surgeon: Virl Cagey, MD;  Location: [facility];  Service: General;  Laterality: N/A;  . COLONOSCOPY  04/16/2007   RMR: Normal rectum and colon  . COLONOSCOPY WITH ESOPHAGOGASTRODUODENOSCOPY (EGD) N/A 01/08/2013   EQA:[facility]H erosive reflux esophagitis. Negative H.pylori. Small hiatal hernia-s/p bx (gastritis)-TCS/Colonic polyp-hyperplastic  . DILATION AND CURETTAGE OF UTERUS    . ESOPHAGOGASTRODUODENOSCOPY (EGD) WITH PROPOFOL N/A 02/14/2017   DR. Rourk: normal esophagus, small hiatal hernia  . LEFT HEART CATHETERIZATION WITH CORONARY ANGIOGRAM N/A 06/05/2012   Procedure: LEFT HEART CATHETERIZATION WITH CORONARY ANGIOGRAM;  Surgeon: Hillary Bow, MD;  Location:  [facility]ATH [facility];  Service: Cardiovascular;  Laterality: N/A;  . TOE SURGERY Right    5th toe, not sure what was wrong with it    Family Psychiatric History: none  Family History:  Family History  Problem Relation Age of Onset  . Lung disease Mother   . Diabetes Mother   . Cancer Father   . Heart disease Father   . Heart disease Sister   . Stroke Sister   . Cancer Brother   . Colon cancer Neg Hx     Social History:  Social History   Socioeconomic History  . Marital status: Widowed    Spouse  name: Not on file  . Number of children: Not on file  . Years of education: Not on file  . Highest education level: Not on file  Occupational History  . Not on file  Social Needs  . Financial resource strain: Not on file  . Food insecurity:    Worry: Not on file    Inability: Not on file  . Transportation needs:    Medical: Not on file    Non-medical: Not on file  Tobacco Use  . Smoking status: Never Smoker  . Smokeless tobacco: Never Used  . Tobacco comment: Never smoked  Substance and Sexual Activity  . Alcohol use: No  . Drug use: No  . Sexual activity: Never    Birth control/protection: Surgical  Lifestyle  . Physical activity:    Days per week: Not on file    Minutes per session: Not on file  . Stress: Not on file  Relationships  . Social connections:    Talks on phone: Not on file    Gets together: Not on file    Attends religious service: Not on file    Active member of club or organization: Not on file    Attends meetings of clubs or organizations: Not on file    Relationship status: Not on file  Other Topics Concern  . Not on file  Social History Narrative  . Not on file    Allergies: No Known Allergies  Metabolic Disorder Labs: No results found for: HGBA1C, MPG No results found for: PROLACTIN No results found for: CHOL, TRIG, HDL, CHOLHDL, VLDL, LDLCALC Lab Results  Component Value Date   TSH 2.821 07/17/2010    Therapeutic Level Labs: No results found for: LITHIUM No results found for: VALPROATE No components found for:  CBMZ  Current Medications: Current Outpatient Medications  Medication Sig Dispense Refill  . aspirin 81 MG tablet Take 2 tablets by mouth daily as needed for pain.     Marland Kitchen atorvastatin (LIPITOR) 20 MG tablet Take 20 mg by mouth at bedtime.    Marland Kitchen BYSTOLIC 5 MG tablet Take 5 mg by mouth daily.     . calcium carbonate (TUMS - DOSED IN MG ELEMENTAL CALCIUM) 500 MG chewable tablet Chew 1 tablet by mouth as needed for indigestion or  heartburn.    . diclofenac sodium (VOLTAREN) 1 % GEL Apply 1 application topically as needed.  3  . docusate sodium (COLACE) 50 MG capsule Take 50 mg by mouth 2 (two) times daily.    Marland Kitchen gabapentin (NEURONTIN) 100 MG capsule Take 1 capsule (100 mg total) by mouth 3 (three) times daily. 270 capsule 2  . lisinopril (PRINIVIL,ZESTRIL) 10 MG tablet Take 10 mg by mouth daily.    Marland Kitchen loratadine (CLARITIN) 10 MG tablet Take 10 mg by mouth daily.  3  . megestrol (MEGACE)  40 MG/ML suspension Take 10 mLs by mouth daily.  3  . meloxicam (MOBIC) 15 MG tablet Take 15 mg by mouth daily.    . sertraline (ZOLOFT) 50 MG tablet Take 1.5 tablets (75 mg total) by mouth daily. 135 tablet 2  . tiZANidine (ZANAFLEX) 4 MG tablet Take 1 tablet (4 mg total) by mouth every 6 (six) hours as needed for muscle spasms. 30 tablet 0   No current facility-administered medications for this visit.      Musculoskeletal: Strength & Muscle Tone: within normal limits Gait & Station: normal Patient leans: N/A  Psychiatric Specialty Exam: Review of Systems  Neurological: Positive for tingling and focal weakness.  Psychiatric/Behavioral: The patient is nervous/anxious.   All other systems reviewed and are negative.   Blood pressure (!) 144/79, pulse 95, height 5\' 4"  (1.626 m), weight 146 lb 9.6 oz (66.5 kg), SpO2 100 %.Body mass index is 25.16 kg/m.  General Appearance: Casual and Fairly Groomed  Eye Contact:  Good  Speech:  Clear and Coherent  Volume:  Normal  Mood:  Anxious  Affect:  Appropriate and Congruent  Thought Process:  Goal Directed  Orientation:  Full (Time, Place, and Person)  Thought Content: Rumination   Suicidal Thoughts:  No  Homicidal Thoughts:  No  Memory:  Immediate;   Good Recent;   Good Remote;   Fair  Judgement:  Fair  Insight:  Fair  Psychomotor Activity:  Normal  Concentration:  Concentration: Fair and Attention Span: Fair  Recall:  [employer] of Knowledge: Fair  Language: Good  Akathisia:   No  Handed:  Right  AIMS (if indicated): not done  Assets:  Communication Skills Desire for Improvement Resilience Social Support Talents/Skills  ADL's:  Intact  Cognition: WNL  Sleep:  Fair   Screenings:   Assessment and Plan: This patient is a 77 year old female with a history of depression and anxiety.  She is now off Xanax which was done without my knowledge but she no longer wants to stay on it.  She still seems anxious and I think the hot flashes are part of this.  She will increase Zoloft to 75 mg daily and continue gabapentin and be sure to take it 100 mg 3 times daily.  She will return to see me in 3 months   [name], MD 06/18/2018, 11:07 AM

## 2018-06-20 ENCOUNTER — Encounter: Payer: Self-pay | Admitting: Orthopedic Surgery

## 2018-06-20 ENCOUNTER — Ambulatory Visit (INDEPENDENT_AMBULATORY_CARE_PROVIDER_SITE_OTHER): Payer: Medicare HMO | Admitting: Orthopedic Surgery

## 2018-06-20 VITALS — BP 162/73 | HR 100 | Ht 64.0 in | Wt 146.0 lb

## 2018-06-20 DIAGNOSIS — M4802 Spinal stenosis, cervical region: Secondary | ICD-10-CM

## 2018-06-20 MED ORDER — ACETAMINOPHEN-CODEINE #3 300-30 MG PO TABS: 1.0000 | ORAL_TABLET | Freq: Four times a day (QID) | ORAL | 0 refills | Status: AC | PRN

## 2018-06-20 NOTE — Progress Notes (Signed)
Chief Complaint  Patient presents with  . Results    review MRI  . Neck Pain    severe right arm pain, can't get in a comfortable position     Follow-up visit this is a 77 year old female who has neck and arm pain with radicular symptoms who do not respond with relief with tizanidine meloxicam and gabapentin  We finally sent her for MRI and she has extensive cervical spondylosis and spinal stenosis with continued symptoms  Review of Systems  Constitutional: Negative for chills, fever, malaise/fatigue and weight loss.  Neurological: Positive for tingling and sensory change.     MRI was evaluated independent interpretation is that she has extensive cervical spondylosis starting in C3-C7.  Report was read as follows  Disc levels:   Foramen magnum is widely patent. Ordinary osteoarthritis at the C1-2 articulation but no encroachment upon the neural structures.   C2-3: Normal interspace.   C3-4: Advanced spondylosis with endplate osteophytes and bulging of the disc. Spinal stenosis with effacement of the subarachnoid space and slight flattening of the cord. AP diameter of the canal in the midline only 6.8 mm. Bilateral foraminal stenosis that could affect either C4 nerve.   C4-5: Advanced spondylosis with endplate osteophytes and bulging of the disc. Spinal stenosis with effacement of the subarachnoid space and slight flattening of the cord. AP diameter of the canal in the midline only 6.5 mm. Bilateral foraminal stenosis that could affect either C5 nerve.   C5-6: Spondylosis with endplate osteophytes and bulging of the disc. Narrowing of the ventral subarachnoid space but no deformity of the cord. AP diameter of the canal in the midline 8.4 mm. Bilateral foraminal stenosis that could affect either C6 nerve.   C6-7: Spondylosis with endplate osteophytes and bulging of the disc. Narrowing of the ventral subarachnoid space but no compression of the cord. AP diameter of the  canal in the midline 9.4 mm. Foraminal narrowing right more than left. Right C7 nerve compression could occur.   C7-T1: Spondylosis with endplate osteophytes and bulging of the disc. Narrowing of the ventral subarachnoid space but no compression of the cord. AP diameter of the canal in the midline 1 cm. Bilateral foraminal encroachment by osteophyte in disc material could compress either or both C8 nerves.   T1-2: Normal interspace.   IMPRESSION: Degenerative spondylosis throughout the cervical spine as outlined above. Central canal stenosis at C3-4 and C4-5 with some cord flattening. No cord edema is seen at this time.   Foraminal narrowing that could cause neural compression bilaterally at C3-4, C4-5 and C5-6, on the right at C6-7 and bilaterally at C7-T1.     Electronically Signed   By: Nelson Chimes M.D.   On: 06/13/2018 09:50  Encounter Diagnosis  Name Primary?  . Spinal stenosis in cervical region Yes   Meds ordered this encounter  Medications  . acetaminophen-codeine (TYLENOL #3) 300-30 MG tablet    Sig: Take 1 tablet by mouth every 6 (six) hours as needed for up to 5 days for moderate pain.    Dispense:  30 tablet    Refill:  0   I recommend she see neurosurgery regarding this condition there is no other nonoperative treatment I can offer I did refill her tizanidine meloxicam and added Tylenol 3 1 every 6 for pain  Follow-up referral to neurosurgery

## 2018-06-23 ENCOUNTER — Other Ambulatory Visit: Payer: Self-pay | Admitting: Orthopedic Surgery

## 2018-06-30 ENCOUNTER — Ambulatory Visit (HOSPITAL_COMMUNITY): Payer: Self-pay | Admitting: Psychiatry

## 2018-07-08 DIAGNOSIS — M4722 Other spondylosis with radiculopathy, cervical region: Secondary | ICD-10-CM | POA: Diagnosis not present

## 2018-07-08 DIAGNOSIS — M4712 Other spondylosis with myelopathy, cervical region: Secondary | ICD-10-CM | POA: Diagnosis not present

## 2018-07-08 DIAGNOSIS — I10 Essential (primary) hypertension: Secondary | ICD-10-CM | POA: Diagnosis not present

## 2018-07-08 DIAGNOSIS — M5030 Other cervical disc degeneration, unspecified cervical region: Secondary | ICD-10-CM | POA: Diagnosis not present

## 2018-07-08 DIAGNOSIS — M4802 Spinal stenosis, cervical region: Secondary | ICD-10-CM | POA: Diagnosis not present

## 2018-07-08 DIAGNOSIS — Z6825 Body mass index (BMI) 25.0-25.9, adult: Secondary | ICD-10-CM | POA: Diagnosis not present

## 2018-07-08 DIAGNOSIS — M542 Cervicalgia: Secondary | ICD-10-CM | POA: Diagnosis not present

## 2018-07-10 DIAGNOSIS — Z961 Presence of intraocular lens: Secondary | ICD-10-CM | POA: Diagnosis not present

## 2018-07-10 DIAGNOSIS — H26491 Other secondary cataract, right eye: Secondary | ICD-10-CM | POA: Diagnosis not present

## 2018-07-10 DIAGNOSIS — H5210 Myopia, unspecified eye: Secondary | ICD-10-CM | POA: Diagnosis not present

## 2018-07-10 DIAGNOSIS — Z0100 Encounter for examination of eyes and vision without abnormal findings: Secondary | ICD-10-CM | POA: Diagnosis not present

## 2018-07-10 DIAGNOSIS — H04123 Dry eye syndrome of bilateral lacrimal glands: Secondary | ICD-10-CM | POA: Diagnosis not present

## 2018-07-16 ENCOUNTER — Telehealth: Payer: Self-pay | Admitting: Orthopedic Surgery

## 2018-07-16 NOTE — Telephone Encounter (Signed)
Patient called to ask if she can speak with Dr Ruthe Mannan nurse or assistant; said she saw Dr Sherwood Gambler, and was told she could not have surgery due to some risks. Said did not understand what all was discussed* *Report has just been received, and in Dr [employer] box.  Please advise. [phone]

## 2018-07-17 NOTE — Telephone Encounter (Signed)
Dr Sherwood Gambler states in his note surgery would be extensive and may not improve her pain or function, and patient is not interested in surgery, so she should follow up with Dr Nevada Crane for non operative management of her pain, I will call her and discuss.

## 2018-07-17 NOTE — Telephone Encounter (Signed)
yes

## 2018-07-17 NOTE — Telephone Encounter (Signed)
I have called her to advise.  

## 2018-07-17 NOTE — Telephone Encounter (Signed)
She is asking if she can drive  Degenerative spondylosis throughout the cervical spine as outlined above. Central canal stenosis at C3-4 and C4-5 with some cord flattening. No cord edema is seen at this time.  Foraminal narrowing that could cause neural compression bilaterally at C3-4, C4-5 and C5-6, on the right at C6-7 and bilaterally at C7-T1.

## 2018-07-18 DIAGNOSIS — K219 Gastro-esophageal reflux disease without esophagitis: Secondary | ICD-10-CM | POA: Diagnosis not present

## 2018-07-18 DIAGNOSIS — R7301 Impaired fasting glucose: Secondary | ICD-10-CM | POA: Diagnosis not present

## 2018-07-18 DIAGNOSIS — I10 Essential (primary) hypertension: Secondary | ICD-10-CM | POA: Diagnosis not present

## 2018-07-18 DIAGNOSIS — E782 Mixed hyperlipidemia: Secondary | ICD-10-CM | POA: Diagnosis not present

## 2018-07-18 DIAGNOSIS — R69 Illness, unspecified: Secondary | ICD-10-CM | POA: Diagnosis not present

## 2018-07-21 ENCOUNTER — Telehealth (HOSPITAL_COMMUNITY): Payer: Self-pay | Admitting: *Deleted

## 2018-07-21 DIAGNOSIS — M542 Cervicalgia: Secondary | ICD-10-CM | POA: Diagnosis not present

## 2018-07-21 DIAGNOSIS — K5900 Constipation, unspecified: Secondary | ICD-10-CM | POA: Diagnosis not present

## 2018-07-21 DIAGNOSIS — R109 Unspecified abdominal pain: Secondary | ICD-10-CM | POA: Diagnosis not present

## 2018-07-21 DIAGNOSIS — M5090 Cervical disc disorder, unspecified, unspecified cervical region: Secondary | ICD-10-CM | POA: Diagnosis not present

## 2018-07-21 NOTE — Telephone Encounter (Signed)
Spoke with Ovid Curd Pharmacist per Dr Harrington Challenger start Sertraline on 50 mg

## 2018-07-21 NOTE — Telephone Encounter (Signed)
Start back on 50 mg

## 2018-07-21 NOTE — Telephone Encounter (Signed)
Dr Harrington Challenger Dr Nevada Crane Office LVM stating that patient had informed pharmacist that she had stopped taking her Sertraline . Pharmacist encouraged her to restart her medication. And asked if she start on the 50 mg as previous or the 75 mg that was increased @ last visit?

## 2018-07-22 ENCOUNTER — Other Ambulatory Visit: Payer: Self-pay | Admitting: Orthopedic Surgery

## 2018-07-22 DIAGNOSIS — M47812 Spondylosis without myelopathy or radiculopathy, cervical region: Secondary | ICD-10-CM

## 2018-07-22 DIAGNOSIS — M542 Cervicalgia: Secondary | ICD-10-CM

## 2018-08-22 DIAGNOSIS — I10 Essential (primary) hypertension: Secondary | ICD-10-CM | POA: Diagnosis not present

## 2018-08-22 DIAGNOSIS — E782 Mixed hyperlipidemia: Secondary | ICD-10-CM | POA: Diagnosis not present

## 2018-08-22 DIAGNOSIS — I1310 Hypertensive heart and chronic kidney disease without heart failure, with stage 1 through stage 4 chronic kidney disease, or unspecified chronic kidney disease: Secondary | ICD-10-CM | POA: Diagnosis not present

## 2018-08-22 DIAGNOSIS — R7301 Impaired fasting glucose: Secondary | ICD-10-CM | POA: Diagnosis not present

## 2018-08-25 DIAGNOSIS — E782 Mixed hyperlipidemia: Secondary | ICD-10-CM | POA: Diagnosis not present

## 2018-08-25 DIAGNOSIS — R7301 Impaired fasting glucose: Secondary | ICD-10-CM | POA: Diagnosis not present

## 2018-08-25 DIAGNOSIS — N183 Chronic kidney disease, stage 3 (moderate): Secondary | ICD-10-CM | POA: Diagnosis not present

## 2018-08-25 DIAGNOSIS — I10 Essential (primary) hypertension: Secondary | ICD-10-CM | POA: Diagnosis not present

## 2018-08-29 DIAGNOSIS — R7301 Impaired fasting glucose: Secondary | ICD-10-CM | POA: Diagnosis not present

## 2018-08-29 DIAGNOSIS — R69 Illness, unspecified: Secondary | ICD-10-CM | POA: Diagnosis not present

## 2018-08-29 DIAGNOSIS — K219 Gastro-esophageal reflux disease without esophagitis: Secondary | ICD-10-CM | POA: Diagnosis not present

## 2018-08-29 DIAGNOSIS — J060 Acute laryngopharyngitis: Secondary | ICD-10-CM | POA: Diagnosis not present

## 2018-08-29 DIAGNOSIS — I129 Hypertensive chronic kidney disease with stage 1 through stage 4 chronic kidney disease, or unspecified chronic kidney disease: Secondary | ICD-10-CM | POA: Diagnosis not present

## 2018-08-29 DIAGNOSIS — K5900 Constipation, unspecified: Secondary | ICD-10-CM | POA: Diagnosis not present

## 2018-08-29 DIAGNOSIS — D696 Thrombocytopenia, unspecified: Secondary | ICD-10-CM | POA: Diagnosis not present

## 2018-08-29 DIAGNOSIS — N183 Chronic kidney disease, stage 3 (moderate): Secondary | ICD-10-CM | POA: Diagnosis not present

## 2018-08-29 DIAGNOSIS — M1611 Unilateral primary osteoarthritis, right hip: Secondary | ICD-10-CM | POA: Diagnosis not present

## 2018-08-29 DIAGNOSIS — E782 Mixed hyperlipidemia: Secondary | ICD-10-CM | POA: Diagnosis not present

## 2018-09-09 DIAGNOSIS — R69 Illness, unspecified: Secondary | ICD-10-CM | POA: Diagnosis not present

## 2018-09-09 DIAGNOSIS — I129 Hypertensive chronic kidney disease with stage 1 through stage 4 chronic kidney disease, or unspecified chronic kidney disease: Secondary | ICD-10-CM | POA: Diagnosis not present

## 2018-09-09 DIAGNOSIS — K219 Gastro-esophageal reflux disease without esophagitis: Secondary | ICD-10-CM | POA: Diagnosis not present

## 2018-09-09 DIAGNOSIS — R7301 Impaired fasting glucose: Secondary | ICD-10-CM | POA: Diagnosis not present

## 2018-09-09 DIAGNOSIS — N183 Chronic kidney disease, stage 3 (moderate): Secondary | ICD-10-CM | POA: Diagnosis not present

## 2018-09-09 DIAGNOSIS — E782 Mixed hyperlipidemia: Secondary | ICD-10-CM | POA: Diagnosis not present

## 2018-09-18 ENCOUNTER — Other Ambulatory Visit: Payer: Self-pay

## 2018-09-18 ENCOUNTER — Ambulatory Visit (INDEPENDENT_AMBULATORY_CARE_PROVIDER_SITE_OTHER): Payer: Medicare HMO | Admitting: Psychiatry

## 2018-09-18 ENCOUNTER — Encounter (HOSPITAL_COMMUNITY): Payer: Self-pay | Admitting: Psychiatry

## 2018-09-18 VITALS — BP 146/75 | HR 88 | Ht 64.0 in | Wt 160.0 lb

## 2018-09-18 DIAGNOSIS — F321 Major depressive disorder, single episode, moderate: Secondary | ICD-10-CM

## 2018-09-18 DIAGNOSIS — M545 Low back pain: Secondary | ICD-10-CM | POA: Diagnosis not present

## 2018-09-18 DIAGNOSIS — R69 Illness, unspecified: Secondary | ICD-10-CM | POA: Diagnosis not present

## 2018-09-18 DIAGNOSIS — K5900 Constipation, unspecified: Secondary | ICD-10-CM | POA: Diagnosis not present

## 2018-09-18 MED ORDER — GABAPENTIN 100 MG PO CAPS: 100.0000 mg | ORAL_CAPSULE | Freq: Three times a day (TID) | ORAL | 2 refills | Status: DC

## 2018-09-18 MED ORDER — SERTRALINE HCL 50 MG PO TABS
75.0000 mg | ORAL_TABLET | Freq: Every day | ORAL | 2 refills | Status: DC
Start: 2018-09-18 — End: 2019-03-03

## 2018-09-18 NOTE — Progress Notes (Signed)
[facility] MD/PA/NP OP Progress Note  09/18/2018 1:29 PM Audrey Johnson  MRN:  [id number]  Chief Complaint:  Chief Complaint    Anxiety; Depression; Follow-up     HPI: This patient is a 78 year old widowed black female who lives alone in [place].  She used to work in a [employer] but is now retired.  The patient returns after 3 months for follow-up regarding depression and anxiety.  A few years ago she got depressed and very delusional and was hospitalized for a brief time.  She has done well as an outpatient since  The patient states that since we increase the gabapentin she has been feeling better and not having as many of the anxiety attacks.  She is no longer taking Xanax.  Her Zoloft was increased a bit and this seems to have helped as well.  She states that she is "feeling good".  She is eating better and has regained some weight.  She is trying to stay busy and active but she has cervical spondylosis which makes it difficult.  Nevertheless her mood seems to be good and she is very talkative and upbeat today. Visit Diagnosis:    ICD-10-CM   1. Major depressive disorder, single episode, moderate (HCC) F32.1     Past Psychiatric History: Prior hospitalization for psychotic depression  Past Medical History:  Past Medical History:  Diagnosis Date  . Anxiety   . Arthritis    in back .   Marland Kitchen Borderline diabetes   . Chronic abdominal pain   . Chronic back pain   . Chronic constipation   . Chronic nausea   . Depression   . GERD (gastroesophageal reflux disease)   . High cholesterol   . Hypertension   . Noncompliance with medication regimen   . Pneumonia   . Poor historian   . Shortness of breath     even walking causes sob...     Past Surgical History:  Procedure Laterality Date  . ABDOMINAL HYSTERECTOMY    . BREAST SURGERY  unknown (many yrs)   excision  of lump right breast  was per pt "nothing"   . CARDIAC CATHETERIZATION    . CATARACT EXTRACTION W/PHACO Left  02/21/2015   Procedure: CATARACT EXTRACTION PHACO AND INTRAOCULAR LENS PLACEMENT LEFT EYE CDE=5.86;  Surgeon: Tonny Branch, MD;  Location: [facility];  Service: Ophthalmology;  Laterality: Left;  . CATARACT EXTRACTION W/PHACO Right 03/14/2015   Procedure: CATARACT EXTRACTION PHACO AND INTRAOCULAR LENS PLACEMENT ([place]);  Surgeon: Tonny Branch, MD;  Location: [facility];  Service: Ophthalmology;  Laterality: Right;  CDE:6.36  . CHOLECYSTECTOMY N/A 01/24/2018   Procedure: LAPAROSCOPIC CHOLECYSTECTOMY;  Surgeon: Virl Cagey, MD;  Location: [facility];  Service: General;  Laterality: N/A;  . COLONOSCOPY  04/16/2007   RMR: Normal rectum and colon  . COLONOSCOPY WITH ESOPHAGOGASTRODUODENOSCOPY (EGD) N/A 01/08/2013   WUJ:WJXB erosive reflux esophagitis. Negative H.pylori. Small hiatal hernia-s/p bx (gastritis)-TCS/Colonic polyp-hyperplastic  . DILATION AND CURETTAGE OF UTERUS    . ESOPHAGOGASTRODUODENOSCOPY (EGD) WITH PROPOFOL N/A 02/14/2017   DR. Rourk: normal esophagus, small hiatal hernia  . LEFT HEART CATHETERIZATION WITH CORONARY ANGIOGRAM N/A 06/05/2012   Procedure: LEFT HEART CATHETERIZATION WITH CORONARY ANGIOGRAM;  Surgeon: Hillary Bow, MD;  Location: [facility] CATH LAB;  Service: Cardiovascular;  Laterality: N/A;  . TOE SURGERY Right    5th toe, not sure what was wrong with it    Family Psychiatric History: none  Family History:  Family History  Problem Relation Age of  Onset  . Lung disease Mother   . Diabetes Mother   . Cancer Father   . Heart disease Father   . Heart disease Sister   . Stroke Sister   . Cancer Brother   . Colon cancer Neg Hx     Social History:  Social History   Socioeconomic History  . Marital status: Widowed    Spouse name: Not on file  . Number of children: Not on file  . Years of education: Not on file  . Highest education level: Not on file  Occupational History  . Not on file  Social Needs  . Financial resource strain: Not on file  . Food insecurity:    Worry:  Not on file    Inability: Not on file  . Transportation needs:    Medical: Not on file    Non-medical: Not on file  Tobacco Use  . Smoking status: Never Smoker  . Smokeless tobacco: Never Used  . Tobacco comment: Never smoked  Substance and Sexual Activity  . Alcohol use: No  . Drug use: No  . Sexual activity: Never    Birth control/protection: Surgical  Lifestyle  . Physical activity:    Days per week: Not on file    Minutes per session: Not on file  . Stress: Not on file  Relationships  . Social connections:    Talks on phone: Not on file    Gets together: Not on file    Attends religious service: Not on file    Active member of club or organization: Not on file    Attends meetings of clubs or organizations: Not on file    Relationship status: Not on file  Other Topics Concern  . Not on file  Social History Narrative  . Not on file    Allergies: No Known Allergies  Metabolic Disorder Labs: No results found for: HGBA1C, MPG No results found for: PROLACTIN No results found for: CHOL, TRIG, HDL, CHOLHDL, VLDL, LDLCALC Lab Results  Component Value Date   TSH 2.821 07/17/2010    Therapeutic Level Labs: No results found for: LITHIUM No results found for: VALPROATE No components found for:  CBMZ  Current Medications: Current Outpatient Medications  Medication Sig Dispense Refill  . aspirin 81 MG tablet Take 2 tablets by mouth daily as needed for pain.     Marland Kitchen atorvastatin (LIPITOR) 20 MG tablet Take 20 mg by mouth at bedtime.    Marland Kitchen BYSTOLIC 5 MG tablet Take 5 mg by mouth daily.     . calcium carbonate (TUMS - DOSED IN MG ELEMENTAL CALCIUM) 500 MG chewable tablet Chew 1 tablet by mouth as needed for indigestion or heartburn.    . diclofenac sodium (VOLTAREN) 1 % GEL Apply 1 application topically as needed.  3  . docusate sodium (COLACE) 50 MG capsule Take 50 mg by mouth 2 (two) times daily.    Marland Kitchen gabapentin (NEURONTIN) 100 MG capsule Take 1 capsule (100 mg total) by  mouth 3 (three) times daily. 270 capsule 2  . lisinopril (PRINIVIL,ZESTRIL) 10 MG tablet Take 10 mg by mouth daily.    Marland Kitchen loratadine (CLARITIN) 10 MG tablet Take 10 mg by mouth daily.  3  . megestrol (MEGACE) 40 MG/ML suspension Take 10 mLs by mouth daily.  3  . meloxicam (MOBIC) 15 MG tablet Take 15 mg by mouth daily.    . sertraline (ZOLOFT) 50 MG tablet Take 1.5 tablets (75 mg total) by mouth daily. 135 tablet  2  . tiZANidine (ZANAFLEX) 4 MG tablet TAKE 1 TABLET (4 MG TOTAL) BY MOUTH EVERY 6 (SIX) HOURS AS NEEDED FOR MUSCLE SPASMS. 30 tablet 0   No current facility-administered medications for this visit.      Musculoskeletal: Strength & Muscle Tone: within normal limits Gait & Station: normal Patient leans: N/A  Psychiatric Specialty Exam: Review of Systems  Gastrointestinal: Positive for constipation.  Musculoskeletal: Positive for back pain and neck pain.  All other systems reviewed and are negative.   Blood pressure (!) 146/75, pulse 88, height 5\' 4"  (1.626 m), weight 160 lb (72.6 kg), SpO2 97 %.Body mass index is 27.46 kg/m.  General Appearance: Casual and Fairly Groomed  Eye Contact:  Good  Speech:  Clear and Coherent  Volume:  Normal  Mood:  Euthymic  Affect:  Appropriate and Congruent  Thought Process:  Goal Directed  Orientation:  Full (Time, Place, and Person)  Thought Content: Rumination   Suicidal Thoughts:  No  Homicidal Thoughts:  No  Memory:  Immediate;   Good Recent;   Fair Remote;   Fair  Judgement:  Fair  Insight:  Fair  Psychomotor Activity:  Decreased  Concentration:  Concentration: Good and Attention Span: Good  Recall:  Good  Fund of Knowledge: Fair  Language: Good  Akathisia:  No  Handed:  Right  AIMS (if indicated): not done  Assets:  Communication Skills Desire for Improvement Resilience Social Support Talents/Skills  ADL's:  Intact  Cognition: WNL  Sleep:  Good   Screenings:   Assessment and Plan:  This patient is a 78 year old  female with a history of depression and anxiety.  She is doing well on her current regimen.  She will continue Zoloft 75 mg daily for depression and gabapentin 100 mg 3 times daily for anxiety.  She will return to see me in 4 months  [name], MD 09/18/2018, 1:29 PM

## 2018-09-23 DIAGNOSIS — I10 Essential (primary) hypertension: Secondary | ICD-10-CM | POA: Diagnosis not present

## 2018-09-23 DIAGNOSIS — N183 Chronic kidney disease, stage 3 (moderate): Secondary | ICD-10-CM | POA: Diagnosis not present

## 2018-09-23 DIAGNOSIS — G4489 Other headache syndrome: Secondary | ICD-10-CM | POA: Diagnosis not present

## 2018-09-23 DIAGNOSIS — R69 Illness, unspecified: Secondary | ICD-10-CM | POA: Diagnosis not present

## 2018-10-06 DIAGNOSIS — I10 Essential (primary) hypertension: Secondary | ICD-10-CM | POA: Diagnosis not present

## 2018-10-06 DIAGNOSIS — R51 Headache: Secondary | ICD-10-CM | POA: Diagnosis not present

## 2018-10-06 DIAGNOSIS — N183 Chronic kidney disease, stage 3 (moderate): Secondary | ICD-10-CM | POA: Diagnosis not present

## 2018-10-31 ENCOUNTER — Encounter: Payer: Self-pay | Admitting: Orthopedic Surgery

## 2018-10-31 ENCOUNTER — Other Ambulatory Visit: Payer: Self-pay

## 2018-10-31 ENCOUNTER — Ambulatory Visit: Payer: Medicare HMO | Admitting: Orthopedic Surgery

## 2018-10-31 ENCOUNTER — Telehealth: Payer: Self-pay | Admitting: Orthopedic Surgery

## 2018-10-31 VITALS — Temp 97.9°F | Ht 64.0 in | Wt 160.0 lb

## 2018-10-31 DIAGNOSIS — M5410 Radiculopathy, site unspecified: Secondary | ICD-10-CM | POA: Diagnosis not present

## 2018-10-31 DIAGNOSIS — M79604 Pain in right leg: Secondary | ICD-10-CM

## 2018-10-31 MED ORDER — PREDNISONE 10 MG (48) PO TBPK: ORAL_TABLET | Freq: Every day | ORAL | 0 refills | Status: DC

## 2018-10-31 MED ORDER — METHYLPREDNISOLONE ACETATE 40 MG/ML IJ SUSP
40.0000 mg | Freq: Once | INTRAMUSCULAR | Status: AC
  Administered 2018-10-31: 40 mg via INTRAMUSCULAR

## 2018-10-31 NOTE — Telephone Encounter (Signed)
Hold meloxicam while on Steroid taper, I called to advise  To you FYI

## 2018-10-31 NOTE — Telephone Encounter (Signed)
Patient called to ask which medication(s) Dr Aline Brochure advised her to discontinue? States "did not see it on the paper". Please call; ph# [phone]

## 2018-10-31 NOTE — Progress Notes (Signed)
Chief Complaint  Patient presents with  . Leg Pain    Right leg pain radiating from hip to foot   78 year old female complains of back pain and pain running down the right leg across the knee into the right lower extremity starting at the hip  history of lower back degenerative disc disease   Review of Systems  Constitutional: Negative for chills, fever, malaise/fatigue and weight loss.  Gastrointestinal: Negative for constipation.       Denies loss bowel control   Genitourinary:       Denies urinary retention or los of bladder control     Temp 97.9 F (36.6 C)   Ht 5\' 4"  (1.626 m)   Wt 160 lb (72.6 kg)   BMI 27.46 kg/m  Physical Exam Vitals signs and nursing note reviewed.  Constitutional:      Appearance: Normal appearance.  Musculoskeletal:       Arms:  Neurological:     General: No focal deficit present.     Mental Status: She is alert and oriented to person, place, and time.     Sensory: No sensory deficit.     Motor: No weakness.     Gait: Gait abnormal.     Deep Tendon Reflexes: Reflexes normal.  Psychiatric:        Mood and Affect: Mood normal.    Assessment and plan  Encounter Diagnoses  Name Primary?  . Right leg pain   . Back pain with right-sided radiculopathy Yes    Recommend IM shot right hip Start steroid Dosepak for 12 weeks. Stop meloxicam for 2 weeks  Follow-up as needed  Nurse gave IM injection right hip  Sterile technique 40 mg Depo-Medrol 3 cc of lidocaine  No complications

## 2018-12-13 DIAGNOSIS — Z0000 Encounter for general adult medical examination without abnormal findings: Secondary | ICD-10-CM | POA: Diagnosis not present

## 2018-12-17 DIAGNOSIS — R69 Illness, unspecified: Secondary | ICD-10-CM | POA: Diagnosis not present

## 2019-01-06 ENCOUNTER — Other Ambulatory Visit: Payer: Self-pay | Admitting: Orthopedic Surgery

## 2019-01-06 DIAGNOSIS — M5410 Radiculopathy, site unspecified: Secondary | ICD-10-CM

## 2019-01-06 DIAGNOSIS — M79604 Pain in right leg: Secondary | ICD-10-CM

## 2019-01-07 NOTE — Telephone Encounter (Signed)
Rx request 

## 2019-01-19 ENCOUNTER — Ambulatory Visit (HOSPITAL_COMMUNITY): Payer: Medicare HMO | Admitting: Psychiatry

## 2019-02-20 DIAGNOSIS — E782 Mixed hyperlipidemia: Secondary | ICD-10-CM | POA: Diagnosis not present

## 2019-02-20 DIAGNOSIS — R69 Illness, unspecified: Secondary | ICD-10-CM | POA: Diagnosis not present

## 2019-02-20 DIAGNOSIS — R7303 Prediabetes: Secondary | ICD-10-CM | POA: Diagnosis not present

## 2019-02-20 DIAGNOSIS — R7301 Impaired fasting glucose: Secondary | ICD-10-CM | POA: Diagnosis not present

## 2019-02-20 DIAGNOSIS — I10 Essential (primary) hypertension: Secondary | ICD-10-CM | POA: Diagnosis not present

## 2019-02-20 DIAGNOSIS — N183 Chronic kidney disease, stage 3 (moderate): Secondary | ICD-10-CM | POA: Diagnosis not present

## 2019-02-27 ENCOUNTER — Other Ambulatory Visit: Payer: Self-pay | Admitting: Orthopedic Surgery

## 2019-02-27 DIAGNOSIS — M47812 Spondylosis without myelopathy or radiculopathy, cervical region: Secondary | ICD-10-CM

## 2019-02-27 DIAGNOSIS — M542 Cervicalgia: Secondary | ICD-10-CM

## 2019-03-03 ENCOUNTER — Encounter (HOSPITAL_COMMUNITY): Payer: Self-pay | Admitting: *Deleted

## 2019-03-03 ENCOUNTER — Other Ambulatory Visit (HOSPITAL_COMMUNITY): Payer: Self-pay | Admitting: Psychiatry

## 2019-03-03 ENCOUNTER — Telehealth (HOSPITAL_COMMUNITY): Payer: Self-pay | Admitting: *Deleted

## 2019-03-03 ENCOUNTER — Telehealth: Payer: Self-pay | Admitting: Orthopedic Surgery

## 2019-03-03 DIAGNOSIS — I129 Hypertensive chronic kidney disease with stage 1 through stage 4 chronic kidney disease, or unspecified chronic kidney disease: Secondary | ICD-10-CM | POA: Diagnosis not present

## 2019-03-03 DIAGNOSIS — E782 Mixed hyperlipidemia: Secondary | ICD-10-CM | POA: Diagnosis not present

## 2019-03-03 DIAGNOSIS — K5900 Constipation, unspecified: Secondary | ICD-10-CM | POA: Diagnosis not present

## 2019-03-03 DIAGNOSIS — E1122 Type 2 diabetes mellitus with diabetic chronic kidney disease: Secondary | ICD-10-CM | POA: Diagnosis not present

## 2019-03-03 DIAGNOSIS — N183 Chronic kidney disease, stage 3 (moderate): Secondary | ICD-10-CM | POA: Diagnosis not present

## 2019-03-03 DIAGNOSIS — K219 Gastro-esophageal reflux disease without esophagitis: Secondary | ICD-10-CM | POA: Diagnosis not present

## 2019-03-03 DIAGNOSIS — R7301 Impaired fasting glucose: Secondary | ICD-10-CM | POA: Diagnosis not present

## 2019-03-03 DIAGNOSIS — M1611 Unilateral primary osteoarthritis, right hip: Secondary | ICD-10-CM | POA: Diagnosis not present

## 2019-03-03 DIAGNOSIS — D696 Thrombocytopenia, unspecified: Secondary | ICD-10-CM | POA: Diagnosis not present

## 2019-03-03 DIAGNOSIS — R69 Illness, unspecified: Secondary | ICD-10-CM | POA: Diagnosis not present

## 2019-03-03 DIAGNOSIS — J060 Acute laryngopharyngitis: Secondary | ICD-10-CM | POA: Diagnosis not present

## 2019-03-03 MED ORDER — SERTRALINE HCL 50 MG PO TABS: 75.0000 mg | ORAL_TABLET | Freq: Every day | ORAL | 2 refills | Status: DC

## 2019-03-03 MED ORDER — GABAPENTIN 100 MG PO CAPS: 100.0000 mg | ORAL_CAPSULE | Freq: Three times a day (TID) | ORAL | 2 refills | Status: DC

## 2019-03-03 NOTE — Telephone Encounter (Signed)
ATTEMPTED TO CALL PATIENT TO INFORM PER PROVIDER: needs f/u appt & MESSAGE STATED: VOICE MAIL BOX NOT SET-UP. ATTEMPTED TO CALL Rx & REQUEST TO INFORM PATIENT THAT A F/U APPT IS NEEDED. AND THEY STATED THEY ARE UNABLE TO DO THAT

## 2019-03-03 NOTE — Telephone Encounter (Signed)
CONFIRMED WITH PATIENT & SHE WILL BE TRANSFERRING Rx. PLEASE SEND REFILL FOR SERTRALINE & ANY OTHER MED'S TO Rx LISTED. LAST REFILL ON SERTRALINE PICKED UP 01/06/2019 @  [facility]

## 2019-03-03 NOTE — Telephone Encounter (Signed)
Call received from Amy at Dr Truddie Coco office, direct [phone]; relays that patient has changed [facility]ies [facility] to [facility]; request that the prescription for medication: Gabapentin(Neurontin) be faxed to [facility] at fax#[phone], so that it will come up for refill when due.  Please advise.

## 2019-03-03 NOTE — Telephone Encounter (Signed)
Sent. She also needs f/u appt

## 2019-03-03 NOTE — Telephone Encounter (Signed)
Send her a letter

## 2019-03-04 NOTE — Telephone Encounter (Signed)
[facility] is set as default, thanks

## 2019-03-08 DIAGNOSIS — R69 Illness, unspecified: Secondary | ICD-10-CM | POA: Diagnosis not present

## 2019-03-09 DIAGNOSIS — R69 Illness, unspecified: Secondary | ICD-10-CM | POA: Diagnosis not present

## 2019-03-16 ENCOUNTER — Encounter (HOSPITAL_COMMUNITY): Payer: Self-pay | Admitting: Psychiatry

## 2019-03-16 ENCOUNTER — Other Ambulatory Visit: Payer: Self-pay

## 2019-03-16 ENCOUNTER — Ambulatory Visit (INDEPENDENT_AMBULATORY_CARE_PROVIDER_SITE_OTHER): Payer: Medicare HMO | Admitting: Psychiatry

## 2019-03-16 DIAGNOSIS — F321 Major depressive disorder, single episode, moderate: Secondary | ICD-10-CM | POA: Diagnosis not present

## 2019-03-16 DIAGNOSIS — R69 Illness, unspecified: Secondary | ICD-10-CM | POA: Diagnosis not present

## 2019-03-16 MED ORDER — GABAPENTIN 100 MG PO CAPS: 100.0000 mg | ORAL_CAPSULE | Freq: Three times a day (TID) | ORAL | 3 refills | Status: DC

## 2019-03-16 MED ORDER — SERTRALINE HCL 50 MG PO TABS: 75.0000 mg | ORAL_TABLET | Freq: Every day | ORAL | 3 refills | Status: DC

## 2019-03-16 NOTE — Progress Notes (Signed)
Virtual Visit via Telephone Note  I connected with Audrey Johnson on 03/16/19 at 10:40 AM EDT by telephone and verified that I am speaking with the correct person using two identifiers.   I discussed the limitations, risks, security and privacy concerns of performing an evaluation and management service by telephone and the availability of in person appointments. I also discussed with the patient that there may be a patient responsible charge related to this service. The patient expressed understanding and agreed to proceed.    I discussed the assessment and treatment plan with the patient. The patient was provided an opportunity to ask questions and all were answered. The patient agreed with the plan and demonstrated an understanding of the instructions.   The patient was advised to call back or seek an in-person evaluation if the symptoms worsen or if the condition fails to improve as anticipated.  I provided 15 minutes of non-face-to-face time during this encounter.   [name], MD  [facility] MD/PA/NP OP Progress Note  03/16/2019 10:50 AM Audrey Johnson  MRN:  [id number]  Chief Complaint:  Chief Complaint    Depression; Anxiety; Follow-up     HPI: This patient is a 78 year-old widowed black female who lives alone in [place].  She used to work in a [employer] but is now retired.  The patient returns after 3 months for follow-up regarding depression and anxiety.  A few years ago she got depressed and very delusional and was hospitalized for a brief time.  She has done well as an outpatient since  The patient returns for follow-up after about 6 months.  She states overall she is doing okay but still has a lot of back pain.  She is seeing an orthopedic physician but does not feel like it is helping.  She had one referral to a neurosurgeon who stated that he could not do much more to help her either.  I suggested she talk to her primary care about referral to pain management or  another neurosurgeon.  Overall however she states that her energy is good her mood has been good and she denies significant depression anxiety or delusions.  She has been attending her church every Sunday in her car.  She denies any thoughts of suicide or self-harm and feels that her medications are still helpful. Visit Diagnosis:    ICD-10-CM   1. Major depressive disorder, single episode, moderate (HCC)  F32.1     Past Psychiatric History: Prior hospitalization for psychotic depression  Past Medical History:  Past Medical History:  Diagnosis Date  . Anxiety   . Arthritis    in back .   Marland Kitchen Borderline diabetes   . Chronic abdominal pain   . Chronic back pain   . Chronic constipation   . Chronic nausea   . Depression   . GERD (gastroesophageal reflux disease)   . High cholesterol   . Hypertension   . Noncompliance with medication regimen   . Pneumonia   . Poor historian   . Shortness of breath     even walking causes sob...     Past Surgical History:  Procedure Laterality Date  . ABDOMINAL HYSTERECTOMY    . BREAST SURGERY  unknown (many yrs)   excision  of lump right breast  was per pt "nothing"   . CARDIAC CATHETERIZATION    . CATARACT EXTRACTION W/PHACO Left 02/21/2015   Procedure: CATARACT EXTRACTION PHACO AND INTRAOCULAR LENS PLACEMENT LEFT EYE CDE=5.86;  Surgeon: Levada Dy  Geoffry Paradise, MD;  Location: [facility];  Service: Ophthalmology;  Laterality: Left;  . CATARACT EXTRACTION W/PHACO Right 03/14/2015   Procedure: CATARACT EXTRACTION PHACO AND INTRAOCULAR LENS PLACEMENT ([place]);  Surgeon: Tonny Branch, MD;  Location: [facility];  Service: Ophthalmology;  Laterality: Right;  CDE:6.36  . CHOLECYSTECTOMY N/A 01/24/2018   Procedure: LAPAROSCOPIC CHOLECYSTECTOMY;  Surgeon: Virl Cagey, MD;  Location: [facility];  Service: General;  Laterality: N/A;  . COLONOSCOPY  04/16/2007   RMR: Normal rectum and colon  . COLONOSCOPY WITH ESOPHAGOGASTRODUODENOSCOPY (EGD) N/A 01/08/2013   [id number] erosive reflux  esophagitis. Negative H.pylori. Small hiatal hernia-s/p bx (gastritis)-TCS/Colonic polyp-hyperplastic  . DILATION AND CURETTAGE OF UTERUS    . ESOPHAGOGASTRODUODENOSCOPY (EGD) WITH PROPOFOL N/A 02/14/2017   DR. Rourk: normal esophagus, small hiatal hernia  . LEFT HEART CATHETERIZATION WITH CORONARY ANGIOGRAM N/A 06/05/2012   Procedure: LEFT HEART CATHETERIZATION WITH CORONARY ANGIOGRAM;  Surgeon: Hillary Bow, MD;  Location: [facility] CATH LAB;  Service: Cardiovascular;  Laterality: N/A;  . TOE SURGERY Right    5th toe, not sure what was wrong with it    Family Psychiatric History: see below  Family History:  Family History  Problem Relation Age of Onset  . Lung disease Mother   . Diabetes Mother   . Cancer Father   . Heart disease Father   . Heart disease Sister   . Stroke Sister   . Cancer Brother   . Colon cancer Neg Hx     Social History:  Social History   Socioeconomic History  . Marital status: Widowed    Spouse name: Not on file  . Number of children: Not on file  . Years of education: Not on file  . Highest education level: Not on file  Occupational History  . Not on file  Social Needs  . Financial resource strain: Not on file  . Food insecurity    Worry: Not on file    Inability: Not on file  . Transportation needs    Medical: Not on file    Non-medical: Not on file  Tobacco Use  . Smoking status: Never Smoker  . Smokeless tobacco: Never Used  . Tobacco comment: Never smoked  Substance and Sexual Activity  . Alcohol use: No  . Drug use: No  . Sexual activity: Never    Birth control/protection: Surgical  Lifestyle  . Physical activity    Days per week: Not on file    Minutes per session: Not on file  . Stress: Not on file  Relationships  . Social Herbalist on phone: Not on file    Gets together: Not on file    Attends religious service: Not on file    Active member of club or organization: Not on file    Attends meetings of clubs or  organizations: Not on file    Relationship status: Not on file  Other Topics Concern  . Not on file  Social History Narrative  . Not on file    Allergies: No Known Allergies  Metabolic Disorder Labs: No results found for: HGBA1C, MPG No results found for: PROLACTIN No results found for: CHOL, TRIG, HDL, CHOLHDL, VLDL, LDLCALC Lab Results  Component Value Date   TSH 2.821 07/17/2010    Therapeutic Level Labs: No results found for: LITHIUM No results found for: VALPROATE No components found for:  CBMZ  Current Medications: Current Outpatient Medications  Medication Sig Dispense Refill  . aspirin 81 MG tablet Take 2  tablets by mouth daily as needed for pain.     Marland Kitchen atorvastatin (LIPITOR) 20 MG tablet Take 20 mg by mouth at bedtime.    Marland Kitchen BYSTOLIC 5 MG tablet Take 5 mg by mouth daily.     Marland Kitchen gabapentin (NEURONTIN) 100 MG capsule Take 1 capsule (100 mg total) by mouth 3 (three) times daily. 270 capsule 3  . lisinopril (PRINIVIL,ZESTRIL) 10 MG tablet Take 10 mg by mouth daily.    Marland Kitchen loratadine (CLARITIN) 10 MG tablet Take 10 mg by mouth daily.  3  . megestrol (MEGACE) 40 MG/ML suspension Take 10 mLs by mouth daily.  3  . meloxicam (MOBIC) 15 MG tablet Take 15 mg by mouth daily.    . predniSONE (DELTASONE) 10 MG tablet TAKE 1 TABLET BY MOUTH EVERY DAY 30 tablet 1  . predniSONE (STERAPRED UNI-PAK 48 TAB) 10 MG (48) TBPK tablet TAKE 1 TABLET BY MOUTH EVERY DAY 48 each 0  . sertraline (ZOLOFT) 50 MG tablet Take 1.5 tablets (75 mg total) by mouth daily. 135 tablet 3  . tiZANidine (ZANAFLEX) 4 MG tablet TAKE 1 TABLET (4 MG TOTAL) BY MOUTH EVERY 6 (SIX) HOURS AS NEEDED FOR MUSCLE SPASMS. 30 tablet 0   No current facility-administered medications for this visit.      Musculoskeletal: Strength & Muscle Tone: decreased Gait & Station: unsteady Patient leans: N/A  Psychiatric Specialty Exam: Review of Systems  Musculoskeletal: Positive for back pain.  All other systems reviewed and are  negative.   There were no vitals taken for this visit.There is no height or weight on file to calculate BMI.  General Appearance: NA  Eye Contact:  NA  Speech:  Clear and Coherent  Volume:  Normal  Mood:  Euthymic  Affect:  NA  Thought Process:  Goal Directed  Orientation:  Full (Time, Place, and Person)  Thought Content: WDL   Suicidal Thoughts:  No  Homicidal Thoughts:  No  Memory:  Immediate;   Good Recent;   Good Remote;   Fair  Judgement:  Good  Insight:  Fair  Psychomotor Activity:  Decreased  Concentration:  Concentration: Good and Attention Span: Good  Recall:  Good  Fund of Knowledge: Good  Language: Good  Akathisia:  No  Handed:  Right  AIMS (if indicated): not done  Assets:  Communication Skills Desire for Improvement Resilience Social Support Talents/Skills  ADL's:  Intact  Cognition: WNL  Sleep:  Good   Screenings:   Assessment and Plan: This patient is a 78 year old female with a history of depression and anxiety.  She continues to do well on her current regimen.  She will can continue Zoloft 75 mg daily for depression and gabapentin 100 mg 3 times daily for anxiety.  She will return to see me in 4 weeks.   [name], MD 03/16/2019, 10:50 AM

## 2019-03-24 DIAGNOSIS — R69 Illness, unspecified: Secondary | ICD-10-CM | POA: Diagnosis not present

## 2019-03-24 DIAGNOSIS — L910 Hypertrophic scar: Secondary | ICD-10-CM | POA: Diagnosis not present

## 2019-03-24 DIAGNOSIS — M4802 Spinal stenosis, cervical region: Secondary | ICD-10-CM | POA: Diagnosis not present

## 2019-03-26 ENCOUNTER — Other Ambulatory Visit: Payer: Self-pay | Admitting: *Deleted

## 2019-03-26 DIAGNOSIS — Z20822 Contact with and (suspected) exposure to covid-19: Secondary | ICD-10-CM

## 2019-03-27 LAB — NOVEL CORONAVIRUS, NAA: SARS-CoV-2, NAA: NOT DETECTED

## 2019-03-30 ENCOUNTER — Telehealth: Payer: Self-pay | Admitting: General Practice

## 2019-03-30 NOTE — Telephone Encounter (Signed)
Negative COVID results given. Patient results "NOT Detected." Caller expressed understanding. ° °

## 2019-04-03 ENCOUNTER — Other Ambulatory Visit (HOSPITAL_COMMUNITY): Payer: Self-pay | Admitting: Internal Medicine

## 2019-04-03 DIAGNOSIS — Z1231 Encounter for screening mammogram for malignant neoplasm of breast: Secondary | ICD-10-CM

## 2019-04-06 DIAGNOSIS — Z9049 Acquired absence of other specified parts of digestive tract: Secondary | ICD-10-CM | POA: Diagnosis not present

## 2019-04-07 DIAGNOSIS — I10 Essential (primary) hypertension: Secondary | ICD-10-CM | POA: Diagnosis not present

## 2019-04-07 DIAGNOSIS — R7301 Impaired fasting glucose: Secondary | ICD-10-CM | POA: Diagnosis not present

## 2019-04-07 DIAGNOSIS — I1310 Hypertensive heart and chronic kidney disease without heart failure, with stage 1 through stage 4 chronic kidney disease, or unspecified chronic kidney disease: Secondary | ICD-10-CM | POA: Diagnosis not present

## 2019-04-07 DIAGNOSIS — E782 Mixed hyperlipidemia: Secondary | ICD-10-CM | POA: Diagnosis not present

## 2019-05-03 ENCOUNTER — Encounter (HOSPITAL_COMMUNITY): Payer: Self-pay | Admitting: Emergency Medicine

## 2019-05-03 ENCOUNTER — Emergency Department (HOSPITAL_BASED_OUTPATIENT_CLINIC_OR_DEPARTMENT_OTHER): Payer: Medicare HMO

## 2019-05-03 ENCOUNTER — Other Ambulatory Visit: Payer: Self-pay

## 2019-05-03 ENCOUNTER — Emergency Department (HOSPITAL_COMMUNITY): Payer: Medicare HMO

## 2019-05-03 ENCOUNTER — Emergency Department (HOSPITAL_COMMUNITY)
Admission: EM | Admit: 2019-05-03 | Discharge: 2019-05-03 | Disposition: A | Discharge status: Home / Self Care | Payer: Medicare HMO | Attending: Emergency Medicine | Admitting: Emergency Medicine

## 2019-05-03 DIAGNOSIS — R112 Nausea with vomiting, unspecified: Secondary | ICD-10-CM

## 2019-05-03 DIAGNOSIS — R1032 Left lower quadrant pain: Secondary | ICD-10-CM | POA: Diagnosis present

## 2019-05-03 DIAGNOSIS — I10 Essential (primary) hypertension: Secondary | ICD-10-CM | POA: Insufficient documentation

## 2019-05-03 DIAGNOSIS — Z7982 Long term (current) use of aspirin: Secondary | ICD-10-CM | POA: Insufficient documentation

## 2019-05-03 DIAGNOSIS — N281 Cyst of kidney, acquired: Secondary | ICD-10-CM | POA: Diagnosis not present

## 2019-05-03 DIAGNOSIS — R1030 Lower abdominal pain, unspecified: Secondary | ICD-10-CM | POA: Diagnosis not present

## 2019-05-03 DIAGNOSIS — R197 Diarrhea, unspecified: Secondary | ICD-10-CM | POA: Diagnosis not present

## 2019-05-03 DIAGNOSIS — Z79899 Other long term (current) drug therapy: Secondary | ICD-10-CM | POA: Insufficient documentation

## 2019-05-03 LAB — URINALYSIS, ROUTINE W REFLEX MICROSCOPIC
Bilirubin Urine: NEGATIVE
Glucose, UA: NEGATIVE mg/dL
Ketones, ur: NEGATIVE mg/dL
Leukocytes,Ua: NEGATIVE
Nitrite: NEGATIVE
Protein, ur: NEGATIVE mg/dL
Specific Gravity, Urine: 1.016 (ref 1.005–1.030)
pH: 5 (ref 5.0–8.0)

## 2019-05-03 LAB — COMPREHENSIVE METABOLIC PANEL
ALT: 23 U/L (ref 0–44)
AST: 22 U/L (ref 15–41)
Albumin: 3.7 g/dL (ref 3.5–5.0)
Alkaline Phosphatase: 73 U/L (ref 38–126)
Anion gap: 8 (ref 5–15)
BUN: 15 mg/dL (ref 8–23)
CO2: 23 mmol/L (ref 22–32)
Calcium: 9.6 mg/dL (ref 8.9–10.3)
Chloride: 110 mmol/L (ref 98–111)
Creatinine, Ser: 1 mg/dL (ref 0.44–1.00)
GFR calc Af Amer: >60 mL/min (ref >60)
GFR calc non Af Amer: 55 mL/min — ABNORMAL LOW (ref >60)
Glucose, Bld: 103 mg/dL — ABNORMAL HIGH (ref 70–99)
Potassium: 4.3 mmol/L (ref 3.5–5.1)
Sodium: 141 mmol/L (ref 135–145)
Total Bilirubin: 0.3 mg/dL (ref 0.3–1.2)
Total Protein: 7.3 g/dL (ref 6.5–8.1)

## 2019-05-03 LAB — CBC
HCT: 43.2 % (ref 36.0–46.0)
Hemoglobin: 13.4 g/dL (ref 12.0–15.0)
MCH: 29.8 pg (ref 26.0–34.0)
MCHC: 31 g/dL (ref 30.0–36.0)
MCV: 96.2 fL (ref 80.0–100.0)
Platelets: 421 10*3/uL — ABNORMAL HIGH (ref 150–400)
RBC: 4.49 MIL/uL (ref 3.87–5.11)
RDW: 12.8 % (ref 11.5–15.5)
WBC: 5.1 10*3/uL (ref 4.0–10.5)
nRBC: 0 % (ref 0.0–0.2)

## 2019-05-03 LAB — LIPASE, BLOOD: Lipase: 23 U/L (ref 11–51)

## 2019-05-03 MED ORDER — ONDANSETRON HCL 4 MG/2ML IJ SOLN
4.0000 mg | Freq: Once | INTRAMUSCULAR | Status: AC
  Administered 2019-05-03: 4 mg via INTRAVENOUS
  Filled 2019-05-03: qty 2

## 2019-05-03 MED ORDER — MORPHINE SULFATE (PF) 4 MG/ML IV SOLN
4.0000 mg | Freq: Once | INTRAVENOUS | Status: AC
  Administered 2019-05-03: 4 mg via INTRAVENOUS
  Filled 2019-05-03: qty 1

## 2019-05-03 MED ORDER — ONDANSETRON HCL 4 MG PO TABS: 4.0000 mg | ORAL_TABLET | Freq: Three times a day (TID) | ORAL | 0 refills | Status: DC | PRN

## 2019-05-03 MED ORDER — KETOROLAC TROMETHAMINE 30 MG/ML IJ SOLN
15.0000 mg | Freq: Once | INTRAMUSCULAR | Status: AC
  Administered 2019-05-03: 15 mg via INTRAVENOUS
  Filled 2019-05-03: qty 1

## 2019-05-03 MED ORDER — IOHEXOL 300 MG/ML  SOLN
100.0000 mL | Freq: Once | INTRAMUSCULAR | Status: AC | PRN
  Administered 2019-05-03: 100 mL via INTRAVENOUS

## 2019-05-03 MED ORDER — SODIUM CHLORIDE 0.9 % IV BOLUS
1000.0000 mL | Freq: Once | INTRAVENOUS | Status: AC
  Administered 2019-05-03: 1000 mL via INTRAVENOUS

## 2019-05-03 MED ORDER — ACETAMINOPHEN 500 MG PO TABS: 500.0000 mg | ORAL_TABLET | Freq: Four times a day (QID) | ORAL | 0 refills | Status: DC | PRN

## 2019-05-03 NOTE — Discharge Instructions (Addendum)
1. Medications: Take 1 to 2 tablets of Tylenol every 6 hours as needed for pain.  Do not exceed more than 4000 mg of Tylenol daily.  Take Zofran as needed for nausea.  Let this medicine dissolve under your tongue and wait around 10 to 20 minutes before eating or drinking after taking this medication. 2. Treatment: rest, drink plenty of fluids, advance diet slowly.  Start with water and broth then advance to bland foods that will not upset your stomach such as crackers, mashed potatoes, and peanut butter. 3. Follow Up: Please followup with your primary doctor or gastroenterologist in 3 days for discussion of your diagnoses and further evaluation after today's visit; if you do not have a primary care doctor use the resource guide provided to find one; Please return to the ER for persistent vomiting, high fevers or worsening symptoms

## 2019-05-03 NOTE — ED Notes (Signed)
Given iced water and graham crackers, tolerating well

## 2019-05-03 NOTE — ED Triage Notes (Addendum)
Pt reports continued back pain that pt has had "for years" and abd pain,n/v,occasional diarrhea, and "hot flashes" x3 weeks. LBM 05/02/19.   Pt reports is seen by Dr. Aline Brochure for neck/back pain. Pt has seen pcp for abd pain,n/v and reports no improvement in symptoms.

## 2019-05-03 NOTE — ED Provider Notes (Signed)
[facility] EMERGENCY DEPARTMENT Provider Note   CSN: [id number] Arrival date & time: 05/03/19  1348     History   Chief Complaint Chief Complaint  Patient presents with   Abdominal Pain    HPI Audrey Johnson is a 78 y.o. female with history of chronic abdominal pain, GERD, hypertension, hyperlipidemia presents for evaluation of acute onset, persistent abdominal pain for 3 weeks with associated nausea and vomiting.  She reports that she has had difficulty keeping food and fluids down since her symptoms began.  Reports dull lower abdominal pain that does not radiate.  No aggravating or alleviating factors noted.  She notes occasional nonbloody watery diarrhea.  Denies chest pain, shortness of breath, fevers, urinary symptoms, melena or hematochezia.  She has been taking her home medicines without relief of symptoms.  She is status post hysterectomy and cholecystectomy.  Denies recent travel, no known sick contacts, denies recent treatment with antibiotics or suspicious food intake.     The history is provided by the patient.    Past Medical History:  Diagnosis Date   Anxiety    Arthritis    in back .    Borderline diabetes    Chronic abdominal pain    Chronic back pain    Chronic constipation    Chronic nausea    Depression    GERD (gastroesophageal reflux disease)    High cholesterol    Hypertension    Noncompliance with medication regimen    Pneumonia    Poor historian    Shortness of breath     even walking causes sob...     Patient Active Problem List   Diagnosis Date Noted   Chronic cholecystitis 02/04/2018   Biliary dyskinesia 01/21/2018   Urinary tract infection symptoms 03/11/2017   GERD (gastroesophageal reflux disease) 07/20/2013   Abnormal LFTs 07/20/2013   Constipation 07/20/2013   Depression 04/24/2013   Chronic abdominal pain 12/04/2012   Dyspnea 05/19/2012   Benign hypertension 05/19/2012   Hyperlipidemia 05/19/2012     Past Surgical History:  Procedure Laterality Date   ABDOMINAL HYSTERECTOMY     BREAST SURGERY  unknown (many yrs)   excision  of lump right breast  was per pt "nothing"    CARDIAC CATHETERIZATION     CATARACT EXTRACTION W/PHACO Left 02/21/2015   Procedure: CATARACT EXTRACTION PHACO AND INTRAOCULAR LENS PLACEMENT LEFT EYE CDE=5.86;  Surgeon: Tonny Branch, MD;  Location: [facility];  Service: Ophthalmology;  Laterality: Left;   CATARACT EXTRACTION W/PHACO Right 03/14/2015   Procedure: CATARACT EXTRACTION PHACO AND INTRAOCULAR LENS PLACEMENT ([place]);  Surgeon: Tonny Branch, MD;  Location: [facility];  Service: Ophthalmology;  Laterality: Right;  CDE:6.36   CHOLECYSTECTOMY N/A 01/24/2018   Procedure: LAPAROSCOPIC CHOLECYSTECTOMY;  Surgeon: Virl Cagey, MD;  Location: [facility];  Service: General;  Laterality: N/A;   COLONOSCOPY  04/16/2007   RMR: Normal rectum and colon   COLONOSCOPY WITH ESOPHAGOGASTRODUODENOSCOPY (EGD) N/A 01/08/2013   [id number] erosive reflux esophagitis. Negative H.pylori. Small hiatal hernia-s/p bx (gastritis)-TCS/Colonic polyp-hyperplastic   DILATION AND CURETTAGE OF UTERUS     ESOPHAGOGASTRODUODENOSCOPY (EGD) WITH PROPOFOL N/A 02/14/2017   DR. Rourk: normal esophagus, small hiatal hernia   LEFT HEART CATHETERIZATION WITH CORONARY ANGIOGRAM N/A 06/05/2012   Procedure: LEFT HEART CATHETERIZATION WITH CORONARY ANGIOGRAM;  Surgeon: Hillary Bow, MD;  Location: [facility] CATH LAB;  Service: Cardiovascular;  Laterality: N/A;   TOE SURGERY Right    5th toe, not sure what was wrong with it  OB History   No obstetric history on file.      Home Medications    Prior to Admission medications   Medication Sig Start Date End Date Taking? Authorizing Provider  aspirin 81 MG tablet Take 2 tablets by mouth daily as needed for pain.  12/30/17  Yes [provider]  atorvastatin (LIPITOR) 20 MG tablet Take 20 mg by mouth at bedtime.   Yes Celene Squibb, MD  BYSTOLIC 5 MG  tablet Take 5 mg by mouth daily.  09/15/17  Yes [provider]  gabapentin (NEURONTIN) 100 MG capsule Take 1 capsule (100 mg total) by mouth 3 (three) times daily. 03/16/19  Yes Cloria Spring, MD  lisinopril (PRINIVIL,ZESTRIL) 10 MG tablet Take 10 mg by mouth daily.   Yes [provider]  loratadine (CLARITIN) 10 MG tablet Take 10 mg by mouth daily. 11/13/17  Yes [provider]  meloxicam (MOBIC) 15 MG tablet Take 15 mg by mouth daily.   Yes [provider]  predniSONE (DELTASONE) 10 MG tablet TAKE 1 TABLET BY MOUTH EVERY DAY 02/27/19  Yes Carole Civil, MD  sertraline (ZOLOFT) 50 MG tablet Take 1.5 tablets (75 mg total) by mouth daily. 03/16/19 03/15/20 Yes Cloria Spring, MD  tiZANidine (ZANAFLEX) 4 MG tablet TAKE 1 TABLET (4 MG TOTAL) BY MOUTH EVERY 6 (SIX) HOURS AS NEEDED FOR MUSCLE SPASMS. 06/25/18  Yes Carole Civil, MD  acetaminophen (TYLENOL) 500 MG tablet Take 1 tablet (500 mg total) by mouth every 6 (six) hours as needed. 05/03/19   Chaye Misch A, PA-C  megestrol (MEGACE) 40 MG/ML suspension Take 10 mLs by mouth daily. 10/14/17   [provider]  ondansetron (ZOFRAN) 4 MG tablet Take 1 tablet (4 mg total) by mouth every 8 (eight) hours as needed for nausea or vomiting. 05/03/19   Deby Adger A, PA-C  predniSONE (STERAPRED UNI-PAK 48 TAB) 10 MG (48) TBPK tablet TAKE 1 TABLET BY MOUTH EVERY DAY Patient not taking: Reported on 05/03/2019 01/07/19   Carole Civil, MD    Family History Family History  Problem Relation Age of Onset   Lung disease Mother    Diabetes Mother    Cancer Father    Heart disease Father    Heart disease Sister    Stroke Sister    Cancer Brother    Colon cancer Neg Hx     Social History Social History   Tobacco Use   Smoking status: Never Smoker   Smokeless tobacco: Never Used   Tobacco comment: Never smoked  Substance Use Topics   Alcohol use: No   Drug use: No     Allergies     Patient has no known allergies.   Review of Systems Review of Systems  Constitutional: Negative for chills and fever.  Respiratory: Negative for shortness of breath.   Cardiovascular: Negative for chest pain.  Gastrointestinal: Positive for abdominal pain, diarrhea, nausea and vomiting.  Genitourinary: Negative for dysuria, frequency, hematuria and urgency.  All other systems reviewed and are negative.    Physical Exam Updated Vital Signs BP (!) 155/78    Pulse 73    Temp 99.5 F (37.5 C) (Oral)    Resp 18    Ht 5\' 4"  (1.626 m)    Wt 68 kg    SpO2 97%    BMI 25.75 kg/m   Physical Exam Vitals signs and nursing note reviewed.  Constitutional:      General: She is not  in acute distress.    Appearance: She is well-developed.  HENT:     Head: Normocephalic and atraumatic.  Eyes:     General:        Right eye: No discharge.        Left eye: No discharge.     Conjunctiva/sclera: Conjunctivae normal.  Neck:     Vascular: No JVD.     Trachea: No tracheal deviation.  Cardiovascular:     Rate and Rhythm: Normal rate and regular rhythm.  Pulmonary:     Effort: Pulmonary effort is normal.     Breath sounds: Normal breath sounds.  Abdominal:     General: Abdomen is protuberant. Bowel sounds are decreased. There is no distension.     Palpations: Abdomen is soft.     Tenderness: There is abdominal tenderness in the epigastric area, suprapubic area and left lower quadrant. There is no right CVA tenderness, left CVA tenderness, guarding or rebound. Negative signs include Murphy's sign and Rovsing's sign.  Skin:    General: Skin is warm and dry.     Findings: No erythema.  Neurological:     Mental Status: She is alert.  Psychiatric:        Behavior: Behavior normal.      ED Treatments / Results  Labs (all labs ordered are listed, but only abnormal results are displayed) Labs Reviewed  COMPREHENSIVE METABOLIC PANEL - Abnormal; Notable for the following components:       Result Value   Glucose, Bld 103 (*)    GFR calc non Af Amer 55 (*)    All other components within normal limits  CBC - Abnormal; Notable for the following components:   Platelets 421 (*)    All other components within normal limits  URINALYSIS, ROUTINE W REFLEX MICROSCOPIC - Abnormal; Notable for the following components:   APPearance HAZY (*)    Hgb urine dipstick MODERATE (*)    Bacteria, UA FEW (*)    All other components within normal limits  LIPASE, BLOOD    EKG None  Radiology Ct Abdomen Pelvis W Contrast  Result Date: 05/03/2019 CLINICAL DATA:  Abdominal pain and diarrhea for 3 weeks. EXAM: CT ABDOMEN AND PELVIS WITH CONTRAST TECHNIQUE: Multidetector CT imaging of the abdomen and pelvis was performed using the standard protocol following bolus administration of intravenous contrast. CONTRAST:  166mL OMNIPAQUE IOHEXOL 300 MG/ML  SOLN COMPARISON:  CT scan 09/14/2017 FINDINGS: Lower chest: The lung bases are clear of acute process. No pleural effusion or pulmonary lesions. The heart is normal in size. No pericardial effusion. The distal esophagus and aorta are unremarkable. Hepatobiliary: No focal hepatic lesions or intrahepatic biliary dilatation. The gallbladder is surgically absent. Mild associated common bile duct dilatation. Pancreas: No mass, inflammation or ductal dilatation. Spleen: Normal size.  No focal lesions. Adrenals/Urinary Tract: The adrenal glands and kidneys are unremarkable. Stable renal cysts. No worrisome renal lesions or hydronephrosis. The bladder is unremarkable. Stomach/Bowel: The stomach, duodenum, small bowel and colon are unremarkable. No acute inflammatory changes, mass lesions or obstructive findings. The terminal ileum and appendix are normal. Vascular/Lymphatic: The aorta is normal in caliber. No dissection. Stable vascular calcifications. The branch vessels are patent. The major venous structures are patent. No mesenteric or retroperitoneal mass or  adenopathy. Small scattered lymph nodes are noted. Reproductive: Surgically absent. Other: No pelvic mass or adenopathy. No free pelvic fluid collections. No inguinal mass or adenopathy. No abdominal wall hernia or subcutaneous lesions. Musculoskeletal: No significant bony findings.  Moderate to advanced degenerative changes involving the spine. IMPRESSION: 1. No acute abdominal/pelvic findings, mass lesions or adenopathy. 2. Status post cholecystectomy with mild associated common bile duct dilatation. 3. Stable right renal cyst. 4. No findings for diverticulosis or acute diverticulitis. Electronically Signed   By: Marijo Sanes M.D.   On: 05/03/2019 17:36    Procedures Procedures (including critical care time)  Medications Ordered in ED Medications  ondansetron (ZOFRAN) injection 4 mg (4 mg Intravenous Given 05/03/19 1625)  morphine 4 MG/ML injection 4 mg (4 mg Intravenous Given 05/03/19 1625)  sodium chloride 0.9 % bolus 1,000 mL (0 mLs Intravenous Stopped 05/03/19 1751)  iohexol (OMNIPAQUE) 300 MG/ML solution 100 mL (100 mLs Intravenous Contrast Given 05/03/19 1715)  ketorolac (TORADOL) 30 MG/ML injection 15 mg (15 mg Intravenous Given 05/03/19 1804)     Initial Impression / Assessment and Plan / ED Course  I have reviewed the triage vital signs and the nursing notes.  Pertinent labs & imaging results that were available during my care of the patient were reviewed by me and considered in my medical decision making (see chart for details).        Patient presenting for evaluation of lower abdominal pain with associated nausea vomiting and diarrhea.  She has had similar pains previously.  She is afebrile, vital signs are stable.  She is nontoxic in appearance.  No peritoneal signs on examination of the abdomen though she is most tender along the left lower quadrant of the abdomen.  Lab work reviewed by me shows no leukocytosis, no anemia, no metabolic derangements, no renal insufficiency.  Lipase  and LFTs within normal limits.  UA does not suggest UTI or nephrolithiasis.  CT scan of the abdomen and pelvis was obtained which shows no evidence of acute surgical abdominal pathology including obstruction, perforation, appendicitis, cholecystitis, or diverticulitis.  She is status post hysterectomy and I doubt acute GU pathology causing her symptoms.  Her symptoms were managed in the ED and on reevaluation she is resting comfortably no apparent distress.  She is tolerating p.o. food and fluids without difficulty.  Serial abdominal examinations are benign.  Suspect possible gastroenteritis.  Will discharge with Zofran as needed for nausea and vomiting.  Instructed patient to advance diet slowly, push fluids.  Recommend help with PCP or gastroenterologist on outpatient basis for reevaluation of symptoms.  Discussed strict ED return precautions. Patient verbalized understanding of and agreement with plan and is safe for discharge home at this time.  Patient was seen and evaluated by Dr. Roslynn Amble who agrees with assessment and plan at this time.   Final Clinical Impressions(s) / ED Diagnoses   Final diagnoses:  Lower abdominal pain  Nausea vomiting and diarrhea    ED Discharge Orders         Ordered    ondansetron (ZOFRAN) 4 MG tablet  Every 8 hours PRN     05/03/19 1840    acetaminophen (TYLENOL) 500 MG tablet  Every 6 hours PRN     05/03/19 1840           Renita Papa, PA-C 05/03/19 1858    Lucrezia Starch, MD 05/04/19 1245

## 2019-05-11 ENCOUNTER — Other Ambulatory Visit: Payer: Self-pay

## 2019-05-11 ENCOUNTER — Ambulatory Visit (HOSPITAL_COMMUNITY)
Admission: RE | Admit: 2019-05-11 | Discharge: 2019-05-11 | Disposition: A | Discharge status: Home / Self Care | Payer: Medicare HMO | Source: Ambulatory Visit | Attending: Internal Medicine | Admitting: Internal Medicine

## 2019-05-11 DIAGNOSIS — Z1231 Encounter for screening mammogram for malignant neoplasm of breast: Secondary | ICD-10-CM | POA: Insufficient documentation

## 2019-05-12 ENCOUNTER — Other Ambulatory Visit: Payer: Self-pay

## 2019-05-12 DIAGNOSIS — R69 Illness, unspecified: Secondary | ICD-10-CM | POA: Diagnosis not present

## 2019-05-12 DIAGNOSIS — Z20822 Contact with and (suspected) exposure to covid-19: Secondary | ICD-10-CM

## 2019-05-12 DIAGNOSIS — K219 Gastro-esophageal reflux disease without esophagitis: Secondary | ICD-10-CM | POA: Diagnosis not present

## 2019-05-12 DIAGNOSIS — R7301 Impaired fasting glucose: Secondary | ICD-10-CM | POA: Diagnosis not present

## 2019-05-12 DIAGNOSIS — E782 Mixed hyperlipidemia: Secondary | ICD-10-CM | POA: Diagnosis not present

## 2019-05-12 DIAGNOSIS — I10 Essential (primary) hypertension: Secondary | ICD-10-CM | POA: Diagnosis not present

## 2019-05-14 DIAGNOSIS — K219 Gastro-esophageal reflux disease without esophagitis: Secondary | ICD-10-CM | POA: Diagnosis not present

## 2019-05-14 DIAGNOSIS — G4489 Other headache syndrome: Secondary | ICD-10-CM | POA: Diagnosis not present

## 2019-05-14 DIAGNOSIS — Z9049 Acquired absence of other specified parts of digestive tract: Secondary | ICD-10-CM | POA: Diagnosis not present

## 2019-05-14 DIAGNOSIS — I10 Essential (primary) hypertension: Secondary | ICD-10-CM | POA: Diagnosis not present

## 2019-05-14 DIAGNOSIS — D696 Thrombocytopenia, unspecified: Secondary | ICD-10-CM | POA: Diagnosis not present

## 2019-05-14 DIAGNOSIS — E1169 Type 2 diabetes mellitus with other specified complication: Secondary | ICD-10-CM | POA: Diagnosis not present

## 2019-05-14 DIAGNOSIS — E876 Hypokalemia: Secondary | ICD-10-CM | POA: Diagnosis not present

## 2019-05-14 DIAGNOSIS — E1122 Type 2 diabetes mellitus with diabetic chronic kidney disease: Secondary | ICD-10-CM | POA: Diagnosis not present

## 2019-05-14 DIAGNOSIS — R69 Illness, unspecified: Secondary | ICD-10-CM | POA: Diagnosis not present

## 2019-05-14 DIAGNOSIS — E782 Mixed hyperlipidemia: Secondary | ICD-10-CM | POA: Diagnosis not present

## 2019-05-14 LAB — NOVEL CORONAVIRUS, NAA: SARS-CoV-2, NAA: NOT DETECTED

## 2019-05-18 ENCOUNTER — Telehealth: Payer: Self-pay | Admitting: *Deleted

## 2019-05-18 NOTE — Telephone Encounter (Signed)
Patient called and was given negative COVID -19 test results .

## 2019-06-04 DIAGNOSIS — K219 Gastro-esophageal reflux disease without esophagitis: Secondary | ICD-10-CM | POA: Diagnosis not present

## 2019-06-04 DIAGNOSIS — R7301 Impaired fasting glucose: Secondary | ICD-10-CM | POA: Diagnosis not present

## 2019-06-04 DIAGNOSIS — R69 Illness, unspecified: Secondary | ICD-10-CM | POA: Diagnosis not present

## 2019-06-04 DIAGNOSIS — I10 Essential (primary) hypertension: Secondary | ICD-10-CM | POA: Diagnosis not present

## 2019-06-04 DIAGNOSIS — E7849 Other hyperlipidemia: Secondary | ICD-10-CM | POA: Diagnosis not present

## 2019-06-15 DIAGNOSIS — E782 Mixed hyperlipidemia: Secondary | ICD-10-CM | POA: Diagnosis not present

## 2019-06-15 DIAGNOSIS — E1122 Type 2 diabetes mellitus with diabetic chronic kidney disease: Secondary | ICD-10-CM | POA: Diagnosis not present

## 2019-06-15 DIAGNOSIS — R7301 Impaired fasting glucose: Secondary | ICD-10-CM | POA: Diagnosis not present

## 2019-06-15 DIAGNOSIS — I10 Essential (primary) hypertension: Secondary | ICD-10-CM | POA: Diagnosis not present

## 2019-06-15 DIAGNOSIS — N183 Chronic kidney disease, stage 3 unspecified: Secondary | ICD-10-CM | POA: Diagnosis not present

## 2019-06-15 DIAGNOSIS — E1169 Type 2 diabetes mellitus with other specified complication: Secondary | ICD-10-CM | POA: Diagnosis not present

## 2019-06-18 DIAGNOSIS — R109 Unspecified abdominal pain: Secondary | ICD-10-CM | POA: Diagnosis not present

## 2019-06-18 DIAGNOSIS — D696 Thrombocytopenia, unspecified: Secondary | ICD-10-CM | POA: Diagnosis not present

## 2019-06-18 DIAGNOSIS — E782 Mixed hyperlipidemia: Secondary | ICD-10-CM | POA: Diagnosis not present

## 2019-06-18 DIAGNOSIS — E1122 Type 2 diabetes mellitus with diabetic chronic kidney disease: Secondary | ICD-10-CM | POA: Diagnosis not present

## 2019-06-18 DIAGNOSIS — K219 Gastro-esophageal reflux disease without esophagitis: Secondary | ICD-10-CM | POA: Diagnosis not present

## 2019-06-18 DIAGNOSIS — M4802 Spinal stenosis, cervical region: Secondary | ICD-10-CM | POA: Diagnosis not present

## 2019-06-18 DIAGNOSIS — I129 Hypertensive chronic kidney disease with stage 1 through stage 4 chronic kidney disease, or unspecified chronic kidney disease: Secondary | ICD-10-CM | POA: Diagnosis not present

## 2019-06-18 DIAGNOSIS — R69 Illness, unspecified: Secondary | ICD-10-CM | POA: Diagnosis not present

## 2019-06-23 ENCOUNTER — Other Ambulatory Visit: Payer: Self-pay

## 2019-06-23 ENCOUNTER — Ambulatory Visit: Payer: Medicare HMO | Attending: Internal Medicine

## 2019-06-23 DIAGNOSIS — Z20822 Contact with and (suspected) exposure to covid-19: Secondary | ICD-10-CM

## 2019-06-23 DIAGNOSIS — Z20828 Contact with and (suspected) exposure to other viral communicable diseases: Secondary | ICD-10-CM | POA: Diagnosis not present

## 2019-06-25 LAB — NOVEL CORONAVIRUS, NAA: SARS-CoV-2, NAA: NOT DETECTED

## 2019-06-27 ENCOUNTER — Telehealth: Payer: Self-pay | Admitting: Internal Medicine

## 2019-06-27 NOTE — Telephone Encounter (Signed)
Negative COVID results given. Patient results "NOT Detected." Caller expressed understanding. ° °

## 2019-07-05 ENCOUNTER — Emergency Department (HOSPITAL_COMMUNITY)
Admission: EM | Admit: 2019-07-05 | Discharge: 2019-07-05 | Disposition: A | Discharge status: Home / Self Care | Payer: Medicare Other | Attending: Emergency Medicine | Admitting: Emergency Medicine

## 2019-07-05 ENCOUNTER — Encounter (HOSPITAL_COMMUNITY): Payer: Self-pay | Admitting: Emergency Medicine

## 2019-07-05 ENCOUNTER — Emergency Department (HOSPITAL_COMMUNITY): Payer: Medicare Other

## 2019-07-05 ENCOUNTER — Other Ambulatory Visit: Payer: Self-pay

## 2019-07-05 DIAGNOSIS — Z79899 Other long term (current) drug therapy: Secondary | ICD-10-CM | POA: Insufficient documentation

## 2019-07-05 DIAGNOSIS — M545 Low back pain, unspecified: Secondary | ICD-10-CM

## 2019-07-05 DIAGNOSIS — M25561 Pain in right knee: Secondary | ICD-10-CM | POA: Diagnosis not present

## 2019-07-05 DIAGNOSIS — G8929 Other chronic pain: Secondary | ICD-10-CM | POA: Insufficient documentation

## 2019-07-05 DIAGNOSIS — I10 Essential (primary) hypertension: Secondary | ICD-10-CM | POA: Diagnosis not present

## 2019-07-05 DIAGNOSIS — M1711 Unilateral primary osteoarthritis, right knee: Secondary | ICD-10-CM | POA: Diagnosis not present

## 2019-07-05 DIAGNOSIS — Z7982 Long term (current) use of aspirin: Secondary | ICD-10-CM | POA: Insufficient documentation

## 2019-07-05 DIAGNOSIS — M47816 Spondylosis without myelopathy or radiculopathy, lumbar region: Secondary | ICD-10-CM | POA: Diagnosis not present

## 2019-07-05 MED ORDER — ACETAMINOPHEN 325 MG PO TABS
650.0000 mg | ORAL_TABLET | Freq: Once | ORAL | Status: AC
  Administered 2019-07-05: 15:00:00 650 mg via ORAL
  Filled 2019-07-05: qty 2

## 2019-07-05 MED ORDER — OXYCODONE HCL 5 MG PO TABS: 5.0000 mg | ORAL_TABLET | Freq: Four times a day (QID) | ORAL | 0 refills | Status: AC | PRN

## 2019-07-05 MED ORDER — OXYCODONE-ACETAMINOPHEN 5-325 MG PO TABS
1.0000 | ORAL_TABLET | Freq: Once | ORAL | Status: AC
  Administered 2019-07-05: 1 via ORAL
  Filled 2019-07-05: qty 1

## 2019-07-05 MED ORDER — OXYCODONE HCL 5 MG PO TABS: 5.0000 mg | ORAL_TABLET | Freq: Four times a day (QID) | ORAL | 0 refills | Status: DC | PRN

## 2019-07-05 NOTE — ED Triage Notes (Signed)
Lower back pain that began three weeks ago, but is worse today. The pain runs down her right leg into her knee. Patient was unable to see PCP before January 11th.

## 2019-07-05 NOTE — ED Notes (Signed)
Departure condition documented on wrong patient @1257 . Pt does not have testicular pain.

## 2019-07-05 NOTE — Discharge Instructions (Signed)
You were seen in the ER for worsening back and right knee pain.  X-rays today show stable degenerative changes and arthritis in your back and in your knee  I recommend 500 to 1000 mg of acetaminophen (Tylenol) every 6 hours for mild to moderate pain.  You can purchase over-the-counter Voltaren gel and use to massage the joints and areas of pain.  Apply heating pad as well which can help with pain relief.  Use oxycodone 5 mg every 6 hours as needed for severe breakthrough pain.  We discussed the risks of oxycodone including sedation, drowsiness, confusion, constipation and addiction.  This medicine can cause you to be drowsy so do not drive, consume alcohol or perform any dangerous activities after taking the medicine.  You should always take a stool softener or laxative with this medicine to prevent severe constipation.  Go to your appointment on January 11 with the orthopedist for further discussion of your ongoing pain  Return to the ER for fever, worsening back pain, abdominal pain, urinary symptoms, changes to her bowel movements, loss of sensation or paralysis to your extremities

## 2019-07-05 NOTE — ED Provider Notes (Addendum)
[facility] EMERGENCY DEPARTMENT Provider Note   CSN: [id number] Arrival date & time: 07/05/19  1235     History Chief Complaint  Patient presents with  . Back Pain    Audrey Johnson is a 79 y.o. female with history of chronic neck and back pain presents to the ER for evaluation of back pain and knee pain.  Reports long history of low back pain and being told that her spine is "curved".  Also reports chronic right knee pain due to arthritis.  She sees Dr. Aline Brochure for this.  States her low back and right knee pain have worsened over the last 3 weeks and significantly worse this morning.  Her low back pain is "deep", achy and constant and worse with laying flat or bending down.  Denies any falls or trauma.  Denies any fever, urinary symptoms, changes in bowel movements, saddle anesthesia, loss of bladder or bowel control.  Her right knee has also been bothering her for the last 3 weeks, pain is constant, severe.  Has been having to use her hands over the last few weeks to help her walk due to the pain.  Denies any recent falls or injury to the knee.  Denies any significant swelling redness warmth or fevers.  No distal tingling or loss of sensation.  No lower extremity swelling or calf pain.  She takes aspirin as needed for pain but this has not helped.  Has also tried a hot bath but this makes the pain worse.  She has an appointment with Dr. Aline Brochure orthopedist on 1/11 but states the pain was significantly worse this morning and could not wait to be seen in the office.  Reports having several x-rays of her low back and knee in the past.  States she went to see an orthopedist for back and neck pain in the past but did not recommend surgery due to age.   HPI     Past Medical History:  Diagnosis Date  . Anxiety   . Arthritis    in back .   Marland Kitchen Borderline diabetes   . Chronic abdominal pain   . Chronic back pain   . Chronic constipation   . Chronic nausea   . Depression   . GERD  (gastroesophageal reflux disease)   . High cholesterol   . Hypertension   . Noncompliance with medication regimen   . Pneumonia   . Poor historian   . Shortness of breath     even walking causes sob...     Patient Active Problem List   Diagnosis Date Noted  . Chronic cholecystitis 02/04/2018  . Biliary dyskinesia 01/21/2018  . Urinary tract infection symptoms 03/11/2017  . GERD (gastroesophageal reflux disease) 07/20/2013  . Abnormal LFTs 07/20/2013  . Constipation 07/20/2013  . Depression 04/24/2013  . Chronic abdominal pain 12/04/2012  . Dyspnea 05/19/2012  . Benign hypertension 05/19/2012  . Hyperlipidemia 05/19/2012    Past Surgical History:  Procedure Laterality Date  . ABDOMINAL HYSTERECTOMY    . BREAST SURGERY  unknown (many yrs)   excision  of lump right breast  was per pt "nothing"   . CARDIAC CATHETERIZATION    . CATARACT EXTRACTION W/PHACO Left 02/21/2015   Procedure: CATARACT EXTRACTION PHACO AND INTRAOCULAR LENS PLACEMENT LEFT EYE CDE=5.86;  Surgeon: Tonny Branch, MD;  Location: [facility];  Service: Ophthalmology;  Laterality: Left;  . CATARACT EXTRACTION W/PHACO Right 03/14/2015   Procedure: CATARACT EXTRACTION PHACO AND INTRAOCULAR LENS PLACEMENT ([place]);  Surgeon: Levada Dy  Geoffry Paradise, MD;  Location: [facility];  Service: Ophthalmology;  Laterality: Right;  CDE:6.36  . CHOLECYSTECTOMY N/A 01/24/2018   Procedure: LAPAROSCOPIC CHOLECYSTECTOMY;  Surgeon: Virl Cagey, MD;  Location: [facility];  Service: General;  Laterality: N/A;  . COLONOSCOPY  04/16/2007   RMR: Normal rectum and colon  . COLONOSCOPY WITH ESOPHAGOGASTRODUODENOSCOPY (EGD) N/A 01/08/2013   [id number] erosive reflux esophagitis. Negative H.pylori. Small hiatal hernia-s/p bx (gastritis)-TCS/Colonic polyp-hyperplastic  . DILATION AND CURETTAGE OF UTERUS    . ESOPHAGOGASTRODUODENOSCOPY (EGD) WITH PROPOFOL N/A 02/14/2017   DR. Rourk: normal esophagus, small hiatal hernia  . LEFT HEART CATHETERIZATION WITH CORONARY ANGIOGRAM  N/A 06/05/2012   Procedure: LEFT HEART CATHETERIZATION WITH CORONARY ANGIOGRAM;  Surgeon: Hillary Bow, MD;  Location: [facility] CATH LAB;  Service: Cardiovascular;  Laterality: N/A;  . TOE SURGERY Right    5th toe, not sure what was wrong with it     OB History   No obstetric history on file.     Family History  Problem Relation Age of Onset  . Lung disease Mother   . Diabetes Mother   . Cancer Father   . Heart disease Father   . Heart disease Sister   . Stroke Sister   . Cancer Brother   . Colon cancer Neg Hx     Social History   Tobacco Use  . Smoking status: Never Smoker  . Smokeless tobacco: Never Used  . Tobacco comment: Never smoked  Substance Use Topics  . Alcohol use: No  . Drug use: No    Home Medications Prior to Admission medications   Medication Sig Start Date End Date Taking? Authorizing Provider  acetaminophen (TYLENOL) 500 MG tablet Take 1 tablet (500 mg total) by mouth every 6 (six) hours as needed. 05/03/19   Fawze, Mina A, PA-C  aspirin 81 MG tablet Take 2 tablets by mouth daily as needed for pain.  12/30/17   [provider]  atorvastatin (LIPITOR) 20 MG tablet Take 20 mg by mouth at bedtime.    Celene Squibb, MD  BYSTOLIC 5 MG tablet Take 5 mg by mouth daily.  09/15/17   [provider]  gabapentin (NEURONTIN) 100 MG capsule Take 1 capsule (100 mg total) by mouth 3 (three) times daily. 03/16/19   Cloria Spring, MD  lisinopril (PRINIVIL,ZESTRIL) 10 MG tablet Take 10 mg by mouth daily.    [provider]  loratadine (CLARITIN) 10 MG tablet Take 10 mg by mouth daily. 11/13/17   [provider]  megestrol (MEGACE) 40 MG/ML suspension Take 10 mLs by mouth daily. 10/14/17   [provider]  meloxicam (MOBIC) 15 MG tablet Take 15 mg by mouth daily.    [provider]  ondansetron (ZOFRAN) 4 MG tablet Take 1 tablet (4 mg total) by mouth every 8 (eight) hours as needed for nausea or vomiting. 05/03/19   Fawze, Mina  A, PA-C  oxyCODONE (OXY IR/ROXICODONE) 5 MG immediate release tablet Take 1 tablet (5 mg total) by mouth every 6 (six) hours as needed for up to 2 days for severe pain. 07/05/19 07/07/19  Kinnie Feil, PA-C  predniSONE (DELTASONE) 10 MG tablet TAKE 1 TABLET BY MOUTH EVERY DAY 02/27/19   Carole Civil, MD  predniSONE (STERAPRED UNI-PAK 48 TAB) 10 MG (48) TBPK tablet TAKE 1 TABLET BY MOUTH EVERY DAY Patient not taking: Reported on 05/03/2019 01/07/19   Carole Civil, MD  sertraline (ZOLOFT) 50 MG tablet Take 1.5 tablets (75  mg total) by mouth daily. 03/16/19 03/15/20  Cloria Spring, MD  tiZANidine (ZANAFLEX) 4 MG tablet TAKE 1 TABLET (4 MG TOTAL) BY MOUTH EVERY 6 (SIX) HOURS AS NEEDED FOR MUSCLE SPASMS. 06/25/18   Carole Civil, MD    Allergies    Patient has no known allergies.  Review of Systems   Review of Systems  Musculoskeletal: Positive for arthralgias, back pain and gait problem.  All other systems reviewed and are negative.   Physical Exam Updated Vital Signs BP 137/60   Pulse 78   Temp 98.2 F (36.8 C) (Oral)   Resp 16   Ht 5\' 4"  (1.626 m)   Wt 68 kg   SpO2 98%   BMI 25.75 kg/m   Physical Exam Vitals and nursing note reviewed.  Constitutional:      General: She is not in acute distress.    Appearance: She is well-developed.     Comments: NAD.  HENT:     Head: Normocephalic and atraumatic.     Right Ear: External ear normal.     Left Ear: External ear normal.     Nose: Nose normal.  Eyes:     General: No scleral icterus.    Conjunctiva/sclera: Conjunctivae normal.  Cardiovascular:     Rate and Rhythm: Normal rate and regular rhythm.     Heart sounds: Normal heart sounds.     Comments: 1+ DP pulses bilateral.  No lower extremity edema.  No calf tenderness. Pulmonary:     Effort: Pulmonary effort is normal.     Breath sounds: Normal breath sounds.  Abdominal:     Palpations: Abdomen is soft.     Tenderness: There is no abdominal tenderness.       Comments: No lower abdominal or CVA tenderness.  Musculoskeletal:        General: Tenderness present. No deformity. Normal range of motion.     Cervical back: Normal range of motion and neck supple.     Comments: L-spine: Diffuse, midline and paraspinal muscular tenderness.  Skin is normal over the back.  No SI joint, no sciatic notch tenderness.  Pain reproduced in the low back with passive flexion of knees bilaterally.  Negative SLR bilaterally. Right knee: Focal tenderness in the lateral joint line with mild edema.  Patient has a lot of pain with passive flexion of the knee.  No focal bony tenderness over patella, medial joint line, popliteal space, quad or patellar tendons.  Proximal and distal joints nontender normal with full range of motion.  Compartments are soft.  No calf tenderness or edema.  No joint erythema, warmth or fluctuance.  Skin:    General: Skin is warm and dry.     Capillary Refill: Capillary refill takes less than 2 seconds.  Neurological:     Mental Status: She is alert and oriented to person, place, and time.     Comments: Sensation and strength intact in lower extremities bilaterally.  Psychiatric:        Behavior: Behavior normal.        Thought Content: Thought content normal.        Judgment: Judgment normal.     ED Results / Procedures / Treatments   Labs (all labs ordered are listed, but only abnormal results are displayed) Labs Reviewed - No data to display  EKG None  Radiology DG Lumbar Spine 2-3 Views  Result Date: 07/05/2019 CLINICAL DATA:  Pain without trauma EXAM: LUMBAR SPINE - 2-3 VIEW COMPARISON:  05/03/2019 CT FINDINGS: Five lumbar type vertebral bodies. Mild S-shaped lumbar spine curvature. Cholecystectomy clips. Vascular calcification within the right lower abdomen. Sacroiliac joints are symmetric. Lumbosacral spondylosis, including degenerative disc disease at L4-5 and L5-S1. Facet arthropathy at these levels. Maintenance of vertebral body  height and alignment. IMPRESSION: Spondylosis, without acute fracture or subluxation. Electronically Signed   By: Abigail Miyamoto M.D.   On: 07/05/2019 14:07   DG Knee Complete 4 Views Right  Result Date: 07/05/2019 CLINICAL DATA:  Low back pain and knee pain for 3 weeks EXAM: RIGHT KNEE - COMPLETE 4+ VIEW COMPARISON:  09/24/2017 FINDINGS: No evidence of fracture, dislocation, or joint effusion. Moderate joint space narrowing is noted involving the patellofemoral and medial compartment. Severe joint space narrowing is noted involving the lateral compartment. Tricompartment subchondral sclerosis and marginal spur formation. No acute fracture or subluxation identified. Soft tissues are unremarkable. IMPRESSION: 1. No acute findings. 2. Advanced osteoarthritis of the right knee, most severe in the lateral compartment. Electronically Signed   By: Kerby Moors M.D.   On: 07/05/2019 14:10    Procedures Procedures (including critical care time)  Medications Ordered in ED Medications  oxyCODONE-acetaminophen (PERCOCET/ROXICET) 5-325 MG per tablet 1 tablet (1 tablet Oral Given 07/05/19 1451)  acetaminophen (TYLENOL) tablet 650 mg (650 mg Oral Given 07/05/19 1451)    ED Course  I have reviewed the triage vital signs and the nursing notes.  Pertinent labs & imaging results that were available during my care of the patient were reviewed by me and considered in my medical decision making (see chart for details).  Clinical Course as of Jul 04 1616  Sun Jul 05, 2019  1434 IMPRESSION: 1. No acute findings. 2. Advanced osteoarthritis of the right knee, most severe in the lateral compartment.    DG Knee Complete 4 Views Right [CG]  [id number]  IMPRESSION: Spondylosis, without acute fracture or subluxation.     DG Lumbar Spine 2-3 Views [CG]    Clinical Course User Index [CG] Kinnie Feil, PA-C   MDM Rules/Calculators/A&P                      EMR reviewed.  Patient has had several right knee  x-rays that shown severe arthritis.  Has been seen for chronic back pain by Dr. Aline Brochure as well.  Had a CT A/P 2 months ago for another complaint and there was moderate to advanced degenerative changes in her spine.  Today she presents with acute on chronic low back and right knee pain, nontraumatic.  She has an appointment with Dr. Aline Brochure on 1/11.  Exam as above is overall benign.  No signs of joint infection.  No neuro or pulse deficits distally.  She has no associated abdominal tenderness, urinary tract infection symptoms or any other complaints to raise suspicion for more occult pathology.  I doubt nephrolithiasis, pyelonephritis, dissection, AAA.  Her right knee is focally tender in the lateral aspect but no signs of septic arthritis.  X-rays repeated today given age and acute worsening pain.  These were personally reviewed.  Overall show chronic OA and spondylosis of the spine which is chronic for her.  No further indication for further emergent treatment.  Given severity of pain and severe arthritis, will prescribe short course of oxycodone for breakthrough and severe pain.  Discussed with patient risks of this medicine including constipation, sedation, addiction which also can predispose her to falls given her ongoing unsteady walk due to pain.  Recommended  Tylenol, Voltaren gel, heat during the day and oxycodone at nighttime with caution.  Return precautions given.  She is comfortable with this plan. Final Clinical Impression(s) / ED Diagnoses Final diagnoses:  Chronic bilateral low back pain without sciatica  Chronic pain of right knee    Rx / DC Orders ED Discharge Orders         Ordered    oxyCODONE (OXY IR/ROXICODONE) 5 MG immediate release tablet  Every 6 hours PRN,   Status:  Discontinued     07/05/19 1446    oxyCODONE (OXY IR/ROXICODONE) 5 MG immediate release tablet  Every 6 hours PRN     07/05/19 1618             Kinnie Feil, PA-C 07/05/19 1618    Long,  [facility], MD 07/05/19 [phone]

## 2019-07-05 NOTE — ED Notes (Signed)
Vitals and departure condition charge by [facility] was charted in error.

## 2019-07-13 ENCOUNTER — Encounter: Payer: Self-pay | Admitting: Orthopedic Surgery

## 2019-07-13 ENCOUNTER — Ambulatory Visit: Payer: Medicare Other | Admitting: Orthopedic Surgery

## 2019-07-13 ENCOUNTER — Other Ambulatory Visit: Payer: Self-pay

## 2019-07-13 VITALS — BP 137/75 | HR 85 | Ht 64.0 in | Wt 160.0 lb

## 2019-07-13 DIAGNOSIS — M5410 Radiculopathy, site unspecified: Secondary | ICD-10-CM | POA: Diagnosis not present

## 2019-07-13 DIAGNOSIS — G8929 Other chronic pain: Secondary | ICD-10-CM

## 2019-07-13 DIAGNOSIS — M5441 Lumbago with sciatica, right side: Secondary | ICD-10-CM

## 2019-07-13 MED ORDER — GABAPENTIN 300 MG PO CAPS: 300.0000 mg | ORAL_CAPSULE | Freq: Three times a day (TID) | ORAL | 5 refills | Status: DC

## 2019-07-13 MED ORDER — PREDNISONE 10 MG (48) PO TBPK: ORAL_TABLET | Freq: Every day | ORAL | 0 refills | Status: DC

## 2019-07-13 MED ORDER — TIZANIDINE HCL 4 MG PO TABS: 4.0000 mg | ORAL_TABLET | Freq: Four times a day (QID) | ORAL | 0 refills | Status: DC | PRN

## 2019-07-13 NOTE — Patient Instructions (Signed)
Diagnosis chronic lower back pain with exacerbation associated with right leg pain and weakness  I am going to increase your gabapentin to 300 mg 3 times a day  Restart Zanaflex 4 mg every 6 hours for muscle relaxer  Start prednisone Dosepak for 12 days

## 2019-07-13 NOTE — Progress Notes (Signed)
Chief Complaint  Patient presents with  . Back Pain  . Leg Pain    right     History this is a 79 year old female had prior problems with her lower back but presented to the emergency room on January 3 with acute exacerbation and increased lower back pain radiating down her right leg associated with weakness and but it was not controlling her pain radiographs in the emergency room showed chronic degenerative disc disease with spondylosis  She was treated with oxycodone and it was recommended that she see orthopedics for follow-up  Review of systems weakness right leg numbness tingling right leg lower back pain  Review of Systems  Constitutional: Negative for chills, fever and weight loss.  Respiratory: Negative for shortness of breath.   Cardiovascular: Negative for chest pain.  Neurological: Positive for tingling.   Past Medical History:  Diagnosis Date  . Anxiety   . Arthritis    in back .   Marland Kitchen Borderline diabetes   . Chronic abdominal pain   . Chronic back pain   . Chronic constipation   . Chronic nausea   . Depression   . GERD (gastroesophageal reflux disease)   . High cholesterol   . Hypertension   . Noncompliance with medication regimen   . Pneumonia   . Poor historian   . Shortness of breath     even walking causes sob...     BP 137/75   Pulse 85   Ht 5\' 4"  (1.626 m)   Wt 160 lb (72.6 kg)   BMI 27.46 kg/m   Physical Exam Musculoskeletal:     Lumbar back: Spasms and tenderness present. No edema or bony tenderness. Decreased range of motion. Positive right straight leg raise test. Negative left straight leg raise test. No scoliosis.     Comments: Distal neurovascular function remains intact as does motor function with toe extension dorsiflexion plantarflexion bilaterally  Reflexes 2+ at the knees 1+ at the ankle toes are downgoing    General appearance well-developed well-nourished thin body habitus  Awake alert and oriented x3 mood and affect  depressed  Gait supported by a cane limping favoring right leg  Medical decision making.  First set of notes are from neurosurgery dated July 08, 2018 those records indicate patient had cervical disc disease and nonsurgical management was recommended  Second set of records are from the emergency room dated July 05, 2019.  They indicate patient presented with acute on chronic back pain and right leg pain with deep achy constant pain worse with lying down but had no signs of cauda equina.  She was given oxycodone for pain and presents now to [place] with continued discomfort and similar symptoms  I have read her x-ray of her spine she has significant spondylosis especially at L4-S1 with degenerative disc disease facet arthritis  Encounter Diagnoses  Name Primary?  . Back pain with right-sided radiculopathy Yes  . Chronic right-sided low back pain with right-sided sciatica     Recommend MRI, call with results and refer to neurosurgery   Meds ordered this encounter  Medications  . predniSONE (STERAPRED UNI-PAK 48 TAB) 10 MG (48) TBPK tablet    Sig: Take by mouth daily.    Dispense:  48 tablet    Refill:  0  . gabapentin (NEURONTIN) 300 MG capsule    Sig: Take 1 capsule (300 mg total) by mouth 3 (three) times daily.    Dispense:  90 capsule    Refill:  5  .  tiZANidine (ZANAFLEX) 4 MG tablet    Sig: Take 1 tablet (4 mg total) by mouth every 6 (six) hours as needed for muscle spasms.    Dispense:  30 tablet    Refill:  0     Summation chronic illness with exacerbation Prescription drug management and MRI Independent x-ray interpretation

## 2019-07-15 ENCOUNTER — Ambulatory Visit (HOSPITAL_COMMUNITY): Payer: Medicare HMO | Admitting: Psychiatry

## 2019-07-15 ENCOUNTER — Telehealth: Payer: Self-pay

## 2019-07-15 ENCOUNTER — Other Ambulatory Visit: Payer: Self-pay

## 2019-07-15 NOTE — Telephone Encounter (Signed)
She will need to bring med list with her to get covid Vaccine They will help her.

## 2019-07-15 NOTE — Telephone Encounter (Signed)
Patient is asking if any of her medications will interfere with the COVID Vaccine. She did name the Prednisone, but stated if you could look at her list and let her know if anything will interact with the vaccine. I explained you were in clinic and she asked if possible you give her a call before 5 today because she will be leaving in the morning at 7:30 to go for the vaccine.  Please call and advise

## 2019-07-16 ENCOUNTER — Ambulatory Visit (HOSPITAL_COMMUNITY): Payer: Medicare HMO | Admitting: Psychiatry

## 2019-07-17 ENCOUNTER — Ambulatory Visit (HOSPITAL_COMMUNITY)
Admission: RE | Admit: 2019-07-17 | Discharge: 2019-07-17 | Disposition: A | Discharge status: Home / Self Care | Payer: Medicare Other | Source: Ambulatory Visit | Attending: Orthopedic Surgery | Admitting: Orthopedic Surgery

## 2019-07-17 ENCOUNTER — Other Ambulatory Visit: Payer: Self-pay

## 2019-07-17 DIAGNOSIS — M5410 Radiculopathy, site unspecified: Secondary | ICD-10-CM | POA: Diagnosis not present

## 2019-07-17 DIAGNOSIS — M545 Low back pain: Secondary | ICD-10-CM | POA: Diagnosis not present

## 2019-07-21 ENCOUNTER — Other Ambulatory Visit: Payer: Self-pay

## 2019-07-21 ENCOUNTER — Ambulatory Visit (HOSPITAL_COMMUNITY): Payer: Medicare Other | Admitting: Psychiatry

## 2019-07-23 IMAGING — DX DG CHEST 2V
2 series · 2 of 2 positions shown · non-contrast
Comparison: 12/05/2016

CLINICAL DATA: Chronic shortness of breath

EXAM:
CHEST - 2 VIEW

[chest pa]
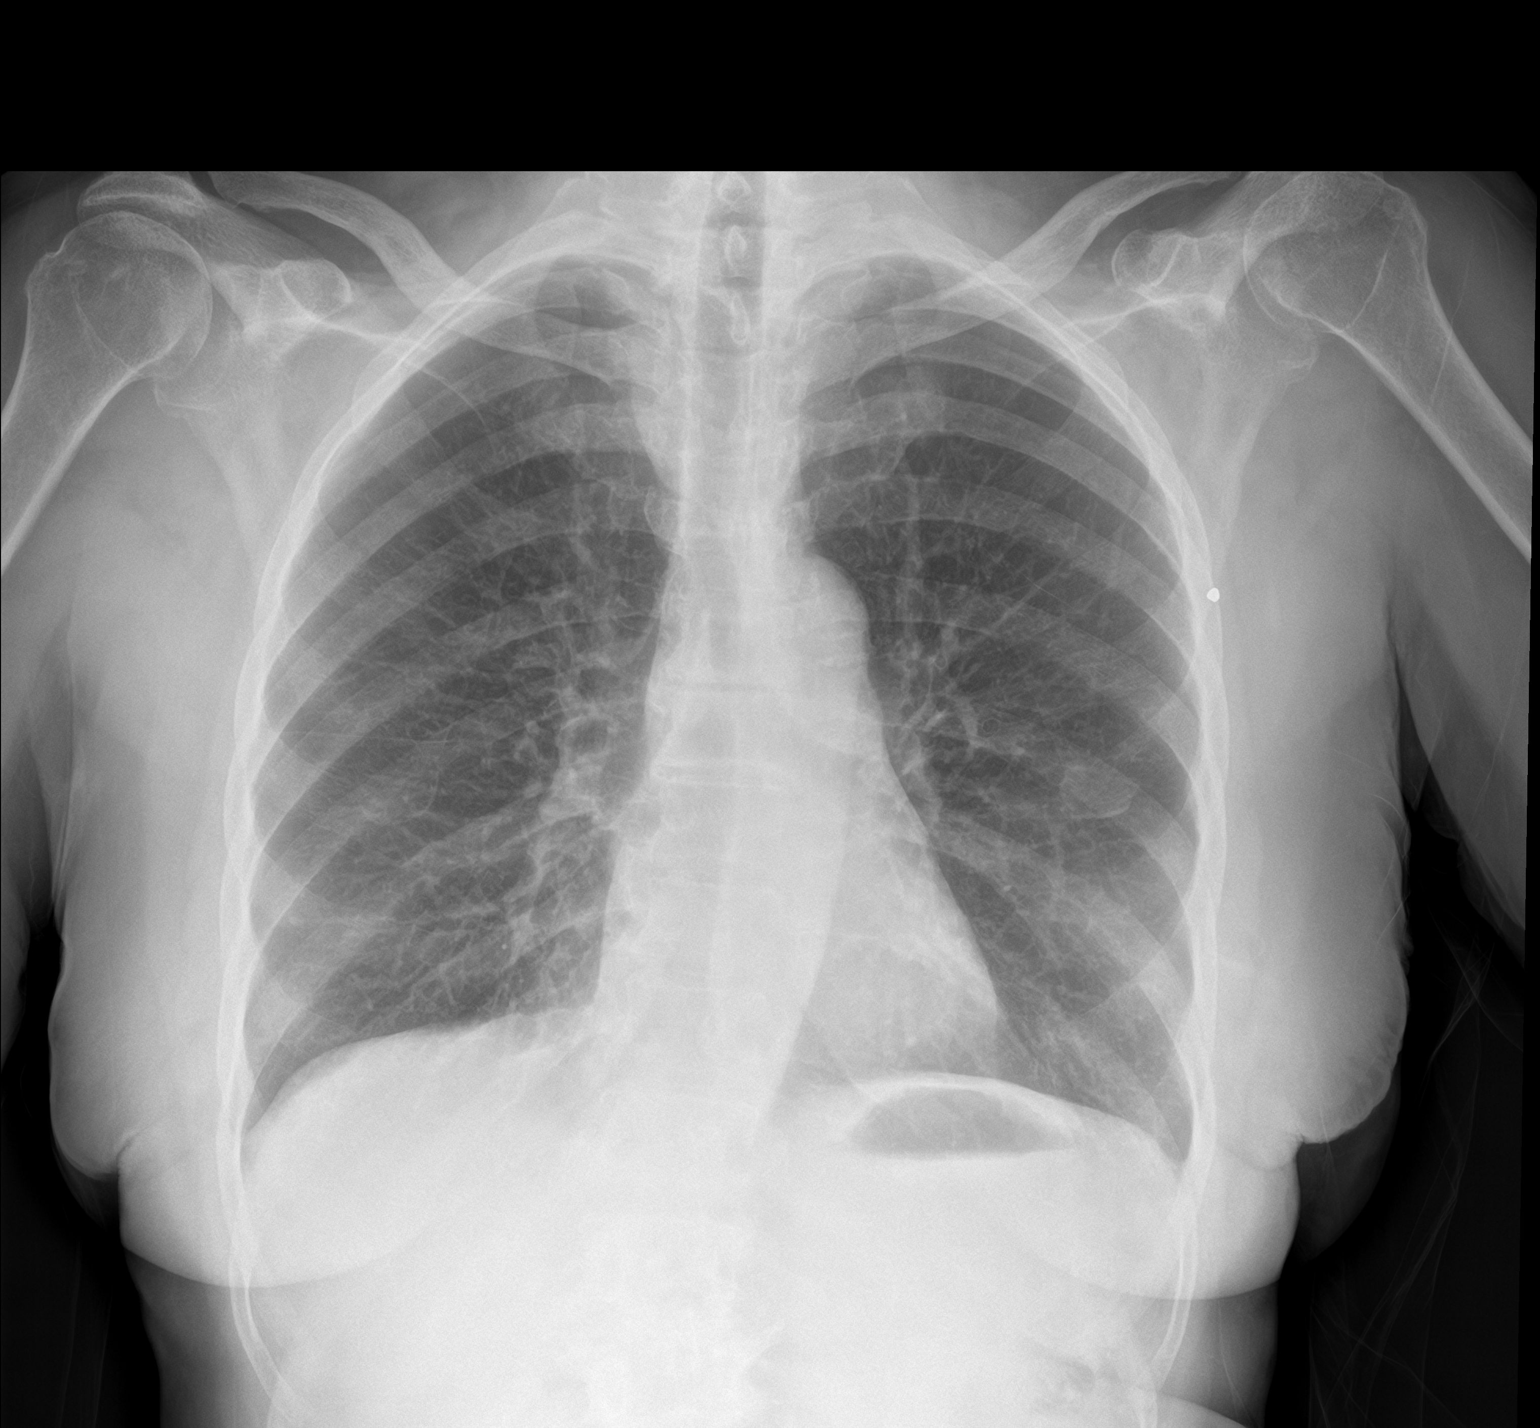

[chest lat]
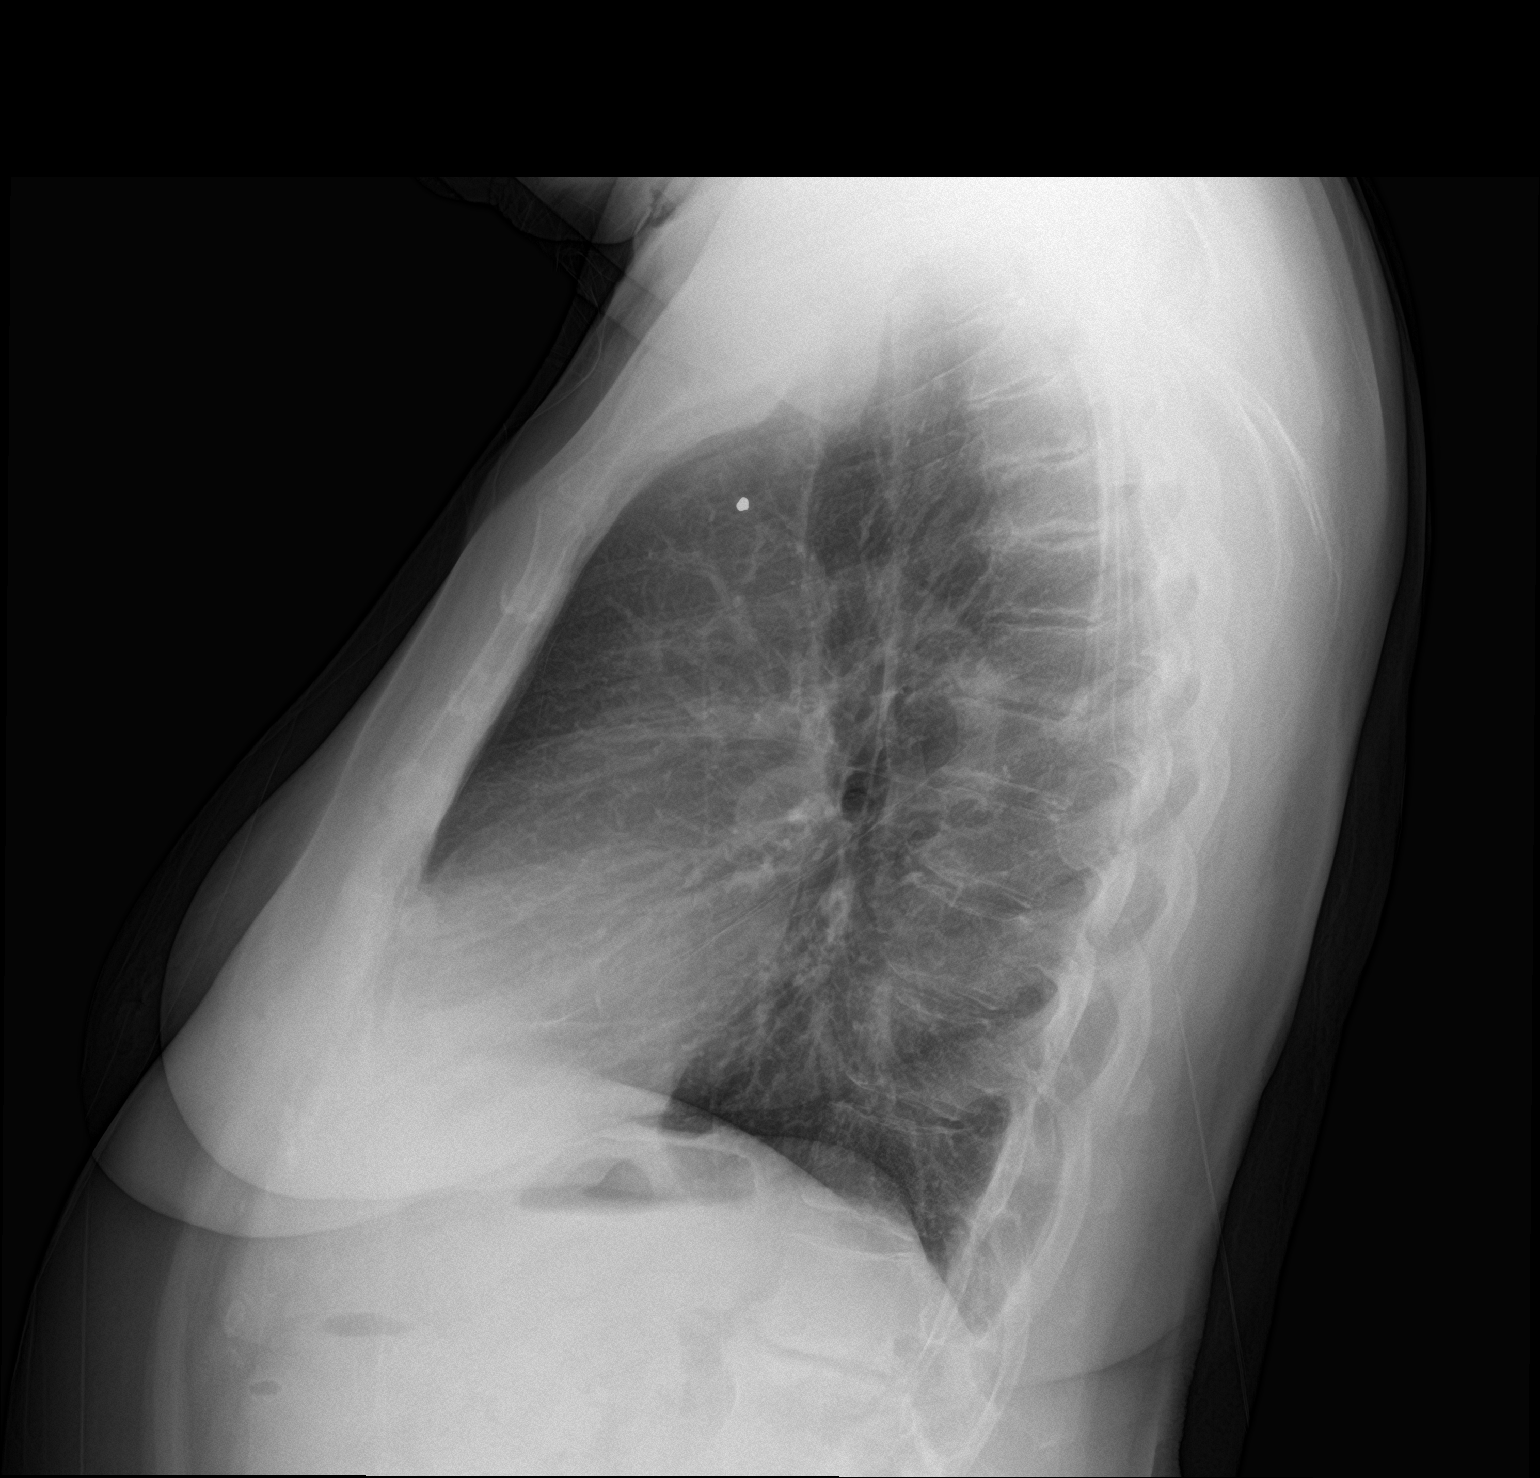

[2 of 2 positions shown; findings below may reference images not displayed]

FINDINGS: Heart and mediastinal contours are within normal limits. No focal
opacities or effusions. No acute bony abnormality. Stable mild
hyperinflation.
IMPRESSION: Stable mild hyperinflation.  No active disease.

## 2019-07-24 ENCOUNTER — Ambulatory Visit (INDEPENDENT_AMBULATORY_CARE_PROVIDER_SITE_OTHER): Payer: Medicare Other | Admitting: Psychiatry

## 2019-07-24 ENCOUNTER — Encounter (HOSPITAL_COMMUNITY): Payer: Self-pay | Admitting: Psychiatry

## 2019-07-24 ENCOUNTER — Other Ambulatory Visit: Payer: Self-pay

## 2019-07-24 DIAGNOSIS — F321 Major depressive disorder, single episode, moderate: Secondary | ICD-10-CM | POA: Diagnosis not present

## 2019-07-24 MED ORDER — SERTRALINE HCL 50 MG PO TABS: 50.0000 mg | ORAL_TABLET | Freq: Every day | ORAL | 3 refills | Status: DC

## 2019-07-24 NOTE — Progress Notes (Signed)
Virtual Visit via Telephone Note  I connected with Curlene Dolphin on 07/24/19 at 10:00 AM EST by telephone and verified that I am speaking with the correct person using two identifiers.   I discussed the limitations, risks, security and privacy concerns of performing an evaluation and management service by telephone and the availability of in person appointments. I also discussed with the patient that there may be a patient responsible charge related to this service. The patient expressed understanding and agreed to proceed.     I discussed the assessment and treatment plan with the patient. The patient was provided an opportunity to ask questions and all were answered. The patient agreed with the plan and demonstrated an understanding of the instructions.   The patient was advised to call back or seek an in-person evaluation if the symptoms worsen or if the condition fails to improve as anticipated.  I provided 15 minutes of non-face-to-face time during this encounter.   [name], MD  [facility] MD/PA/NP OP Progress Note  07/24/2019 9:55 AM Curlene Dolphin  MRN:  [id number]  Chief Complaint:  Chief Complaint    Depression; Anxiety; Follow-up     HPI: This patient is a 79 year old widowed black female who lives alone in [place].  She used to work in a [employer] but is now retired.  The patient returns after 4 months for follow-up regarding depression and anxiety.  A few years ago she got very depressed and delusional and was hospitalized for a brief time.  She is done well as an outpatient ever since.  The patient states that she is doing quite well.  She recently was started on a muscle relaxer gabapentin and a prednisone Dosepak by her orthopedic surgeon for her back pain.  She states that her back is feeling better and overall her mood is good.  She questions whether she should need to stay on the Zoloft 75 mg.  I explained to her that there could be a risk of relapse if  she stops her antidepressant.  She claims that her mood and energy are good she is sleeping well and she denies any symptoms of anxiety or suicidal ideation.  I suggested we start by cutting it down to 50 mg and going off of it very slowly.  She voices agreement. Visit Diagnosis:    ICD-10-CM   1. Major depressive disorder, single episode, moderate (HCC)  F32.1     Past Psychiatric History: 1 prior hospitalization for psychotic depression  Past Medical History:  Past Medical History:  Diagnosis Date  . Anxiety   . Arthritis    in back .   Marland Kitchen Borderline diabetes   . Chronic abdominal pain   . Chronic back pain   . Chronic constipation   . Chronic nausea   . Depression   . GERD (gastroesophageal reflux disease)   . High cholesterol   . Hypertension   . Noncompliance with medication regimen   . Pneumonia   . Poor historian   . Shortness of breath     even walking causes sob...     Past Surgical History:  Procedure Laterality Date  . ABDOMINAL HYSTERECTOMY    . BREAST SURGERY  unknown (many yrs)   excision  of lump right breast  was per pt "nothing"   . CARDIAC CATHETERIZATION    . CATARACT EXTRACTION W/PHACO Left 02/21/2015   Procedure: CATARACT EXTRACTION PHACO AND INTRAOCULAR LENS PLACEMENT LEFT EYE CDE=5.86;  Surgeon: Tonny Branch, MD;  Location: [facility];  Service: Ophthalmology;  Laterality: Left;  . CATARACT EXTRACTION W/PHACO Right 03/14/2015   Procedure: CATARACT EXTRACTION PHACO AND INTRAOCULAR LENS PLACEMENT ([place]);  Surgeon: Tonny Branch, MD;  Location: [facility];  Service: Ophthalmology;  Laterality: Right;  CDE:6.36  . CHOLECYSTECTOMY N/A 01/24/2018   Procedure: LAPAROSCOPIC CHOLECYSTECTOMY;  Surgeon: Virl Cagey, MD;  Location: [facility];  Service: General;  Laterality: N/A;  . COLONOSCOPY  04/16/2007   RMR: Normal rectum and colon  . COLONOSCOPY WITH ESOPHAGOGASTRODUODENOSCOPY (EGD) N/A 01/08/2013   [id number] erosive reflux esophagitis. Negative H.pylori. Small hiatal  hernia-s/p bx (gastritis)-TCS/Colonic polyp-hyperplastic  . DILATION AND CURETTAGE OF UTERUS    . ESOPHAGOGASTRODUODENOSCOPY (EGD) WITH PROPOFOL N/A 02/14/2017   DR. Rourk: normal esophagus, small hiatal hernia  . LEFT HEART CATHETERIZATION WITH CORONARY ANGIOGRAM N/A 06/05/2012   Procedure: LEFT HEART CATHETERIZATION WITH CORONARY ANGIOGRAM;  Surgeon: Hillary Bow, MD;  Location: [facility] CATH LAB;  Service: Cardiovascular;  Laterality: N/A;  . TOE SURGERY Right    5th toe, not sure what was wrong with it    Family Psychiatric History: see below  Family History:  Family History  Problem Relation Age of Onset  . Lung disease Mother   . Diabetes Mother   . Cancer Father   . Heart disease Father   . Heart disease Sister   . Stroke Sister   . Cancer Brother   . Colon cancer Neg Hx     Social History:  Social History   Socioeconomic History  . Marital status: Widowed    Spouse name: Not on file  . Number of children: Not on file  . Years of education: Not on file  . Highest education level: Not on file  Occupational History  . Not on file  Tobacco Use  . Smoking status: Never Smoker  . Smokeless tobacco: Never Used  . Tobacco comment: Never smoked  Substance and Sexual Activity  . Alcohol use: No  . Drug use: No  . Sexual activity: Never    Birth control/protection: Surgical  Other Topics Concern  . Not on file  Social History Narrative  . Not on file   Social Determinants of Health   Financial Resource Strain:   . Difficulty of Paying Living Expenses: Not on file  Food Insecurity:   . Worried About Charity fundraiser in the Last Year: Not on file  . Ran Out of Food in the Last Year: Not on file  Transportation Needs:   . Lack of Transportation (Medical): Not on file  . Lack of Transportation (Non-Medical): Not on file  Physical Activity:   . Days of Exercise per Week: Not on file  . Minutes of Exercise per Session: Not on file  Stress:   . Feeling of Stress :  Not on file  Social Connections:   . Frequency of Communication with Friends and Family: Not on file  . Frequency of Social Gatherings with Friends and Family: Not on file  . Attends Religious Services: Not on file  . Active Member of Clubs or Organizations: Not on file  . Attends Archivist Meetings: Not on file  . Marital Status: Not on file    Allergies: No Known Allergies  Metabolic Disorder Labs: No results found for: HGBA1C, MPG No results found for: PROLACTIN No results found for: CHOL, TRIG, HDL, CHOLHDL, VLDL, LDLCALC Lab Results  Component Value Date   TSH 2.821 07/17/2010    Therapeutic Level Labs: No results  found for: LITHIUM No results found for: VALPROATE No components found for:  CBMZ  Current Medications: Current Outpatient Medications  Medication Sig Dispense Refill  . acetaminophen (TYLENOL) 500 MG tablet Take 1 tablet (500 mg total) by mouth every 6 (six) hours as needed. 30 tablet 0  . aspirin 81 MG tablet Take 2 tablets by mouth daily as needed for pain.     Marland Kitchen atorvastatin (LIPITOR) 20 MG tablet Take 20 mg by mouth at bedtime.    Marland Kitchen BYSTOLIC 5 MG tablet Take 5 mg by mouth daily.     Marland Kitchen gabapentin (NEURONTIN) 300 MG capsule Take 1 capsule (300 mg total) by mouth 3 (three) times daily. 90 capsule 5  . lisinopril (PRINIVIL,ZESTRIL) 10 MG tablet Take 10 mg by mouth daily.    Marland Kitchen loratadine (CLARITIN) 10 MG tablet Take 10 mg by mouth daily.  3  . megestrol (MEGACE) 40 MG/ML suspension Take 10 mLs by mouth daily.  3  . meloxicam (MOBIC) 15 MG tablet Take 15 mg by mouth daily.    . ondansetron (ZOFRAN) 4 MG tablet Take 1 tablet (4 mg total) by mouth every 8 (eight) hours as needed for nausea or vomiting. 10 tablet 0  . predniSONE (STERAPRED UNI-PAK 48 TAB) 10 MG (48) TBPK tablet Take by mouth daily. 48 tablet 0  . sertraline (ZOLOFT) 50 MG tablet Take 1 tablet (50 mg total) by mouth daily. 90 tablet 3  . tiZANidine (ZANAFLEX) 4 MG tablet Take 1 tablet (4  mg total) by mouth every 6 (six) hours as needed for muscle spasms. 30 tablet 0   No current facility-administered medications for this visit.     Musculoskeletal: Strength & Muscle Tone: within normal limits Gait & Station: normal Patient leans: N/A  Psychiatric Specialty Exam: Review of Systems  Musculoskeletal: Positive for back pain.  All other systems reviewed and are negative.   There were no vitals taken for this visit.There is no height or weight on file to calculate BMI.  General Appearance: NA  Eye Contact:  NA  Speech:  Clear and Coherent  Volume:  Normal  Mood:  Euthymic  Affect:  Appropriate and Congruent  Thought Process:  Goal Directed  Orientation:  Full (Time, Place, and Person)  Thought Content: WDL   Suicidal Thoughts:  No  Homicidal Thoughts:  No  Memory:  Immediate;   Good Recent;   Good Remote;   Good  Judgement:  Good  Insight:  Fair  Psychomotor Activity:  Normal  Concentration:  Concentration: Good and Attention Span: Good  Recall:  Good  Fund of Knowledge: Good  Language: Good  Akathisia:  No  Handed:  Right  AIMS (if indicated): not done  Assets:  Communication Skills Desire for Improvement Resilience Social Support Talents/Skills  ADL's:  Intact  Cognition: WNL  Sleep:  Good   Screenings:   Assessment and Plan: This patient is a 79 year old female who has a history of one severe episode of psychotic depression.  She has been doing quite well recently.  She asked to go off the antidepressant but I would like to do this cautiously because I do not want her symptoms to recur.  We will cut back Zoloft from 75 mg to 50 mg daily as a starting point.  She will return to see me in 2 months or call sooner if any symptoms recur.   [name], MD 07/24/2019, 9:55 AM

## 2019-09-01 DIAGNOSIS — I10 Essential (primary) hypertension: Secondary | ICD-10-CM | POA: Diagnosis not present

## 2019-09-01 DIAGNOSIS — K219 Gastro-esophageal reflux disease without esophagitis: Secondary | ICD-10-CM | POA: Diagnosis not present

## 2019-09-01 DIAGNOSIS — E785 Hyperlipidemia, unspecified: Secondary | ICD-10-CM | POA: Diagnosis not present

## 2019-09-17 DIAGNOSIS — H26492 Other secondary cataract, left eye: Secondary | ICD-10-CM | POA: Diagnosis not present

## 2019-09-17 DIAGNOSIS — H04123 Dry eye syndrome of bilateral lacrimal glands: Secondary | ICD-10-CM | POA: Diagnosis not present

## 2019-09-17 DIAGNOSIS — R6889 Other general symptoms and signs: Secondary | ICD-10-CM | POA: Diagnosis not present

## 2019-09-17 DIAGNOSIS — H16143 Punctate keratitis, bilateral: Secondary | ICD-10-CM | POA: Diagnosis not present

## 2019-09-17 DIAGNOSIS — H538 Other visual disturbances: Secondary | ICD-10-CM | POA: Diagnosis not present

## 2019-09-17 DIAGNOSIS — H02831 Dermatochalasis of right upper eyelid: Secondary | ICD-10-CM | POA: Diagnosis not present

## 2019-10-06 DIAGNOSIS — M5030 Other cervical disc degeneration, unspecified cervical region: Secondary | ICD-10-CM | POA: Diagnosis not present

## 2019-10-06 DIAGNOSIS — H02831 Dermatochalasis of right upper eyelid: Secondary | ICD-10-CM | POA: Diagnosis not present

## 2019-10-06 DIAGNOSIS — Z961 Presence of intraocular lens: Secondary | ICD-10-CM | POA: Diagnosis not present

## 2019-10-06 DIAGNOSIS — H04123 Dry eye syndrome of bilateral lacrimal glands: Secondary | ICD-10-CM | POA: Diagnosis not present

## 2019-10-06 DIAGNOSIS — M4722 Other spondylosis with radiculopathy, cervical region: Secondary | ICD-10-CM | POA: Diagnosis not present

## 2019-10-06 DIAGNOSIS — H16143 Punctate keratitis, bilateral: Secondary | ICD-10-CM | POA: Diagnosis not present

## 2019-10-06 DIAGNOSIS — M4802 Spinal stenosis, cervical region: Secondary | ICD-10-CM | POA: Diagnosis not present

## 2019-10-06 DIAGNOSIS — R6889 Other general symptoms and signs: Secondary | ICD-10-CM | POA: Diagnosis not present

## 2019-10-06 DIAGNOSIS — H538 Other visual disturbances: Secondary | ICD-10-CM | POA: Diagnosis not present

## 2019-10-26 ENCOUNTER — Encounter (HOSPITAL_COMMUNITY): Payer: Self-pay | Admitting: Psychiatry

## 2019-10-26 ENCOUNTER — Telehealth (INDEPENDENT_AMBULATORY_CARE_PROVIDER_SITE_OTHER): Payer: Medicare Other | Admitting: Psychiatry

## 2019-10-26 ENCOUNTER — Other Ambulatory Visit: Payer: Self-pay

## 2019-10-26 DIAGNOSIS — F321 Major depressive disorder, single episode, moderate: Secondary | ICD-10-CM

## 2019-10-26 NOTE — Progress Notes (Signed)
Virtual Visit via Telephone Note  I connected with Audrey Johnson on 10/26/19 at 10:40 AM EDT by telephone and verified that I am speaking with the correct person using two identifiers.   I discussed the limitations, risks, security and privacy concerns of performing an evaluation and management service by telephone and the availability of in person appointments. I also discussed with the patient that there may be a patient responsible charge related to this service. The patient expressed understanding and agreed to proceed.    I discussed the assessment and treatment plan with the patient. The patient was provided an opportunity to ask questions and all were answered. The patient agreed with the plan and demonstrated an understanding of the instructions.   The patient was advised to call back or seek an in-person evaluation if the symptoms worsen or if the condition fails to improve as anticipated.  I provided 15 minutes of non-face-to-face time during this encounter.   [name], MD  [facility] MD/PA/NP OP Progress Note  10/26/2019 10:52 AM Audrey Johnson  MRN:  [id number]  Chief Complaint:  Chief Complaint    Depression; Anxiety; Follow-up     HPI: This patient is a 79 year old widowed black female who lives alone in [place].  She is retired from a Clinical cytogeneticist.  The patient returns for follow-up after 3 months regarding depression and anxiety.  Several years ago she was very depressed and delusional and was hospitalized for a brief time.  She has done fairly well as an outpatient ever since.  Last time she stated that she felt good and want to get off the antidepressant.  She is now on Zoloft 25 mg daily and plans to get off it after this month.  She does not feel that she needs it anymore.  She states her mood is good she denies anxiety she is sleeping well and her energy is fairly good.  She still having back pain.  She does not want to have any more medication sent in nor  does she want to follow-up with me at this point.  I explained that she can always reconnect with [place] if she starts to feel depressed again Visit Diagnosis:    ICD-10-CM   1. Major depressive disorder, single episode, moderate (HCC)  F32.1     Past Psychiatric History: 1 prior hospitalization for psychotic depression  Past Medical History:  Past Medical History:  Diagnosis Date  . Anxiety   . Arthritis    in back .   Marland Kitchen Borderline diabetes   . Chronic abdominal pain   . Chronic back pain   . Chronic constipation   . Chronic nausea   . Depression   . GERD (gastroesophageal reflux disease)   . High cholesterol   . Hypertension   . Noncompliance with medication regimen   . Pneumonia   . Poor historian   . Shortness of breath     even walking causes sob...     Past Surgical History:  Procedure Laterality Date  . ABDOMINAL HYSTERECTOMY    . BREAST SURGERY  unknown (many yrs)   excision  of lump right breast  was per pt "nothing"   . CARDIAC CATHETERIZATION    . CATARACT EXTRACTION W/PHACO Left 02/21/2015   Procedure: CATARACT EXTRACTION PHACO AND INTRAOCULAR LENS PLACEMENT LEFT EYE CDE=5.86;  Surgeon: Tonny Branch, MD;  Location: [facility];  Service: Ophthalmology;  Laterality: Left;  . CATARACT EXTRACTION W/PHACO Right 03/14/2015   Procedure: CATARACT EXTRACTION PHACO  AND INTRAOCULAR LENS PLACEMENT ([facility]);  Surgeon: Tonny Branch, MD;  Location: [facility];  Service: Ophthalmology;  Laterality: Right;  CDE:6.36  . CHOLECYSTECTOMY N/A 01/24/2018   Procedure: LAPAROSCOPIC CHOLECYSTECTOMY;  Surgeon: Virl Cagey, MD;  Location: [facility];  Service: General;  Laterality: N/A;  . COLONOSCOPY  04/16/2007   RMR: Normal rectum and colon  . COLONOSCOPY WITH ESOPHAGOGASTRODUODENOSCOPY (EGD) N/A 01/08/2013   [id number] erosive reflux esophagitis. Negative H.pylori. Small hiatal hernia-s/p bx (gastritis)-TCS/Colonic polyp-hyperplastic  . DILATION AND CURETTAGE OF UTERUS    . ESOPHAGOGASTRODUODENOSCOPY  (EGD) WITH PROPOFOL N/A 02/14/2017   DR. Rourk: normal esophagus, small hiatal hernia  . LEFT HEART CATHETERIZATION WITH CORONARY ANGIOGRAM N/A 06/05/2012   Procedure: LEFT HEART CATHETERIZATION WITH CORONARY ANGIOGRAM;  Surgeon: Hillary Bow, MD;  Location: [facility] CATH LAB;  Service: Cardiovascular;  Laterality: N/A;  . TOE SURGERY Right    5th toe, not sure what was wrong with it    Family Psychiatric History: see below  Family History:  Family History  Problem Relation Age of Onset  . Lung disease Mother   . Diabetes Mother   . Cancer Father   . Heart disease Father   . Heart disease Sister   . Stroke Sister   . Cancer Brother   . Colon cancer Neg Hx     Social History:  Social History   Socioeconomic History  . Marital status: Widowed    Spouse name: Not on file  . Number of children: Not on file  . Years of education: Not on file  . Highest education level: Not on file  Occupational History  . Not on file  Tobacco Use  . Smoking status: Never Smoker  . Smokeless tobacco: Never Used  . Tobacco comment: Never smoked  Substance and Sexual Activity  . Alcohol use: No  . Drug use: No  . Sexual activity: Never    Birth control/protection: Surgical  Other Topics Concern  . Not on file  Social History Narrative  . Not on file   Social Determinants of Health   Financial Resource Strain:   . Difficulty of Paying Living Expenses:   Food Insecurity:   . Worried About Charity fundraiser in the Last Year:   . Arboriculturist in the Last Year:   Transportation Needs:   . Film/video editor (Medical):   Marland Kitchen Lack of Transportation (Non-Medical):   Physical Activity:   . Days of Exercise per Week:   . Minutes of Exercise per Session:   Stress:   . Feeling of Stress :   Social Connections:   . Frequency of Communication with Friends and Family:   . Frequency of Social Gatherings with Friends and Family:   . Attends Religious Services:   . Active Member of Clubs  or Organizations:   . Attends Archivist Meetings:   Marland Kitchen Marital Status:     Allergies: No Known Allergies  Metabolic Disorder Labs: No results found for: HGBA1C, MPG No results found for: PROLACTIN No results found for: CHOL, TRIG, HDL, CHOLHDL, VLDL, LDLCALC Lab Results  Component Value Date   TSH 2.821 07/17/2010    Therapeutic Level Labs: No results found for: LITHIUM No results found for: VALPROATE No components found for:  CBMZ  Current Medications: Current Outpatient Medications  Medication Sig Dispense Refill  . acetaminophen (TYLENOL) 500 MG tablet Take 1 tablet (500 mg total) by mouth every 6 (six) hours as needed. 30 tablet 0  .  aspirin 81 MG tablet Take 2 tablets by mouth daily as needed for pain.     Marland Kitchen atorvastatin (LIPITOR) 20 MG tablet Take 20 mg by mouth at bedtime.    Marland Kitchen BYSTOLIC 5 MG tablet Take 5 mg by mouth daily.     Marland Kitchen gabapentin (NEURONTIN) 300 MG capsule Take 1 capsule (300 mg total) by mouth 3 (three) times daily. 90 capsule 5  . lisinopril (PRINIVIL,ZESTRIL) 10 MG tablet Take 10 mg by mouth daily.    Marland Kitchen loratadine (CLARITIN) 10 MG tablet Take 10 mg by mouth daily.  3  . megestrol (MEGACE) 40 MG/ML suspension Take 10 mLs by mouth daily.  3  . meloxicam (MOBIC) 15 MG tablet Take 15 mg by mouth daily.    . ondansetron (ZOFRAN) 4 MG tablet Take 1 tablet (4 mg total) by mouth every 8 (eight) hours as needed for nausea or vomiting. 10 tablet 0  . predniSONE (STERAPRED UNI-PAK 48 TAB) 10 MG (48) TBPK tablet Take by mouth daily. 48 tablet 0  . sertraline (ZOLOFT) 50 MG tablet Take 1 tablet (50 mg total) by mouth daily. 90 tablet 3  . tiZANidine (ZANAFLEX) 4 MG tablet Take 1 tablet (4 mg total) by mouth every 6 (six) hours as needed for muscle spasms. 30 tablet 0   No current facility-administered medications for this visit.     Musculoskeletal: Strength & Muscle Tone: decreased Gait & Station: normal Patient leans: N/A  Psychiatric Specialty  Exam: Review of Systems  Musculoskeletal: Positive for arthralgias and back pain.  All other systems reviewed and are negative.   There were no vitals taken for this visit.There is no height or weight on file to calculate BMI.  General Appearance: NA  Eye Contact:  NA  Speech:  Clear and Coherent  Volume:  Normal  Mood:  Euthymic  Affect:  NA  Thought Process:  Goal Directed  Orientation:  Full (Time, Place, and Person)  Thought Content: WDL   Suicidal Thoughts:  No  Homicidal Thoughts:  No  Memory:  Immediate;   Good Recent;   Good Remote;   NA  Judgement:  Good  Insight:  Fair  Psychomotor Activity:  Decreased  Concentration:  Concentration: Good and Attention Span: Good  Recall:  Good  Fund of Knowledge: Fair  Language: Good  Akathisia:  No  Handed:  Right  AIMS (if indicated): not done  Assets:  Communication Skills Desire for Improvement Resilience Social Support Talents/Skills  ADL's:  Intact  Cognition: WNL  Sleep:  Good   Screenings:   Assessment and Plan: This patient is a 79 year old female with a history of one severe episode of psychotic depression.  She is determined to go off Zoloft as she does not feel she needs it anymore so she will discontinue after this month.  She is currently on 25 mg daily.  She does not really request follow-up here but may call me at any point if her depression recurs.   [name], MD 10/26/2019, 10:52 AM

## 2019-11-05 ENCOUNTER — Other Ambulatory Visit: Payer: Self-pay

## 2019-11-05 ENCOUNTER — Ambulatory Visit: Payer: Medicare Other | Admitting: Orthopedic Surgery

## 2019-11-05 VITALS — BP 124/67 | HR 69 | Temp 97.2°F | Ht 64.0 in | Wt 160.0 lb

## 2019-11-05 DIAGNOSIS — M5441 Lumbago with sciatica, right side: Secondary | ICD-10-CM

## 2019-11-05 DIAGNOSIS — M5410 Radiculopathy, site unspecified: Secondary | ICD-10-CM | POA: Diagnosis not present

## 2019-11-05 DIAGNOSIS — G8929 Other chronic pain: Secondary | ICD-10-CM | POA: Diagnosis not present

## 2019-11-05 MED ORDER — PREDNISONE 10 MG (48) PO TBPK: ORAL_TABLET | Freq: Every day | ORAL | 0 refills | Status: DC

## 2019-11-05 MED ORDER — TRAMADOL-ACETAMINOPHEN 37.5-325 MG PO TABS: 1.0000 | ORAL_TABLET | Freq: Four times a day (QID) | ORAL | 0 refills | Status: AC | PRN

## 2019-11-05 MED ORDER — TIZANIDINE HCL 4 MG PO TABS: 4.0000 mg | ORAL_TABLET | Freq: Four times a day (QID) | ORAL | 0 refills | Status: DC | PRN

## 2019-11-05 NOTE — Progress Notes (Signed)
Chief Complaint  Patient presents with  . Knee Pain    Recheck on right knee    79 year old female with history of degenerative disc disease was set up for epidural injections and was canceled twice.  She presents now with increasing leg pain which involves her knee.  However the arthritis is not the primary source of pain in this situation  No Known Allergies  Past Medical History:  Diagnosis Date  . Anxiety   . Arthritis    in back .   Marland Kitchen Borderline diabetes   . Chronic abdominal pain   . Chronic back pain   . Chronic constipation   . Chronic nausea   . Depression   . GERD (gastroesophageal reflux disease)   . High cholesterol   . Hypertension   . Noncompliance with medication regimen   . Pneumonia   . Poor historian   . Shortness of breath     even walking causes sob...      She has been on gabapentin 300, 3 times a day steroid Dosepak Zanaflex  Does have chronic osteoarthritis of the knee last x-ray was January 2021 she has a lot of secondary bone changes but her joint spaces are maintained  Recommend epidural injection series  Resume prednisone Dosepak tizanidine and Ultracet  Encounter Diagnoses  Name Primary?  . Back pain with right-sided radiculopathy Yes  . Chronic right-sided low back pain with right-sided sciatica      Meds ordered this encounter  Medications  . traMADol-acetaminophen (ULTRACET) 37.5-325 MG tablet    Sig: Take 1 tablet by mouth every 6 (six) hours as needed for up to 5 days.    Dispense:  20 tablet    Refill:  0  . predniSONE (STERAPRED UNI-PAK 48 TAB) 10 MG (48) TBPK tablet    Sig: Take by mouth daily.    Dispense:  48 tablet    Refill:  0  . tiZANidine (ZANAFLEX) 4 MG tablet    Sig: Take 1 tablet (4 mg total) by mouth every 6 (six) hours as needed for muscle spasms.    Dispense:  30 tablet    Refill:  0   Fu 8 weeks

## 2019-11-05 NOTE — Patient Instructions (Signed)
rescheduled for epidural series

## 2019-12-02 ENCOUNTER — Telehealth: Payer: Self-pay

## 2019-12-02 ENCOUNTER — Encounter: Payer: Medicare Other | Admitting: Physical Medicine and Rehabilitation

## 2019-12-02 DIAGNOSIS — R6889 Other general symptoms and signs: Secondary | ICD-10-CM | POA: Diagnosis not present

## 2019-12-02 DIAGNOSIS — M5410 Radiculopathy, site unspecified: Secondary | ICD-10-CM

## 2019-12-02 NOTE — Telephone Encounter (Signed)
Patient called stating that she went to her appointment at Dr. Romona Curls office and was told that he had retired and they had no appointment for her there. I look at her appointment desk shared with her that they had she was late and needed to be rescheduled. She stated that she was there early and restated what she was told; also stated that she was told they would reschedule her with Dr. Assunta Curtis.  I called Dr. Romona Curls office and was told that she was 15 minutes late and needed to reschedule. They had no idea where she got all the above. Would you please give this patient a call and talk with her about this. She asked for Dr. Ruthe Mannan nurse.

## 2019-12-03 ENCOUNTER — Other Ambulatory Visit: Payer: Self-pay | Admitting: Orthopedic Surgery

## 2019-12-03 DIAGNOSIS — M5410 Radiculopathy, site unspecified: Secondary | ICD-10-CM

## 2019-12-03 NOTE — Telephone Encounter (Signed)
I called her to give her the number to reschedule patient states she was told she did not have appointment Audrey Johnson was told she showed up late  Patient states she never arrives late for any appointments and would like to go elsewhere .   I have put in order for her to go to [place], told her they will call her.   To [name]

## 2019-12-07 ENCOUNTER — Telehealth: Payer: Self-pay | Admitting: Radiology

## 2019-12-07 ENCOUNTER — Telehealth: Payer: Self-pay | Admitting: Physical Medicine and Rehabilitation

## 2019-12-07 NOTE — Telephone Encounter (Signed)
Patient was [facility] appointment with Dr Ernestina Patches But she requested to go to [place], I sent order to [place] per her request  She no showed the [facility] appointment, but she told me she did not want to go back there, her appointment was [facility] when she went in previously because she was late, patient states she was on time, and when she arrived she was told she did not have appointment and that Dr Ernestina Patches had retired.

## 2019-12-07 NOTE — Telephone Encounter (Signed)
Patient called to cancel her appointment with [facility] but she has already missed it.

## 2019-12-15 ENCOUNTER — Ambulatory Visit
Admission: RE | Admit: 2019-12-15 | Discharge: 2019-12-15 | Disposition: A | Discharge status: Home / Self Care | Payer: Medicare Other | Source: Ambulatory Visit | Attending: Orthopedic Surgery | Admitting: Orthopedic Surgery

## 2019-12-15 DIAGNOSIS — M48061 Spinal stenosis, lumbar region without neurogenic claudication: Secondary | ICD-10-CM | POA: Diagnosis not present

## 2019-12-15 DIAGNOSIS — M5410 Radiculopathy, site unspecified: Secondary | ICD-10-CM

## 2019-12-15 DIAGNOSIS — R6889 Other general symptoms and signs: Secondary | ICD-10-CM | POA: Diagnosis not present

## 2019-12-15 MED ORDER — IOPAMIDOL (ISOVUE-M 200) INJECTION 41%
1.0000 mL | Freq: Once | INTRAMUSCULAR | Status: AC
  Administered 2019-12-15: 1 mL via EPIDURAL

## 2019-12-15 MED ORDER — METHYLPREDNISOLONE ACETATE 40 MG/ML INJ SUSP (RADIOLOG
120.0000 mg | Freq: Once | INTRAMUSCULAR | Status: AC
  Administered 2019-12-15: 120 mg via EPIDURAL

## 2020-01-06 ENCOUNTER — Ambulatory Visit: Payer: Medicare Other | Admitting: Orthopedic Surgery

## 2020-01-06 ENCOUNTER — Other Ambulatory Visit: Payer: Self-pay | Admitting: Orthopedic Surgery

## 2020-01-06 ENCOUNTER — Other Ambulatory Visit: Payer: Self-pay

## 2020-01-06 ENCOUNTER — Encounter: Payer: Self-pay | Admitting: Orthopedic Surgery

## 2020-01-06 VITALS — BP 124/59 | HR 76 | Ht 64.0 in | Wt 160.0 lb

## 2020-01-06 DIAGNOSIS — M5410 Radiculopathy, site unspecified: Secondary | ICD-10-CM

## 2020-01-06 DIAGNOSIS — M1711 Unilateral primary osteoarthritis, right knee: Secondary | ICD-10-CM | POA: Diagnosis not present

## 2020-01-06 NOTE — Patient Instructions (Signed)
GET 2 MORE ESIs  You have received an injection of steroids into the joint. 15% of patients will have increased pain within the 24 hours postinjection.   This is transient and will go away.   We recommend that you use ice packs on the injection site for 20 minutes every 2 hours and extra strength Tylenol 2 tablets every 8 as needed until the pain resolves.  If you continue to have pain after taking the Tylenol and using the ice please call the office for further instructions.

## 2020-01-06 NOTE — Progress Notes (Signed)
Chief Complaint  Patient presents with  . Back Pain    a little better after ESI    79 year old female with chronic degenerative disc disease status post 1 epidural injection with some improvement but still persistent right leg radicular pain which also involves her right knee which is also chronically painful especially on the lateral side  She is agreeable to injection in the right need to temporarily relieve some of the symptoms layer and we will repeat the ESI's x2 and then follow-up with me in 4 to 6 weeks.  Procedure note right knee injection   verbal consent was obtained to inject right knee joint  Timeout was completed to confirm the site of injection  The medications used were 40 mg of Depo-Medrol and 1% lidocaine 3 cc  Anesthesia was provided by ethyl chloride and the skin was prepped with alcohol.  After cleaning the skin with alcohol a 20-gauge needle was used to inject the right knee joint. There were no complications. A sterile bandage was applied.   Encounter Diagnoses  Name Primary?  . Back pain with right-sided radiculopathy Yes  . Arthritis of knee, right

## 2020-01-13 ENCOUNTER — Inpatient Hospital Stay
Admission: RE | Admit: 2020-01-13 | Discharge: 2020-01-13 | Disposition: A | Discharge status: Home / Self Care | Payer: Medicare Other | Source: Ambulatory Visit | Attending: Orthopedic Surgery | Admitting: Orthopedic Surgery

## 2020-01-13 NOTE — Discharge Instructions (Signed)

## 2020-01-14 ENCOUNTER — Other Ambulatory Visit: Payer: Medicare Other

## 2020-01-19 ENCOUNTER — Other Ambulatory Visit: Payer: Self-pay

## 2020-01-19 ENCOUNTER — Ambulatory Visit
Admission: RE | Admit: 2020-01-19 | Discharge: 2020-01-19 | Disposition: A | Discharge status: Home / Self Care | Payer: Medicare Other | Source: Ambulatory Visit | Attending: Orthopedic Surgery | Admitting: Orthopedic Surgery

## 2020-01-19 DIAGNOSIS — M48061 Spinal stenosis, lumbar region without neurogenic claudication: Secondary | ICD-10-CM | POA: Diagnosis not present

## 2020-01-19 DIAGNOSIS — R6889 Other general symptoms and signs: Secondary | ICD-10-CM | POA: Diagnosis not present

## 2020-01-19 DIAGNOSIS — M5410 Radiculopathy, site unspecified: Secondary | ICD-10-CM

## 2020-01-19 MED ORDER — METHYLPREDNISOLONE ACETATE 40 MG/ML INJ SUSP (RADIOLOG
120.0000 mg | Freq: Once | INTRAMUSCULAR | Status: AC
  Administered 2020-01-19: 120 mg via EPIDURAL

## 2020-01-19 MED ORDER — IOPAMIDOL (ISOVUE-M 200) INJECTION 41%
1.0000 mL | Freq: Once | INTRAMUSCULAR | Status: AC
  Administered 2020-01-19: 1 mL via EPIDURAL

## 2020-01-19 NOTE — Discharge Instructions (Signed)

## 2020-02-01 DIAGNOSIS — K219 Gastro-esophageal reflux disease without esophagitis: Secondary | ICD-10-CM | POA: Diagnosis not present

## 2020-02-01 DIAGNOSIS — I10 Essential (primary) hypertension: Secondary | ICD-10-CM | POA: Diagnosis not present

## 2020-02-01 DIAGNOSIS — E782 Mixed hyperlipidemia: Secondary | ICD-10-CM | POA: Diagnosis not present

## 2020-02-09 DIAGNOSIS — M4802 Spinal stenosis, cervical region: Secondary | ICD-10-CM | POA: Diagnosis not present

## 2020-02-09 DIAGNOSIS — E46 Unspecified protein-calorie malnutrition: Secondary | ICD-10-CM | POA: Diagnosis not present

## 2020-02-09 DIAGNOSIS — R109 Unspecified abdominal pain: Secondary | ICD-10-CM | POA: Diagnosis not present

## 2020-03-16 ENCOUNTER — Encounter: Payer: Self-pay | Admitting: Registered"

## 2020-03-16 ENCOUNTER — Other Ambulatory Visit: Payer: Self-pay

## 2020-03-16 ENCOUNTER — Encounter: Payer: Medicare Other | Attending: Internal Medicine | Admitting: Registered"

## 2020-03-16 DIAGNOSIS — I10 Essential (primary) hypertension: Secondary | ICD-10-CM | POA: Diagnosis not present

## 2020-03-16 DIAGNOSIS — Z713 Dietary counseling and surveillance: Secondary | ICD-10-CM | POA: Diagnosis not present

## 2020-03-16 DIAGNOSIS — R6889 Other general symptoms and signs: Secondary | ICD-10-CM | POA: Diagnosis not present

## 2020-03-16 DIAGNOSIS — F509 Eating disorder, unspecified: Secondary | ICD-10-CM | POA: Insufficient documentation

## 2020-03-16 NOTE — Patient Instructions (Addendum)
-   Aim for 3 Ensures a day.  - Try to have at least 1 meal with family.   - Check into mental health services at [facility] and [facility] about mental health coverage.

## 2020-03-16 NOTE — Progress Notes (Signed)
Appointment start time: 11:08  Appointment end time: 12:05  Patient was seen on 03/16/2020 for nutrition counseling pertaining to disordered eating  Primary care provider: Emi Belfast, MD Therapist: no  ROI: N/A Any other medical team members: Dr. Kenton Kingfisher (knee)   Assessment  Pt arrives stating she is drinking 1 Ensure Plus a day; prefers vanilla and pecan flavors. Eats meals alone.  States she feels sick on the stomach when she eats and drinks anything. States this deters her from eating and drinking. States she forces the Ensure down. Reports she does not have energy to stand up to wash dishes or cook. States she is tired all the time.   States she has dentures and they are giving her problems. States she was having appointments over period of 3 years and still unable to get them fixed. States she chooses not to eat meat although she likes certain food especially meat due to challenges with dentures.   States she has lost a lot of weight in the last 2 months.   Reports challenges with sleeping; sleeping about 3 hours/night. States she thinks about things she doesn't want to think about.    Widowed. States husband passed away a long time ago, possibly in 1979.  States 2 of her children live nearby, one in [place] and one in [place]. States she sees family daily or almost daily.    Growth Metrics: Goal rate of weight gain:  0.5-1.0 lb/week  Eating history: Length of time: 6 months Previous treatments: none Goals for RD meetings: improve dizziness/lightheadedness, improve focus/concetration, and improve chest pain/heart racing  Weight history:  Highest weight: 184   Lowest weight: 160 How has weight changed in the past year: weight loss  Medical Information:  Changes in hair, skin, nails since ED started: no, no, no Chewing/swallowing difficulties: yes, challenges with chewing related to dentures Reflux or heartburn: no Trouble with teeth: yes, has challenges with  dentures Constipation, diarrhea: no, has BM 1-2x/week Dizziness/lightheadedness: yes, daily Headaches/body aches: body aches Heart racing/chest pain: yes Mood: tired Sleep: 3 hours/night. Challenging to fall asleep Focus/concentration: yes Cold intolerance: no Vision changes: dry eyes  Mental health diagnosis: anxiety, depression   Dietary assessment: A typical day consists of 2 meals and 1 snacks  Safe foods include: broccoli stew, pork chop, stewed potatoes Avoided foods include: meat  24 hour recall:  B: Ensure Plus S L: 3 oz pork chop S: D: a few stewed potatoes (onions + margarine) S  Beverages: Ensure, water (16 oz)  What Methods Do You Use To Control Your Weight (Compensatory behaviors)?  none  Estimated energy intake: ~600 kcal  Estimated energy needs: 1600 kcal 200 g CHO 80 g pro 53 g fat  Nutrition Diagnosis: NB-1.5 Disordered eating pattern As related to skipping meals.  As evidenced by dietary recall.  Intervention/Goals: Pt was educated and counseled on eating to nourish the body, signs/symptoms of not being adequately nourished, and ways to increase nourishment. Discussed metabolism. Discussed potentially feeling bloated, gastroparesis, abdominal distention, and feelings of fullness when increasing intake. Discussed role of mental health provider and importance of seeking care to have dentures properly fitted. Pt was in agreement with goals listed.  Goals: - Aim for 3 Ensures a day. - Try to have at least 1 meal with family.  - Check into mental health services at [facility] and [facility] about mental health coverage.   Meal plan:    2 meals  1 snacks  Monitoring and Evaluation: Patient will follow up in 2 weeks.

## 2020-03-30 ENCOUNTER — Other Ambulatory Visit: Payer: Self-pay

## 2020-03-30 ENCOUNTER — Encounter: Payer: Self-pay | Admitting: Registered"

## 2020-03-30 ENCOUNTER — Encounter: Payer: Medicare Other | Admitting: Registered"

## 2020-03-30 DIAGNOSIS — R6889 Other general symptoms and signs: Secondary | ICD-10-CM | POA: Diagnosis not present

## 2020-03-30 DIAGNOSIS — E1122 Type 2 diabetes mellitus with diabetic chronic kidney disease: Secondary | ICD-10-CM | POA: Diagnosis not present

## 2020-03-30 DIAGNOSIS — Z713 Dietary counseling and surveillance: Secondary | ICD-10-CM | POA: Diagnosis not present

## 2020-03-30 DIAGNOSIS — N183 Chronic kidney disease, stage 3 unspecified: Secondary | ICD-10-CM | POA: Diagnosis not present

## 2020-03-30 DIAGNOSIS — E7849 Other hyperlipidemia: Secondary | ICD-10-CM | POA: Diagnosis not present

## 2020-03-30 DIAGNOSIS — M4802 Spinal stenosis, cervical region: Secondary | ICD-10-CM | POA: Diagnosis not present

## 2020-03-30 DIAGNOSIS — I129 Hypertensive chronic kidney disease with stage 1 through stage 4 chronic kidney disease, or unspecified chronic kidney disease: Secondary | ICD-10-CM | POA: Diagnosis not present

## 2020-03-30 DIAGNOSIS — I10 Essential (primary) hypertension: Secondary | ICD-10-CM | POA: Diagnosis not present

## 2020-03-30 NOTE — Patient Instructions (Addendum)
-   Try to have low-fat yogurt + fruit + toast as breakfast option.   - Have [place] sandwich + fruit as lunch option.  - Dinner can be rotisserie chicken + Brewing technologist mashed potatoes + vegetables  - Purchase Carn[employer] Breakfast Essentials powder to add to a glass of milk. Drink once/day in addition to 3 meals/day.   - Purchase 1% milk for shake mixture.

## 2020-03-30 NOTE — Progress Notes (Signed)
Appointment start time: 3:00  Appointment end time: 3:45  Patient was seen on 03/30/2020 for nutrition counseling pertaining to disordered eating  Primary care provider: Emi Belfast, MD Therapist: no  ROI: N/A Any other medical team members: Dr. Kenton Kingfisher (knee)   Assessment  States she feels better. Reports she has been eating more. Reports stomach starts hurting daily around 5 am and will hurt until 11-12 pm; states she eats something and will stop hurting sometimes. States she feels like she has more energy when drinking Ensure Plus but runs out of it quick due to the expenses.   Reports mostly preparing foods quickly by frying them. States she can cook using other methods but does not have the energy.   Previous appts: Pt is drinking 1 Ensure Plus a day; prefers vanilla and pecan flavors. Eats meals alone. States she has dentures and they are giving her problems. States she was having appointments over period of 3 years and still unable to get them fixed. States she chooses not to eat meat although she likes certain food especially meat due to challenges with dentures.    Growth Metrics: Goal rate of weight gain:  0.5-1.0 lb/week  Eating history: Length of time: 6 months Previous treatments: none Goals for RD meetings: improve dizziness/lightheadedness, improve focus/concetration, and improve chest pain/heart racing  Weight history:  Highest weight: 184   Lowest weight: 160 How has weight changed in the past year: weight loss  Medical Information:  Changes in hair, skin, nails since ED started: no, no, no Chewing/swallowing difficulties: yes, challenges with chewing related to dentures Reflux or heartburn: no Trouble with teeth: yes, has challenges with dentures Constipation, diarrhea: no, has BM 1-2x/week Dizziness/lightheadedness: yes, daily Headaches/body aches: body aches Heart racing/chest pain: yes Mood: tired Sleep: 3 hours/night. Challenging to fall  asleep Focus/concentration: yes Cold intolerance: no Vision changes: dry eyes  Mental health diagnosis: anxiety, depression   Dietary assessment: A typical day consists of 2 meals and 1 snacks  Safe foods include: broccoli stew, pork chop, stewed potatoes Avoided foods include: meat  24 hour recall:  B: 1/4 plate of breakfast sausage + 1/4 egg + 1 slice of bread or Ensure Plus S L: Manwich (with bun) or 3-4 forks of noodles    S: D: 1/8 plate of stewed corn (oil, margarine, sugar, salt, pepper) + 3 slices of fried fat back meat S  Beverages: Ensure, water (16 oz)  What Methods Do You Use To Control Your Weight (Compensatory behaviors)?  none  Estimated energy intake: ~800 kcal  Estimated energy needs: 1600 kcal 200 g CHO 80 g pro 53 g fat  Nutrition Diagnosis: NB-1.5 Disordered eating pattern As related to skipping meals.  As evidenced by dietary recall.  Intervention/Goals: Pt was educated and counseled on eating to nourish the body and ways to increase nourishment. Discussed other methods of preparing food to help reduce fat intake and are convenient. Registered pt with Ensure to receive coupons in the future via email, per pt's request. Pt was in agreement with goals listed.  Goals: - Try to have low-fat yogurt + fruit + toast as breakfast option.  - Have [place] sandwich + fruit as lunch option. - Dinner can be rotisserie chicken + Brewing technologist mashed potatoes + vegetables - Purchase Carnation Breakfast Essentials powder to add to a glass of milk. Drink once/day in addition to 3 meals/day.  - Purchase 1% milk for shake mixture.      Meal plan:  3 meals    1 snacks  Monitoring and Evaluation: Patient will follow up in 3 weeks.

## 2020-04-04 DIAGNOSIS — J019 Acute sinusitis, unspecified: Secondary | ICD-10-CM | POA: Diagnosis not present

## 2020-04-05 DIAGNOSIS — E785 Hyperlipidemia, unspecified: Secondary | ICD-10-CM | POA: Diagnosis not present

## 2020-04-05 DIAGNOSIS — Z712 Person consulting for explanation of examination or test findings: Secondary | ICD-10-CM | POA: Diagnosis not present

## 2020-04-05 DIAGNOSIS — E1169 Type 2 diabetes mellitus with other specified complication: Secondary | ICD-10-CM | POA: Diagnosis not present

## 2020-04-05 DIAGNOSIS — E1122 Type 2 diabetes mellitus with diabetic chronic kidney disease: Secondary | ICD-10-CM | POA: Diagnosis not present

## 2020-04-05 DIAGNOSIS — Z9049 Acquired absence of other specified parts of digestive tract: Secondary | ICD-10-CM | POA: Diagnosis not present

## 2020-04-08 DIAGNOSIS — E1169 Type 2 diabetes mellitus with other specified complication: Secondary | ICD-10-CM | POA: Diagnosis not present

## 2020-04-08 DIAGNOSIS — Z712 Person consulting for explanation of examination or test findings: Secondary | ICD-10-CM | POA: Diagnosis not present

## 2020-04-08 DIAGNOSIS — E785 Hyperlipidemia, unspecified: Secondary | ICD-10-CM | POA: Diagnosis not present

## 2020-04-08 DIAGNOSIS — E1122 Type 2 diabetes mellitus with diabetic chronic kidney disease: Secondary | ICD-10-CM | POA: Diagnosis not present

## 2020-04-08 DIAGNOSIS — Z9049 Acquired absence of other specified parts of digestive tract: Secondary | ICD-10-CM | POA: Diagnosis not present

## 2020-04-12 ENCOUNTER — Other Ambulatory Visit (HOSPITAL_COMMUNITY): Payer: Self-pay | Admitting: Internal Medicine

## 2020-04-12 DIAGNOSIS — Z1231 Encounter for screening mammogram for malignant neoplasm of breast: Secondary | ICD-10-CM

## 2020-04-13 DIAGNOSIS — I129 Hypertensive chronic kidney disease with stage 1 through stage 4 chronic kidney disease, or unspecified chronic kidney disease: Secondary | ICD-10-CM | POA: Diagnosis not present

## 2020-04-13 DIAGNOSIS — E782 Mixed hyperlipidemia: Secondary | ICD-10-CM | POA: Diagnosis not present

## 2020-04-13 DIAGNOSIS — E1122 Type 2 diabetes mellitus with diabetic chronic kidney disease: Secondary | ICD-10-CM | POA: Diagnosis not present

## 2020-04-13 DIAGNOSIS — D696 Thrombocytopenia, unspecified: Secondary | ICD-10-CM | POA: Diagnosis not present

## 2020-04-13 DIAGNOSIS — K219 Gastro-esophageal reflux disease without esophagitis: Secondary | ICD-10-CM | POA: Diagnosis not present

## 2020-04-19 ENCOUNTER — Ambulatory Visit: Payer: Medicare Other | Admitting: Registered"

## 2020-04-24 ENCOUNTER — Other Ambulatory Visit: Payer: Self-pay

## 2020-04-24 ENCOUNTER — Emergency Department (HOSPITAL_COMMUNITY)
Admission: EM | Admit: 2020-04-24 | Discharge: 2020-04-24 | Disposition: A | Discharge status: Home / Self Care | Payer: Medicare Other | Attending: Emergency Medicine | Admitting: Emergency Medicine

## 2020-04-24 ENCOUNTER — Encounter (HOSPITAL_COMMUNITY): Payer: Self-pay | Admitting: Emergency Medicine

## 2020-04-24 ENCOUNTER — Emergency Department (HOSPITAL_COMMUNITY): Payer: Medicare Other

## 2020-04-24 DIAGNOSIS — Z79899 Other long term (current) drug therapy: Secondary | ICD-10-CM | POA: Insufficient documentation

## 2020-04-24 DIAGNOSIS — R1084 Generalized abdominal pain: Secondary | ICD-10-CM | POA: Diagnosis not present

## 2020-04-24 DIAGNOSIS — R109 Unspecified abdominal pain: Secondary | ICD-10-CM | POA: Diagnosis not present

## 2020-04-24 DIAGNOSIS — I700 Atherosclerosis of aorta: Secondary | ICD-10-CM | POA: Diagnosis not present

## 2020-04-24 DIAGNOSIS — Z20822 Contact with and (suspected) exposure to covid-19: Secondary | ICD-10-CM | POA: Insufficient documentation

## 2020-04-24 DIAGNOSIS — R6889 Other general symptoms and signs: Secondary | ICD-10-CM | POA: Diagnosis not present

## 2020-04-24 DIAGNOSIS — R059 Cough, unspecified: Secondary | ICD-10-CM | POA: Diagnosis not present

## 2020-04-24 DIAGNOSIS — R079 Chest pain, unspecified: Secondary | ICD-10-CM | POA: Diagnosis not present

## 2020-04-24 DIAGNOSIS — I10 Essential (primary) hypertension: Secondary | ICD-10-CM | POA: Insufficient documentation

## 2020-04-24 DIAGNOSIS — K219 Gastro-esophageal reflux disease without esophagitis: Secondary | ICD-10-CM | POA: Diagnosis not present

## 2020-04-24 DIAGNOSIS — Z955 Presence of coronary angioplasty implant and graft: Secondary | ICD-10-CM | POA: Insufficient documentation

## 2020-04-24 DIAGNOSIS — N281 Cyst of kidney, acquired: Secondary | ICD-10-CM | POA: Diagnosis not present

## 2020-04-24 DIAGNOSIS — Z9049 Acquired absence of other specified parts of digestive tract: Secondary | ICD-10-CM | POA: Diagnosis not present

## 2020-04-24 DIAGNOSIS — R069 Unspecified abnormalities of breathing: Secondary | ICD-10-CM | POA: Diagnosis not present

## 2020-04-24 DIAGNOSIS — R0602 Shortness of breath: Secondary | ICD-10-CM | POA: Diagnosis not present

## 2020-04-24 DIAGNOSIS — Z743 Need for continuous supervision: Secondary | ICD-10-CM | POA: Diagnosis not present

## 2020-04-24 LAB — CBC WITH DIFFERENTIAL/PLATELET
Abs Immature Granulocytes: 0.02 10*3/uL (ref 0.00–0.07)
Basophils Absolute: 0.1 10*3/uL (ref 0.0–0.1)
Basophils Relative: 1 %
Eosinophils Absolute: 0.1 10*3/uL (ref 0.0–0.5)
Eosinophils Relative: 2 %
HCT: 40.1 % (ref 36.0–46.0)
Hemoglobin: 13.3 g/dL (ref 12.0–15.0)
Immature Granulocytes: 0 %
Lymphocytes Relative: 32 %
Lymphs Abs: 1.9 10*3/uL (ref 0.7–4.0)
MCH: 30.5 pg (ref 26.0–34.0)
MCHC: 33.2 g/dL (ref 30.0–36.0)
MCV: 92 fL (ref 80.0–100.0)
Monocytes Absolute: 0.4 10*3/uL (ref 0.1–1.0)
Monocytes Relative: 7 %
Neutro Abs: 3.3 10*3/uL (ref 1.7–7.7)
Neutrophils Relative %: 58 %
Platelets: 408 10*3/uL — ABNORMAL HIGH (ref 150–400)
RBC: 4.36 MIL/uL (ref 3.87–5.11)
RDW: 12.9 % (ref 11.5–15.5)
WBC: 5.8 10*3/uL (ref 4.0–10.5)
nRBC: 0 % (ref 0.0–0.2)

## 2020-04-24 LAB — HEPATIC FUNCTION PANEL
ALT: 26 U/L (ref 0–44)
AST: 24 U/L (ref 15–41)
Albumin: 4 g/dL (ref 3.5–5.0)
Alkaline Phosphatase: 59 U/L (ref 38–126)
Bilirubin, Direct: 0.1 mg/dL (ref 0.0–0.2)
Indirect Bilirubin: 0.8 mg/dL (ref 0.3–0.9)
Total Bilirubin: 0.9 mg/dL (ref 0.3–1.2)
Total Protein: 7.2 g/dL (ref 6.5–8.1)

## 2020-04-24 LAB — BRAIN NATRIURETIC PEPTIDE: B Natriuretic Peptide: 112 pg/mL — ABNORMAL HIGH (ref 0.0–100.0)

## 2020-04-24 LAB — BASIC METABOLIC PANEL
Anion gap: 10 (ref 5–15)
BUN: 9 mg/dL (ref 8–23)
CO2: 19 mmol/L — ABNORMAL LOW (ref 22–32)
Calcium: 9.7 mg/dL (ref 8.9–10.3)
Chloride: 108 mmol/L (ref 98–111)
Creatinine, Ser: 1.01 mg/dL — ABNORMAL HIGH (ref 0.44–1.00)
GFR, Estimated: 57 mL/min — ABNORMAL LOW (ref >60)
Glucose, Bld: 117 mg/dL — ABNORMAL HIGH (ref 70–99)
Potassium: 3.4 mmol/L — ABNORMAL LOW (ref 3.5–5.1)
Sodium: 137 mmol/L (ref 135–145)

## 2020-04-24 LAB — RESPIRATORY PANEL BY RT PCR (FLU A&B, COVID)
Influenza A by PCR: NEGATIVE
Influenza B by PCR: NEGATIVE
SARS Coronavirus 2 by RT PCR: NEGATIVE

## 2020-04-24 LAB — LIPASE, BLOOD: Lipase: 23 U/L (ref 11–51)

## 2020-04-24 LAB — TROPONIN I (HIGH SENSITIVITY)
Troponin I (High Sensitivity): 3 ng/L (ref <18)
Troponin I (High Sensitivity): 3 ng/L (ref <18)

## 2020-04-24 MED ORDER — IOHEXOL 350 MG/ML SOLN
100.0000 mL | Freq: Once | INTRAVENOUS | Status: AC | PRN
  Administered 2020-04-24: 100 mL via INTRAVENOUS

## 2020-04-24 NOTE — ED Provider Notes (Signed)
Patient inherited at change of shift, has improved shortness of breath and states that she has had some intermittent shortness of breath over time for which albuterol helps but she seems to still have waxing and waning symptoms.  Thankfully there was no findings on CT scan of the chest abdomen or pelvis today to suggest a pathologic cause of her symptoms, her vital signs at this time show a pulse of 72 a respirations of 20, oxygen of 99% temperature of 97.8 F and a blood pressure of 146/76.  She is stable for discharge and she understands indications for return, she has a local family doctor with whom she can follow-up.   Noemi Chapel, MD 04/24/20 [phone]

## 2020-04-24 NOTE — Discharge Instructions (Signed)
Your testing today was reassuring, there is no signs of anything wrong in your lungs, your oxygen level has been normal, continue to use your albuterol inhaler which her doctor has given you for shortness of breath and follow-up in the next 2 to 3 days.  Please come back to the hospital for severe or worsening symptoms.

## 2020-04-24 NOTE — ED Provider Notes (Addendum)
[facility] EMERGENCY DEPARTMENT Provider Note   CSN: [id number] Arrival date & time: 04/24/20  [id number]     History Chief Complaint  Patient presents with  . Shortness of Breath    Audrey Johnson is a 79 y.o. female.  Patient presents to the emergency department for evaluation of shortness of breath.  Patient reports that she has been having difficulty with breathing for the last 3 days.  She has had a slight cough.  She has not had any fever.  Patient denies chest pain.  She reports that she has some breathing problems at times and was prescribed an inhaler in the past.  She has tried her inhaler over the last few days but it has not helped.        Past Medical History:  Diagnosis Date  . Anxiety   . Arthritis    in back .   Marland Kitchen Borderline diabetes   . Chronic abdominal pain   . Chronic back pain   . Chronic constipation   . Chronic nausea   . Depression   . GERD (gastroesophageal reflux disease)   . High cholesterol   . Hypertension   . Noncompliance with medication regimen   . Pneumonia   . Poor historian   . Shortness of breath     even walking causes sob...     Patient Active Problem List   Diagnosis Date Noted  . Chronic cholecystitis 02/04/2018  . Biliary dyskinesia 01/21/2018  . Urinary tract infection symptoms 03/11/2017  . GERD (gastroesophageal reflux disease) 07/20/2013  . Abnormal LFTs 07/20/2013  . Constipation 07/20/2013  . Depression 04/24/2013  . Chronic abdominal pain 12/04/2012  . Dyspnea 05/19/2012  . Benign hypertension 05/19/2012  . Hyperlipidemia 05/19/2012    Past Surgical History:  Procedure Laterality Date  . ABDOMINAL HYSTERECTOMY    . BREAST SURGERY  unknown (many yrs)   excision  of lump right breast  was per pt "nothing"   . CARDIAC CATHETERIZATION    . CATARACT EXTRACTION W/PHACO Left 02/21/2015   Procedure: CATARACT EXTRACTION PHACO AND INTRAOCULAR LENS PLACEMENT LEFT EYE CDE=5.86;  Surgeon: Tonny Branch, MD;  Location:  [facility];  Service: Ophthalmology;  Laterality: Left;  . CATARACT EXTRACTION W/PHACO Right 03/14/2015   Procedure: CATARACT EXTRACTION PHACO AND INTRAOCULAR LENS PLACEMENT ([place]);  Surgeon: Tonny Branch, MD;  Location: [facility];  Service: Ophthalmology;  Laterality: Right;  CDE:6.36  . CHOLECYSTECTOMY N/A 01/24/2018   Procedure: LAPAROSCOPIC CHOLECYSTECTOMY;  Surgeon: Virl Cagey, MD;  Location: [facility];  Service: General;  Laterality: N/A;  . COLONOSCOPY  04/16/2007   RMR: Normal rectum and colon  . COLONOSCOPY WITH ESOPHAGOGASTRODUODENOSCOPY (EGD) N/A 01/08/2013   HUT:MLYY erosive reflux esophagitis. Negative H.pylori. Small hiatal hernia-s/p bx (gastritis)-TCS/Colonic polyp-hyperplastic  . DILATION AND CURETTAGE OF UTERUS    . ESOPHAGOGASTRODUODENOSCOPY (EGD) WITH PROPOFOL N/A 02/14/2017   DR. Rourk: normal esophagus, small hiatal hernia  . LEFT HEART CATHETERIZATION WITH CORONARY ANGIOGRAM N/A 06/05/2012   Procedure: LEFT HEART CATHETERIZATION WITH CORONARY ANGIOGRAM;  Surgeon: Hillary Bow, MD;  Location: [facility] CATH LAB;  Service: Cardiovascular;  Laterality: N/A;  . TOE SURGERY Right    5th toe, not sure what was wrong with it     OB History   No obstetric history on file.     Family History  Problem Relation Age of Onset  . Lung disease Mother   . Diabetes Mother   . Cancer Father   . Heart disease Father   .  Heart disease Sister   . Stroke Sister   . Cancer Brother   . Colon cancer Neg Hx     Social History   Tobacco Use  . Smoking status: Never Smoker  . Smokeless tobacco: Never Used  . Tobacco comment: Never smoked  Vaping Use  . Vaping Use: Never used  Substance Use Topics  . Alcohol use: No  . Drug use: No    Home Medications Prior to Admission medications   Medication Sig Start Date End Date Taking? Authorizing Provider  atorvastatin (LIPITOR) 20 MG tablet Take 20 mg by mouth at bedtime.    Celene Squibb, MD  BYSTOLIC 5 MG tablet Take 5 mg by mouth daily.   09/15/17   [provider]  gabapentin (NEURONTIN) 300 MG capsule Take 1 capsule (300 mg total) by mouth 3 (three) times daily. 07/13/19   Carole Civil, MD  lisinopril (PRINIVIL,ZESTRIL) 10 MG tablet Take 10 mg by mouth daily.    [provider]  megestrol (MEGACE) 40 MG/ML suspension Take 10 mLs by mouth daily. 10/14/17   [provider]    Allergies    Patient has no known allergies.  Review of Systems   Review of Systems  Respiratory: Positive for shortness of breath.   All other systems reviewed and are negative.   Physical Exam Updated Vital Signs BP (!) 150/73 (BP Location: Right Arm)   Pulse 79   Temp 97.8 F (36.6 C) (Oral)   Resp (!) 30   Ht 5\' 4"  (1.626 m)   Wt 72.6 kg   SpO2 100%   BMI 27.47 kg/m   Physical Exam Vitals and nursing note reviewed.  Constitutional:      General: She is not in acute distress.    Appearance: Normal appearance. She is well-developed.  HENT:     Head: Normocephalic and atraumatic.     Right Ear: Hearing normal.     Left Ear: Hearing normal.     Nose: Nose normal.  Eyes:     Conjunctiva/sclera: Conjunctivae normal.     Pupils: Pupils are equal, round, and reactive to light.  Cardiovascular:     Rate and Rhythm: Regular rhythm.     Heart sounds: S1 normal and S2 normal. No murmur heard.  No friction rub. No gallop.   Pulmonary:     Effort: Pulmonary effort is normal. Tachypnea present. No respiratory distress.     Breath sounds: Normal breath sounds.  Chest:     Chest wall: No tenderness.  Abdominal:     General: Bowel sounds are normal.     Palpations: Abdomen is soft.     Tenderness: There is abdominal tenderness (generalized). There is no guarding or rebound. Negative signs include Murphy's sign and McBurney's sign.     Hernia: No hernia is present.  Musculoskeletal:        General: Normal range of motion.     Cervical back: Normal range of motion and neck supple.  Skin:    General: Skin  is warm and dry.     Findings: No rash.  Neurological:     Mental Status: She is alert and oriented to person, place, and time.     GCS: GCS eye subscore is 4. GCS verbal subscore is 5. GCS motor subscore is 6.     Cranial Nerves: No cranial nerve deficit.     Sensory: No sensory deficit.     Coordination: Coordination normal.  Psychiatric:  Mood and Affect: Mood is anxious. Affect is tearful.        Speech: Speech normal.        Behavior: Behavior normal.        Thought Content: Thought content normal.     ED Results / Procedures / Treatments   Labs (all labs ordered are listed, but only abnormal results are displayed) Labs Reviewed  RESPIRATORY PANEL BY RT PCR (FLU A&B, COVID)  CBC WITH DIFFERENTIAL/PLATELET  BASIC METABOLIC PANEL  BRAIN NATRIURETIC PEPTIDE  TROPONIN I (HIGH SENSITIVITY)    EKG EKG Interpretation  Date/Time:  Sunday April 24 2020 06:19:45 EDT Ventricular Rate:  75 PR Interval:    QRS Duration: 78 QT Interval:  382 QTC Calculation: 430 R Axis:   56 Text Interpretation: Sinus rhythm Low voltage, precordial leads No significant change since last tracing Confirmed by Orpah Greek [phone]) on 04/24/2020 6:26:24 AM   Radiology No results found.  Procedures Procedures (including critical care time)  Medications Ordered in ED Medications - No data to display  ED Course  I have reviewed the triage vital signs and the nursing notes.  Pertinent labs & imaging results that were available during my care of the patient were reviewed by me and considered in my medical decision making (see chart for details).    MDM Rules/Calculators/A&P                          Patient presents to the emergency department with complaints of shortness of breath.  Patient reports that she has been experiencing shortness of breath for the last 3 days.  No history of congestive heart failure.  She is not having any associated chest pain.  EKG unchanged from  previous.  Patient does not have any history of DVT or PE.  She is not tachycardic and there is no associated hypoxia.  Patient does appear very anxious and is tearful.  She has been told that her shortness of breath is due to panic attacks in the past.  Will perform work-up including chest x-ray, cardiac enzymes, BNP.  As her lungs are clear to auscultation, suspect that there is an anxiety component.  She does report having received 2 doses of Covid vaccination.  She does have some abdominal tenderness.  She reports that this happens "sometimes".  She was not feeling pain when she came in but did endorse tenderness on exam.  We will plan for abdominal CT.  Will sign out to oncoming ER physician to follow-up work-up.  Final Clinical Impression(s) / ED Diagnoses Final diagnoses:  SOB (shortness of breath)  Generalized abdominal pain    Rx / DC Orders ED Discharge Orders    None       Cleburn Maiolo, Gwenyth Allegra, MD 04/24/20 0645    Orpah Greek, MD 04/24/20 [phone]

## 2020-04-24 NOTE — ED Triage Notes (Signed)
[facility] - pt c/o SOB x 3 days. Was seen at PCP for abdominal pain and SOB, was diagnosed with panic attacks.

## 2020-04-29 DIAGNOSIS — N189 Chronic kidney disease, unspecified: Secondary | ICD-10-CM | POA: Diagnosis not present

## 2020-04-29 DIAGNOSIS — I129 Hypertensive chronic kidney disease with stage 1 through stage 4 chronic kidney disease, or unspecified chronic kidney disease: Secondary | ICD-10-CM | POA: Diagnosis not present

## 2020-04-29 DIAGNOSIS — K219 Gastro-esophageal reflux disease without esophagitis: Secondary | ICD-10-CM | POA: Diagnosis not present

## 2020-04-29 DIAGNOSIS — E1122 Type 2 diabetes mellitus with diabetic chronic kidney disease: Secondary | ICD-10-CM | POA: Diagnosis not present

## 2020-04-29 DIAGNOSIS — E782 Mixed hyperlipidemia: Secondary | ICD-10-CM | POA: Diagnosis not present

## 2020-05-08 ENCOUNTER — Encounter: Payer: Self-pay | Admitting: Cardiology

## 2020-05-08 NOTE — Progress Notes (Signed)
.    Cardiology Office Note  Date: 05/09/2020   ID: Majestic, Molony 01-Jul-1941, MRN [id number]  PCP:  Celene Squibb, MD  Cardiologist:  Rozann Lesches, MD Electrophysiologist:  None   Chief Complaint  Patient presents with  . Shortness of Breath    History of Present Illness: YARLIN BREISCH is a 79 y.o. female referred for cardiology consultation by Dr. Nevada Crane for the evaluation of shortness of breath.  She describes a longstanding history of breathlessness and intermittent feeling of chest discomfort.  This has been going on for months to years.  She feels like it is getting worse.  She does not report any cough or wheezing.  Work-up in the past  was reassuring from a cardiac perspective.  She was seen in the ER recently on October 24 reporting shortness of breath. CTA of chest, abdomen, and pelvis did not report any significant acute findings with aortic atherosclerosis incidentally noted.  High-sensitivity troponin I levels were normal, SARS coronavirus 2 test negative, BNP 112.   Chart review finds previous follow-up with Dr. Lia Foyer, last seen in 2013.  Cardiac catheterization performed in December 2013 revealed no significant obstructive CAD as noted below.  She does not report any obvious history of chronic lung disease, she is a non-smoker.   Past Medical History:  Diagnosis Date  . Anxiety   . Arthritis   . Borderline diabetes   . Chronic abdominal pain   . Chronic back pain   . Chronic constipation   . Chronic nausea   . Depression   . Essential hypertension   . GERD (gastroesophageal reflux disease)   . History of pneumonia   . Hyperlipidemia   . Noncompliance with medication regimen     Past Surgical History:  Procedure Laterality Date  . ABDOMINAL HYSTERECTOMY    . BREAST SURGERY  unknown (many yrs)   excision  of lump right breast  was per pt "nothing"   . CARDIAC CATHETERIZATION    . CATARACT EXTRACTION W/PHACO Left 02/21/2015   Procedure:  CATARACT EXTRACTION PHACO AND INTRAOCULAR LENS PLACEMENT LEFT EYE CDE=5.86;  Surgeon: Tonny Branch, MD;  Location: [facility];  Service: Ophthalmology;  Laterality: Left;  . CATARACT EXTRACTION W/PHACO Right 03/14/2015   Procedure: CATARACT EXTRACTION PHACO AND INTRAOCULAR LENS PLACEMENT ([place]);  Surgeon: Tonny Branch, MD;  Location: [facility];  Service: Ophthalmology;  Laterality: Right;  CDE:6.36  . CHOLECYSTECTOMY N/A 01/24/2018   Procedure: LAPAROSCOPIC CHOLECYSTECTOMY;  Surgeon: Virl Cagey, MD;  Location: [facility];  Service: General;  Laterality: N/A;  . COLONOSCOPY  04/16/2007   RMR: Normal rectum and colon  . COLONOSCOPY WITH ESOPHAGOGASTRODUODENOSCOPY (EGD) N/A 01/08/2013   RKY:[facility] erosive reflux esophagitis. Negative H.pylori. Small hiatal hernia-s/p bx (gastritis)-TCS/Colonic polyp-hyperplastic  . DILATION AND CURETTAGE OF UTERUS    . ESOPHAGOGASTRODUODENOSCOPY (EGD) WITH PROPOFOL N/A 02/14/2017   DR. Rourk: normal esophagus, small hiatal hernia  . LEFT HEART CATHETERIZATION WITH CORONARY ANGIOGRAM N/A 06/05/2012   Procedure: LEFT HEART CATHETERIZATION WITH CORONARY ANGIOGRAM;  Surgeon: Hillary Bow, MD;  Location: [facility] C[facility] LAB;  Service: Cardiovascular;  Laterality: N/A;  . TOE SURGERY Right    5th toe, not sure what was wrong with it    Current Outpatient Medications  Medication Sig Dispense Refill  . atorvastatin (LIPITOR) 20 MG tablet Take 20 mg by mouth at bedtime.    Marland Kitchen BYSTOLIC 10 MG tablet Take 10 mg by mouth daily.    . calcium-vitamin D (OSCAL WITH  D) 500-200 MG-UNIT tablet Take 1 tablet by mouth.    . escitalopram (LEXAPRO) 10 MG tablet Take 10 mg by mouth daily.    Marland Kitchen gabapentin (NEURONTIN) 100 MG capsule Take 100 mg by mouth 3 (three) times daily.    Marland Kitchen lisinopril (PRINIVIL,ZESTRIL) 10 MG tablet Take 10 mg by mouth daily.    Marland Kitchen loratadine (CLARITIN) 10 MG tablet Take 10 mg by mouth daily.    . megestrol (MEGACE) 40 MG/ML suspension Take 10 mLs by mouth daily.  3  .  pantoprazole (PROTONIX) 20 MG tablet Take 20 mg by mouth daily.     No current facility-administered medications for this visit.   Allergies:  Patient has no known allergies.   Social History: The patient  reports that she has never smoked. She has never used smokeless tobacco. She reports that she does not drink alcohol and does not use drugs.   Family History: The patient's family history includes Cancer in her brother and father; Diabetes in her mother; Heart disease in her father and sister; Lung disease in her mother; Stroke in her sister.   ROS: No palpitations or syncope.  Physical Exam: VS:  BP 118/60 (BP Location: Left Arm)   Pulse 84   Ht 5\' 4"  (1.626 m)   Wt 156 lb (70.8 kg)   SpO2 95%   BMI 26.78 kg/m , BMI Body mass index is 26.78 kg/m.  Wt Readings from Last 3 Encounters:  05/09/20 156 lb (70.8 kg)  04/24/20 160 lb 0.9 oz (72.6 kg)  01/06/20 160 lb (72.6 kg)    General: Patient appears comfortable at rest. HEENT: Conjunctiva and lids normal, wearing a mask. Neck: Supple, no elevated JVP or carotid bruits, no thyromegaly. Lungs: Clear to auscultation, nonlabored breathing at rest. Cardiac: Regular rate and rhythm, no S3, soft systolic murmur, no pericardial rub. Abdomen: Soft, nontender, bowel sounds present. Extremities: No pitting edema, distal pulses 2+. Skin: Warm and dry. Musculoskeletal: No kyphosis. Neuropsychiatric: Alert and oriented x3, affect grossly appropriate.  ECG:  An ECG dated 04/24/2020 was personally reviewed today and demonstrated:  Sinus rhythm with lead motion artifact.  Recent Labwork: 04/24/2020: ALT 26; AST 24; B Natriuretic Peptide 112.0; BUN 9; Creatinine, Ser 1.01; Hemoglobin 13.3; Platelets 408; Potassium 3.4; Sodium 137  October 2021: Cholesterol 103, triglycerides 118, HDL 37, LDL 45, hemoglobin A1c 6.3%, TSH 2.78  Other Studies Reviewed Today:  Echocardiogram 05/15/2012: - Left ventricle: The cavity size was normal. Wall  thickness  was normal. Systolic function was normal. The estimated  ejection fraction was in the range of 55% to 60%. Wall  motion was normal; there were no regional wall motion  abnormalities. Doppler parameters are consistent with  abnormal left ventricular relaxation (grade 1 diastolic  dysfunction).  - Aortic valve: There was no stenosis.  - Mitral valve: Trivial regurgitation.  - Left atrium: The atrium was at the upper limits of normal  in size.  - Right ventricle: The cavity size was normal. Systolic  function was normal.  - Tricuspid valve: Peak RV-RA gradient: 48mm Hg (S).  - Pulmonary arteries: PA peak pressure: 28mm Hg (S).  - Inferior vena cava: The vessel was normal in size; the  respirophasic diameter changes were in the normal range (=  50%); findings are consistent with normal central venous  pressure.  Impressions:   - Normal LV size and systolic function with EF 55-60%.  Normal RV size and systolic function. No significant  valvular abnormalities.   Cardiac  catheterization 06/05/2012: Procedural Findings: Hemodynamics:  AO 129/69 (95) LV 121/11              Coronary angiography: Coronary dominance: right  Left mainstem: Large caliber vessel with no calcification and no stenosis  Left anterior descending (LAD): Large caliber vessel that courses to the apex and wraps the apical vessel.  There are multiple areas of tortuosity but no obvious critical blockage.  There is one major diagonal obstruction.   Left circumflex (LCx): Provides two large marginal and a PL system.  No critical obstruction is noted  Right coronary artery (RCA): Large in caliber and supplies a PDA and PLA.  No significant obstruction  Left ventriculography: Left ventricular systolic function is normal, LVEF is estimated at 65%, there is no significant mitral regurgitation   Final Conclusions:   1.  No significant obstruction 2.  Vigorous systolic  function.  Assessment and Plan:  1.  Longstanding history of shortness of breath as well as intermittent chest discomfort.  Cardiac catheterization in 2013 showed no significant, obstructive CAD.  LVEF was normal at that time.  Recent evaluation in the ER did not reveal any acute abnormalities by CT imaging, high-sensitivity troponin I levels were normal, BNP was 112.  Plan is to obtain an echocardiogram to reassess cardiac structure and function, also a Lexiscan Myoview for ischemic evaluation.  2.  Essential hypertension, on Bystolic and lisinopril, blood pressure is well controlled today.  3.  Mixed hyperlipidemia, on Lipitor.  Recent LDL 45.  Medication Adjustments/Labs and Tests Ordered: Current medicines are reviewed at length with the patient today.  Concerns regarding medicines are outlined above.   Tests Ordered: Orders Placed This Encounter  Procedures  . NM Myocar Multi W/Spect W/Wall Motion / EF  . ECHOCARDIOGRAM COMPLETE    Medication Changes: No orders of the defined types were placed in this encounter.   Disposition:  Follow up test results.  Signed, Satira Sark, MD, [facility] 05/09/2020 1:14 PM    [facility] at [facility] [address], [place], [place] [address] Phone: [phone]; Fax: [phone]

## 2020-05-09 ENCOUNTER — Encounter: Payer: Self-pay | Admitting: Cardiology

## 2020-05-09 ENCOUNTER — Ambulatory Visit: Payer: Medicare Other | Admitting: Cardiology

## 2020-05-09 ENCOUNTER — Other Ambulatory Visit: Payer: Self-pay

## 2020-05-09 VITALS — BP 118/60 | HR 84 | Ht 64.0 in | Wt 156.0 lb

## 2020-05-09 DIAGNOSIS — I10 Essential (primary) hypertension: Secondary | ICD-10-CM

## 2020-05-09 DIAGNOSIS — E782 Mixed hyperlipidemia: Secondary | ICD-10-CM | POA: Diagnosis not present

## 2020-05-09 DIAGNOSIS — R0602 Shortness of breath: Secondary | ICD-10-CM | POA: Diagnosis not present

## 2020-05-09 NOTE — Patient Instructions (Signed)
Medication Instructions:  Your physician recommends that you continue on your current medications as directed. Please refer to the Current Medication list given to you today.  *If you need a refill on your cardiac medications before your next appointment, please call your pharmacy*   Lab Work: None today If you have labs (blood work) drawn today and your tests are completely normal, you will receive your results only by: Marland Kitchen MyChart Message (if you have MyChart) OR . A paper copy in the mail If you have any lab test that is abnormal or we need to change your treatment, we will call you to review the results.   Testing/Procedures: Your physician has requested that you have an echocardiogram. Echocardiography is a painless test that uses sound waves to create images of your heart. It provides your doctor with information about the size and shape of your heart and how well your heart's chambers and valves are working. This procedure takes approximately one hour. There are no restrictions for this procedure.  Your physician has requested that you have a lexiscan myoview. For further information please visit [email or web address]. Please follow instruction sheet, as given.     Follow-Up: At [facility], you and your health needs are our priority.  As part of our continuing mission to provide you with exceptional heart care, we have created designated Provider Care Teams.  These Care Teams include your primary Cardiologist (physician) and Advanced Practice Providers (APPs -  Physician Assistants and Nurse Practitioners) who all work together to provide you with the care you need, when you need it.  We recommend signing up for the patient portal called "MyChart".  Sign up information is provided on this After Visit Summary.  MyChart is used to connect with patients for Virtual Visits (Telemedicine).  Patients are able to view lab/test results, encounter notes, upcoming appointments, etc.  Non-urgent  messages can be sent to your provider as well.   To learn more about what you can do with MyChart, go to [email or web address].    Your next appointment:  We will call you with results.     Thank you for choosing [facility] !

## 2020-05-11 DIAGNOSIS — E782 Mixed hyperlipidemia: Secondary | ICD-10-CM | POA: Diagnosis not present

## 2020-05-11 DIAGNOSIS — E1122 Type 2 diabetes mellitus with diabetic chronic kidney disease: Secondary | ICD-10-CM | POA: Diagnosis not present

## 2020-05-11 DIAGNOSIS — I129 Hypertensive chronic kidney disease with stage 1 through stage 4 chronic kidney disease, or unspecified chronic kidney disease: Secondary | ICD-10-CM | POA: Diagnosis not present

## 2020-05-11 DIAGNOSIS — D696 Thrombocytopenia, unspecified: Secondary | ICD-10-CM | POA: Diagnosis not present

## 2020-05-11 DIAGNOSIS — K219 Gastro-esophageal reflux disease without esophagitis: Secondary | ICD-10-CM | POA: Diagnosis not present

## 2020-05-13 ENCOUNTER — Other Ambulatory Visit: Payer: Self-pay

## 2020-05-13 ENCOUNTER — Ambulatory Visit (HOSPITAL_COMMUNITY): Payer: Medicare Other

## 2020-05-13 ENCOUNTER — Encounter (HOSPITAL_BASED_OUTPATIENT_CLINIC_OR_DEPARTMENT_OTHER)
Admission: RE | Admit: 2020-05-13 | Discharge: 2020-05-13 | Disposition: A | Discharge status: Home / Self Care | Payer: Medicare Other | Source: Ambulatory Visit | Attending: Cardiology | Admitting: Cardiology

## 2020-05-13 ENCOUNTER — Ambulatory Visit (HOSPITAL_COMMUNITY)
Admission: RE | Admit: 2020-05-13 | Discharge: 2020-05-13 | Disposition: A | Discharge status: Home / Self Care | Payer: Medicare Other | Source: Ambulatory Visit | Attending: Cardiology | Admitting: Cardiology

## 2020-05-13 ENCOUNTER — Encounter (HOSPITAL_COMMUNITY)
Admission: RE | Admit: 2020-05-13 | Discharge: 2020-05-13 | Disposition: A | Discharge status: Home / Self Care | Payer: Medicare Other | Source: Ambulatory Visit | Attending: Cardiology | Admitting: Cardiology

## 2020-05-13 DIAGNOSIS — R0602 Shortness of breath: Secondary | ICD-10-CM | POA: Insufficient documentation

## 2020-05-13 LAB — NM MYOCAR MULTI W/SPECT W/WALL MOTION / EF
LV dias vol: 44 mL (ref 46–106)
LV sys vol: 10 mL
Peak HR: 108 {beats}/min
RATE: 0.31
Rest HR: 69 {beats}/min
SDS: 2
SRS: 0
SSS: 2
TID: 1.47

## 2020-05-13 LAB — ECHOCARDIOGRAM COMPLETE
Area-P 1/2: 2 cm2
S' Lateral: 1.63 cm

## 2020-05-13 MED ORDER — TECHNETIUM TC 99M TETROFOSMIN IV KIT
30.0000 | PACK | Freq: Once | INTRAVENOUS | Status: AC | PRN
  Administered 2020-05-13: 29.9 via INTRAVENOUS

## 2020-05-13 MED ORDER — SODIUM CHLORIDE FLUSH 0.9 % IV SOLN
INTRAVENOUS | Status: AC
  Administered 2020-05-13: 10 mL via INTRAVENOUS
  Filled 2020-05-13: qty 10

## 2020-05-13 MED ORDER — TECHNETIUM TC 99M TETROFOSMIN IV KIT
10.0000 | PACK | Freq: Once | INTRAVENOUS | Status: AC | PRN
  Administered 2020-05-13: 10.22 via INTRAVENOUS

## 2020-05-13 MED ORDER — REGADENOSON 0.4 MG/5ML IV SOLN
INTRAVENOUS | Status: AC
  Administered 2020-05-13: 0.4 mg via INTRAVENOUS
  Filled 2020-05-13: qty 5

## 2020-05-13 NOTE — Progress Notes (Signed)
*  PRELIMINARY RESULTS* Echocardiogram 2D Echocardiogram has been performed.  Samuel Germany 05/13/2020, 10:19 AM

## 2020-05-23 DIAGNOSIS — E1122 Type 2 diabetes mellitus with diabetic chronic kidney disease: Secondary | ICD-10-CM | POA: Diagnosis not present

## 2020-05-23 DIAGNOSIS — K219 Gastro-esophageal reflux disease without esophagitis: Secondary | ICD-10-CM | POA: Diagnosis not present

## 2020-05-23 DIAGNOSIS — N189 Chronic kidney disease, unspecified: Secondary | ICD-10-CM | POA: Diagnosis not present

## 2020-05-23 DIAGNOSIS — E782 Mixed hyperlipidemia: Secondary | ICD-10-CM | POA: Diagnosis not present

## 2020-05-23 DIAGNOSIS — I129 Hypertensive chronic kidney disease with stage 1 through stage 4 chronic kidney disease, or unspecified chronic kidney disease: Secondary | ICD-10-CM | POA: Diagnosis not present

## 2020-06-15 ENCOUNTER — Ambulatory Visit (HOSPITAL_COMMUNITY): Payer: Medicare Other

## 2020-07-01 DIAGNOSIS — E1122 Type 2 diabetes mellitus with diabetic chronic kidney disease: Secondary | ICD-10-CM | POA: Diagnosis not present

## 2020-07-01 DIAGNOSIS — D696 Thrombocytopenia, unspecified: Secondary | ICD-10-CM | POA: Diagnosis not present

## 2020-07-01 DIAGNOSIS — E782 Mixed hyperlipidemia: Secondary | ICD-10-CM | POA: Diagnosis not present

## 2020-07-01 DIAGNOSIS — K219 Gastro-esophageal reflux disease without esophagitis: Secondary | ICD-10-CM | POA: Diagnosis not present

## 2020-07-11 DIAGNOSIS — E785 Hyperlipidemia, unspecified: Secondary | ICD-10-CM | POA: Diagnosis not present

## 2020-07-11 DIAGNOSIS — Z9049 Acquired absence of other specified parts of digestive tract: Secondary | ICD-10-CM | POA: Diagnosis not present

## 2020-07-11 DIAGNOSIS — Z712 Person consulting for explanation of examination or test findings: Secondary | ICD-10-CM | POA: Diagnosis not present

## 2020-07-11 DIAGNOSIS — N183 Chronic kidney disease, stage 3 unspecified: Secondary | ICD-10-CM | POA: Diagnosis not present

## 2020-07-11 DIAGNOSIS — E1122 Type 2 diabetes mellitus with diabetic chronic kidney disease: Secondary | ICD-10-CM | POA: Diagnosis not present

## 2020-07-14 DIAGNOSIS — K219 Gastro-esophageal reflux disease without esophagitis: Secondary | ICD-10-CM | POA: Diagnosis not present

## 2020-07-14 DIAGNOSIS — M4802 Spinal stenosis, cervical region: Secondary | ICD-10-CM | POA: Diagnosis not present

## 2020-07-14 DIAGNOSIS — Z712 Person consulting for explanation of examination or test findings: Secondary | ICD-10-CM | POA: Diagnosis not present

## 2020-07-14 DIAGNOSIS — R109 Unspecified abdominal pain: Secondary | ICD-10-CM | POA: Diagnosis not present

## 2020-07-14 DIAGNOSIS — E782 Mixed hyperlipidemia: Secondary | ICD-10-CM | POA: Diagnosis not present

## 2020-07-14 DIAGNOSIS — D696 Thrombocytopenia, unspecified: Secondary | ICD-10-CM | POA: Diagnosis not present

## 2020-07-14 DIAGNOSIS — E1122 Type 2 diabetes mellitus with diabetic chronic kidney disease: Secondary | ICD-10-CM | POA: Diagnosis not present

## 2020-07-14 DIAGNOSIS — E46 Unspecified protein-calorie malnutrition: Secondary | ICD-10-CM | POA: Diagnosis not present

## 2020-07-14 DIAGNOSIS — I129 Hypertensive chronic kidney disease with stage 1 through stage 4 chronic kidney disease, or unspecified chronic kidney disease: Secondary | ICD-10-CM | POA: Diagnosis not present

## 2020-07-25 ENCOUNTER — Other Ambulatory Visit: Payer: Self-pay

## 2020-07-25 ENCOUNTER — Ambulatory Visit (HOSPITAL_COMMUNITY)
Admission: RE | Admit: 2020-07-25 | Discharge: 2020-07-25 | Disposition: A | Discharge status: Home / Self Care | Payer: Medicare Other | Source: Ambulatory Visit | Attending: Internal Medicine | Admitting: Internal Medicine

## 2020-07-25 DIAGNOSIS — Z1231 Encounter for screening mammogram for malignant neoplasm of breast: Secondary | ICD-10-CM | POA: Diagnosis not present

## 2020-07-30 DIAGNOSIS — R109 Unspecified abdominal pain: Secondary | ICD-10-CM | POA: Diagnosis not present

## 2020-07-30 DIAGNOSIS — I129 Hypertensive chronic kidney disease with stage 1 through stage 4 chronic kidney disease, or unspecified chronic kidney disease: Secondary | ICD-10-CM | POA: Diagnosis not present

## 2020-07-30 DIAGNOSIS — E782 Mixed hyperlipidemia: Secondary | ICD-10-CM | POA: Diagnosis not present

## 2020-07-30 DIAGNOSIS — E46 Unspecified protein-calorie malnutrition: Secondary | ICD-10-CM | POA: Diagnosis not present

## 2020-07-30 DIAGNOSIS — D696 Thrombocytopenia, unspecified: Secondary | ICD-10-CM | POA: Diagnosis not present

## 2020-07-30 DIAGNOSIS — K219 Gastro-esophageal reflux disease without esophagitis: Secondary | ICD-10-CM | POA: Diagnosis not present

## 2020-07-30 DIAGNOSIS — M4802 Spinal stenosis, cervical region: Secondary | ICD-10-CM | POA: Diagnosis not present

## 2020-07-30 DIAGNOSIS — E1122 Type 2 diabetes mellitus with diabetic chronic kidney disease: Secondary | ICD-10-CM | POA: Diagnosis not present

## 2020-08-03 ENCOUNTER — Other Ambulatory Visit (HOSPITAL_COMMUNITY): Payer: Self-pay | Admitting: Internal Medicine

## 2020-08-03 DIAGNOSIS — R928 Other abnormal and inconclusive findings on diagnostic imaging of breast: Secondary | ICD-10-CM

## 2020-08-09 ENCOUNTER — Ambulatory Visit (HOSPITAL_COMMUNITY)
Admission: RE | Admit: 2020-08-09 | Discharge: 2020-08-09 | Disposition: A | Discharge status: Home / Self Care | Payer: Medicare Other | Source: Ambulatory Visit | Attending: Internal Medicine | Admitting: Internal Medicine

## 2020-08-09 ENCOUNTER — Other Ambulatory Visit: Payer: Self-pay

## 2020-08-09 DIAGNOSIS — R928 Other abnormal and inconclusive findings on diagnostic imaging of breast: Secondary | ICD-10-CM | POA: Insufficient documentation

## 2020-08-09 DIAGNOSIS — R922 Inconclusive mammogram: Secondary | ICD-10-CM | POA: Diagnosis not present

## 2020-08-23 ENCOUNTER — Encounter (HOSPITAL_COMMUNITY): Payer: Medicare Other

## 2020-08-29 DIAGNOSIS — E46 Unspecified protein-calorie malnutrition: Secondary | ICD-10-CM | POA: Diagnosis not present

## 2020-08-29 DIAGNOSIS — R109 Unspecified abdominal pain: Secondary | ICD-10-CM | POA: Diagnosis not present

## 2020-08-29 DIAGNOSIS — K219 Gastro-esophageal reflux disease without esophagitis: Secondary | ICD-10-CM | POA: Diagnosis not present

## 2020-08-29 DIAGNOSIS — M4802 Spinal stenosis, cervical region: Secondary | ICD-10-CM | POA: Diagnosis not present

## 2020-08-29 DIAGNOSIS — E782 Mixed hyperlipidemia: Secondary | ICD-10-CM | POA: Diagnosis not present

## 2020-08-29 DIAGNOSIS — E1122 Type 2 diabetes mellitus with diabetic chronic kidney disease: Secondary | ICD-10-CM | POA: Diagnosis not present

## 2020-09-28 DIAGNOSIS — E46 Unspecified protein-calorie malnutrition: Secondary | ICD-10-CM | POA: Diagnosis not present

## 2020-09-28 DIAGNOSIS — E1122 Type 2 diabetes mellitus with diabetic chronic kidney disease: Secondary | ICD-10-CM | POA: Diagnosis not present

## 2020-09-28 DIAGNOSIS — K219 Gastro-esophageal reflux disease without esophagitis: Secondary | ICD-10-CM | POA: Diagnosis not present

## 2020-09-28 DIAGNOSIS — M4802 Spinal stenosis, cervical region: Secondary | ICD-10-CM | POA: Diagnosis not present

## 2020-09-28 DIAGNOSIS — E782 Mixed hyperlipidemia: Secondary | ICD-10-CM | POA: Diagnosis not present

## 2020-09-28 DIAGNOSIS — R109 Unspecified abdominal pain: Secondary | ICD-10-CM | POA: Diagnosis not present

## 2020-10-30 DIAGNOSIS — K219 Gastro-esophageal reflux disease without esophagitis: Secondary | ICD-10-CM | POA: Diagnosis not present

## 2020-11-10 DIAGNOSIS — Z712 Person consulting for explanation of examination or test findings: Secondary | ICD-10-CM | POA: Diagnosis not present

## 2020-11-10 DIAGNOSIS — E785 Hyperlipidemia, unspecified: Secondary | ICD-10-CM | POA: Diagnosis not present

## 2020-11-10 DIAGNOSIS — Z9049 Acquired absence of other specified parts of digestive tract: Secondary | ICD-10-CM | POA: Diagnosis not present

## 2020-11-10 DIAGNOSIS — N183 Chronic kidney disease, stage 3 unspecified: Secondary | ICD-10-CM | POA: Diagnosis not present

## 2020-11-10 DIAGNOSIS — E1122 Type 2 diabetes mellitus with diabetic chronic kidney disease: Secondary | ICD-10-CM | POA: Diagnosis not present

## 2020-11-15 DIAGNOSIS — K589 Irritable bowel syndrome without diarrhea: Secondary | ICD-10-CM | POA: Diagnosis not present

## 2020-11-15 DIAGNOSIS — M4802 Spinal stenosis, cervical region: Secondary | ICD-10-CM | POA: Diagnosis not present

## 2020-11-15 DIAGNOSIS — E1169 Type 2 diabetes mellitus with other specified complication: Secondary | ICD-10-CM | POA: Diagnosis not present

## 2020-11-15 DIAGNOSIS — K219 Gastro-esophageal reflux disease without esophagitis: Secondary | ICD-10-CM | POA: Diagnosis not present

## 2020-11-15 DIAGNOSIS — D75838 Other thrombocytosis: Secondary | ICD-10-CM | POA: Diagnosis not present

## 2020-11-15 DIAGNOSIS — N1831 Chronic kidney disease, stage 3a: Secondary | ICD-10-CM | POA: Diagnosis not present

## 2020-11-15 DIAGNOSIS — L723 Sebaceous cyst: Secondary | ICD-10-CM | POA: Diagnosis not present

## 2020-11-15 DIAGNOSIS — I10 Essential (primary) hypertension: Secondary | ICD-10-CM | POA: Diagnosis not present

## 2020-11-15 DIAGNOSIS — E782 Mixed hyperlipidemia: Secondary | ICD-10-CM | POA: Diagnosis not present

## 2020-11-15 DIAGNOSIS — M5441 Lumbago with sciatica, right side: Secondary | ICD-10-CM | POA: Diagnosis not present

## 2020-12-19 DIAGNOSIS — K219 Gastro-esophageal reflux disease without esophagitis: Secondary | ICD-10-CM | POA: Diagnosis not present

## 2021-01-29 DIAGNOSIS — K219 Gastro-esophageal reflux disease without esophagitis: Secondary | ICD-10-CM | POA: Diagnosis not present

## 2021-03-01 DIAGNOSIS — K219 Gastro-esophageal reflux disease without esophagitis: Secondary | ICD-10-CM | POA: Diagnosis not present

## 2021-03-13 DIAGNOSIS — I10 Essential (primary) hypertension: Secondary | ICD-10-CM | POA: Diagnosis not present

## 2021-03-13 DIAGNOSIS — E1169 Type 2 diabetes mellitus with other specified complication: Secondary | ICD-10-CM | POA: Diagnosis not present

## 2021-03-16 DIAGNOSIS — D75838 Other thrombocytosis: Secondary | ICD-10-CM | POA: Diagnosis not present

## 2021-03-16 DIAGNOSIS — M4802 Spinal stenosis, cervical region: Secondary | ICD-10-CM | POA: Diagnosis not present

## 2021-03-16 DIAGNOSIS — M5441 Lumbago with sciatica, right side: Secondary | ICD-10-CM | POA: Diagnosis not present

## 2021-03-16 DIAGNOSIS — Z0001 Encounter for general adult medical examination with abnormal findings: Secondary | ICD-10-CM | POA: Diagnosis not present

## 2021-03-16 DIAGNOSIS — E782 Mixed hyperlipidemia: Secondary | ICD-10-CM | POA: Diagnosis not present

## 2021-03-16 DIAGNOSIS — K219 Gastro-esophageal reflux disease without esophagitis: Secondary | ICD-10-CM | POA: Diagnosis not present

## 2021-03-16 DIAGNOSIS — K589 Irritable bowel syndrome without diarrhea: Secondary | ICD-10-CM | POA: Diagnosis not present

## 2021-03-16 DIAGNOSIS — E1169 Type 2 diabetes mellitus with other specified complication: Secondary | ICD-10-CM | POA: Diagnosis not present

## 2021-03-16 DIAGNOSIS — I10 Essential (primary) hypertension: Secondary | ICD-10-CM | POA: Diagnosis not present

## 2021-03-16 DIAGNOSIS — N1831 Chronic kidney disease, stage 3a: Secondary | ICD-10-CM | POA: Diagnosis not present

## 2021-03-30 ENCOUNTER — Ambulatory Visit (INDEPENDENT_AMBULATORY_CARE_PROVIDER_SITE_OTHER): Payer: Medicare Other | Admitting: Orthopedic Surgery

## 2021-03-30 ENCOUNTER — Ambulatory Visit: Payer: Medicare Other

## 2021-03-30 ENCOUNTER — Other Ambulatory Visit: Payer: Self-pay

## 2021-03-30 ENCOUNTER — Encounter: Payer: Self-pay | Admitting: Orthopedic Surgery

## 2021-03-30 DIAGNOSIS — M542 Cervicalgia: Secondary | ICD-10-CM

## 2021-03-30 DIAGNOSIS — M47812 Spondylosis without myelopathy or radiculopathy, cervical region: Secondary | ICD-10-CM | POA: Diagnosis not present

## 2021-03-30 MED ORDER — DICLOFENAC POTASSIUM 50 MG PO TABS: 50.0000 mg | ORAL_TABLET | Freq: Two times a day (BID) | ORAL | 3 refills | Status: DC

## 2021-03-30 NOTE — Progress Notes (Signed)
Chief Complaint  Patient presents with   Neck Pain    States neck painful wants to know" if spinal cord is getting worse"   80 year old female history of cervical spondylosis last MRI was in 2019 presents with worsening axial neck pain unrelieved by Tylenol.  She takes 100 mg of gabapentin 3 times a day recently increased to 303 times a day been decreased to twice a day but she says 3 times a day works better  She does not appear to be having any myelopathic signs  There were no vitals taken for this visit.  Cervical spine is tender decreased range of motion mild crepitance upper extremity strength is normal no reflex changes no numbness no tingling  X-ray shows cervical spondylosis from C3-T1 with uncovertebral joint disease endplate disease and anterior osteophytes  Encounter Diagnoses  Name Primary?   Neck pain Yes   Spondylosis without myelopathy or radiculopathy, cervical region     Meds ordered this encounter  Medications   diclofenac (CATAFLAM) 50 MG tablet    Sig: Take 1 tablet (50 mg total) by mouth 2 (two) times daily.    Dispense:  90 tablet    Refill:  3    CHRONIC PROBLEM WORSE/RX [id number]   4 WEEKS F/U

## 2021-03-31 DIAGNOSIS — K219 Gastro-esophageal reflux disease without esophagitis: Secondary | ICD-10-CM | POA: Diagnosis not present

## 2021-04-24 ENCOUNTER — Encounter (HOSPITAL_COMMUNITY): Payer: Self-pay | Admitting: *Deleted

## 2021-04-24 ENCOUNTER — Emergency Department (HOSPITAL_COMMUNITY)
Admission: EM | Admit: 2021-04-24 | Discharge: 2021-04-24 | Disposition: A | Discharge status: Home / Self Care | Payer: Medicare Other | Attending: Emergency Medicine | Admitting: Emergency Medicine

## 2021-04-24 ENCOUNTER — Other Ambulatory Visit: Payer: Self-pay

## 2021-04-24 ENCOUNTER — Emergency Department (HOSPITAL_COMMUNITY): Payer: Medicare Other

## 2021-04-24 DIAGNOSIS — K449 Diaphragmatic hernia without obstruction or gangrene: Secondary | ICD-10-CM | POA: Diagnosis not present

## 2021-04-24 DIAGNOSIS — Z9049 Acquired absence of other specified parts of digestive tract: Secondary | ICD-10-CM | POA: Diagnosis not present

## 2021-04-24 DIAGNOSIS — Z79899 Other long term (current) drug therapy: Secondary | ICD-10-CM | POA: Insufficient documentation

## 2021-04-24 DIAGNOSIS — N281 Cyst of kidney, acquired: Secondary | ICD-10-CM | POA: Diagnosis not present

## 2021-04-24 DIAGNOSIS — I10 Essential (primary) hypertension: Secondary | ICD-10-CM | POA: Diagnosis not present

## 2021-04-24 DIAGNOSIS — R1032 Left lower quadrant pain: Secondary | ICD-10-CM

## 2021-04-24 DIAGNOSIS — I700 Atherosclerosis of aorta: Secondary | ICD-10-CM | POA: Diagnosis not present

## 2021-04-24 LAB — URINALYSIS, ROUTINE W REFLEX MICROSCOPIC
Bilirubin Urine: NEGATIVE
Glucose, UA: NEGATIVE mg/dL
Hgb urine dipstick: NEGATIVE
Ketones, ur: NEGATIVE mg/dL
Leukocytes,Ua: NEGATIVE
Nitrite: NEGATIVE
Protein, ur: NEGATIVE mg/dL
Specific Gravity, Urine: 1.024 (ref 1.005–1.030)
pH: 5 (ref 5.0–8.0)

## 2021-04-24 LAB — CBC WITH DIFFERENTIAL/PLATELET
Abs Immature Granulocytes: 0.01 10*3/uL (ref 0.00–0.07)
Basophils Absolute: 0.1 10*3/uL (ref 0.0–0.1)
Basophils Relative: 1 %
Eosinophils Absolute: 0.1 10*3/uL (ref 0.0–0.5)
Eosinophils Relative: 2 %
HCT: 42.2 % (ref 36.0–46.0)
Hemoglobin: 13.6 g/dL (ref 12.0–15.0)
Immature Granulocytes: 0 %
Lymphocytes Relative: 35 %
Lymphs Abs: 1.9 10*3/uL (ref 0.7–4.0)
MCH: 30.8 pg (ref 26.0–34.0)
MCHC: 32.2 g/dL (ref 30.0–36.0)
MCV: 95.7 fL (ref 80.0–100.0)
Monocytes Absolute: 0.5 10*3/uL (ref 0.1–1.0)
Monocytes Relative: 10 %
Neutro Abs: 2.9 10*3/uL (ref 1.7–7.7)
Neutrophils Relative %: 52 %
Platelets: 365 10*3/uL (ref 150–400)
RBC: 4.41 MIL/uL (ref 3.87–5.11)
RDW: 12.9 % (ref 11.5–15.5)
WBC: 5.4 10*3/uL (ref 4.0–10.5)
nRBC: 0 % (ref 0.0–0.2)

## 2021-04-24 LAB — COMPREHENSIVE METABOLIC PANEL
ALT: 16 U/L (ref 0–44)
AST: 20 U/L (ref 15–41)
Albumin: 4.4 g/dL (ref 3.5–5.0)
Alkaline Phosphatase: 79 U/L (ref 38–126)
Anion gap: 6 (ref 5–15)
BUN: 13 mg/dL (ref 8–23)
CO2: 27 mmol/L (ref 22–32)
Calcium: 9.5 mg/dL (ref 8.9–10.3)
Chloride: 107 mmol/L (ref 98–111)
Creatinine, Ser: 0.9 mg/dL (ref 0.44–1.00)
GFR, Estimated: >60 mL/min (ref >60)
Glucose, Bld: 95 mg/dL (ref 70–99)
Potassium: 3.8 mmol/L (ref 3.5–5.1)
Sodium: 140 mmol/L (ref 135–145)
Total Bilirubin: 0.7 mg/dL (ref 0.3–1.2)
Total Protein: 7.7 g/dL (ref 6.5–8.1)

## 2021-04-24 LAB — LIPASE, BLOOD: Lipase: 24 U/L (ref 11–51)

## 2021-04-24 MED ORDER — TRAMADOL HCL 50 MG PO TABS: 50.0000 mg | ORAL_TABLET | Freq: Two times a day (BID) | ORAL | 0 refills | Status: DC | PRN

## 2021-04-24 MED ORDER — IOHEXOL 300 MG/ML  SOLN
100.0000 mL | Freq: Once | INTRAMUSCULAR | Status: AC | PRN
  Administered 2021-04-24: 100 mL via INTRAVENOUS

## 2021-04-24 MED ORDER — PANTOPRAZOLE SODIUM 40 MG PO TBEC: 40.0000 mg | DELAYED_RELEASE_TABLET | Freq: Every day | ORAL | 0 refills | Status: AC

## 2021-04-24 NOTE — ED Provider Notes (Signed)
[facility] EMERGENCY DEPARTMENT Provider Note   CSN: [id number] Arrival date & time: 04/24/21  1643     History Chief Complaint  Patient presents with   Abdominal Pain    Audrey Johnson is a 80 y.o. female.  She is complaining of 1 month of abdominal pain.  She said it started epigastric and is migrated to her left lower quadrant.  It is associated with nausea and loose stools on and off.  She called her primary care doctor and has an appointment in December to evaluate this.  She also said she has had some discomfort in her chest and shortness of breath although this has been going on for much longer than that.  She thinks she might of had a fever.  No urinary symptoms.  The history is provided by the patient.  Abdominal Pain Pain location:  LLQ Pain quality: aching   Pain radiates to:  Back Pain severity:  Moderate Onset quality:  Gradual Duration:  4 weeks Timing:  Intermittent Progression:  Worsening Chronicity:  New Context: not trauma   Relieved by:  Nothing Worsened by:  Nothing Ineffective treatments:  None tried Associated symptoms: chest pain, diarrhea, fever, nausea and shortness of breath   Associated symptoms: no constipation, no cough, no dysuria, no sore throat and no vomiting       Past Medical History:  Diagnosis Date   Anxiety    Arthritis    Borderline diabetes    Chronic abdominal pain    Chronic back pain    Chronic constipation    Chronic nausea    Depression    Essential hypertension    GERD (gastroesophageal reflux disease)    History of pneumonia    Hyperlipidemia    Noncompliance with medication regimen     Patient Active Problem List   Diagnosis Date Noted   Chronic cholecystitis 02/04/2018   Biliary dyskinesia 01/21/2018   Urinary tract infection symptoms 03/11/2017   GERD (gastroesophageal reflux disease) 07/20/2013   Abnormal LFTs 07/20/2013   Constipation 07/20/2013   Depression 04/24/2013   Chronic abdominal pain  12/04/2012   Dyspnea 05/19/2012   Benign hypertension 05/19/2012   Hyperlipidemia 05/19/2012    Past Surgical History:  Procedure Laterality Date   ABDOMINAL HYSTERECTOMY     BREAST SURGERY  unknown (many yrs)   excision  of lump right breast  was per pt "nothing"    CARDIAC CATHETERIZATION     CATARACT EXTRACTION W/PHACO Left 02/21/2015   Procedure: CATARACT EXTRACTION PHACO AND INTRAOCULAR LENS PLACEMENT LEFT EYE CDE=5.86;  Surgeon: Tonny Branch, MD;  Location: [facility];  Service: Ophthalmology;  Laterality: Left;   CATARACT EXTRACTION W/PHACO Right 03/14/2015   Procedure: CATARACT EXTRACTION PHACO AND INTRAOCULAR LENS PLACEMENT ([place]);  Surgeon: Tonny Branch, MD;  Location: [facility];  Service: Ophthalmology;  Laterality: Right;  CDE:6.36   CHOLECYSTECTOMY N/A 01/24/2018   Procedure: LAPAROSCOPIC CHOLECYSTECTOMY;  Surgeon: Virl Cagey, MD;  Location: [facility];  Service: General;  Laterality: N/A;   COLONOSCOPY  04/16/2007   RMR: Normal rectum and colon   COLONOSCOPY WITH ESOPHAGOGASTRODUODENOSCOPY (EGD) N/A 01/08/2013   UYQ:IHKV erosive reflux esophagitis. Negative H.pylori. Small hiatal hernia-s/p bx (gastritis)-TCS/Colonic polyp-hyperplastic   DILATION AND CURETTAGE OF UTERUS     ESOPHAGOGASTRODUODENOSCOPY (EGD) WITH PROPOFOL N/A 02/14/2017   DR. Rourk: normal esophagus, small hiatal hernia   LEFT HEART CATHETERIZATION WITH CORONARY ANGIOGRAM N/A 06/05/2012   Procedure: LEFT HEART CATHETERIZATION WITH CORONARY ANGIOGRAM;  Surgeon: Hillary Bow, MD;  Location: [place] CATH LAB;  Service: Cardiovascular;  Laterality: N/A;   TOE SURGERY Right    5th toe, not sure what was wrong with it     OB History   No obstetric history on file.     Family History  Problem Relation Age of Onset   Lung disease Mother    Diabetes Mother    Cancer Father    Heart disease Father    Heart disease Sister    Stroke Sister    Cancer Brother    Colon cancer Neg Hx     Social History   Tobacco Use    Smoking status: Never   Smokeless tobacco: Never   Tobacco comments:    Never smoked  Vaping Use   Vaping Use: Never used  Substance Use Topics   Alcohol use: No   Drug use: No    Home Medications Prior to Admission medications   Medication Sig Start Date End Date Taking? Authorizing Provider  atorvastatin (LIPITOR) 20 MG tablet Take 20 mg by mouth at bedtime.    Celene Squibb, MD  BYSTOLIC 10 MG tablet Take 10 mg by mouth daily. 03/04/20   [provider]  calcium-vitamin D (OSCAL WITH D) 500-200 MG-UNIT tablet Take 1 tablet by mouth.    [provider]  diclofenac (CATAFLAM) 50 MG tablet Take 1 tablet (50 mg total) by mouth 2 (two) times daily. 03/30/21   Carole Civil, MD  dicyclomine (BENTYL) 10 MG capsule Take 10 mg by mouth 4 (four) times daily -  before meals and at bedtime.    [provider]  escitalopram (LEXAPRO) 10 MG tablet Take 10 mg by mouth daily.    [provider]  gabapentin (NEURONTIN) 300 MG capsule Take by mouth 2 (two) times daily as needed. 12/23/20   [provider]  lisinopril (PRINIVIL,ZESTRIL) 10 MG tablet Take 10 mg by mouth daily.    [provider]  loratadine (CLARITIN) 10 MG tablet Take 10 mg by mouth daily. 04/04/20   [provider]  megestrol (MEGACE) 40 MG/ML suspension Take 10 mLs by mouth daily. 10/14/17   [provider]  pantoprazole (PROTONIX) 20 MG tablet Take 20 mg by mouth daily. 04/13/20   [provider]    Allergies    Patient has no known allergies.  Review of Systems   Review of Systems  Constitutional:  Positive for fever.  HENT:  Negative for sore throat.   Eyes:  Negative for visual disturbance.  Respiratory:  Positive for shortness of breath. Negative for cough.   Cardiovascular:  Positive for chest pain.  Gastrointestinal:  Positive for abdominal pain, diarrhea and nausea. Negative for constipation and vomiting.  Genitourinary:  Negative for  dysuria.  Musculoskeletal:  Negative for neck pain.  Skin:  Negative for rash.  Neurological:  Negative for headaches.   Physical Exam Updated Vital Signs BP (!) 183/81   Pulse (!) 57   Temp 98.8 F (37.1 C) (Oral)   Resp 18   Ht 5\' 4"  (1.626 m)   Wt 67.6 kg   SpO2 100%   BMI 25.58 kg/m   Physical Exam Vitals and nursing note reviewed.  Constitutional:      General: She is not in acute distress.    Appearance: Normal appearance. She is well-developed.  HENT:     Head: Normocephalic and atraumatic.  Eyes:     Conjunctiva/sclera: Conjunctivae normal.  Cardiovascular:     Rate and Rhythm:  Normal rate and regular rhythm.     Heart sounds: No murmur heard. Pulmonary:     Effort: Pulmonary effort is normal. No respiratory distress.     Breath sounds: Normal breath sounds.  Abdominal:     Palpations: Abdomen is soft.     Tenderness: There is abdominal tenderness in the left lower quadrant. There is no guarding or rebound.  Musculoskeletal:        General: No deformity or signs of injury. Normal range of motion.     Cervical back: Neck supple.  Skin:    General: Skin is warm and dry.  Neurological:     General: No focal deficit present.     Mental Status: She is alert.    ED Results / Procedures / Treatments   Labs (all labs ordered are listed, but only abnormal results are displayed) Labs Reviewed  URINALYSIS, ROUTINE W REFLEX MICROSCOPIC - Abnormal; Notable for the following components:      Result Value   APPearance HAZY (*)    All other components within normal limits  CBC WITH DIFFERENTIAL/PLATELET  COMPREHENSIVE METABOLIC PANEL  LIPASE, BLOOD    EKG EKG Interpretation  Date/Time:  Monday April 24 2021 21:19:00 EDT Ventricular Rate:  67 PR Interval:  163 QRS Duration: 85 QT Interval:  424 QTC Calculation: 448 R Axis:   48 Text Interpretation: Sinus rhythm Baseline wander in lead(s) V3 No significant change since prior 10/21 Confirmed by Aletta Edouard [id number]) on 04/24/2021 9:22:58 PM  Radiology CT Abdomen Pelvis W Contrast  Result Date: 04/24/2021 CLINICAL DATA:  Concern for acute diverticulitis. EXAM: CT ABDOMEN AND PELVIS WITH CONTRAST TECHNIQUE: Multidetector CT imaging of the abdomen and pelvis was performed using the standard protocol following bolus administration of intravenous contrast. CONTRAST:  170mL OMNIPAQUE IOHEXOL 300 MG/ML  SOLN COMPARISON:  CT abdomen pelvis dated 04/24/2020. FINDINGS: Lower chest: The visualized lung bases are clear. No intra-abdominal free air or free fluid. Hepatobiliary: The liver is unremarkable. No intrahepatic biliary dilatation. Cholecystectomy. Pancreas: A 6 mm hypodense lesion in the body of the pancreas (18/4 and 15/2) is not characterized but may represents a side branch IPMN or interspersed fat. This can be better evaluated with MRI without and with contrast on a nonemergent/outpatient basis. No inflammatory changes. No dilatation of the main pancreatic duct or gland atrophy. Spleen: Normal in size without focal abnormality. Adrenals/Urinary Tract: The adrenal glands are unremarkable. There is no hydronephrosis on either side. There is symmetric enhancement and excretion of contrast by both kidneys. There is a 15 mm right renal upper pole cyst. The visualized ureters and urinary bladder appear unremarkable. Stomach/Bowel: Small hiatal hernia. There is no bowel obstruction or active inflammation. The appendix is normal. Vascular/Lymphatic: Mild aortoiliac atherosclerotic disease. The IVC is unremarkable. No portal venous gas. There is no adenopathy. Reproductive: Hysterectomy.  No adnexal masses. Other: None Musculoskeletal: Degenerative changes of the spine. No acute osseous pathology. IMPRESSION: No acute intra-abdominal or pelvic pathology. No bowel obstruction. Normal appendix. Aortic Atherosclerosis (ICD10-I70.0). Electronically Signed   By: Anner Crete M.D.   On: 04/24/2021 23:27     Procedures Procedures   Medications Ordered in ED Medications  iohexol (OMNIPAQUE) 300 MG/ML solution 100 mL (100 mLs Intravenous Contrast Given 04/24/21 2306)    ED Course  I have reviewed the triage vital signs and the nursing notes.  Pertinent labs & imaging results that were available during my care of the patient were reviewed by me and considered in my  medical decision making (see chart for details).  Clinical Course as of 04/25/21 1013  Mon Apr 24, 2021  2337 Reviewed results with patient.  We will place her on a PPI and a short course of some pain medicine.  Recommend close follow-up with PCP. [MB]    Clinical Course User Index [MB] Hayden Rasmussen, MD   MDM Rules/Calculators/A&P                          This patient complains of left lower quadrant abdominal pain diarrhea; this involves an extensive number of treatment Options and is a complaint that carries with it a high risk of complications and Morbidity. The differential includes gastroenteritis, colitis, diverticulitis, obstruction, constipation, infectious diarrhea  I ordered, reviewed and interpreted labs, which included CBC with normal white count normal hemoglobin, chemistries and LFTs normal, urinalysis unremarkable I ordered imaging studies which included CT abdomen pelvis and I independently    visualized and interpreted imaging which showed no acute findings  Previous records obtained and reviewed in epic no recent admissions  After the interventions stated above, I reevaluated the patient and found patient be hemodynamically stable.  Nonsurgical abdomen.  Reviewed work-up with her.  Will increase her PPI and cover with some short-term pain medicine.  Recommended close follow-up with her PCP.  Return instructions discussed   Final Clinical Impression(s) / ED Diagnoses Final diagnoses:  LLQ abdominal pain    Rx / DC Orders ED Discharge Orders          Ordered    pantoprazole (PROTONIX) 40 MG  tablet  Daily        04/24/21 2339    traMADol (ULTRAM) 50 MG tablet  Every 12 hours PRN        04/24/21 2339             Hayden Rasmussen, MD 04/25/21 1015

## 2021-04-24 NOTE — Discharge Instructions (Addendum)
You were seen in the emergency department for left lower quadrant abdominal pain that is been going on for over a month.  You had blood work urinalysis and a CAT scan that did not show an obvious explanation for your symptoms.  We are prescribing you a short course of some pain medicine and increasing your acid medication.  Please contact your primary care doctor for close follow-up.  Return to the emergency department if any worsening or concerning symptoms.

## 2021-04-24 NOTE — ED Triage Notes (Signed)
Unable to obtain MSE signature due to e-signature pad not working

## 2021-04-24 NOTE — ED Triage Notes (Signed)
Pt with abd pain x 1 month. Diarrhea x 2 weeks off and on.  + nausea.  Appt with PCP not until December

## 2021-04-24 NOTE — ED Notes (Signed)
Pt placed on cardiac monitor with BP to set cycle every 30 minutes. Continuous pulse oximeter applied.  

## 2021-04-27 ENCOUNTER — Ambulatory Visit: Payer: Medicare Other | Admitting: Orthopedic Surgery

## 2021-05-01 DIAGNOSIS — K219 Gastro-esophageal reflux disease without esophagitis: Secondary | ICD-10-CM | POA: Diagnosis not present

## 2021-06-30 DIAGNOSIS — E782 Mixed hyperlipidemia: Secondary | ICD-10-CM | POA: Diagnosis not present

## 2021-06-30 DIAGNOSIS — I10 Essential (primary) hypertension: Secondary | ICD-10-CM | POA: Diagnosis not present

## 2021-07-07 DIAGNOSIS — E1169 Type 2 diabetes mellitus with other specified complication: Secondary | ICD-10-CM | POA: Diagnosis not present

## 2021-07-10 ENCOUNTER — Other Ambulatory Visit (HOSPITAL_COMMUNITY): Payer: Self-pay | Admitting: Internal Medicine

## 2021-07-10 DIAGNOSIS — Z1231 Encounter for screening mammogram for malignant neoplasm of breast: Secondary | ICD-10-CM

## 2021-07-12 DIAGNOSIS — N1831 Chronic kidney disease, stage 3a: Secondary | ICD-10-CM | POA: Diagnosis not present

## 2021-07-12 DIAGNOSIS — E1169 Type 2 diabetes mellitus with other specified complication: Secondary | ICD-10-CM | POA: Diagnosis not present

## 2021-07-12 DIAGNOSIS — K219 Gastro-esophageal reflux disease without esophagitis: Secondary | ICD-10-CM | POA: Diagnosis not present

## 2021-07-12 DIAGNOSIS — Z13820 Encounter for screening for osteoporosis: Secondary | ICD-10-CM | POA: Diagnosis not present

## 2021-07-12 DIAGNOSIS — E782 Mixed hyperlipidemia: Secondary | ICD-10-CM | POA: Diagnosis not present

## 2021-07-12 DIAGNOSIS — M4802 Spinal stenosis, cervical region: Secondary | ICD-10-CM | POA: Diagnosis not present

## 2021-07-12 DIAGNOSIS — D75838 Other thrombocytosis: Secondary | ICD-10-CM | POA: Diagnosis not present

## 2021-07-12 DIAGNOSIS — M5441 Lumbago with sciatica, right side: Secondary | ICD-10-CM | POA: Diagnosis not present

## 2021-07-12 DIAGNOSIS — K589 Irritable bowel syndrome without diarrhea: Secondary | ICD-10-CM | POA: Diagnosis not present

## 2021-07-12 DIAGNOSIS — I10 Essential (primary) hypertension: Secondary | ICD-10-CM | POA: Diagnosis not present

## 2021-07-13 ENCOUNTER — Other Ambulatory Visit (HOSPITAL_COMMUNITY): Payer: Self-pay | Admitting: Internal Medicine

## 2021-07-13 DIAGNOSIS — Z13820 Encounter for screening for osteoporosis: Secondary | ICD-10-CM

## 2021-07-31 ENCOUNTER — Ambulatory Visit (HOSPITAL_COMMUNITY)
Admission: RE | Admit: 2021-07-31 | Discharge: 2021-07-31 | Disposition: A | Discharge status: Home / Self Care | Payer: Medicare Other | Source: Ambulatory Visit | Attending: Internal Medicine | Admitting: Internal Medicine

## 2021-07-31 ENCOUNTER — Other Ambulatory Visit: Payer: Self-pay

## 2021-07-31 DIAGNOSIS — Z1231 Encounter for screening mammogram for malignant neoplasm of breast: Secondary | ICD-10-CM | POA: Diagnosis not present

## 2021-07-31 DIAGNOSIS — M810 Age-related osteoporosis without current pathological fracture: Secondary | ICD-10-CM | POA: Insufficient documentation

## 2021-07-31 DIAGNOSIS — Z780 Asymptomatic menopausal state: Secondary | ICD-10-CM | POA: Diagnosis not present

## 2021-07-31 DIAGNOSIS — Z13820 Encounter for screening for osteoporosis: Secondary | ICD-10-CM

## 2021-07-31 DIAGNOSIS — M85851 Other specified disorders of bone density and structure, right thigh: Secondary | ICD-10-CM | POA: Diagnosis not present

## 2021-08-01 DIAGNOSIS — I10 Essential (primary) hypertension: Secondary | ICD-10-CM | POA: Diagnosis not present

## 2021-08-01 DIAGNOSIS — E782 Mixed hyperlipidemia: Secondary | ICD-10-CM | POA: Diagnosis not present

## 2021-08-11 DIAGNOSIS — M810 Age-related osteoporosis without current pathological fracture: Secondary | ICD-10-CM | POA: Diagnosis not present

## 2021-09-16 ENCOUNTER — Other Ambulatory Visit: Payer: Self-pay | Admitting: Orthopedic Surgery

## 2021-09-16 DIAGNOSIS — M542 Cervicalgia: Secondary | ICD-10-CM

## 2021-09-25 ENCOUNTER — Telehealth: Payer: Self-pay | Admitting: Orthopedic Surgery

## 2021-09-25 ENCOUNTER — Other Ambulatory Visit: Payer: Self-pay | Admitting: Orthopedic Surgery

## 2021-09-25 DIAGNOSIS — M542 Cervicalgia: Secondary | ICD-10-CM

## 2021-09-25 MED ORDER — DICLOFENAC POTASSIUM 50 MG PO TABS: 50.0000 mg | ORAL_TABLET | Freq: Two times a day (BID) | ORAL | 0 refills | Status: DC

## 2021-09-25 NOTE — Telephone Encounter (Signed)
Advise that if she wants 90 days supply  then request a 90 day supply and I ll resubmit

## 2021-09-25 NOTE — Telephone Encounter (Signed)
Please advise 

## 2021-09-25 NOTE — Telephone Encounter (Signed)
Voice message received from Amy at [facility], [phone], regarding medication  ?diclofenac (CATAFLAM) 50 MG tablet 90 tablet  ?    - states quantity for 90 tablets may need to be changed in order for patient to have a 90 day supply; please advise if quantity is to be 180. ?

## 2021-09-29 DIAGNOSIS — K219 Gastro-esophageal reflux disease without esophagitis: Secondary | ICD-10-CM | POA: Diagnosis not present

## 2021-10-12 ENCOUNTER — Emergency Department (HOSPITAL_COMMUNITY): Payer: Medicare Other

## 2021-10-12 ENCOUNTER — Encounter (HOSPITAL_COMMUNITY): Payer: Self-pay | Admitting: Emergency Medicine

## 2021-10-12 ENCOUNTER — Other Ambulatory Visit: Payer: Self-pay

## 2021-10-12 ENCOUNTER — Emergency Department (HOSPITAL_COMMUNITY)
Admission: EM | Admit: 2021-10-12 | Discharge: 2021-10-12 | Disposition: A | Discharge status: Home / Self Care | Payer: Medicare Other | Attending: Emergency Medicine | Admitting: Emergency Medicine

## 2021-10-12 DIAGNOSIS — M25561 Pain in right knee: Secondary | ICD-10-CM | POA: Diagnosis not present

## 2021-10-12 DIAGNOSIS — I10 Essential (primary) hypertension: Secondary | ICD-10-CM | POA: Diagnosis not present

## 2021-10-12 DIAGNOSIS — Z043 Encounter for examination and observation following other accident: Secondary | ICD-10-CM | POA: Diagnosis not present

## 2021-10-12 DIAGNOSIS — Z79899 Other long term (current) drug therapy: Secondary | ICD-10-CM | POA: Diagnosis not present

## 2021-10-12 DIAGNOSIS — M545 Low back pain, unspecified: Secondary | ICD-10-CM | POA: Insufficient documentation

## 2021-10-12 DIAGNOSIS — S0990XA Unspecified injury of head, initial encounter: Secondary | ICD-10-CM | POA: Diagnosis not present

## 2021-10-12 DIAGNOSIS — M546 Pain in thoracic spine: Secondary | ICD-10-CM | POA: Diagnosis not present

## 2021-10-12 DIAGNOSIS — R911 Solitary pulmonary nodule: Secondary | ICD-10-CM | POA: Insufficient documentation

## 2021-10-12 DIAGNOSIS — M5126 Other intervertebral disc displacement, lumbar region: Secondary | ICD-10-CM | POA: Diagnosis not present

## 2021-10-12 HISTORY — DX: Accidental discharge from unspecified firearms or gun, initial encounter: W34.00XA

## 2021-10-12 MED ORDER — LIDOCAINE 5 % EX PTCH
1.0000 | MEDICATED_PATCH | CUTANEOUS | Status: DC
  Administered 2021-10-12: 1 via TRANSDERMAL
  Filled 2021-10-12: qty 1

## 2021-10-12 MED ORDER — IBUPROFEN 600 MG PO TABS: 600.0000 mg | ORAL_TABLET | Freq: Four times a day (QID) | ORAL | 0 refills | Status: DC | PRN

## 2021-10-12 MED ORDER — ACETAMINOPHEN 500 MG PO TABS: 500.0000 mg | ORAL_TABLET | Freq: Four times a day (QID) | ORAL | 0 refills | Status: AC | PRN

## 2021-10-12 MED ORDER — IBUPROFEN 800 MG PO TABS
800.0000 mg | ORAL_TABLET | Freq: Once | ORAL | Status: AC
  Administered 2021-10-12: 800 mg via ORAL
  Filled 2021-10-12: qty 1

## 2021-10-12 MED ORDER — ACETAMINOPHEN 325 MG PO TABS
650.0000 mg | ORAL_TABLET | Freq: Once | ORAL | Status: AC
  Administered 2021-10-12: 650 mg via ORAL
  Filled 2021-10-12: qty 2

## 2021-10-12 MED ORDER — IBUPROFEN 400 MG PO TABS: 400.0000 mg | ORAL_TABLET | Freq: Four times a day (QID) | ORAL | 0 refills | Status: DC | PRN

## 2021-10-12 MED ORDER — LIDOCAINE 5 % EX PTCH: 1.0000 | MEDICATED_PATCH | Freq: Every day | CUTANEOUS | 0 refills | Status: DC | PRN

## 2021-10-12 NOTE — ED Triage Notes (Signed)
Pt c/o lower back pain all across and goes all the way up to neck area per pt. Pt states she fell about 2 weeks ago. States thought it would stop but hasnt. Has taken tylenol with little relief. Pt ambulatory with cane. Nad.  ?

## 2021-10-12 NOTE — ED Provider Notes (Signed)
?[place] ?Provider Note ? ? ?CSN: [id number] ?Arrival date & time: 10/12/21  1623 ? ?  ? ?History ? ?Chief Complaint  ?Patient presents with  ? Back Pain  ? ? ?Audrey Johnson is a 81 y.o. female. ? ? Patient as above with significant medical history as below, including anxiety, chronic abdominal pain, chronic back pain, chronic constipation, hypertension who presents to the ED with complaint of back pain following a fall. ? ?Patient reports that she tripped over a grand child's toy on the floor 1.5 to 2 weeks ago.  She fell onto her backside.  She is been having progressively worsening back pain since then.  Localized to her low back region.  She is been having some difficulty walking, she has been using a cane to help with her gait difficulties secondary to the ongoing back pain.  She is been taking Tylenol intermittently with moderately for the discomfort which is transient.  ? ?She denies numbness, tingling, urinary overflow or bowel incontinence.  Change in bowel or bladder function.  No nausea or vomiting, no fevers or chills.  No extremity weakness, no head injury, no neck injury. ? ? ?Past Medical History: ?No date: Anxiety ?No date: Arthritis ?No date: Borderline diabetes ?No date: Chronic abdominal pain ?No date: Chronic back pain ?No date: Chronic constipation ?No date: Chronic nausea ?No date: Depression ?No date: Essential hypertension ?No date: GERD (gastroesophageal reflux disease) ?No date: History of pneumonia ?No date: Hyperlipidemia ?No date: Noncompliance with medication regimen ? ?Past Surgical History: ?No date: ABDOMINAL HYSTERECTOMY ?unknown (many yrs): BREAST SURGERY ?    Comment:  excision  of lump right breast  was per pt "nothing"  ?No date: CARDIAC CATHETERIZATION ?02/21/2015: CATARACT EXTRACTION W/PHACO; Left ?    Comment:  Procedure: CATARACT EXTRACTION PHACO AND INTRAOCULAR  ?             LENS PLACEMENT LEFT EYE CDE=5.86;  Surgeon: Tonny Branch,  ?              MD;  Location: [facility];  Service: Ophthalmology;   ?             Laterality: Left; ?03/14/2015: CATARACT EXTRACTION W/PHACO; Right ?    Comment:  Procedure: CATARACT EXTRACTION PHACO AND INTRAOCULAR  ?             LENS PLACEMENT ([place]);  Surgeon: Tonny Branch, MD;   ?             Location: [facility];  Service: Ophthalmology;  Laterality:  ?             Right;  CDE:6.36 ?01/24/2018: CHOLECYSTECTOMY; N/A ?    Comment:  Procedure: LAPAROSCOPIC CHOLECYSTECTOMY;  Surgeon:  ?             Virl Cagey, MD;  Location: [facility];  Service:  ?             General;  Laterality: N/A; ?04/16/2007: COLONOSCOPY ?    Comment:  RMR: Normal rectum and colon ?01/08/2013: COLONOSCOPY WITH ESOPHAGOGASTRODUODENOSCOPY (EGD); N/A ?    Comment:  JAS:NKNL erosive reflux esophagitis. Negative H.pylori.  ?             Small hiatal hernia-s/p bx (gastritis)-TCS/Colonic  ?             polyp-hyperplastic ?No date: DILATION AND CURETTAGE OF UTERUS ?02/14/2017: ESOPHAGOGASTRODUODENOSCOPY (EGD) WITH PROPOFOL; N/A ?    Comment:  DR. Gala Romney: normal esophagus, small hiatal  hernia ?06/05/2012: LEFT HEART CATHETERIZATION WITH CORONARY ANGIOGRAM; N/A ?    Comment:  Procedure: LEFT HEART CATHETERIZATION WITH CORONARY  ?             ANGIOGRAM;  Surgeon: Hillary Bow, MD;  Location: [place]  ?             CATH LAB;  Service: Cardiovascular;  Laterality: N/A; ?No date: TOE SURGERY; Right ?    Comment:  5th toe, not sure what was wrong with it  ? ? ?The history is provided by the patient. No language interpreter was used.  ?Back Pain ?Associated symptoms: no abdominal pain, no chest pain, no fever and no headaches   ? ?  ? ?Home Medications ?Prior to Admission medications   ?Medication Sig Start Date End Date Taking? Authorizing Provider  ?acetaminophen (TYLENOL) 500 MG tablet Take 1 tablet (500 mg total) by mouth every 6 (six) hours as needed. 10/12/21 11/17/21 Yes Wynona Dove A, DO  ?albuterol (VENTOLIN HFA) 108 (90 Base) MCG/ACT inhaler Inhale 2 puffs into the lungs  every 6 (six) hours as needed for wheezing or shortness of breath.   Yes [provider]  ?atorvastatin (LIPITOR) 20 MG tablet Take 20 mg by mouth at bedtime.   Yes Celene Squibb, MD  ?BYSTOLIC 10 MG tablet Take 10 mg by mouth daily. 03/04/20  Yes [provider]  ?calcium-vitamin D (OSCAL WITH D) 500-200 MG-UNIT tablet Take 1 tablet by mouth.   Yes [provider]  ?diclofenac (CATAFLAM) 50 MG tablet Take 1 tablet (50 mg total) by mouth 2 (two) times daily. 09/25/21  Yes Carole Civil, MD  ?dicyclomine (BENTYL) 10 MG capsule Take 10 mg by mouth 4 (four) times daily -  before meals and at bedtime.   Yes [provider]  ?escitalopram (LEXAPRO) 10 MG tablet Take 10 mg by mouth daily.   Yes [provider]  ?gabapentin (NEURONTIN) 300 MG capsule Take by mouth 2 (two) times daily as needed. 12/23/20  Yes [provider]  ?lidocaine (LIDODERM) 5 % Place 1 patch onto the skin daily as needed. Remove & Discard patch within 12 hours or as directed by MD 10/12/21  Yes Wynona Dove A, DO  ?lisinopril (PRINIVIL,ZESTRIL) 10 MG tablet Take 10 mg by mouth daily.   Yes [provider]  ?megestrol (MEGACE) 40 MG/ML suspension Take 10 mLs by mouth daily. 10/14/17  Yes [provider]  ?pantoprazole (PROTONIX) 40 MG tablet Take 1 tablet (40 mg total) by mouth daily. 04/24/21  Yes Hayden Rasmussen, MD  ?ibuprofen (ADVIL) 400 MG tablet Take 1 tablet (400 mg total) by mouth every 6 (six) hours as needed for mild pain. TAKE WITH FOOD 10/12/21   Wynona Dove A, DO  ?PROLIA 60 MG/ML SOSY injection Inject into the skin. ?Patient not taking: Reported on 10/12/2021 08/09/21   [provider]  ?traMADol (ULTRAM) 50 MG tablet Take 1 tablet (50 mg total) by mouth every 12 (twelve) hours as needed. ?Patient not taking: Reported on 10/12/2021 04/24/21   Hayden Rasmussen, MD  ?   ? ?Allergies    ?Patient has no known allergies.   ? ?Review of Systems   ?Review of Systems   ?Constitutional:  Negative for chills and fever.  ?HENT:  Negative for facial swelling and trouble swallowing.   ?Eyes:  Negative for photophobia and visual disturbance.  ?Respiratory:  Negative for cough and shortness of breath.   ?Cardiovascular:  Negative for  chest pain and palpitations.  ?Gastrointestinal:  Negative for abdominal pain, nausea and vomiting.  ?Endocrine: Negative for polydipsia and polyuria.  ?Genitourinary:  Negative for difficulty urinating and hematuria.  ?Musculoskeletal:  Positive for back pain and gait problem. Negative for joint swelling.  ?Skin:  Negative for pallor and rash.  ?Neurological:  Negative for syncope and headaches.  ?Psychiatric/Behavioral:  Negative for agitation and confusion.   ? ?Physical Exam ?Updated Vital Signs ?BP (!) 153/75 (BP Location: Right Arm)   Pulse 79   Temp 98.5 ?F (36.9 ?C) (Oral)   Resp (!) 21   SpO2 93%  ?Physical Exam ?Vitals and nursing note reviewed.  ?Constitutional:   ?   General: She is not in acute distress. ?   Appearance: Normal appearance.  ?HENT:  ?   Head: Normocephalic and atraumatic. No raccoon eyes, Battle's sign, right periorbital erythema or left periorbital erythema.  ?   Jaw: No trismus.  ?   Right Ear: External ear normal.  ?   Left Ear: External ear normal.  ?   Nose: Nose normal.  ?   Mouth/Throat:  ?   Mouth: Mucous membranes are moist.  ?Eyes:  ?   General: No scleral icterus.    ?   Right eye: No discharge.     ?   Left eye: No discharge.  ?   Extraocular Movements: Extraocular movements intact.  ?   Pupils: Pupils are equal, round, and reactive to light.  ?Cardiovascular:  ?   Rate and Rhythm: Normal rate and regular rhythm.  ?   Pulses: Normal pulses.     ?     Radial pulses are 2+ on the right side and 2+ on the left side.  ?     Posterior tibial pulses are 2+ on the right side and 2+ on the left side.  ?   Heart sounds: Normal heart sounds.  ?Pulmonary:  ?   Effort: Pulmonary effort is normal. No respiratory distress.  ?    Breath sounds: Normal breath sounds.  ?Abdominal:  ?   General: Abdomen is flat.  ?   Tenderness: There is no abdominal tenderness.  ?Musculoskeletal:     ?   General: Normal range of motion.  ?     Arms: ?

## 2021-10-12 NOTE — Discharge Instructions (Addendum)
Please follow-up with your Primary Care Physician within the next week. Please take your medications as instructed and discuss any changes to your medications with your primary care physician.    Please return to the Emergency Department if you have any leg numbness, leg weakness, difficulty walking, fevers, worsening of pain, lightheadedness, lose consciousness, severe abdominal pain, severe headache, difficulty urinating, or difficulty having a bowel movement.   Please return to the emergency department immediately for any new or concerning symptoms, or if you get worse.   

## 2021-11-08 DIAGNOSIS — E782 Mixed hyperlipidemia: Secondary | ICD-10-CM | POA: Diagnosis not present

## 2021-11-08 DIAGNOSIS — E1169 Type 2 diabetes mellitus with other specified complication: Secondary | ICD-10-CM | POA: Diagnosis not present

## 2021-11-15 ENCOUNTER — Other Ambulatory Visit (HOSPITAL_COMMUNITY): Payer: Self-pay | Admitting: Internal Medicine

## 2021-11-15 ENCOUNTER — Other Ambulatory Visit: Payer: Self-pay | Admitting: Internal Medicine

## 2021-11-15 DIAGNOSIS — R221 Localized swelling, mass and lump, neck: Secondary | ICD-10-CM

## 2021-11-15 DIAGNOSIS — I10 Essential (primary) hypertension: Secondary | ICD-10-CM | POA: Diagnosis not present

## 2021-11-15 DIAGNOSIS — M4802 Spinal stenosis, cervical region: Secondary | ICD-10-CM | POA: Diagnosis not present

## 2021-11-15 DIAGNOSIS — D75838 Other thrombocytosis: Secondary | ICD-10-CM | POA: Diagnosis not present

## 2021-11-15 DIAGNOSIS — Z13820 Encounter for screening for osteoporosis: Secondary | ICD-10-CM | POA: Diagnosis not present

## 2021-11-15 DIAGNOSIS — K589 Irritable bowel syndrome without diarrhea: Secondary | ICD-10-CM | POA: Diagnosis not present

## 2021-11-15 DIAGNOSIS — E782 Mixed hyperlipidemia: Secondary | ICD-10-CM | POA: Diagnosis not present

## 2021-11-15 DIAGNOSIS — K219 Gastro-esophageal reflux disease without esophagitis: Secondary | ICD-10-CM | POA: Diagnosis not present

## 2021-11-15 DIAGNOSIS — N1831 Chronic kidney disease, stage 3a: Secondary | ICD-10-CM | POA: Diagnosis not present

## 2021-11-15 DIAGNOSIS — E1169 Type 2 diabetes mellitus with other specified complication: Secondary | ICD-10-CM | POA: Diagnosis not present

## 2021-11-15 DIAGNOSIS — R918 Other nonspecific abnormal finding of lung field: Secondary | ICD-10-CM | POA: Diagnosis not present

## 2021-11-15 DIAGNOSIS — M5441 Lumbago with sciatica, right side: Secondary | ICD-10-CM | POA: Diagnosis not present

## 2021-11-29 ENCOUNTER — Ambulatory Visit (HOSPITAL_COMMUNITY)
Admission: RE | Admit: 2021-11-29 | Discharge: 2021-11-29 | Disposition: A | Discharge status: Home / Self Care | Payer: Medicare Other | Source: Ambulatory Visit | Attending: Internal Medicine | Admitting: Internal Medicine

## 2021-11-29 DIAGNOSIS — R221 Localized swelling, mass and lump, neck: Secondary | ICD-10-CM | POA: Insufficient documentation

## 2021-12-29 DIAGNOSIS — E782 Mixed hyperlipidemia: Secondary | ICD-10-CM | POA: Diagnosis not present

## 2021-12-29 DIAGNOSIS — I129 Hypertensive chronic kidney disease with stage 1 through stage 4 chronic kidney disease, or unspecified chronic kidney disease: Secondary | ICD-10-CM | POA: Diagnosis not present

## 2021-12-29 DIAGNOSIS — N1831 Chronic kidney disease, stage 3a: Secondary | ICD-10-CM | POA: Diagnosis not present

## 2021-12-29 DIAGNOSIS — E1169 Type 2 diabetes mellitus with other specified complication: Secondary | ICD-10-CM | POA: Diagnosis not present

## 2022-01-29 DIAGNOSIS — N1831 Chronic kidney disease, stage 3a: Secondary | ICD-10-CM | POA: Diagnosis not present

## 2022-01-29 DIAGNOSIS — I129 Hypertensive chronic kidney disease with stage 1 through stage 4 chronic kidney disease, or unspecified chronic kidney disease: Secondary | ICD-10-CM | POA: Diagnosis not present

## 2022-01-29 DIAGNOSIS — E782 Mixed hyperlipidemia: Secondary | ICD-10-CM | POA: Diagnosis not present

## 2022-01-29 DIAGNOSIS — E1169 Type 2 diabetes mellitus with other specified complication: Secondary | ICD-10-CM | POA: Diagnosis not present

## 2022-03-07 ENCOUNTER — Encounter (HOSPITAL_COMMUNITY): Payer: Self-pay

## 2022-03-07 ENCOUNTER — Other Ambulatory Visit: Payer: Self-pay

## 2022-03-07 ENCOUNTER — Emergency Department (HOSPITAL_COMMUNITY)
Admission: EM | Admit: 2022-03-07 | Discharge: 2022-03-07 | Disposition: A | Discharge status: Home / Self Care | Payer: Medicare Other | Attending: Emergency Medicine | Admitting: Emergency Medicine

## 2022-03-07 DIAGNOSIS — I10 Essential (primary) hypertension: Secondary | ICD-10-CM | POA: Insufficient documentation

## 2022-03-07 DIAGNOSIS — M545 Low back pain, unspecified: Secondary | ICD-10-CM | POA: Diagnosis present

## 2022-03-07 DIAGNOSIS — M5416 Radiculopathy, lumbar region: Secondary | ICD-10-CM | POA: Diagnosis not present

## 2022-03-07 MED ORDER — PREDNISONE 10 MG (21) PO TBPK: ORAL_TABLET | Freq: Every day | ORAL | 0 refills | Status: DC

## 2022-03-07 NOTE — ED Triage Notes (Signed)
Pt ambulatory to triage, pt states that she is here for back pain going down her R leg, states that she has had the pain for the past two weeks.  Pt stats that she is here today because the pain isn't getting any better.

## 2022-03-07 NOTE — Discharge Instructions (Signed)
Your exam today was overall reassuring.  I have sent prednisone taper to your pharmacy.  I have also discussed your case with neurosurgery.  Their information is attached above.  Please give their clinic a call tomorrow to schedule an appointment.  For any worsening or concerning symptoms please return to the emergency room.
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
## 2022-03-09 DIAGNOSIS — Z23 Encounter for immunization: Secondary | ICD-10-CM | POA: Diagnosis not present

## 2022-03-09 DIAGNOSIS — E782 Mixed hyperlipidemia: Secondary | ICD-10-CM | POA: Diagnosis not present

## 2022-03-09 DIAGNOSIS — E1169 Type 2 diabetes mellitus with other specified complication: Secondary | ICD-10-CM | POA: Diagnosis not present

## 2022-03-21 ENCOUNTER — Other Ambulatory Visit: Payer: Self-pay | Admitting: Orthopedic Surgery

## 2022-03-21 DIAGNOSIS — M542 Cervicalgia: Secondary | ICD-10-CM

## 2022-03-23 DIAGNOSIS — E782 Mixed hyperlipidemia: Secondary | ICD-10-CM | POA: Diagnosis not present

## 2022-03-23 DIAGNOSIS — R918 Other nonspecific abnormal finding of lung field: Secondary | ICD-10-CM | POA: Diagnosis not present

## 2022-03-23 DIAGNOSIS — Z23 Encounter for immunization: Secondary | ICD-10-CM | POA: Diagnosis not present

## 2022-03-23 DIAGNOSIS — M5441 Lumbago with sciatica, right side: Secondary | ICD-10-CM | POA: Diagnosis not present

## 2022-03-23 DIAGNOSIS — N1831 Chronic kidney disease, stage 3a: Secondary | ICD-10-CM | POA: Diagnosis not present

## 2022-03-23 DIAGNOSIS — M4802 Spinal stenosis, cervical region: Secondary | ICD-10-CM | POA: Diagnosis not present

## 2022-03-23 DIAGNOSIS — I10 Essential (primary) hypertension: Secondary | ICD-10-CM | POA: Diagnosis not present

## 2022-03-23 DIAGNOSIS — K219 Gastro-esophageal reflux disease without esophagitis: Secondary | ICD-10-CM | POA: Diagnosis not present

## 2022-03-23 DIAGNOSIS — D75838 Other thrombocytosis: Secondary | ICD-10-CM | POA: Diagnosis not present

## 2022-03-23 DIAGNOSIS — E1369 Other specified diabetes mellitus with other specified complication: Secondary | ICD-10-CM | POA: Diagnosis not present

## 2022-03-23 DIAGNOSIS — K589 Irritable bowel syndrome without diarrhea: Secondary | ICD-10-CM | POA: Diagnosis not present

## 2022-03-31 DIAGNOSIS — N1831 Chronic kidney disease, stage 3a: Secondary | ICD-10-CM | POA: Diagnosis not present

## 2022-03-31 DIAGNOSIS — I129 Hypertensive chronic kidney disease with stage 1 through stage 4 chronic kidney disease, or unspecified chronic kidney disease: Secondary | ICD-10-CM | POA: Diagnosis not present

## 2022-03-31 DIAGNOSIS — E1169 Type 2 diabetes mellitus with other specified complication: Secondary | ICD-10-CM | POA: Diagnosis not present

## 2022-03-31 DIAGNOSIS — E782 Mixed hyperlipidemia: Secondary | ICD-10-CM | POA: Diagnosis not present

## 2022-04-10 DIAGNOSIS — R918 Other nonspecific abnormal finding of lung field: Secondary | ICD-10-CM | POA: Diagnosis not present

## 2022-04-10 DIAGNOSIS — M810 Age-related osteoporosis without current pathological fracture: Secondary | ICD-10-CM | POA: Diagnosis not present

## 2022-04-10 DIAGNOSIS — E1369 Other specified diabetes mellitus with other specified complication: Secondary | ICD-10-CM | POA: Diagnosis not present

## 2022-04-10 DIAGNOSIS — R221 Localized swelling, mass and lump, neck: Secondary | ICD-10-CM | POA: Diagnosis not present

## 2022-04-10 DIAGNOSIS — M5441 Lumbago with sciatica, right side: Secondary | ICD-10-CM | POA: Diagnosis not present

## 2022-04-10 DIAGNOSIS — N1831 Chronic kidney disease, stage 3a: Secondary | ICD-10-CM | POA: Diagnosis not present

## 2022-04-10 DIAGNOSIS — M4802 Spinal stenosis, cervical region: Secondary | ICD-10-CM | POA: Diagnosis not present

## 2022-04-10 DIAGNOSIS — D75838 Other thrombocytosis: Secondary | ICD-10-CM | POA: Diagnosis not present

## 2022-04-10 DIAGNOSIS — I10 Essential (primary) hypertension: Secondary | ICD-10-CM | POA: Diagnosis not present

## 2022-04-10 DIAGNOSIS — K589 Irritable bowel syndrome without diarrhea: Secondary | ICD-10-CM | POA: Diagnosis not present

## 2022-04-10 DIAGNOSIS — E441 Mild protein-calorie malnutrition: Secondary | ICD-10-CM | POA: Diagnosis not present

## 2022-04-28 ENCOUNTER — Other Ambulatory Visit: Payer: Self-pay

## 2022-04-28 ENCOUNTER — Encounter (HOSPITAL_COMMUNITY): Payer: Self-pay

## 2022-04-28 ENCOUNTER — Emergency Department (HOSPITAL_COMMUNITY): Payer: Medicare Other

## 2022-04-28 ENCOUNTER — Emergency Department (HOSPITAL_COMMUNITY)
Admission: EM | Admit: 2022-04-28 | Discharge: 2022-04-28 | Disposition: A | Discharge status: Home / Self Care | Payer: Medicare Other | Attending: Emergency Medicine | Admitting: Emergency Medicine

## 2022-04-28 DIAGNOSIS — I10 Essential (primary) hypertension: Secondary | ICD-10-CM | POA: Diagnosis not present

## 2022-04-28 DIAGNOSIS — M79604 Pain in right leg: Secondary | ICD-10-CM | POA: Diagnosis not present

## 2022-04-28 DIAGNOSIS — R109 Unspecified abdominal pain: Secondary | ICD-10-CM | POA: Diagnosis not present

## 2022-04-28 DIAGNOSIS — R0602 Shortness of breath: Secondary | ICD-10-CM | POA: Insufficient documentation

## 2022-04-28 DIAGNOSIS — G8929 Other chronic pain: Secondary | ICD-10-CM | POA: Diagnosis not present

## 2022-04-28 DIAGNOSIS — N200 Calculus of kidney: Secondary | ICD-10-CM | POA: Diagnosis not present

## 2022-04-28 DIAGNOSIS — N281 Cyst of kidney, acquired: Secondary | ICD-10-CM | POA: Diagnosis not present

## 2022-04-28 LAB — COMPREHENSIVE METABOLIC PANEL
ALT: 18 U/L (ref 0–44)
AST: 24 U/L (ref 15–41)
Albumin: 3.8 g/dL (ref 3.5–5.0)
Alkaline Phosphatase: 46 U/L (ref 38–126)
Anion gap: 10 (ref 5–15)
BUN: 13 mg/dL (ref 8–23)
CO2: 18 mmol/L — ABNORMAL LOW (ref 22–32)
Calcium: 8.6 mg/dL — ABNORMAL LOW (ref 8.9–10.3)
Chloride: 110 mmol/L (ref 98–111)
Creatinine, Ser: 0.93 mg/dL (ref 0.44–1.00)
GFR, Estimated: >60 mL/min (ref >60)
Glucose, Bld: 112 mg/dL — ABNORMAL HIGH (ref 70–99)
Potassium: 3.4 mmol/L — ABNORMAL LOW (ref 3.5–5.1)
Sodium: 138 mmol/L (ref 135–145)
Total Bilirubin: 0.9 mg/dL (ref 0.3–1.2)
Total Protein: 7.6 g/dL (ref 6.5–8.1)

## 2022-04-28 LAB — URINALYSIS, ROUTINE W REFLEX MICROSCOPIC
Bilirubin Urine: NEGATIVE
Glucose, UA: NEGATIVE mg/dL
Hgb urine dipstick: NEGATIVE
Ketones, ur: NEGATIVE mg/dL
Leukocytes,Ua: NEGATIVE
Nitrite: NEGATIVE
Protein, ur: NEGATIVE mg/dL
Specific Gravity, Urine: 1.039 — ABNORMAL HIGH (ref 1.005–1.030)
pH: 7 (ref 5.0–8.0)

## 2022-04-28 LAB — BLOOD GAS, VENOUS
Acid-base deficit: 0.5 mmol/L (ref 0.0–2.0)
Bicarbonate: 24.2 mmol/L (ref 20.0–28.0)
Drawn by: 7012
O2 Saturation: 75.3 %
Patient temperature: 37
pCO2, Ven: 39 mmHg — ABNORMAL LOW (ref 44–60)
pH, Ven: 7.4 (ref 7.25–7.43)
pO2, Ven: 41 mmHg (ref 32–45)

## 2022-04-28 LAB — CBC WITH DIFFERENTIAL/PLATELET
Abs Immature Granulocytes: 0.01 10*3/uL (ref 0.00–0.07)
Basophils Absolute: 0.1 10*3/uL (ref 0.0–0.1)
Basophils Relative: 1 %
Eosinophils Absolute: 0.1 10*3/uL (ref 0.0–0.5)
Eosinophils Relative: 2 %
HCT: 40 % (ref 36.0–46.0)
Hemoglobin: 13.2 g/dL (ref 12.0–15.0)
Immature Granulocytes: 0 %
Lymphocytes Relative: 40 %
Lymphs Abs: 2.1 10*3/uL (ref 0.7–4.0)
MCH: 30.6 pg (ref 26.0–34.0)
MCHC: 33 g/dL (ref 30.0–36.0)
MCV: 92.8 fL (ref 80.0–100.0)
Monocytes Absolute: 0.4 10*3/uL (ref 0.1–1.0)
Monocytes Relative: 7 %
Neutro Abs: 2.7 10*3/uL (ref 1.7–7.7)
Neutrophils Relative %: 50 %
Platelets: 335 10*3/uL (ref 150–400)
RBC: 4.31 MIL/uL (ref 3.87–5.11)
RDW: 13 % (ref 11.5–15.5)
WBC: 5.4 10*3/uL (ref 4.0–10.5)
nRBC: 0 % (ref 0.0–0.2)

## 2022-04-28 LAB — ACETAMINOPHEN LEVEL: Acetaminophen (Tylenol), Serum: 16 ug/mL (ref 10–30)

## 2022-04-28 MED ORDER — IOHEXOL 300 MG/ML  SOLN
100.0000 mL | Freq: Once | INTRAMUSCULAR | Status: AC | PRN
  Administered 2022-04-28: 100 mL via INTRAVENOUS

## 2022-04-28 MED ORDER — OXYCODONE HCL 5 MG PO TABS: 10.0000 mg | ORAL_TABLET | Freq: Once | ORAL | Status: DC

## 2022-04-28 MED ORDER — OXYCODONE HCL 5 MG PO TABS
5.0000 mg | ORAL_TABLET | Freq: Once | ORAL | Status: AC
  Administered 2022-04-28: 5 mg via ORAL
  Filled 2022-04-28: qty 1

## 2022-04-28 NOTE — ED Triage Notes (Signed)
Patient states right leg pain with left sided abdominal pain for a couple of weeks. Patient also complains of diarrhea and SHOB when she walks. Patient states doctor gave her a shot for right leg pain and patient is prescribed albuterol PRN for [facility].

## 2022-04-28 NOTE — ED Provider Notes (Signed)
[facility] EMERGENCY DEPARTMENT Provider Note   CSN: [id number] Arrival date & time: 04/28/22  1007     History Chief Complaint  Patient presents with   Leg Pain   Shortness of Breath    HPI Audrey Johnson is a 81 y.o. female presenting for multiple concerns.  She endorses months of right leg pain, chronic abdominal pain with acute exacerbation, shortness of breath.  She states that most of the symptoms are acute on chronic.  She states that the abdominal pain had resolved until 2 days ago where it started worsening again. She denies fevers or chills, nausea vomiting, syncope or shortness of breath which is otherwise ambulatory tolerating p.o. intake.  Patient's recorded medical, surgical, social, medication list and allergies were reviewed in the Snapshot window as part of the initial history.   Review of Systems   Review of Systems  Constitutional:  Positive for fatigue. Negative for chills and fever.  HENT:  Negative for ear pain and sore throat.   Eyes:  Negative for pain and visual disturbance.  Respiratory:  Positive for shortness of breath. Negative for cough.   Cardiovascular:  Negative for chest pain and palpitations.  Gastrointestinal:  Positive for abdominal pain. Negative for vomiting.  Genitourinary:  Negative for dysuria and hematuria.  Musculoskeletal:  Negative for arthralgias and back pain.  Skin:  Negative for color change and rash.  Neurological:  Negative for seizures and syncope.  All other systems reviewed and are negative.   Physical Exam Updated Vital Signs BP (!) 155/75 (BP Location: Right Arm)   Pulse 76   Temp 98.6 F (37 C) (Oral)   Resp 20   Ht '5\' 4"'$  (1.626 m)   Wt 68 kg   SpO2 100%   BMI 25.75 kg/m  Physical Exam Vitals and nursing note reviewed.  Constitutional:      General: She is not in acute distress.    Appearance: She is well-developed.  HENT:     Head: Normocephalic and atraumatic.  Eyes:     Conjunctiva/sclera:  Conjunctivae normal.  Cardiovascular:     Rate and Rhythm: Normal rate and regular rhythm.     Heart sounds: No murmur heard. Pulmonary:     Effort: Pulmonary effort is normal. No respiratory distress.     Breath sounds: Normal breath sounds.  Abdominal:     Palpations: Abdomen is soft.     Tenderness: There is no abdominal tenderness.  Musculoskeletal:        General: No swelling.     Cervical back: Neck supple.  Skin:    General: Skin is warm and dry.     Capillary Refill: Capillary refill takes less than 2 seconds.  Neurological:     Mental Status: She is alert.  Psychiatric:        Mood and Affect: Mood normal.      ED Course/ Medical Decision Making/ A&P    Procedures Procedures   Medications Ordered in ED Medications  oxyCODONE (Oxy IR/ROXICODONE) immediate release tablet 5 mg (5 mg Oral Given 04/28/22 1129)  iohexol (OMNIPAQUE) 300 MG/ML solution 100 mL (100 mLs Intravenous Contrast Given 04/28/22 1221)   Medical Decision Making:   Audrey Johnson is a 81 y.o. female who presented to the ED today with abdominal pain, detailed above.    Patient's presentation is complicated by their history of multiple chronic medical conditions and advanced age.  Patient placed on continuous vitals and telemetry monitoring while in ED which was  reviewed periodically.  Complete initial physical exam performed, notably the patient  was HDS in NAD.     Reviewed and confirmed nursing documentation for past medical history, family history, social history.    Initial Assessment:   With the patient's presentation of abdominal pain, most likely diagnosis is nonspecific etiology. Other diagnoses were considered including (but not limited to) gastroenteritis, colitis, small bowel obstruction, appendicitis, cholecystitis, pancreatitis, nephrolithiasis, UTI, pyleonephritis, ovarian torsion. These are considered less likely due to history of present illness and physical exam findings.   This  is most consistent with an acute life/limb threatening illness complicated by underlying chronic conditions.   Initial Plan:  CBC/CMP to evaluate for underlying infectious/metabolic etiology for patient's abdominal pain  Lipase to evaluate for pancreatitis  EKG to evaluate for cardiac source of pain  CTAB/Pelvis with contrast to evaluate for structural/surgical etiology of patients' severe abdominal pain.  Urinalysis and repeat physical assessment to evaluate for UTI/Pyelonpehritis  Empiric management of symptoms with escalating pain control and antiemetics as needed.   Initial Study Results:   Laboratory  All laboratory results reviewed without evidence of clinically relevant pathology.      Radiology All images reviewed independently. Agree with radiology report at this time.   CT ABDOMEN PELVIS W CONTRAST  Result Date: 04/28/2022 CLINICAL DATA:  Abdominal pain EXAM: CT ABDOMEN AND PELVIS WITH CONTRAST TECHNIQUE: Multidetector CT imaging of the abdomen and pelvis was performed using the standard protocol following bolus administration of intravenous contrast. RADIATION DOSE REDUCTION: This exam was performed according to the departmental dose-optimization program which includes automated exposure control, adjustment of the mA and/or kV according to patient size and/or use of iterative reconstruction technique. CONTRAST:  111m OMNIPAQUE IOHEXOL 300 MG/ML  SOLN COMPARISON:  04/24/2021 FINDINGS: Lower chest: Visualized lower lung fields are essentially clear. Scattered coronary artery calcifications are seen. Hepatobiliary: No focal abnormalities are seen in liver. There is slight prominence of bile ducts, possibly due to previous cholecystectomy. Distal common bile duct in the head of the pancreas measures 9 mm. Pancreas: No focal abnormalities are seen. Spleen: Unremarkable. Adrenals/Urinary Tract: Adrenals are unremarkable. There is 3 mm high density in the lower pole of the right kidney.  There is possible 8 mm high density in the upper pole of right kidney. There are few smooth marginated low-density lesions in both kidneys largest measuring 2.2 cm in the upper pole of right kidney. No follow-up imaging is recommended. Urinary bladder is unremarkable. Stomach/Bowel: Stomach is unremarkable. Small bowel loops are not dilated. Appendix is difficult to visualize in image 63 of series 2, there is a small caliber tubular structure in right lower quadrant, possibly normal appendix. There is no pericecal inflammation. There is no significant wall thickening in colon there is no pericolic stranding. Vascular/Lymphatic: Scattered atherosclerotic plaques and calcifications are seen in aorta and its major branches. Reproductive: Uterus is not seen. Other: There is no ascites or pneumoperitoneum. Umbilical hernia containing fat is seen. Musculoskeletal: Degenerative changes are noted in lumbar spine with spinal stenosis and encroachment of neural foramina at multiple levels. IMPRESSION: There is no evidence of intestinal obstruction or pneumoperitoneum. There is no hydronephrosis. There are ill-defined high density foci in the upper and lower poles of right kidney which may suggest artifacts due to early excretion of contrast into the collecting systems or nonobstructing calculi. This finding was not evident in the previous CT abdomen done on 04/24/2021 and CT lumbar spine done on 10/12/2021 making the possibility of early excretion of  contrast causing the high density more likely. There are few bilateral renal cysts.  No follow-up is recommended. Slight prominence of bile ducts may be due to previous cholecystectomy. Lumbar spondylosis. Aortic arteriosclerosis. There are scattered coronary artery calcifications. Electronically Signed   By: Elmer Picker M.D.   On: 04/28/2022 12:56   DG Chest Portable 1 View  Result Date: 04/28/2022 CLINICAL DATA:  Shortness of breath. EXAM: PORTABLE CHEST 1 VIEW  COMPARISON:  04/24/2020 FINDINGS: The lungs are clear without focal pneumonia, edema, pneumothorax or pleural effusion. The cardiopericardial silhouette is within normal limits for size. The visualized bony structures of the thorax are unremarkable. IMPRESSION: No active disease. Electronically Signed   By: Misty Stanley M.D.   On: 04/28/2022 11:32     Final Reassessment and Plan:   On repeat evaluation, patient is grossly asymptomatic.  She denies fevers or chills, nausea vomiting compassing shortness of breath.  She is now ambulatory and tolerating p.o. intake with no acute pain.  Favor the patient's presentation was likely more secondary to her chronic back pain rather than any new acute intra-abdominal pathology.  Given resolution of all of her acute symptoms, there is no acute indication for further intervention at this time.  We will have patient follow-up with her primary care provider in the outpatient setting with strict return precautions reinforced.  Disposition:  I have considered need for hospitalization, however, patient's current presentation favors discharge at this time.  Patient/family educated about specific return precautions for given chief complaint and symptoms.  Patient/family educated about follow-up with PCP .     Patient/family expressed understanding of return precautions and need for follow-up. Patient spoken to regarding all imaging and laboratory results and appropriate follow up for these results. All education provided in verbal form with additional information in written form. Time was allowed for answering of patient questions. Patient discharged.    Emergency Department Medication Summary:   Medications  oxyCODONE (Oxy IR/ROXICODONE) immediate release tablet 5 mg (5 mg Oral Given 04/28/22 1129)  iohexol (OMNIPAQUE) 300 MG/ML solution 100 mL (100 mLs Intravenous Contrast Given 04/28/22 1221)            Clinical Impression:  1. Right leg pain       Discharge   Final Clinical Impression(s) / ED Diagnoses Final diagnoses:  Right leg pain    Rx / DC Orders ED Discharge Orders     None         Tretha Sciara, MD 04/28/22 1331

## 2022-05-03 DIAGNOSIS — M5416 Radiculopathy, lumbar region: Secondary | ICD-10-CM | POA: Diagnosis not present

## 2022-05-05 ENCOUNTER — Ambulatory Visit
Admission: EM | Admit: 2022-05-05 | Discharge: 2022-05-05 | Disposition: A | Discharge status: Home / Self Care | Payer: Medicare Other | Attending: Family Medicine | Admitting: Family Medicine

## 2022-05-05 DIAGNOSIS — R112 Nausea with vomiting, unspecified: Secondary | ICD-10-CM | POA: Diagnosis not present

## 2022-05-05 DIAGNOSIS — R6883 Chills (without fever): Secondary | ICD-10-CM | POA: Diagnosis not present

## 2022-05-05 DIAGNOSIS — R1030 Lower abdominal pain, unspecified: Secondary | ICD-10-CM | POA: Diagnosis not present

## 2022-05-05 DIAGNOSIS — R197 Diarrhea, unspecified: Secondary | ICD-10-CM | POA: Diagnosis not present

## 2022-05-05 LAB — POCT URINALYSIS DIP (MANUAL ENTRY)
Bilirubin, UA: NEGATIVE
Glucose, UA: NEGATIVE mg/dL
Ketones, POC UA: NEGATIVE mg/dL
Leukocytes, UA: NEGATIVE
Nitrite, UA: NEGATIVE
Protein Ur, POC: 30 mg/dL — AB
Spec Grav, UA: 1.025 (ref 1.010–1.025)
Urobilinogen, UA: 1 E.U./dL
pH, UA: 7 (ref 5.0–8.0)

## 2022-05-05 MED ORDER — LOPERAMIDE HCL 2 MG PO CAPS: 2.0000 mg | ORAL_CAPSULE | Freq: Four times a day (QID) | ORAL | 0 refills | Status: DC | PRN

## 2022-05-05 MED ORDER — ONDANSETRON 4 MG PO TBDP: 4.0000 mg | ORAL_TABLET | Freq: Three times a day (TID) | ORAL | 0 refills | Status: DC | PRN

## 2022-05-05 NOTE — ED Triage Notes (Signed)
Pt also reorts having low abdominal pain and low back pain. No problems urinating.

## 2022-05-05 NOTE — ED Notes (Signed)
Unable to obtain blood sample at this time. Provider aware and recommended pt hydrate at home and return to UC this afternoon for blood work. pt stated would go home and hydrate and return to [facility] today. Pt aware of [facility] hours.

## 2022-05-05 NOTE — ED Notes (Signed)
Pt returned at 1332 and blood specimen was obtained. Pt tolerated well.

## 2022-05-05 NOTE — Discharge Instructions (Signed)
Stay well-hydrated, eat bland foods and take the medications as needed that I have prescribed.  We will check in with you tomorrow once your lab results are back and see how you are doing and if we need to make any changes.

## 2022-05-05 NOTE — ED Triage Notes (Signed)
Pt reports stomach hurt had a virus, she is weak, has hot flashes, sob for about 2 months now. Was seen in the ED on 04/28/22 and reports not getting any medication for symptoms. Took gabepentin, tylenol which provided slight relief. Bowels are really loose and can't keep food down.

## 2022-05-06 LAB — CBC WITH DIFFERENTIAL/PLATELET
Basophils Absolute: 0.1 10*3/uL (ref 0.0–0.2)
Basos: 1 %
EOS (ABSOLUTE): 0.1 10*3/uL (ref 0.0–0.4)
Eos: 3 %
Hematocrit: 39.5 % (ref 34.0–46.6)
Hemoglobin: 12.8 g/dL (ref 11.1–15.9)
Immature Grans (Abs): 0 10*3/uL (ref 0.0–0.1)
Immature Granulocytes: 0 %
Lymphocytes Absolute: 1.5 10*3/uL (ref 0.7–3.1)
Lymphs: 31 %
MCH: 30.1 pg (ref 26.6–33.0)
MCHC: 32.4 g/dL (ref 31.5–35.7)
MCV: 93 fL (ref 79–97)
Monocytes Absolute: 0.4 10*3/uL (ref 0.1–0.9)
Monocytes: 8 %
Neutrophils Absolute: 2.8 10*3/uL (ref 1.4–7.0)
Neutrophils: 57 %
Platelets: 341 10*3/uL (ref 150–450)
RBC: 4.25 x10E6/uL (ref 3.77–5.28)
RDW: 12.9 % (ref 11.7–15.4)
WBC: 4.8 10*3/uL (ref 3.4–10.8)

## 2022-05-06 LAB — COMPREHENSIVE METABOLIC PANEL
ALT: 19 IU/L (ref 0–32)
AST: 21 IU/L (ref 0–40)
Albumin/Globulin Ratio: 1.6 (ref 1.2–2.2)
Albumin: 4.3 g/dL (ref 3.8–4.8)
Alkaline Phosphatase: 54 IU/L (ref 44–121)
BUN/Creatinine Ratio: 11 — ABNORMAL LOW (ref 12–28)
BUN: 11 mg/dL (ref 8–27)
Bilirubin Total: 0.4 mg/dL (ref 0.0–1.2)
CO2: 14 mmol/L — ABNORMAL LOW (ref 20–29)
Calcium: 9.3 mg/dL (ref 8.7–10.3)
Chloride: 112 mmol/L — ABNORMAL HIGH (ref 96–106)
Creatinine, Ser: 0.96 mg/dL (ref 0.57–1.00)
Globulin, Total: 2.7 g/dL (ref 1.5–4.5)
Glucose: 154 mg/dL — ABNORMAL HIGH (ref 70–99)
Potassium: 4.1 mmol/L (ref 3.5–5.2)
Sodium: 145 mmol/L — ABNORMAL HIGH (ref 134–144)
Total Protein: 7 g/dL (ref 6.0–8.5)
eGFR: 60 mL/min/{1.73_m2} (ref >59)
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
## 2022-05-22 DIAGNOSIS — M5416 Radiculopathy, lumbar region: Secondary | ICD-10-CM | POA: Diagnosis not present

## 2022-05-30 ENCOUNTER — Telehealth: Payer: Self-pay | Admitting: *Deleted

## 2022-06-01 ENCOUNTER — Telehealth: Payer: Self-pay | Admitting: *Deleted

## 2022-06-01 NOTE — Patient Outreach (Signed)
  Care Coordination   06/01/2022 Name: Audrey Johnson MRN: [id number] DOB: 05/12/1941   Care Coordination Outreach Attempts:  A second unsuccessful outreach was attempted today to offer the patient with information about available care coordination services as a benefit of their health plan.     Follow Up Plan:  Additional outreach attempts will be made to offer the patient care coordination information and services.   Encounter Outcome:  No Answer   Care Coordination Interventions:  No, not indicated    Sathvik Tiedt C. Myrtie Neither, MSN, [facility] Gerontological Nurse Practitioner [facility] Care Management [phone]

## 2022-06-21 DIAGNOSIS — M5416 Radiculopathy, lumbar region: Secondary | ICD-10-CM | POA: Diagnosis not present

## 2022-06-21 DIAGNOSIS — M5030 Other cervical disc degeneration, unspecified cervical region: Secondary | ICD-10-CM | POA: Diagnosis not present

## 2022-06-21 DIAGNOSIS — M4712 Other spondylosis with myelopathy, cervical region: Secondary | ICD-10-CM | POA: Diagnosis not present

## 2022-06-21 DIAGNOSIS — M5126 Other intervertebral disc displacement, lumbar region: Secondary | ICD-10-CM | POA: Diagnosis not present

## 2022-06-21 DIAGNOSIS — M4802 Spinal stenosis, cervical region: Secondary | ICD-10-CM | POA: Diagnosis not present

## 2022-07-17 DIAGNOSIS — E1369 Other specified diabetes mellitus with other specified complication: Secondary | ICD-10-CM | POA: Diagnosis not present

## 2022-07-17 DIAGNOSIS — E782 Mixed hyperlipidemia: Secondary | ICD-10-CM | POA: Diagnosis not present

## 2022-07-23 DIAGNOSIS — D75838 Other thrombocytosis: Secondary | ICD-10-CM | POA: Diagnosis not present

## 2022-07-23 DIAGNOSIS — N1831 Chronic kidney disease, stage 3a: Secondary | ICD-10-CM | POA: Diagnosis not present

## 2022-07-23 DIAGNOSIS — K589 Irritable bowel syndrome without diarrhea: Secondary | ICD-10-CM | POA: Diagnosis not present

## 2022-07-23 DIAGNOSIS — R221 Localized swelling, mass and lump, neck: Secondary | ICD-10-CM | POA: Diagnosis not present

## 2022-07-23 DIAGNOSIS — E441 Mild protein-calorie malnutrition: Secondary | ICD-10-CM | POA: Diagnosis not present

## 2022-07-23 DIAGNOSIS — M5441 Lumbago with sciatica, right side: Secondary | ICD-10-CM | POA: Diagnosis not present

## 2022-07-23 DIAGNOSIS — M4802 Spinal stenosis, cervical region: Secondary | ICD-10-CM | POA: Diagnosis not present

## 2022-07-23 DIAGNOSIS — E1369 Other specified diabetes mellitus with other specified complication: Secondary | ICD-10-CM | POA: Diagnosis not present

## 2022-07-23 DIAGNOSIS — R918 Other nonspecific abnormal finding of lung field: Secondary | ICD-10-CM | POA: Diagnosis not present

## 2022-07-23 DIAGNOSIS — I129 Hypertensive chronic kidney disease with stage 1 through stage 4 chronic kidney disease, or unspecified chronic kidney disease: Secondary | ICD-10-CM | POA: Diagnosis not present

## 2022-07-23 DIAGNOSIS — E782 Mixed hyperlipidemia: Secondary | ICD-10-CM | POA: Diagnosis not present

## 2022-07-30 ENCOUNTER — Other Ambulatory Visit (HOSPITAL_COMMUNITY): Payer: Self-pay | Admitting: Internal Medicine

## 2022-07-30 DIAGNOSIS — Z1231 Encounter for screening mammogram for malignant neoplasm of breast: Secondary | ICD-10-CM

## 2022-08-06 ENCOUNTER — Ambulatory Visit (HOSPITAL_COMMUNITY)
Admission: RE | Admit: 2022-08-06 | Discharge: 2022-08-06 | Disposition: A | Discharge status: Home / Self Care | Payer: Medicare Other | Source: Ambulatory Visit | Attending: Internal Medicine | Admitting: Internal Medicine

## 2022-08-06 DIAGNOSIS — Z1231 Encounter for screening mammogram for malignant neoplasm of breast: Secondary | ICD-10-CM | POA: Insufficient documentation

## 2022-08-23 DIAGNOSIS — M5416 Radiculopathy, lumbar region: Secondary | ICD-10-CM | POA: Diagnosis not present

## 2022-09-26 ENCOUNTER — Encounter: Payer: Self-pay | Admitting: *Deleted

## 2022-09-26 ENCOUNTER — Ambulatory Visit: Payer: Self-pay | Admitting: *Deleted

## 2022-09-26 NOTE — Patient Outreach (Signed)
  Care Coordination   Initial Visit Note   09/26/2022  Name: Audrey Johnson MRN: [id number] DOB: 1941-06-07  Audrey Johnson is a 82 y.o. year old female who sees Nevada Crane, Edwinna Areola, MD for primary care. I spoke with Tennis Must by phone today.  What matters to the patients health and wellness today?  No interventions identified.  Patient denies need for social work involvement at this time.  SDOH assessments and interventions completed:  Yes.  SDOH Interventions Today    Flowsheet Row Most Recent Value  SDOH Interventions   Food Insecurity Interventions Intervention Not Indicated  Housing Interventions Intervention Not Indicated  Transportation Interventions Intervention Not Indicated, Patient Resources (Friends/Family), Payor Benefit  Utilities Interventions Intervention Not Indicated  Alcohol Usage Interventions Intervention Not Indicated (Score <7)  Financial Strain Interventions Intervention Not Indicated  Physical Activity Interventions Patient Refused  Stress Interventions Intervention Not Indicated  Social Connections Interventions Intervention Not Indicated     Care Coordination Interventions:  No, not indicated.   Follow up plan: No further intervention required.   Encounter Outcome:  Pt. Visit Completed.   Nat Christen, BSW, MSW, LCSW  Licensed Education officer, [facility]  Mailing [place] [address], [place], [place] [address] Physical Address-[address], [place], [place] [address] Toll Free Main # [phone] Fax # [phone] Cell # [phone] [email or web address]

## 2022-09-26 NOTE — Patient Instructions (Signed)
Visit Information  Thank you for taking time to visit with me today. Please don't hesitate to contact me if I can be of assistance to you.   Please call the care guide team at [phone] if you need to cancel or reschedule your appointment.   If you are experiencing a Mental Health or Behavioral Health Crisis or need someone to talk to, please call the Suicide and Crisis Lifeline: 988 call the [place] National Suicide Prevention Lifeline: 1-800-273-8255 or TTY: 1-800-799-4 TTY (1-800-799-4889) to talk to a trained counselor call 1-800-273-TALK (toll free, 24 hour hotline) go to [facility] [address], [place] ([phone]) call the [place] Crisis Line: 800-939-9988 call 911  Patient verbalizes understanding of instructions and care plan provided today and agrees to view in MyChart. Active MyChart status and patient understanding of how to access instructions and care plan via MyChart confirmed with patient.     No further follow up required.  Kim Oki, BSW, MSW, LCSW  Licensed Clinical Social Worker  [facility] Care Management [place] [facility]  Mailing Address-[address], [place], [place] [address] Physical Address-[address], [place], [place] [address] Toll Free Main # [phone] Fax # [phone] Cell # [phone] [email or web address]            

## 2022-10-25 DIAGNOSIS — M810 Age-related osteoporosis without current pathological fracture: Secondary | ICD-10-CM | POA: Diagnosis not present

## 2022-11-15 DIAGNOSIS — E782 Mixed hyperlipidemia: Secondary | ICD-10-CM | POA: Diagnosis not present

## 2022-11-15 DIAGNOSIS — E1369 Other specified diabetes mellitus with other specified complication: Secondary | ICD-10-CM | POA: Diagnosis not present

## 2022-11-21 ENCOUNTER — Other Ambulatory Visit (HOSPITAL_COMMUNITY): Payer: Self-pay | Admitting: Internal Medicine

## 2022-11-21 DIAGNOSIS — I10 Essential (primary) hypertension: Secondary | ICD-10-CM | POA: Diagnosis not present

## 2022-11-21 DIAGNOSIS — R911 Solitary pulmonary nodule: Secondary | ICD-10-CM | POA: Diagnosis not present

## 2022-11-21 DIAGNOSIS — I129 Hypertensive chronic kidney disease with stage 1 through stage 4 chronic kidney disease, or unspecified chronic kidney disease: Secondary | ICD-10-CM | POA: Diagnosis not present

## 2022-11-21 DIAGNOSIS — K589 Irritable bowel syndrome without diarrhea: Secondary | ICD-10-CM | POA: Diagnosis not present

## 2022-11-21 DIAGNOSIS — E1369 Other specified diabetes mellitus with other specified complication: Secondary | ICD-10-CM | POA: Diagnosis not present

## 2022-11-21 DIAGNOSIS — D75838 Other thrombocytosis: Secondary | ICD-10-CM | POA: Diagnosis not present

## 2022-11-21 DIAGNOSIS — E782 Mixed hyperlipidemia: Secondary | ICD-10-CM | POA: Diagnosis not present

## 2022-11-21 DIAGNOSIS — M4802 Spinal stenosis, cervical region: Secondary | ICD-10-CM | POA: Diagnosis not present

## 2022-11-21 DIAGNOSIS — N1831 Chronic kidney disease, stage 3a: Secondary | ICD-10-CM | POA: Diagnosis not present

## 2022-11-21 DIAGNOSIS — M5441 Lumbago with sciatica, right side: Secondary | ICD-10-CM | POA: Diagnosis not present

## 2022-11-21 DIAGNOSIS — E1169 Type 2 diabetes mellitus with other specified complication: Secondary | ICD-10-CM | POA: Diagnosis not present

## 2022-11-22 ENCOUNTER — Encounter: Payer: Self-pay | Admitting: *Deleted

## 2022-12-23 ENCOUNTER — Encounter: Payer: Self-pay | Admitting: Gastroenterology
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
## 2022-12-26 ENCOUNTER — Encounter: Payer: Self-pay | Admitting: Gastroenterology

## 2022-12-26 ENCOUNTER — Ambulatory Visit (INDEPENDENT_AMBULATORY_CARE_PROVIDER_SITE_OTHER): Payer: Medicare Other | Admitting: Gastroenterology

## 2022-12-26 VITALS — BP 134/82 | HR 69 | Temp 97.9°F | Ht 64.0 in | Wt 158.2 lb

## 2022-12-26 DIAGNOSIS — Z1211 Encounter for screening for malignant neoplasm of colon: Secondary | ICD-10-CM

## 2022-12-26 NOTE — Patient Instructions (Signed)
Will arrange for you to have a colonoscopy in the near future with Dr. Jena Gauss at [facility].  Will follow-up with you in the office as needed.  Do not hesitate to call if you have any new GI concerns.  It was nice to meet you today!  Ermalinda Memos, PA-C [facility]astro[facility]

## 2022-12-27 ENCOUNTER — Telehealth: Payer: Self-pay | Admitting: *Deleted

## 2022-12-27 NOTE — Telephone Encounter (Signed)
Pt called in and left a VM stating she has decided she does not want to have a colonoscopy done. FYI

## 2022-12-27 NOTE — Telephone Encounter (Signed)
Noted  

## 2023-01-09 ENCOUNTER — Ambulatory Visit (HOSPITAL_COMMUNITY)
Admission: RE | Admit: 2023-01-09 | Discharge: 2023-01-09 | Disposition: A | Discharge status: Home / Self Care | Payer: Medicare Other | Source: Ambulatory Visit | Attending: Internal Medicine | Admitting: Internal Medicine

## 2023-01-09 DIAGNOSIS — R0602 Shortness of breath: Secondary | ICD-10-CM | POA: Diagnosis not present

## 2023-01-09 DIAGNOSIS — R911 Solitary pulmonary nodule: Secondary | ICD-10-CM | POA: Insufficient documentation

## 2023-01-09 DIAGNOSIS — I700 Atherosclerosis of aorta: Secondary | ICD-10-CM | POA: Diagnosis not present

## 2023-02-26 ENCOUNTER — Other Ambulatory Visit: Payer: Self-pay | Admitting: Family Medicine

## 2023-02-27 NOTE — Telephone Encounter (Signed)
Unable to refill per protocol, last refill by another provider not at this practice.  Requested Prescriptions  Pending Prescriptions Disp Refills   loperamide (IMODIUM) 2 MG capsule [Pharmacy Med Name: LOPERAMIDE 2MG  CAPS 2 Capsule] 12 capsule 10    Sig: TAKE ONE (1) CAPSULE BY MOUTH FOUR TIMES A DAY AS NEEDED FOR DIARRHEA OR LOOSE STOOLS *REFILL REQUEST*     There is no refill protocol information for this order     ondansetron (ZOFRAN-ODT) 4 MG disintegrating tablet [Pharmacy Med Name: ONDANSETRON 4MG  ODT TAB 4 ODT] 20 tablet 10    Sig: PLACE 1 TABLET ON TONGUE AND ALLOW TO DISSOLVE EVERY 8 HOURS AS NEEDED FOR NAUSEA/VOMITING *REFILL REQUEST*     There is no refill protocol information for this order

## 2023-03-13 DIAGNOSIS — E782 Mixed hyperlipidemia: Secondary | ICD-10-CM | POA: Diagnosis not present

## 2023-03-13 DIAGNOSIS — E1369 Other specified diabetes mellitus with other specified complication: Secondary | ICD-10-CM | POA: Diagnosis not present

## 2023-03-19 DIAGNOSIS — Z23 Encounter for immunization: Secondary | ICD-10-CM | POA: Diagnosis not present

## 2023-03-19 DIAGNOSIS — E1369 Other specified diabetes mellitus with other specified complication: Secondary | ICD-10-CM | POA: Diagnosis not present

## 2023-03-19 DIAGNOSIS — R079 Chest pain, unspecified: Secondary | ICD-10-CM | POA: Diagnosis not present

## 2023-03-19 DIAGNOSIS — N1831 Chronic kidney disease, stage 3a: Secondary | ICD-10-CM | POA: Diagnosis not present

## 2023-03-19 DIAGNOSIS — M5441 Lumbago with sciatica, right side: Secondary | ICD-10-CM | POA: Diagnosis not present

## 2023-03-19 DIAGNOSIS — M4802 Spinal stenosis, cervical region: Secondary | ICD-10-CM | POA: Diagnosis not present

## 2023-03-19 DIAGNOSIS — D75838 Other thrombocytosis: Secondary | ICD-10-CM | POA: Diagnosis not present

## 2023-03-19 DIAGNOSIS — E782 Mixed hyperlipidemia: Secondary | ICD-10-CM | POA: Diagnosis not present

## 2023-03-19 DIAGNOSIS — I129 Hypertensive chronic kidney disease with stage 1 through stage 4 chronic kidney disease, or unspecified chronic kidney disease: Secondary | ICD-10-CM | POA: Diagnosis not present

## 2023-03-19 DIAGNOSIS — R42 Dizziness and giddiness: Secondary | ICD-10-CM | POA: Diagnosis not present

## 2023-03-19 DIAGNOSIS — Z0001 Encounter for general adult medical examination with abnormal findings: Secondary | ICD-10-CM | POA: Diagnosis not present

## 2023-04-09 ENCOUNTER — Ambulatory Visit: Payer: Medicare Other | Admitting: Student

## 2023-04-10 ENCOUNTER — Ambulatory Visit: Payer: Medicare Other | Attending: Student | Admitting: Student

## 2023-04-10 ENCOUNTER — Encounter: Payer: Self-pay | Admitting: Student

## 2023-04-10 VITALS — BP 128/72 | HR 67 | Ht 64.0 in | Wt 155.6 lb

## 2023-04-10 DIAGNOSIS — I10 Essential (primary) hypertension: Secondary | ICD-10-CM

## 2023-04-10 DIAGNOSIS — R079 Chest pain, unspecified: Secondary | ICD-10-CM | POA: Diagnosis not present

## 2023-04-10 DIAGNOSIS — E785 Hyperlipidemia, unspecified: Secondary | ICD-10-CM | POA: Diagnosis not present

## 2023-04-10 NOTE — Patient Instructions (Signed)
Medication Instructions:  Your physician recommends that you continue on your current medications as directed. Please refer to the Current Medication list given to you today.  *If you need a refill on your cardiac medications before your next appointment, please call your pharmacy*   Lab Work: None If you have labs (blood work) drawn today and your tests are completely normal, you will receive your results only by: MyChart Message (if you have MyChart) OR A paper copy in the mail If you have any lab test that is abnormal or we need to change your treatment, we will call you to review the results.   Testing/Procedures: Your physician has requested that you have a lexiscan myoview. For further information please visit [email or web address] Please follow instruction sheet, as given.    Follow-Up: At [facility], you and your health needs are our priority.  As part of our continuing mission to provide you with exceptional heart care, we have created designated Provider Care Teams.  These Care Teams include your primary Cardiologist (physician) and Advanced Practice Providers (APPs -  Physician Assistants and Nurse Practitioners) who all work together to provide you with the care you need, when you need it.  We recommend signing up for the patient portal called "MyChart".  Sign up information is provided on this After Visit Summary.  MyChart is used to connect with patients for Virtual Visits (Telemedicine).  Patients are able to view lab/test results, encounter notes, upcoming appointments, etc.  Non-urgent messages can be sent to your provider as well.   To learn more about what you can do with MyChart, go to [email or web address].    Your next appointment:   1 year(s)  Provider:   You may see Nona Dell, MD or one of the following Advanced Practice Providers on your designated Care Team:   Randall An, PA-C  Jacolyn Reedy, PA-C     Other Instructions  \

## 2023-04-10 NOTE — Progress Notes (Signed)
Cardiology Office Note    Date:  04/10/2023  ID:  Audrey Johnson, Audrey Johnson [birth year], MRN [id number] Cardiologist: Nona Dell, MD    History of Present Illness:    Audrey Johnson is a 82 y.o. female with past medical history of chest pain, HTN, HLD, anxiety and depression who presents to the office today for evaluation of recurrent chest pain.  She was examined by Dr. Diona Browner in 05/2020 as a new patient referral for episodes of shortness of breath which had been occurring for months to years with associated chest discomfort. Prior cardiac catheterization in 2013 had shown no significant obstructive CAD. Given the timeframe since last evaluation, an echocardiogram and Lexiscan Myoview were recommended for further evaluation.  Echocardiogram showed a preserved EF of 70 to 75% with no regional wall motion abnormalities and no significant valve abnormalities (only trivial MR). Her stress test showed no evidence of ischemia or  infarction and was overall a low-risk study.  In talking with the patient today, she reports having intermittent episodes of chest pain over the past few months. Says this can occur at rest or with activity. She does not exercise routinely but says she is active at baseline as she takes care of her 10-year-old great grandson during the day. She has noted chest discomfort when walking to keep up with him but also reports episodes at nighttime. No association with food consumption and she does take Protonix daily. Episodes typically last for 1 to 2 minutes and spontaneously resolve. She denies any dyspnea on exertion, orthopnea, PND, pitting edema or palpitations.  Studies Reviewed:   EKG: EKG is ordered today and demonstrates:   EKG Interpretation Date/Time:  Wednesday April 10 2023 14:23:34 EDT Ventricular Rate:  67 PR Interval:  146 QRS Duration:  62 QT Interval:  414 QTC Calculation: 437 R Axis:   66  Text Interpretation: Normal sinus rhythm Normal ECG  Confirmed by Randall An ([id number]) on 04/10/2023 2:35:35 PM       Echocardiogram: 05/2020 IMPRESSIONS     1. Left ventricular ejection fraction, by estimation, is 70 to 75%. The  left ventricle has hyperdynamic function. The left ventricle has no  regional wall motion abnormalities. There is mild left ventricular  hypertrophy. Left ventricular diastolic  parameters are indeterminate.   2. Right ventricular systolic function is normal. The right ventricular  size is normal. There is normal pulmonary artery systolic pressure. The  estimated right ventricular systolic pressure is 17.4 mmHg.   3. The mitral valve is grossly normal. Trivial mitral valve  regurgitation.   4. The aortic valve is tricuspid. Aortic valve regurgitation is not  visualized.   5. The inferior vena cava is normal in size with greater than 50%  respiratory variability, suggesting right atrial pressure of 3 mmHg.   NST: 05/2020 No diagnostic ST segment changes to indicate ischemia. No significant myocardial perfusion defects to indicate scar or ischemia. This is a low risk study. Nuclear stress EF: 77%.   Physical Exam:   VS:  BP 128/72   Pulse 67   Ht 5\' 4"  (1.626 m)   Wt 155 lb 9.6 oz (70.6 kg)   SpO2 98%   BMI 26.71 kg/m    Wt Readings from Last 3 Encounters:  04/10/23 155 lb 9.6 oz (70.6 kg)  12/26/22 158 lb 3.2 oz (71.8 kg)  04/28/22 150 lb (68 kg)     GEN: Well nourished, well developed female appearing in in no acute distress NECK: No JVD;  No carotid bruits CARDIAC: RRR, no murmurs, rubs, gallops RESPIRATORY:  Clear to auscultation without rales, wheezing or rhonchi  ABDOMEN: Appears non-distended. No obvious abdominal masses. EXTREMITIES: No clubbing or cyanosis. No pitting edema.  Distal pedal pulses are 2+ bilaterally.   Assessment and Plan:   1. Chest Pain with Mixed Features - Her episodes of chest pain have mixed qualities as outlined above as they can occur at rest or with  activity but she does have multiple cardiac risk factors. EKG without acute ST changes. Given the timeframe since last evaluation, we reviewed repeat ischemic testing with either a Lexiscan Myoview or Coronary CTA. A Coronary CTA would be ideal but that she does not have transportation back and forth to [place] on a regular basis, therefore she prefers a Scientist, physiological here at [facility]. Will schedule within the next few weeks.   2. HTN - BP was initially elevated at 140/70, rechecked and improved to 128/72. Continue current medical therapy with Bystolic 10 mg daily and Lisinopril 10 mg daily (creatinine was at 1.09 in 03/2023).  3. HLD - LDL was at 55 in 03/2023. Continue current medical therapy with Atorvastatin 20 mg daily.  Signed, Ellsworth Lennox, PA-C

## 2023-04-18 ENCOUNTER — Telehealth (HOSPITAL_COMMUNITY): Payer: Self-pay | Admitting: Surgery

## 2023-04-18 NOTE — Telephone Encounter (Signed)
I called the pt to remind her of her stress test tomorrow at 9:30.  The pt was instructed to arrive a few min early and to check in at the front desk.  She was also instructed to not eat or drink anything 6 hours prior to the test (no caffeine or chocolate), to not smoke 8 hours prior to the test, to wear comfortable clothes and no lotions/oils, and to bring her albuterol rescue inhaler.  The pt verbalized understanding of these instructions.

## 2023-04-19 ENCOUNTER — Encounter (HOSPITAL_BASED_OUTPATIENT_CLINIC_OR_DEPARTMENT_OTHER)
Admission: RE | Admit: 2023-04-19 | Discharge: 2023-04-19 | Disposition: A | Discharge status: Home / Self Care | Payer: Medicare Other | Source: Ambulatory Visit | Attending: Student

## 2023-04-19 ENCOUNTER — Ambulatory Visit (HOSPITAL_COMMUNITY)
Admission: RE | Admit: 2023-04-19 | Discharge: 2023-04-19 | Disposition: A | Discharge status: Home / Self Care | Payer: Medicare Other | Source: Ambulatory Visit | Attending: Cardiology | Admitting: Cardiology

## 2023-04-19 DIAGNOSIS — R079 Chest pain, unspecified: Secondary | ICD-10-CM | POA: Diagnosis not present

## 2023-04-19 LAB — NM MYOCAR MULTI W/SPECT W/WALL MOTION / EF
LV dias vol: 45 mL (ref 46–106)
LV sys vol: 14 mL
Nuc Stress EF: 68 %
Peak HR: 106 {beats}/min
RATE: 0.3
Rest HR: 68 {beats}/min
Rest Nuclear Isotope Dose: 10.9 mCi
SDS: 0
SRS: 0
SSS: 0
ST Depression (mm): 0 mm
Stress Nuclear Isotope Dose: 31 mCi
TID: 1.46

## 2023-04-19 MED ORDER — TECHNETIUM TC 99M TETROFOSMIN IV KIT
31.0000 | PACK | Freq: Once | INTRAVENOUS | Status: AC | PRN
  Administered 2023-04-19: 31 via INTRAVENOUS

## 2023-04-19 MED ORDER — REGADENOSON 0.4 MG/5ML IV SOLN
INTRAVENOUS | Status: AC
  Administered 2023-04-19: 0.4 mg via INTRAVENOUS
  Filled 2023-04-19: qty 5

## 2023-04-19 MED ORDER — TECHNETIUM TC 99M TETROFOSMIN IV KIT
10.9000 | PACK | Freq: Once | INTRAVENOUS | Status: AC | PRN
  Administered 2023-04-19: 10.9 via INTRAVENOUS

## 2023-04-19 MED ORDER — SODIUM CHLORIDE FLUSH 0.9 % IV SOLN
INTRAVENOUS | Status: AC
  Administered 2023-04-19: 10 mL via INTRAVENOUS
  Filled 2023-04-19: qty 10

## 2023-05-09 DIAGNOSIS — R300 Dysuria: Secondary | ICD-10-CM | POA: Diagnosis not present

## 2023-05-09 DIAGNOSIS — K219 Gastro-esophageal reflux disease without esophagitis: Secondary | ICD-10-CM | POA: Diagnosis not present

## 2023-05-09 DIAGNOSIS — A084 Viral intestinal infection, unspecified: Secondary | ICD-10-CM | POA: Diagnosis not present

## 2023-05-09 DIAGNOSIS — I10 Essential (primary) hypertension: Secondary | ICD-10-CM | POA: Diagnosis not present

## 2023-05-09 DIAGNOSIS — I129 Hypertensive chronic kidney disease with stage 1 through stage 4 chronic kidney disease, or unspecified chronic kidney disease: Secondary | ICD-10-CM | POA: Diagnosis not present

## 2023-05-09 DIAGNOSIS — K589 Irritable bowel syndrome without diarrhea: Secondary | ICD-10-CM | POA: Diagnosis not present

## 2023-05-09 DIAGNOSIS — Z713 Dietary counseling and surveillance: Secondary | ICD-10-CM | POA: Diagnosis not present

## 2023-05-09 DIAGNOSIS — N1831 Chronic kidney disease, stage 3a: Secondary | ICD-10-CM | POA: Diagnosis not present

## 2023-05-09 DIAGNOSIS — Z79899 Other long term (current) drug therapy: Secondary | ICD-10-CM | POA: Diagnosis not present

## 2023-05-30 ENCOUNTER — Emergency Department (HOSPITAL_COMMUNITY)
Admission: EM | Admit: 2023-05-30 | Discharge: 2023-05-30 | Disposition: A | Discharge status: Home / Self Care | Payer: Medicare Other | Attending: Emergency Medicine | Admitting: Emergency Medicine

## 2023-05-30 ENCOUNTER — Other Ambulatory Visit: Payer: Self-pay

## 2023-05-30 ENCOUNTER — Encounter (HOSPITAL_COMMUNITY): Payer: Self-pay | Admitting: *Deleted

## 2023-05-30 ENCOUNTER — Emergency Department (HOSPITAL_COMMUNITY): Payer: Medicare Other

## 2023-05-30 DIAGNOSIS — R1030 Lower abdominal pain, unspecified: Secondary | ICD-10-CM | POA: Diagnosis not present

## 2023-05-30 DIAGNOSIS — R0602 Shortness of breath: Secondary | ICD-10-CM | POA: Insufficient documentation

## 2023-05-30 DIAGNOSIS — R109 Unspecified abdominal pain: Secondary | ICD-10-CM | POA: Diagnosis not present

## 2023-05-30 NOTE — Discharge Instructions (Signed)
Chest x-ray here today without any acute findings.  Recommend you follow-up with the your primary care doctor for evaluation of the intermittent abdominal pain has been ongoing for a month.  As we discussed we recommended a CT scan of your abdomen but you did not want to do that today.  Just follow-up with them.  Return for any chest pain that last 20 minutes or longer.  Oxygen levels and is stated chest x-ray looks good today.

## 2023-05-30 NOTE — ED Triage Notes (Signed)
Pt c/o sob, abdominal pain and continuous belching that has been going on for about a month  Pt c/o nausea but no vomiting

## 2023-05-30 NOTE — ED Provider Notes (Signed)
[place] EMERGENCY DEPARTMENT AT [facility] Provider Note   CSN: [id number] Arrival date & time: 05/30/23  1015     History  Chief Complaint  Patient presents with   Shortness of Breath    Audrey Johnson is a 82 y.o. female.  Patient with a month-long complaint of some intermittent lower quadrant abdominal pain associated with nausea but no vomiting or diarrhea.  Also feeling of some shortness of breath that is also intermittent.  Also with a complaint of occasional substernal chest discomfort not lasting 20 minutes or longer.  Patient has no symptoms currently.  Patient had EKG done chest x-ray done and patient's vital signs were reassuring.  Oxygen saturation was 100% blood pressure 144/73 temp 99.4 respirations 18.  Patient without any upper respiratory symptoms.       Home Medications Prior to Admission medications   Medication Sig Start Date End Date Taking? Authorizing Provider  albuterol (VENTOLIN HFA) 108 (90 Base) MCG/ACT inhaler Inhale 2 puffs into the lungs every 6 (six) hours as needed for wheezing or shortness of breath.    [provider]  atorvastatin (LIPITOR) 20 MG tablet Take 20 mg by mouth at bedtime.    Benita Stabile, MD  BYSTOLIC 10 MG tablet Take 10 mg by mouth daily. 03/04/20   [provider]  calcium-vitamin D (OSCAL WITH D) 500-200 MG-UNIT tablet Take 1 tablet by mouth.    [provider]  escitalopram (LEXAPRO) 10 MG tablet Take 10 mg by mouth daily.    [provider]  gabapentin (NEURONTIN) 300 MG capsule Take 300 mg by mouth 2 (two) times daily as needed (pain). 12/23/20   [provider]  lisinopril (PRINIVIL,ZESTRIL) 10 MG tablet Take 10 mg by mouth daily.    [provider]  pantoprazole (PROTONIX) 40 MG tablet Take 1 tablet (40 mg total) by mouth daily. 04/24/21   Terrilee Files, MD  PROLIA 60 MG/ML SOSY injection Inject into the skin. Patient not taking: Reported on 04/10/2023  08/09/21   [provider]      Allergies    Patient has no known allergies.    Review of Systems   Review of Systems  Constitutional:  Negative for chills and fever.  HENT:  Negative for ear pain and sore throat.   Eyes:  Negative for pain and visual disturbance.  Respiratory:  Positive for shortness of breath. Negative for cough.   Cardiovascular:  Positive for chest pain. Negative for palpitations.  Gastrointestinal:  Positive for abdominal pain and nausea. Negative for vomiting.  Genitourinary:  Negative for dysuria and hematuria.  Musculoskeletal:  Negative for arthralgias and back pain.  Skin:  Negative for color change and rash.  Neurological:  Negative for seizures and syncope.  All other systems reviewed and are negative.   Physical Exam Updated Vital Signs BP (!) 160/79 (BP Location: Right Arm)   Pulse 70   Temp 99.4 F (37.4 C) (Oral)   Resp 19   Ht 1.626 m (5\' 4" )   Wt 65.8 kg   SpO2 98%   BMI 24.89 kg/m  Physical Exam Vitals and nursing note reviewed.  Constitutional:      General: She is not in acute distress.    Appearance: Normal appearance. She is well-developed. She is not ill-appearing.  HENT:     Head: Normocephalic and atraumatic.  Eyes:     Extraocular Movements: Extraocular movements intact.     Conjunctiva/sclera: Conjunctivae normal.  Pupils: Pupils are equal, round, and reactive to light.  Cardiovascular:     Rate and Rhythm: Normal rate and regular rhythm.     Heart sounds: No murmur heard. Pulmonary:     Effort: Pulmonary effort is normal. No respiratory distress.     Breath sounds: Normal breath sounds. No wheezing or rales.  Abdominal:     General: There is no distension.     Palpations: Abdomen is soft.     Tenderness: There is no abdominal tenderness. There is no guarding.  Musculoskeletal:        General: No swelling.     Cervical back: Normal range of motion and neck supple.  Skin:    General: Skin is warm and dry.      Capillary Refill: Capillary refill takes less than 2 seconds.  Neurological:     General: No focal deficit present.     Mental Status: She is alert and oriented to person, place, and time.  Psychiatric:        Mood and Affect: Mood normal.     ED Results / Procedures / Treatments   Labs (all labs ordered are listed, but only abnormal results are displayed) Labs Reviewed - No data to display  EKG EKG Interpretation Date/Time:  Thursday May 30 2023 10:53:05 EST Ventricular Rate:  82 PR Interval:  140 QRS Duration:  68 QT Interval:  392 QTC Calculation: 457 R Axis:   57  Text Interpretation: Normal sinus rhythm Normal ECG When compared with ECG of 10-Apr-2023 14:23, No significant change was found Confirmed by Vanetta Mulders [phone]) on 05/30/2023 11:06:02 AM  Radiology DG Chest Portable 1 View  Result Date: 05/30/2023 CLINICAL DATA:  Short of breath.  Abdominal pain. EXAM: PORTABLE CHEST 1 VIEW COMPARISON:  04/20/2022 and older studies.  CT, 01/09/2023. FINDINGS: Cardiac silhouette normal in size.  No mediastinal or hilar masses. Lungs are hyperexpanded, but clear. No convincing pleural effusion. No pneumothorax. Skeletal structures are grossly intact. IMPRESSION: No active disease. Electronically Signed   By: Amie Portland M.D.   On: 05/30/2023 11:15    Procedures Procedures    Medications Ordered in ED Medications - No data to display  ED Course/ Medical Decision Making/ A&P                                 Medical Decision Making Amount and/or Complexity of Data Reviewed Radiology: ordered.   Chest x-ray negative.  Oxygen saturations good.  EKG without any acute findings and no significant changes.  Recommended further workup for the lower quadrant abdominal pain and the chest pain with labs.  The patient refuses states she is got a go home.  We do know the chest x-ray is good vital signs are good EKGs good and oxygen sats are good.  Recommend patient  follow back up with the primary care doctor.  Also the symptoms been ongoing for a month.  And currently asymptomatic.   Final Clinical Impression(s) / ED Diagnoses Final diagnoses:  Shortness of breath  Lower abdominal pain    Rx / DC Orders ED Discharge Orders     None         Vanetta Mulders, MD 05/30/23 1415

## 2023-06-02 DIAGNOSIS — J432 Centrilobular emphysema: Secondary | ICD-10-CM | POA: Diagnosis not present

## 2023-06-02 DIAGNOSIS — R079 Chest pain, unspecified: Secondary | ICD-10-CM | POA: Diagnosis not present

## 2023-06-02 DIAGNOSIS — I2510 Atherosclerotic heart disease of native coronary artery without angina pectoris: Secondary | ICD-10-CM | POA: Diagnosis not present

## 2023-06-02 DIAGNOSIS — Z20822 Contact with and (suspected) exposure to covid-19: Secondary | ICD-10-CM | POA: Diagnosis not present

## 2023-06-02 DIAGNOSIS — R0789 Other chest pain: Secondary | ICD-10-CM | POA: Diagnosis not present

## 2023-06-02 DIAGNOSIS — R0600 Dyspnea, unspecified: Secondary | ICD-10-CM | POA: Diagnosis not present

## 2023-06-02 DIAGNOSIS — R0602 Shortness of breath: Secondary | ICD-10-CM | POA: Diagnosis not present

## 2023-06-02 DIAGNOSIS — R059 Cough, unspecified: Secondary | ICD-10-CM | POA: Diagnosis not present

## 2023-06-02 DIAGNOSIS — R918 Other nonspecific abnormal finding of lung field: Secondary | ICD-10-CM | POA: Diagnosis not present

## 2023-06-04 DIAGNOSIS — E1369 Other specified diabetes mellitus with other specified complication: Secondary | ICD-10-CM | POA: Diagnosis not present

## 2023-06-04 DIAGNOSIS — M4802 Spinal stenosis, cervical region: Secondary | ICD-10-CM | POA: Diagnosis not present

## 2023-06-04 DIAGNOSIS — K589 Irritable bowel syndrome without diarrhea: Secondary | ICD-10-CM | POA: Diagnosis not present

## 2023-06-04 DIAGNOSIS — M5441 Lumbago with sciatica, right side: Secondary | ICD-10-CM | POA: Diagnosis not present

## 2023-06-04 DIAGNOSIS — E1169 Type 2 diabetes mellitus with other specified complication: Secondary | ICD-10-CM | POA: Diagnosis not present

## 2023-06-04 DIAGNOSIS — E1122 Type 2 diabetes mellitus with diabetic chronic kidney disease: Secondary | ICD-10-CM | POA: Diagnosis not present

## 2023-06-04 DIAGNOSIS — R079 Chest pain, unspecified: Secondary | ICD-10-CM | POA: Diagnosis not present

## 2023-06-04 DIAGNOSIS — R634 Abnormal weight loss: Secondary | ICD-10-CM | POA: Diagnosis not present

## 2023-06-04 DIAGNOSIS — N1831 Chronic kidney disease, stage 3a: Secondary | ICD-10-CM | POA: Diagnosis not present

## 2023-06-04 DIAGNOSIS — D75838 Other thrombocytosis: Secondary | ICD-10-CM | POA: Diagnosis not present

## 2023-06-04 DIAGNOSIS — I10 Essential (primary) hypertension: Secondary | ICD-10-CM | POA: Diagnosis not present

## 2023-07-10 DIAGNOSIS — E782 Mixed hyperlipidemia: Secondary | ICD-10-CM | POA: Diagnosis not present

## 2023-07-10 DIAGNOSIS — E1369 Other specified diabetes mellitus with other specified complication: Secondary | ICD-10-CM | POA: Diagnosis not present

## 2023-07-18 DIAGNOSIS — I129 Hypertensive chronic kidney disease with stage 1 through stage 4 chronic kidney disease, or unspecified chronic kidney disease: Secondary | ICD-10-CM | POA: Diagnosis not present

## 2023-07-18 DIAGNOSIS — R634 Abnormal weight loss: Secondary | ICD-10-CM | POA: Diagnosis not present

## 2023-07-18 DIAGNOSIS — E1369 Other specified diabetes mellitus with other specified complication: Secondary | ICD-10-CM | POA: Diagnosis not present

## 2023-07-18 DIAGNOSIS — D75838 Other thrombocytosis: Secondary | ICD-10-CM | POA: Diagnosis not present

## 2023-07-18 DIAGNOSIS — I10 Essential (primary) hypertension: Secondary | ICD-10-CM | POA: Diagnosis not present

## 2023-07-18 DIAGNOSIS — R079 Chest pain, unspecified: Secondary | ICD-10-CM | POA: Diagnosis not present

## 2023-07-18 DIAGNOSIS — N1831 Chronic kidney disease, stage 3a: Secondary | ICD-10-CM | POA: Diagnosis not present

## 2023-07-18 DIAGNOSIS — M4802 Spinal stenosis, cervical region: Secondary | ICD-10-CM | POA: Diagnosis not present

## 2023-07-18 DIAGNOSIS — K589 Irritable bowel syndrome without diarrhea: Secondary | ICD-10-CM | POA: Diagnosis not present

## 2023-07-18 DIAGNOSIS — M5441 Lumbago with sciatica, right side: Secondary | ICD-10-CM | POA: Diagnosis not present

## 2023-07-18 DIAGNOSIS — E1122 Type 2 diabetes mellitus with diabetic chronic kidney disease: Secondary | ICD-10-CM | POA: Diagnosis not present

## 2023-07-19 ENCOUNTER — Other Ambulatory Visit (HOSPITAL_COMMUNITY): Payer: Self-pay | Admitting: Internal Medicine

## 2023-07-19 DIAGNOSIS — M810 Age-related osteoporosis without current pathological fracture: Secondary | ICD-10-CM

## 2023-07-19 DIAGNOSIS — Z1231 Encounter for screening mammogram for malignant neoplasm of breast: Secondary | ICD-10-CM

## 2023-07-23 ENCOUNTER — Other Ambulatory Visit (HOSPITAL_COMMUNITY): Payer: Self-pay | Admitting: Radiology

## 2023-07-23 DIAGNOSIS — R0609 Other forms of dyspnea: Secondary | ICD-10-CM

## 2023-08-14 ENCOUNTER — Ambulatory Visit (HOSPITAL_COMMUNITY)
Admission: RE | Admit: 2023-08-14 | Discharge: 2023-08-14 | Disposition: A | Discharge status: Home / Self Care | Payer: Medicare Other | Source: Ambulatory Visit | Attending: Internal Medicine | Admitting: Internal Medicine

## 2023-08-14 DIAGNOSIS — Z780 Asymptomatic menopausal state: Secondary | ICD-10-CM | POA: Diagnosis not present

## 2023-08-14 DIAGNOSIS — M810 Age-related osteoporosis without current pathological fracture: Secondary | ICD-10-CM | POA: Insufficient documentation

## 2023-08-14 DIAGNOSIS — Z1231 Encounter for screening mammogram for malignant neoplasm of breast: Secondary | ICD-10-CM | POA: Diagnosis not present

## 2023-08-26 ENCOUNTER — Other Ambulatory Visit: Payer: Self-pay

## 2023-08-26 DIAGNOSIS — M810 Age-related osteoporosis without current pathological fracture: Secondary | ICD-10-CM | POA: Insufficient documentation

## 2023-08-29 ENCOUNTER — Telehealth: Payer: Self-pay

## 2023-08-29 NOTE — Telephone Encounter (Signed)
 Auth Submission: DENIED Site of care: Site of care: [facility] Payer: [employer] Medication & CPT/J Code(s) submitted: Prolia (Denosumab) [id number] Route of submission (phone, fax, portal): portal Phone # Fax # Auth type: Buy/Bill PB Units/visits requested: 60mg ,q61months Reference number: [id number]    Authorization has been DENIED because patient has to try both oral and injectable form of the preferred bisphosphonates. The denial letter is scanned in the media tab.

## 2023-09-02 DIAGNOSIS — Z7182 Exercise counseling: Secondary | ICD-10-CM | POA: Diagnosis not present

## 2023-09-02 DIAGNOSIS — I10 Essential (primary) hypertension: Secondary | ICD-10-CM | POA: Diagnosis not present

## 2023-09-02 DIAGNOSIS — M810 Age-related osteoporosis without current pathological fracture: Secondary | ICD-10-CM | POA: Diagnosis not present

## 2023-09-16 DIAGNOSIS — M810 Age-related osteoporosis without current pathological fracture: Secondary | ICD-10-CM | POA: Diagnosis not present

## 2023-10-22 DIAGNOSIS — H04123 Dry eye syndrome of bilateral lacrimal glands: Secondary | ICD-10-CM | POA: Diagnosis not present

## 2023-10-28 DIAGNOSIS — E782 Mixed hyperlipidemia: Secondary | ICD-10-CM | POA: Diagnosis not present

## 2023-10-28 DIAGNOSIS — E1369 Other specified diabetes mellitus with other specified complication: Secondary | ICD-10-CM | POA: Diagnosis not present

## 2023-11-01 DIAGNOSIS — D75838 Other thrombocytosis: Secondary | ICD-10-CM | POA: Diagnosis not present

## 2023-11-01 DIAGNOSIS — N1831 Chronic kidney disease, stage 3a: Secondary | ICD-10-CM | POA: Diagnosis not present

## 2023-11-01 DIAGNOSIS — I129 Hypertensive chronic kidney disease with stage 1 through stage 4 chronic kidney disease, or unspecified chronic kidney disease: Secondary | ICD-10-CM | POA: Diagnosis not present

## 2023-11-01 DIAGNOSIS — I10 Essential (primary) hypertension: Secondary | ICD-10-CM | POA: Diagnosis not present

## 2023-11-01 DIAGNOSIS — M810 Age-related osteoporosis without current pathological fracture: Secondary | ICD-10-CM | POA: Diagnosis not present

## 2023-11-01 DIAGNOSIS — M4802 Spinal stenosis, cervical region: Secondary | ICD-10-CM | POA: Diagnosis not present

## 2023-11-01 DIAGNOSIS — R634 Abnormal weight loss: Secondary | ICD-10-CM | POA: Diagnosis not present

## 2023-11-01 DIAGNOSIS — K589 Irritable bowel syndrome without diarrhea: Secondary | ICD-10-CM | POA: Diagnosis not present

## 2023-11-01 DIAGNOSIS — M5441 Lumbago with sciatica, right side: Secondary | ICD-10-CM | POA: Diagnosis not present

## 2023-11-01 DIAGNOSIS — E1369 Other specified diabetes mellitus with other specified complication: Secondary | ICD-10-CM | POA: Diagnosis not present

## 2023-11-01 DIAGNOSIS — E1122 Type 2 diabetes mellitus with diabetic chronic kidney disease: Secondary | ICD-10-CM | POA: Diagnosis not present

## 2023-11-25 ENCOUNTER — Emergency Department (HOSPITAL_COMMUNITY)

## 2023-11-25 ENCOUNTER — Other Ambulatory Visit: Payer: Self-pay

## 2023-11-25 ENCOUNTER — Encounter (HOSPITAL_COMMUNITY): Payer: Self-pay | Admitting: Emergency Medicine

## 2023-11-25 ENCOUNTER — Emergency Department (HOSPITAL_COMMUNITY)
Admission: EM | Admit: 2023-11-25 | Discharge: 2023-11-25 | Disposition: A | Discharge status: Home / Self Care | Attending: Emergency Medicine | Admitting: Emergency Medicine

## 2023-11-25 DIAGNOSIS — M4802 Spinal stenosis, cervical region: Secondary | ICD-10-CM | POA: Diagnosis not present

## 2023-11-25 DIAGNOSIS — I10 Essential (primary) hypertension: Secondary | ICD-10-CM | POA: Insufficient documentation

## 2023-11-25 DIAGNOSIS — R42 Dizziness and giddiness: Secondary | ICD-10-CM | POA: Diagnosis not present

## 2023-11-25 DIAGNOSIS — M5135 Other intervertebral disc degeneration, thoracolumbar region: Secondary | ICD-10-CM | POA: Diagnosis not present

## 2023-11-25 DIAGNOSIS — M47816 Spondylosis without myelopathy or radiculopathy, lumbar region: Secondary | ICD-10-CM | POA: Diagnosis not present

## 2023-11-25 DIAGNOSIS — M7918 Myalgia, other site: Secondary | ICD-10-CM | POA: Diagnosis not present

## 2023-11-25 DIAGNOSIS — M25551 Pain in right hip: Secondary | ICD-10-CM | POA: Diagnosis not present

## 2023-11-25 DIAGNOSIS — Z043 Encounter for examination and observation following other accident: Secondary | ICD-10-CM | POA: Diagnosis not present

## 2023-11-25 DIAGNOSIS — E876 Hypokalemia: Secondary | ICD-10-CM | POA: Insufficient documentation

## 2023-11-25 DIAGNOSIS — E119 Type 2 diabetes mellitus without complications: Secondary | ICD-10-CM | POA: Insufficient documentation

## 2023-11-25 DIAGNOSIS — R519 Headache, unspecified: Secondary | ICD-10-CM | POA: Insufficient documentation

## 2023-11-25 DIAGNOSIS — M5030 Other cervical disc degeneration, unspecified cervical region: Secondary | ICD-10-CM | POA: Diagnosis not present

## 2023-11-25 DIAGNOSIS — M549 Dorsalgia, unspecified: Secondary | ICD-10-CM | POA: Diagnosis not present

## 2023-11-25 DIAGNOSIS — Z79899 Other long term (current) drug therapy: Secondary | ICD-10-CM | POA: Diagnosis not present

## 2023-11-25 DIAGNOSIS — M51369 Other intervertebral disc degeneration, lumbar region without mention of lumbar back pain or lower extremity pain: Secondary | ICD-10-CM | POA: Diagnosis not present

## 2023-11-25 DIAGNOSIS — M791 Myalgia, unspecified site: Secondary | ICD-10-CM | POA: Diagnosis not present

## 2023-11-25 DIAGNOSIS — M4187 Other forms of scoliosis, lumbosacral region: Secondary | ICD-10-CM | POA: Diagnosis not present

## 2023-11-25 LAB — BASIC METABOLIC PANEL WITH GFR
Anion gap: 4 — ABNORMAL LOW (ref 5–15)
BUN: 10 mg/dL (ref 8–23)
CO2: 21 mmol/L — ABNORMAL LOW (ref 22–32)
Calcium: 8.7 mg/dL — ABNORMAL LOW (ref 8.9–10.3)
Chloride: 112 mmol/L — ABNORMAL HIGH (ref 98–111)
Creatinine, Ser: 1.02 mg/dL — ABNORMAL HIGH (ref 0.44–1.00)
GFR, Estimated: 55 mL/min — ABNORMAL LOW (ref >60)
Glucose, Bld: 91 mg/dL (ref 70–99)
Potassium: 3.3 mmol/L — ABNORMAL LOW (ref 3.5–5.1)
Sodium: 137 mmol/L (ref 135–145)

## 2023-11-25 LAB — CBC
HCT: 40.1 % (ref 36.0–46.0)
Hemoglobin: 13.2 g/dL (ref 12.0–15.0)
MCH: 30.9 pg (ref 26.0–34.0)
MCHC: 32.9 g/dL (ref 30.0–36.0)
MCV: 93.9 fL (ref 80.0–100.0)
Platelets: 355 10*3/uL (ref 150–400)
RBC: 4.27 MIL/uL (ref 3.87–5.11)
RDW: 13.9 % (ref 11.5–15.5)
WBC: 5.3 10*3/uL (ref 4.0–10.5)
nRBC: 0 % (ref 0.0–0.2)

## 2023-11-25 MED ORDER — KETOROLAC TROMETHAMINE 30 MG/ML IJ SOLN
15.0000 mg | Freq: Once | INTRAMUSCULAR | Status: AC
  Administered 2023-11-25: 15 mg via INTRAVENOUS
  Filled 2023-11-25: qty 1

## 2023-11-25 MED ORDER — PREDNISONE 10 MG PO TABS: ORAL_TABLET | ORAL | 0 refills | Status: AC

## 2023-11-25 MED ORDER — NEBIVOLOL HCL 10 MG PO TABS
10.0000 mg | ORAL_TABLET | Freq: Every day | ORAL | Status: DC
  Administered 2023-11-25: 10 mg via ORAL
  Filled 2023-11-25 (×2): qty 1

## 2023-11-25 MED ORDER — DEXAMETHASONE SODIUM PHOSPHATE 10 MG/ML IJ SOLN
10.0000 mg | Freq: Once | INTRAMUSCULAR | Status: AC
  Administered 2023-11-25: 10 mg via INTRAVENOUS
  Filled 2023-11-25: qty 1

## 2023-11-25 NOTE — ED Triage Notes (Signed)
 Pt reports missing a bench as she was attempting to sit down falling on 5/11, pt reports generalized pain "all over" since that time, no relief with Tylenol 

## 2023-11-25 NOTE — Discharge Instructions (Addendum)
 Your x-rays and CT scans today are negative for any acute injuries from your fall.  Since your pain responded well to the IV medicines we gave you here I am placing you on a prednisone  tapering medication for the next week, hopefully this will resolve your pain symptoms.  Start taking the prednisone  prescription tomorrow as you have received today's dose here through your IV line.  Your blood pressure has remained elevated here, we have given your missed dose of Bystolic , plan follow-up care with Dr. Del Favia within a week for repeat of your blood pressure to make sure it does not continue to be elevated.

## 2023-11-25 NOTE — ED Notes (Addendum)
 Pt states she takes BP meds and has not missed any doses
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
## 2023-12-06 DIAGNOSIS — W19XXXD Unspecified fall, subsequent encounter: Secondary | ICD-10-CM | POA: Diagnosis not present

## 2023-12-06 DIAGNOSIS — I10 Essential (primary) hypertension: Secondary | ICD-10-CM | POA: Diagnosis not present

## 2023-12-06 DIAGNOSIS — E441 Mild protein-calorie malnutrition: Secondary | ICD-10-CM | POA: Diagnosis not present

## 2023-12-06 DIAGNOSIS — E876 Hypokalemia: Secondary | ICD-10-CM | POA: Diagnosis not present

## 2023-12-06 DIAGNOSIS — I129 Hypertensive chronic kidney disease with stage 1 through stage 4 chronic kidney disease, or unspecified chronic kidney disease: Secondary | ICD-10-CM | POA: Diagnosis not present

## 2023-12-06 DIAGNOSIS — Z79899 Other long term (current) drug therapy: Secondary | ICD-10-CM | POA: Diagnosis not present

## 2023-12-06 DIAGNOSIS — R918 Other nonspecific abnormal finding of lung field: Secondary | ICD-10-CM | POA: Diagnosis not present

## 2023-12-06 DIAGNOSIS — M4802 Spinal stenosis, cervical region: Secondary | ICD-10-CM | POA: Diagnosis not present

## 2023-12-06 DIAGNOSIS — K219 Gastro-esophageal reflux disease without esophagitis: Secondary | ICD-10-CM | POA: Diagnosis not present

## 2023-12-06 DIAGNOSIS — M5441 Lumbago with sciatica, right side: Secondary | ICD-10-CM | POA: Diagnosis not present

## 2023-12-06 DIAGNOSIS — N1831 Chronic kidney disease, stage 3a: Secondary | ICD-10-CM | POA: Diagnosis not present

## 2024-02-28 DIAGNOSIS — E1369 Other specified diabetes mellitus with other specified complication: Secondary | ICD-10-CM | POA: Diagnosis not present

## 2024-02-28 DIAGNOSIS — E782 Mixed hyperlipidemia: Secondary | ICD-10-CM | POA: Diagnosis not present

## 2024-03-06 DIAGNOSIS — E1369 Other specified diabetes mellitus with other specified complication: Secondary | ICD-10-CM | POA: Diagnosis not present

## 2024-03-06 DIAGNOSIS — K219 Gastro-esophageal reflux disease without esophagitis: Secondary | ICD-10-CM | POA: Diagnosis not present

## 2024-03-06 DIAGNOSIS — Z23 Encounter for immunization: Secondary | ICD-10-CM | POA: Diagnosis not present

## 2024-03-06 DIAGNOSIS — E876 Hypokalemia: Secondary | ICD-10-CM | POA: Diagnosis not present

## 2024-03-06 DIAGNOSIS — M5441 Lumbago with sciatica, right side: Secondary | ICD-10-CM | POA: Diagnosis not present

## 2024-03-06 DIAGNOSIS — E1122 Type 2 diabetes mellitus with diabetic chronic kidney disease: Secondary | ICD-10-CM | POA: Diagnosis not present

## 2024-03-06 DIAGNOSIS — E782 Mixed hyperlipidemia: Secondary | ICD-10-CM | POA: Diagnosis not present

## 2024-03-06 DIAGNOSIS — I129 Hypertensive chronic kidney disease with stage 1 through stage 4 chronic kidney disease, or unspecified chronic kidney disease: Secondary | ICD-10-CM | POA: Diagnosis not present

## 2024-03-06 DIAGNOSIS — M4802 Spinal stenosis, cervical region: Secondary | ICD-10-CM | POA: Diagnosis not present

## 2024-03-06 DIAGNOSIS — M810 Age-related osteoporosis without current pathological fracture: Secondary | ICD-10-CM | POA: Diagnosis not present

## 2024-03-06 DIAGNOSIS — I10 Essential (primary) hypertension: Secondary | ICD-10-CM | POA: Diagnosis not present

## 2024-03-20 DIAGNOSIS — L299 Pruritus, unspecified: Secondary | ICD-10-CM | POA: Diagnosis not present

## 2024-03-20 DIAGNOSIS — R21 Rash and other nonspecific skin eruption: Secondary | ICD-10-CM | POA: Diagnosis not present
